# Patient Record
Sex: Female | Born: 1980 | Race: White | Hispanic: No | Marital: Married | Smoking: Never smoker
Health system: Southern US, Community
[De-identification: ages and names within clinical notes are randomized; demographics above are authoritative.]

## PROBLEM LIST (undated history)

## (undated) ENCOUNTER — Inpatient Hospital Stay (HOSPITAL_COMMUNITY): Payer: Self-pay

## (undated) DIAGNOSIS — N200 Calculus of kidney: Secondary | ICD-10-CM

## (undated) DIAGNOSIS — F419 Anxiety disorder, unspecified: Secondary | ICD-10-CM

## (undated) DIAGNOSIS — Z8619 Personal history of other infectious and parasitic diseases: Secondary | ICD-10-CM

## (undated) DIAGNOSIS — O139 Gestational [pregnancy-induced] hypertension without significant proteinuria, unspecified trimester: Secondary | ICD-10-CM

## (undated) DIAGNOSIS — D649 Anemia, unspecified: Secondary | ICD-10-CM

## (undated) DIAGNOSIS — Z87442 Personal history of urinary calculi: Secondary | ICD-10-CM

## (undated) DIAGNOSIS — F32A Depression, unspecified: Secondary | ICD-10-CM

## (undated) DIAGNOSIS — H9192 Unspecified hearing loss, left ear: Secondary | ICD-10-CM

## (undated) DIAGNOSIS — I209 Angina pectoris, unspecified: Secondary | ICD-10-CM

## (undated) DIAGNOSIS — U071 COVID-19: Secondary | ICD-10-CM

## (undated) DIAGNOSIS — O24419 Gestational diabetes mellitus in pregnancy, unspecified control: Secondary | ICD-10-CM

## (undated) HISTORY — DX: Anxiety disorder, unspecified: F41.9

## (undated) HISTORY — DX: Gestational (pregnancy-induced) hypertension without significant proteinuria, unspecified trimester: O13.9

## (undated) HISTORY — PX: WISDOM TOOTH EXTRACTION: SHX21

## (undated) HISTORY — DX: Calculus of kidney: N20.0

## (undated) HISTORY — DX: Personal history of other infectious and parasitic diseases: Z86.19

## (undated) HISTORY — DX: Gestational diabetes mellitus in pregnancy, unspecified control: O24.419

---

## 2002-09-29 ENCOUNTER — Emergency Department (HOSPITAL_COMMUNITY): Admission: EM | Admit: 2002-09-29 | Discharge: 2002-09-29 | Payer: Self-pay | Admitting: Emergency Medicine

## 2002-09-29 ENCOUNTER — Encounter: Payer: Self-pay | Admitting: Emergency Medicine

## 2002-12-27 ENCOUNTER — Emergency Department (HOSPITAL_COMMUNITY): Admission: EM | Admit: 2002-12-27 | Discharge: 2002-12-27 | Payer: Self-pay | Admitting: Emergency Medicine

## 2003-04-04 ENCOUNTER — Emergency Department (HOSPITAL_COMMUNITY): Admission: EM | Admit: 2003-04-04 | Discharge: 2003-04-04 | Payer: Self-pay | Admitting: Emergency Medicine

## 2003-04-04 ENCOUNTER — Encounter: Payer: Self-pay | Admitting: Emergency Medicine

## 2004-08-01 ENCOUNTER — Inpatient Hospital Stay (HOSPITAL_COMMUNITY): Admission: AD | Admit: 2004-08-01 | Discharge: 2004-08-03 | Payer: Self-pay | Admitting: Gynecology

## 2004-10-31 ENCOUNTER — Ambulatory Visit (HOSPITAL_COMMUNITY): Admission: RE | Admit: 2004-10-31 | Discharge: 2004-10-31 | Payer: Self-pay | Admitting: Gynecology

## 2005-01-06 ENCOUNTER — Encounter: Admission: RE | Admit: 2005-01-06 | Discharge: 2005-01-06 | Payer: Self-pay | Admitting: Gynecology

## 2005-01-29 ENCOUNTER — Observation Stay (HOSPITAL_COMMUNITY): Admission: AD | Admit: 2005-01-29 | Discharge: 2005-01-29 | Payer: Self-pay | Admitting: Gynecology

## 2005-01-29 ENCOUNTER — Ambulatory Visit (HOSPITAL_COMMUNITY): Admission: RE | Admit: 2005-01-29 | Discharge: 2005-01-29 | Payer: Self-pay | Admitting: Gynecology

## 2005-01-30 ENCOUNTER — Inpatient Hospital Stay (HOSPITAL_COMMUNITY): Admission: AD | Admit: 2005-01-30 | Discharge: 2005-02-02 | Payer: Self-pay | Admitting: Gynecology

## 2006-08-19 ENCOUNTER — Ambulatory Visit: Payer: Self-pay | Admitting: Obstetrics & Gynecology

## 2006-08-23 ENCOUNTER — Ambulatory Visit (HOSPITAL_COMMUNITY): Admission: RE | Admit: 2006-08-23 | Discharge: 2006-08-23 | Payer: Self-pay | Admitting: Gynecology

## 2006-09-06 ENCOUNTER — Ambulatory Visit: Payer: Self-pay | Admitting: Gynecology

## 2006-09-13 ENCOUNTER — Encounter (INDEPENDENT_AMBULATORY_CARE_PROVIDER_SITE_OTHER): Payer: Self-pay | Admitting: Gynecology

## 2006-09-13 ENCOUNTER — Ambulatory Visit: Payer: Self-pay | Admitting: Gynecology

## 2006-10-05 ENCOUNTER — Ambulatory Visit: Payer: Self-pay | Admitting: Family Medicine

## 2006-10-19 DIAGNOSIS — O24419 Gestational diabetes mellitus in pregnancy, unspecified control: Secondary | ICD-10-CM

## 2006-10-19 HISTORY — DX: Gestational diabetes mellitus in pregnancy, unspecified control: O24.419

## 2006-11-02 ENCOUNTER — Ambulatory Visit: Payer: Self-pay | Admitting: Family Medicine

## 2006-11-16 ENCOUNTER — Ambulatory Visit: Payer: Self-pay | Admitting: Family Medicine

## 2006-11-22 ENCOUNTER — Ambulatory Visit: Payer: Self-pay | Admitting: Obstetrics & Gynecology

## 2006-11-23 ENCOUNTER — Ambulatory Visit (HOSPITAL_COMMUNITY): Admission: RE | Admit: 2006-11-23 | Discharge: 2006-11-23 | Payer: Self-pay | Admitting: Gynecology

## 2006-12-01 ENCOUNTER — Ambulatory Visit: Payer: Self-pay | Admitting: Gynecology

## 2006-12-21 ENCOUNTER — Ambulatory Visit: Payer: Self-pay | Admitting: Family Medicine

## 2006-12-28 ENCOUNTER — Ambulatory Visit: Payer: Self-pay | Admitting: Family Medicine

## 2007-01-11 ENCOUNTER — Ambulatory Visit: Payer: Self-pay | Admitting: Family Medicine

## 2007-02-03 ENCOUNTER — Ambulatory Visit: Payer: Self-pay | Admitting: Family Medicine

## 2007-02-08 ENCOUNTER — Ambulatory Visit (HOSPITAL_COMMUNITY): Admission: RE | Admit: 2007-02-08 | Discharge: 2007-02-08 | Payer: Self-pay | Admitting: Family Medicine

## 2007-02-10 ENCOUNTER — Ambulatory Visit: Payer: Self-pay | Admitting: Obstetrics & Gynecology

## 2007-02-21 ENCOUNTER — Ambulatory Visit: Payer: Self-pay | Admitting: Gynecology

## 2007-02-21 ENCOUNTER — Ambulatory Visit: Payer: Self-pay | Admitting: Obstetrics and Gynecology

## 2007-03-01 ENCOUNTER — Ambulatory Visit: Payer: Self-pay | Admitting: Family Medicine

## 2007-03-04 ENCOUNTER — Ambulatory Visit: Payer: Self-pay | Admitting: Family Medicine

## 2007-03-08 ENCOUNTER — Ambulatory Visit: Payer: Self-pay | Admitting: Family Medicine

## 2007-03-11 ENCOUNTER — Inpatient Hospital Stay (HOSPITAL_COMMUNITY): Admission: AD | Admit: 2007-03-11 | Discharge: 2007-03-11 | Payer: Self-pay | Admitting: Gynecology

## 2007-03-11 ENCOUNTER — Ambulatory Visit: Payer: Self-pay | Admitting: Obstetrics and Gynecology

## 2007-03-15 ENCOUNTER — Ambulatory Visit: Payer: Self-pay | Admitting: Family Medicine

## 2007-03-18 ENCOUNTER — Ambulatory Visit: Payer: Self-pay | Admitting: Obstetrics and Gynecology

## 2007-03-22 ENCOUNTER — Ambulatory Visit: Payer: Self-pay | Admitting: Family Medicine

## 2007-03-25 ENCOUNTER — Ambulatory Visit: Payer: Self-pay | Admitting: Obstetrics and Gynecology

## 2007-03-29 ENCOUNTER — Ambulatory Visit: Payer: Self-pay | Admitting: Family Medicine

## 2007-03-31 ENCOUNTER — Ambulatory Visit: Payer: Self-pay | Admitting: Family Medicine

## 2007-04-01 ENCOUNTER — Ambulatory Visit (HOSPITAL_COMMUNITY): Admission: RE | Admit: 2007-04-01 | Discharge: 2007-04-01 | Payer: Self-pay | Admitting: Gynecology

## 2007-04-01 ENCOUNTER — Ambulatory Visit: Payer: Self-pay | Admitting: Obstetrics & Gynecology

## 2007-04-04 ENCOUNTER — Ambulatory Visit: Payer: Self-pay | Admitting: Gynecology

## 2007-04-08 ENCOUNTER — Ambulatory Visit (HOSPITAL_COMMUNITY): Admission: RE | Admit: 2007-04-08 | Discharge: 2007-04-08 | Payer: Self-pay | Admitting: Family Medicine

## 2007-04-12 ENCOUNTER — Ambulatory Visit: Payer: Self-pay | Admitting: Family Medicine

## 2007-04-15 ENCOUNTER — Inpatient Hospital Stay (HOSPITAL_COMMUNITY): Admission: AD | Admit: 2007-04-15 | Discharge: 2007-04-16 | Payer: Self-pay | Admitting: Family Medicine

## 2007-04-18 ENCOUNTER — Ambulatory Visit (HOSPITAL_COMMUNITY): Admission: RE | Admit: 2007-04-18 | Discharge: 2007-04-18 | Payer: Self-pay | Admitting: Family Medicine

## 2007-04-19 ENCOUNTER — Inpatient Hospital Stay (HOSPITAL_COMMUNITY): Admission: AD | Admit: 2007-04-19 | Discharge: 2007-04-22 | Payer: Self-pay | Admitting: Family Medicine

## 2007-04-19 ENCOUNTER — Ambulatory Visit: Payer: Self-pay | Admitting: Family Medicine

## 2007-04-19 ENCOUNTER — Ambulatory Visit: Payer: Self-pay | Admitting: Physician Assistant

## 2007-04-25 ENCOUNTER — Encounter: Admission: RE | Admit: 2007-04-25 | Discharge: 2007-05-25 | Payer: Self-pay | Admitting: Family Medicine

## 2007-05-26 ENCOUNTER — Encounter: Admission: RE | Admit: 2007-05-26 | Discharge: 2007-06-25 | Payer: Self-pay | Admitting: Family Medicine

## 2007-05-31 ENCOUNTER — Ambulatory Visit: Payer: Self-pay | Admitting: Family Medicine

## 2007-06-26 ENCOUNTER — Encounter: Admission: RE | Admit: 2007-06-26 | Discharge: 2007-07-08 | Payer: Self-pay | Admitting: Family Medicine

## 2007-07-28 ENCOUNTER — Emergency Department (HOSPITAL_COMMUNITY): Admission: EM | Admit: 2007-07-28 | Discharge: 2007-07-28 | Payer: Self-pay | Admitting: Emergency Medicine

## 2007-07-28 ENCOUNTER — Emergency Department (HOSPITAL_COMMUNITY): Admission: EM | Admit: 2007-07-28 | Discharge: 2007-07-29 | Payer: Self-pay | Admitting: Emergency Medicine

## 2007-07-31 ENCOUNTER — Emergency Department (HOSPITAL_COMMUNITY): Admission: EM | Admit: 2007-07-31 | Discharge: 2007-08-01 | Payer: Self-pay | Admitting: Emergency Medicine

## 2007-08-01 ENCOUNTER — Ambulatory Visit: Payer: Self-pay | Admitting: Gastroenterology

## 2007-08-02 ENCOUNTER — Ambulatory Visit: Payer: Self-pay | Admitting: Gastroenterology

## 2007-08-08 ENCOUNTER — Ambulatory Visit (HOSPITAL_COMMUNITY): Admission: RE | Admit: 2007-08-08 | Discharge: 2007-08-08 | Payer: Self-pay | Admitting: Gastroenterology

## 2007-12-23 DIAGNOSIS — K219 Gastro-esophageal reflux disease without esophagitis: Secondary | ICD-10-CM | POA: Insufficient documentation

## 2007-12-23 DIAGNOSIS — K8020 Calculus of gallbladder without cholecystitis without obstruction: Secondary | ICD-10-CM | POA: Insufficient documentation

## 2007-12-23 DIAGNOSIS — I10 Essential (primary) hypertension: Secondary | ICD-10-CM | POA: Insufficient documentation

## 2007-12-23 DIAGNOSIS — E669 Obesity, unspecified: Secondary | ICD-10-CM

## 2007-12-23 DIAGNOSIS — N200 Calculus of kidney: Secondary | ICD-10-CM | POA: Insufficient documentation

## 2007-12-23 DIAGNOSIS — N281 Cyst of kidney, acquired: Secondary | ICD-10-CM | POA: Insufficient documentation

## 2007-12-23 DIAGNOSIS — F411 Generalized anxiety disorder: Secondary | ICD-10-CM | POA: Insufficient documentation

## 2007-12-28 ENCOUNTER — Ambulatory Visit: Payer: Self-pay | Admitting: Gynecology

## 2008-01-03 ENCOUNTER — Ambulatory Visit (HOSPITAL_COMMUNITY): Admission: RE | Admit: 2008-01-03 | Discharge: 2008-01-03 | Payer: Self-pay | Admitting: Gynecology

## 2008-01-10 ENCOUNTER — Encounter: Payer: Self-pay | Admitting: Family Medicine

## 2008-01-10 ENCOUNTER — Ambulatory Visit: Payer: Self-pay | Admitting: Family Medicine

## 2008-01-12 ENCOUNTER — Ambulatory Visit: Payer: Self-pay | Admitting: Nurse Practitioner

## 2008-01-12 ENCOUNTER — Ambulatory Visit: Payer: Self-pay | Admitting: Gynecology

## 2008-01-12 ENCOUNTER — Observation Stay (HOSPITAL_COMMUNITY): Admission: AD | Admit: 2008-01-12 | Discharge: 2008-01-13 | Payer: Self-pay | Admitting: Obstetrics & Gynecology

## 2008-01-12 ENCOUNTER — Inpatient Hospital Stay (HOSPITAL_COMMUNITY): Admission: AD | Admit: 2008-01-12 | Discharge: 2008-01-12 | Payer: Self-pay | Admitting: Obstetrics and Gynecology

## 2008-01-13 ENCOUNTER — Encounter: Payer: Self-pay | Admitting: Gynecology

## 2008-01-17 ENCOUNTER — Ambulatory Visit (HOSPITAL_COMMUNITY): Admission: RE | Admit: 2008-01-17 | Discharge: 2008-01-17 | Payer: Self-pay | Admitting: Family Medicine

## 2008-01-24 ENCOUNTER — Ambulatory Visit: Payer: Self-pay | Admitting: Family Medicine

## 2008-02-02 ENCOUNTER — Ambulatory Visit (HOSPITAL_COMMUNITY): Admission: RE | Admit: 2008-02-02 | Discharge: 2008-02-02 | Payer: Self-pay | Admitting: Gynecology

## 2008-02-06 ENCOUNTER — Ambulatory Visit: Payer: Self-pay | Admitting: Gynecology

## 2008-02-27 ENCOUNTER — Ambulatory Visit: Payer: Self-pay | Admitting: Gynecology

## 2008-04-03 ENCOUNTER — Ambulatory Visit: Payer: Self-pay | Admitting: Obstetrics and Gynecology

## 2008-05-21 ENCOUNTER — Ambulatory Visit: Payer: Self-pay | Admitting: Family Medicine

## 2008-06-05 ENCOUNTER — Ambulatory Visit (HOSPITAL_COMMUNITY): Admission: RE | Admit: 2008-06-05 | Discharge: 2008-06-05 | Payer: Self-pay | Admitting: Family Medicine

## 2008-06-20 ENCOUNTER — Ambulatory Visit: Payer: Self-pay | Admitting: Obstetrics and Gynecology

## 2008-07-13 ENCOUNTER — Ambulatory Visit (HOSPITAL_COMMUNITY): Admission: RE | Admit: 2008-07-13 | Discharge: 2008-07-13 | Payer: Self-pay | Admitting: Family Medicine

## 2008-07-16 ENCOUNTER — Ambulatory Visit: Payer: Self-pay | Admitting: Family Medicine

## 2008-08-06 ENCOUNTER — Ambulatory Visit: Payer: Self-pay | Admitting: Family Medicine

## 2008-08-06 LAB — CONVERTED CEMR LAB: TSH: 0.902 microintl units/mL (ref 0.350–4.50)

## 2008-08-13 ENCOUNTER — Ambulatory Visit (HOSPITAL_COMMUNITY): Admission: RE | Admit: 2008-08-13 | Discharge: 2008-08-13 | Payer: Self-pay | Admitting: Family Medicine

## 2008-08-28 ENCOUNTER — Ambulatory Visit: Payer: Self-pay | Admitting: Family Medicine

## 2008-09-11 ENCOUNTER — Ambulatory Visit (HOSPITAL_COMMUNITY): Admission: RE | Admit: 2008-09-11 | Discharge: 2008-09-11 | Payer: Self-pay | Admitting: Obstetrics & Gynecology

## 2008-09-19 ENCOUNTER — Encounter: Payer: Self-pay | Admitting: Family Medicine

## 2008-09-19 ENCOUNTER — Ambulatory Visit: Payer: Self-pay | Admitting: Obstetrics and Gynecology

## 2008-09-19 LAB — CONVERTED CEMR LAB
HCT: 35.7 % — ABNORMAL LOW (ref 36.0–46.0)
Hemoglobin: 11.7 g/dL — ABNORMAL LOW (ref 12.0–15.0)
MCHC: 32.8 g/dL (ref 30.0–36.0)
MCV: 89.9 fL (ref 78.0–100.0)
Platelets: 201 10*3/uL (ref 150–400)
RBC: 3.97 M/uL (ref 3.87–5.11)
RDW: 13.6 % (ref 11.5–15.5)
WBC: 8.6 10*3/uL (ref 4.0–10.5)

## 2008-10-04 ENCOUNTER — Ambulatory Visit: Payer: Self-pay | Admitting: Family Medicine

## 2008-10-09 ENCOUNTER — Ambulatory Visit (HOSPITAL_COMMUNITY): Admission: RE | Admit: 2008-10-09 | Discharge: 2008-10-09 | Payer: Self-pay | Admitting: Obstetrics & Gynecology

## 2008-10-09 ENCOUNTER — Ambulatory Visit: Payer: Self-pay | Admitting: Obstetrics and Gynecology

## 2008-10-15 ENCOUNTER — Ambulatory Visit: Payer: Self-pay | Admitting: Family Medicine

## 2008-10-18 ENCOUNTER — Ambulatory Visit (HOSPITAL_COMMUNITY): Admission: RE | Admit: 2008-10-18 | Discharge: 2008-10-18 | Payer: Self-pay | Admitting: Family Medicine

## 2008-10-22 ENCOUNTER — Ambulatory Visit: Payer: Self-pay | Admitting: Obstetrics & Gynecology

## 2008-10-25 ENCOUNTER — Ambulatory Visit (HOSPITAL_COMMUNITY): Admission: RE | Admit: 2008-10-25 | Discharge: 2008-10-25 | Payer: Self-pay | Admitting: Family Medicine

## 2008-10-29 ENCOUNTER — Ambulatory Visit: Payer: Self-pay | Admitting: Obstetrics & Gynecology

## 2008-11-01 ENCOUNTER — Ambulatory Visit (HOSPITAL_COMMUNITY): Admission: RE | Admit: 2008-11-01 | Discharge: 2008-11-01 | Payer: Self-pay | Admitting: Family Medicine

## 2008-11-06 ENCOUNTER — Ambulatory Visit: Payer: Self-pay | Admitting: Obstetrics and Gynecology

## 2008-11-09 ENCOUNTER — Ambulatory Visit (HOSPITAL_COMMUNITY): Admission: RE | Admit: 2008-11-09 | Discharge: 2008-11-09 | Payer: Self-pay | Admitting: Family Medicine

## 2008-11-12 ENCOUNTER — Ambulatory Visit: Payer: Self-pay | Admitting: Obstetrics & Gynecology

## 2008-11-12 ENCOUNTER — Encounter: Payer: Self-pay | Admitting: Family Medicine

## 2008-11-12 ENCOUNTER — Inpatient Hospital Stay (HOSPITAL_COMMUNITY): Admission: AD | Admit: 2008-11-12 | Discharge: 2008-11-12 | Payer: Self-pay | Admitting: Obstetrics & Gynecology

## 2008-11-12 LAB — CONVERTED CEMR LAB
Chlamydia, DNA Probe: NEGATIVE
GC Probe Amp, Genital: NEGATIVE

## 2008-11-13 ENCOUNTER — Encounter: Payer: Self-pay | Admitting: Family Medicine

## 2008-11-15 ENCOUNTER — Ambulatory Visit (HOSPITAL_COMMUNITY): Admission: RE | Admit: 2008-11-15 | Discharge: 2008-11-15 | Payer: Self-pay | Admitting: Family Medicine

## 2008-11-16 ENCOUNTER — Ambulatory Visit: Payer: Self-pay | Admitting: Family Medicine

## 2008-11-19 ENCOUNTER — Ambulatory Visit: Payer: Self-pay | Admitting: Obstetrics and Gynecology

## 2008-11-20 ENCOUNTER — Encounter: Payer: Self-pay | Admitting: Family Medicine

## 2008-11-22 ENCOUNTER — Ambulatory Visit (HOSPITAL_COMMUNITY): Admission: RE | Admit: 2008-11-22 | Discharge: 2008-11-22 | Payer: Self-pay | Admitting: Family Medicine

## 2008-11-26 ENCOUNTER — Ambulatory Visit: Payer: Self-pay | Admitting: Family Medicine

## 2008-11-27 ENCOUNTER — Encounter: Payer: Self-pay | Admitting: Family Medicine

## 2008-11-27 LAB — CONVERTED CEMR LAB
ALT: 38 units/L — ABNORMAL HIGH (ref 0–35)
AST: 27 units/L (ref 0–37)
Albumin: 3.5 g/dL (ref 3.5–5.2)
Alkaline Phosphatase: 74 units/L (ref 39–117)
BUN: 9 mg/dL (ref 6–23)
CO2: 22 meq/L (ref 19–32)
Calcium: 9.3 mg/dL (ref 8.4–10.5)
Chloride: 108 meq/L (ref 96–112)
Creatinine, Ser: 0.6 mg/dL (ref 0.40–1.20)
Glucose, Bld: 67 mg/dL — ABNORMAL LOW (ref 70–99)
HCT: 35.2 % — ABNORMAL LOW (ref 36.0–46.0)
Hemoglobin: 11.1 g/dL — ABNORMAL LOW (ref 12.0–15.0)
MCHC: 31.5 g/dL (ref 30.0–36.0)
MCV: 88.4 fL (ref 78.0–100.0)
Platelets: 215 10*3/uL (ref 150–400)
Potassium: 4.3 meq/L (ref 3.5–5.3)
RBC: 3.98 M/uL (ref 3.87–5.11)
RDW: 13.8 % (ref 11.5–15.5)
Sodium: 140 meq/L (ref 135–145)
Total Bilirubin: 0.3 mg/dL (ref 0.3–1.2)
Total Protein: 5.9 g/dL — ABNORMAL LOW (ref 6.0–8.3)
WBC: 8.5 10*3/uL (ref 4.0–10.5)

## 2008-11-28 ENCOUNTER — Encounter: Payer: Self-pay | Admitting: Family Medicine

## 2008-11-28 LAB — CONVERTED CEMR LAB
Collection Interval-CRCL: 24 hr
Creatinine 24 HR UR: 1923 mg/24hr — ABNORMAL HIGH (ref 700–1800)
Creatinine Clearance: 223 mL/min — ABNORMAL HIGH (ref 75–115)
Creatinine, Urine: 153.8 mg/dL
Protein, Ur: 75 mg/24hr (ref 50–100)

## 2008-11-29 ENCOUNTER — Inpatient Hospital Stay (HOSPITAL_COMMUNITY): Admission: AD | Admit: 2008-11-29 | Discharge: 2008-11-29 | Payer: Self-pay | Admitting: Obstetrics & Gynecology

## 2008-11-29 ENCOUNTER — Ambulatory Visit: Payer: Self-pay | Admitting: Advanced Practice Midwife

## 2008-12-03 ENCOUNTER — Ambulatory Visit: Payer: Self-pay | Admitting: Obstetrics & Gynecology

## 2008-12-05 ENCOUNTER — Inpatient Hospital Stay (HOSPITAL_COMMUNITY): Admission: RE | Admit: 2008-12-05 | Discharge: 2008-12-06 | Payer: Self-pay | Admitting: Obstetrics & Gynecology

## 2008-12-05 ENCOUNTER — Ambulatory Visit: Payer: Self-pay | Admitting: Physician Assistant

## 2009-02-05 ENCOUNTER — Encounter: Payer: Self-pay | Admitting: Obstetrics & Gynecology

## 2009-02-05 ENCOUNTER — Ambulatory Visit: Payer: Self-pay | Admitting: Obstetrics & Gynecology

## 2009-02-18 ENCOUNTER — Ambulatory Visit: Payer: Self-pay | Admitting: Obstetrics & Gynecology

## 2009-02-18 ENCOUNTER — Encounter: Payer: Self-pay | Admitting: Family Medicine

## 2009-02-18 LAB — CONVERTED CEMR LAB
Glucose, 2 hour: 93 mg/dL (ref 70–139)
Glucose, Fasting: 90 mg/dL (ref 70–99)

## 2009-08-26 ENCOUNTER — Ambulatory Visit: Payer: Self-pay | Admitting: Family Medicine

## 2010-06-15 ENCOUNTER — Emergency Department (HOSPITAL_COMMUNITY): Admission: EM | Admit: 2010-06-15 | Discharge: 2010-06-15 | Payer: Self-pay | Admitting: Emergency Medicine

## 2010-10-28 ENCOUNTER — Emergency Department (HOSPITAL_COMMUNITY)
Admission: EM | Admit: 2010-10-28 | Discharge: 2010-10-28 | Payer: Self-pay | Source: Home / Self Care | Admitting: Emergency Medicine

## 2010-11-03 LAB — URINALYSIS, ROUTINE W REFLEX MICROSCOPIC
Bilirubin Urine: NEGATIVE
Ketones, ur: NEGATIVE mg/dL
Leukocytes, UA: NEGATIVE
Nitrite: NEGATIVE
Protein, ur: NEGATIVE mg/dL
Specific Gravity, Urine: 1.021 (ref 1.005–1.030)
Urine Glucose, Fasting: NEGATIVE mg/dL
Urobilinogen, UA: 0.2 mg/dL (ref 0.0–1.0)
pH: 6 (ref 5.0–8.0)

## 2010-11-03 LAB — URINE MICROSCOPIC-ADD ON

## 2010-11-09 ENCOUNTER — Encounter: Payer: Self-pay | Admitting: Family Medicine

## 2010-11-09 ENCOUNTER — Encounter: Payer: Self-pay | Admitting: Gynecology

## 2010-12-17 ENCOUNTER — Ambulatory Visit (INDEPENDENT_AMBULATORY_CARE_PROVIDER_SITE_OTHER): Payer: BC Managed Care – PPO | Admitting: Obstetrics & Gynecology

## 2010-12-17 ENCOUNTER — Other Ambulatory Visit (HOSPITAL_COMMUNITY)
Admission: RE | Admit: 2010-12-17 | Discharge: 2010-12-17 | Disposition: A | Discharge status: Home / Self Care | Payer: BC Managed Care – PPO | Source: Ambulatory Visit | Attending: Family Medicine | Admitting: Family Medicine

## 2010-12-17 DIAGNOSIS — Z1272 Encounter for screening for malignant neoplasm of vagina: Secondary | ICD-10-CM

## 2010-12-17 DIAGNOSIS — Z01419 Encounter for gynecological examination (general) (routine) without abnormal findings: Secondary | ICD-10-CM

## 2010-12-17 DIAGNOSIS — N912 Amenorrhea, unspecified: Secondary | ICD-10-CM

## 2011-01-02 LAB — URINALYSIS, ROUTINE W REFLEX MICROSCOPIC
Bilirubin Urine: NEGATIVE
Glucose, UA: NEGATIVE mg/dL
Ketones, ur: NEGATIVE mg/dL
Leukocytes, UA: NEGATIVE
Nitrite: NEGATIVE
Protein, ur: NEGATIVE mg/dL
Specific Gravity, Urine: 1.01 (ref 1.005–1.030)
Urobilinogen, UA: 0.2 mg/dL (ref 0.0–1.0)
pH: 6 (ref 5.0–8.0)

## 2011-01-02 LAB — URINE MICROSCOPIC-ADD ON

## 2011-01-02 LAB — POCT PREGNANCY, URINE: Preg Test, Ur: NEGATIVE

## 2011-01-09 NOTE — Assessment & Plan Note (Signed)
NAMEALEXIUS, Brown NO.:  [id number]  MEDICAL RECORD NO.:  [id number]           PATIENT TYPE:  LOCATION:  [facility] at [facility]           FACILITY:  PHYSICIAN:  Tinnie Gens, MD        DATE OF BIRTH:  Dec 09, 1980  DATE OF SERVICE:  12/17/2010                                 CLINIC NOTE  HISTORY:  [name] comes in today for yearly exam.  She complains of a superficial breast lesion that seems to have gotten bigger and then went away, and this came up again and did drain some serous drainage, this was in there for approximately 2 months.  She thinks it is really like a boil.  No other significant complaints.  She did have nephrolithiasis in the past.  In the past several months, she passed 13 kidney stones.  She changed her diet and is working on weight loss and exercise.  She reports her anxiety level is high.  She has no real sadness.  She feels like her life is a bit out of control.  She is presently homeschooling her oldest child and has 2 other young children at home as well.  She is sort of undecided about more kids at this point and has time to start think about that.  She is presently on no medications and her blood pressure is doing well.  PAST MEDICAL HISTORY:  Significant for hypertension.  PAST SURGICAL HISTORY:  She had a C-section in the past.  OBSTETRICAL HISTORY:  She is a G4, P-2-1-1-3, who had a C-section for preeclampsia with her first baby at 1 weeks and then VBAC x2 her last two pregnancies.  Pregnancies were complicated by gestational diabetes and hypertension.  GYNECOLOGICAL HISTORY:  Essentially negative.  No history of abnormal Pap.  MEDICATIONS:  None.  ALLERGIES:  PHENERGAN, IV CONTRAST.  No latex allergy.  FAMILY HISTORY:  Essentially negative.  SOCIAL HISTORY:  No tobacco, alcohol, or drug use.  She was a previous ER nurse.  REVIEW OF SYSTEMS:  The 14-point review of systems reviewed.  She denies headache, vision  changes, nausea, vomiting, diarrhea, constipation, chest pain, shortness of breath, swelling of feet or ankles.  For the rest, please see HPI.  PHYSICAL EXAMINATION:  VITAL SIGNS:  Vitals are as noted in the chart. Blood pressure is 126/91. GENERAL:  She is an obese female, in no acute distress. HEENT:  Normocephalic, atraumatic.  Sclerae anicteric. NECK:  Supple.  Normal thyroid. LUNGS:  Clear bilaterally. CV:  Regular rate and rhythm, without gallops, rubs, or murmurs. ABDOMEN:  Soft, nontender, nondistended. EXTREMITIES:  No cyanosis, clubbing, or edema.  2+ distal pulses. BREASTS:  Symmetric with everted nipples.  No masses.  No supraclavicular or axillary adenopathy.  On the right breast, there is a healing what looked to be a boil, but there is no mass underneath it any more. GU:  Normal external female genitalia.  BUS normal.  Vagina is pink and rugated.  Cervix is parous without lesion.  Uterus is small, anteverted. No adnexal mass or tenderness, although exam is limited by body habitus.  IMPRESSION:  Gynecological exam with Pap.  PLAN:  Follow up as needed or  in a year.          ______________________________ Tinnie Gens, MD    TP/MEDQ  D:  12/17/2010  T:  12/18/2010  Job:  [id number]

## 2011-02-02 LAB — URINALYSIS, ROUTINE W REFLEX MICROSCOPIC
Bilirubin Urine: NEGATIVE
Glucose, UA: NEGATIVE mg/dL
Hgb urine dipstick: NEGATIVE
Ketones, ur: NEGATIVE mg/dL
Nitrite: NEGATIVE
Protein, ur: NEGATIVE mg/dL
Specific Gravity, Urine: >1.03 — ABNORMAL HIGH (ref 1.005–1.030)
Urobilinogen, UA: 0.2 mg/dL (ref 0.0–1.0)
pH: 6 (ref 5.0–8.0)

## 2011-02-02 LAB — COMPREHENSIVE METABOLIC PANEL
ALT: 38 U/L — ABNORMAL HIGH (ref 0–35)
AST: 24 U/L (ref 0–37)
Albumin: 2.8 g/dL — ABNORMAL LOW (ref 3.5–5.2)
Alkaline Phosphatase: 64 U/L (ref 39–117)
BUN: 8 mg/dL (ref 6–23)
CO2: 24 mEq/L (ref 19–32)
Calcium: 9 mg/dL (ref 8.4–10.5)
Chloride: 107 mEq/L (ref 96–112)
Creatinine, Ser: 0.55 mg/dL (ref 0.4–1.2)
GFR calc Af Amer: >60 mL/min (ref >60)
GFR calc non Af Amer: >60 mL/min (ref >60)
Glucose, Bld: 86 mg/dL (ref 70–99)
Potassium: 3.8 mEq/L (ref 3.5–5.1)
Sodium: 138 mEq/L (ref 135–145)
Total Bilirubin: 0.4 mg/dL (ref 0.3–1.2)
Total Protein: 6.1 g/dL (ref 6.0–8.3)

## 2011-02-02 LAB — CBC
HCT: 34 % — ABNORMAL LOW (ref 36.0–46.0)
Hemoglobin: 11.3 g/dL — ABNORMAL LOW (ref 12.0–15.0)
MCHC: 33.3 g/dL (ref 30.0–36.0)
MCV: 88.8 fL (ref 78.0–100.0)
Platelets: 215 K/uL (ref 150–400)
RBC: 3.83 MIL/uL — ABNORMAL LOW (ref 3.87–5.11)
RDW: 13.1 % (ref 11.5–15.5)
WBC: 8.5 K/uL (ref 4.0–10.5)

## 2011-02-03 LAB — COMPREHENSIVE METABOLIC PANEL
ALT: 34 U/L (ref 0–35)
ALT: 37 U/L — ABNORMAL HIGH (ref 0–35)
AST: 27 U/L (ref 0–37)
AST: 29 U/L (ref 0–37)
Albumin: 2.7 g/dL — ABNORMAL LOW (ref 3.5–5.2)
Albumin: 2.7 g/dL — ABNORMAL LOW (ref 3.5–5.2)
Alkaline Phosphatase: 77 U/L (ref 39–117)
Alkaline Phosphatase: 75 U/L (ref 39–117)
BUN: 7 mg/dL (ref 6–23)
BUN: 6 mg/dL (ref 6–23)
CO2: 22 mEq/L (ref 19–32)
CO2: 24 mEq/L (ref 19–32)
Calcium: 8.9 mg/dL (ref 8.4–10.5)
Calcium: 8.6 mg/dL (ref 8.4–10.5)
Chloride: 107 mEq/L (ref 96–112)
Chloride: 107 mEq/L (ref 96–112)
Creatinine, Ser: 0.6 mg/dL (ref 0.4–1.2)
Creatinine, Ser: 0.53 mg/dL (ref 0.4–1.2)
GFR calc Af Amer: >60 mL/min (ref >60)
GFR calc Af Amer: >60 mL/min (ref >60)
GFR calc non Af Amer: >60 mL/min (ref >60)
GFR calc non Af Amer: >60 mL/min (ref >60)
Glucose, Bld: 112 mg/dL — ABNORMAL HIGH (ref 70–99)
Glucose, Bld: 63 mg/dL — ABNORMAL LOW (ref 70–99)
Potassium: 3.6 mEq/L (ref 3.5–5.1)
Potassium: 3.7 mEq/L (ref 3.5–5.1)
Sodium: 136 mEq/L (ref 135–145)
Sodium: 136 mEq/L (ref 135–145)
Total Bilirubin: 0.4 mg/dL (ref 0.3–1.2)
Total Bilirubin: 0.4 mg/dL (ref 0.3–1.2)
Total Protein: 5.3 g/dL — ABNORMAL LOW (ref 6.0–8.3)
Total Protein: 5.3 g/dL — ABNORMAL LOW (ref 6.0–8.3)

## 2011-02-03 LAB — RPR: RPR Ser Ql: NONREACTIVE

## 2011-02-03 LAB — CBC
HCT: 32 % — ABNORMAL LOW (ref 36.0–46.0)
HCT: 33.7 % — ABNORMAL LOW (ref 36.0–46.0)
HCT: 34.7 % — ABNORMAL LOW (ref 36.0–46.0)
Hemoglobin: 11 g/dL — ABNORMAL LOW (ref 12.0–15.0)
Hemoglobin: 11.2 g/dL — ABNORMAL LOW (ref 12.0–15.0)
Hemoglobin: 11.6 g/dL — ABNORMAL LOW (ref 12.0–15.0)
MCHC: 34.4 g/dL (ref 30.0–36.0)
MCHC: 33.3 g/dL (ref 30.0–36.0)
MCHC: 33.4 g/dL (ref 30.0–36.0)
MCV: 87.7 fL (ref 78.0–100.0)
MCV: 88.6 fL (ref 78.0–100.0)
MCV: 89 fL (ref 78.0–100.0)
Platelets: 190 10*3/uL (ref 150–400)
Platelets: 190 10*3/uL (ref 150–400)
Platelets: 206 10*3/uL (ref 150–400)
RBC: 3.65 MIL/uL — ABNORMAL LOW (ref 3.87–5.11)
RBC: 3.8 MIL/uL — ABNORMAL LOW (ref 3.87–5.11)
RBC: 3.9 MIL/uL (ref 3.87–5.11)
RDW: 13.6 % (ref 11.5–15.5)
RDW: 13.7 % (ref 11.5–15.5)
RDW: 13.9 % (ref 11.5–15.5)
WBC: 6.8 10*3/uL (ref 4.0–10.5)
WBC: 10.8 10*3/uL — ABNORMAL HIGH (ref 4.0–10.5)
WBC: 8.3 10*3/uL (ref 4.0–10.5)

## 2011-02-03 LAB — GLUCOSE, CAPILLARY
Glucose-Capillary: 105 mg/dL — ABNORMAL HIGH (ref 70–99)
Glucose-Capillary: 141 mg/dL — ABNORMAL HIGH (ref 70–99)
Glucose-Capillary: 75 mg/dL (ref 70–99)
Glucose-Capillary: 95 mg/dL (ref 70–99)

## 2011-02-03 LAB — URIC ACID
Uric Acid, Serum: 4 mg/dL (ref 2.4–7.0)
Uric Acid, Serum: 4.4 mg/dL (ref 2.4–7.0)

## 2011-02-03 LAB — LACTATE DEHYDROGENASE
LDH: 114 U/L (ref 94–250)
LDH: 112 U/L (ref 94–250)

## 2011-03-03 NOTE — Assessment & Plan Note (Signed)
NAMEBETZABE, BEVANS NO.:  [id number]   MEDICAL RECORD NO.:  [id number]          PATIENT TYPE:  POB   LOCATION:  [facility] at [facility]         FACILITY:  [facility]   PHYSICIAN:  Ginger Carne, MD DATE OF BIRTH:  [birth year]   DATE OF SERVICE:  02/06/2008                                  CLINIC NOTE   The patient presents today with a missed abortion on February 03, 2008.  She has an 8-week 5-day failed gestation.  She has an intrauterine  gestational sac measuring 8 weeks 0 days with no yolk sac or fetal pole  identified.  She was provided the option of either sitting tight and  having a spontaneous abortion, having a dilatation and curettage, and/or  using Cytotec.  She opted for the latter.  She is B+.  She understands  that if she has heavy bleeding that does not stop within a week or 2,  she may require a dilation and curettage.   PLAN:  Cytotec 800 mcg intravaginal, ibuprofen 800 mg up to 3 times  daily as needed, and Vicodin as needed for pain.  The patient will be  rechecked in the office in 2 weeks and p.r.n. as needed.           ______________________________  Ginger Carne, MD     SHB/MEDQ  D:  02/06/2008  T:  02/06/2008  Job:  [id number]

## 2011-03-03 NOTE — Op Note (Signed)
[name], [name]               ACCOUNT NO.:  [id number]   MEDICAL RECORD NO.:  [id number]          PATIENT TYPE:  INP   LOCATION:                                FACILITY:  [facility]   PHYSICIAN:  Ginger Carne, MD  DATE OF BIRTH:  07-10-1981   DATE OF PROCEDURE:  DATE OF DISCHARGE:                               OPERATIVE REPORT   REASON FOR HOSPITALIZATION:  1. 38-4/[redacted] weeks gestation.  2. Chronic hypertension.  3. Previous cesarean section.  4. For induction.   IN HOSPITAL PROCEDURES:  Pitocin, ripening of cervix.   FINAL DIAGNOSIS:  Uninducible cervix and same as above diagnosis.   HOSPITAL COURSE:  This is the 30 year old, gravida 2, para 0-1-0-1,  Caucasian female who presents at 38-4/[redacted] weeks gestation scheduled for  induction because of history of chronic hypertension. The patient is a  previous low transverse cesarean section.  The patient is morbidly  obese, weighing over 300 pounds.  The patient was admitted and started  on a Pitocin protocol for ripening.  Upon evaluating the patient in the  morning of March 27, 2007, it was noted that her cervix was 2-3 cm, long,  closed, thick and posterior with an unengaged presentation.  Pitocin was  continued all day and discontinued around 6:30 in the evening because of  no change in cervical status.  The patient is interested in having a  vaginal birth, but understands that due to the nature of the cervix,  this may not be possible. It was explained to the patient that in the  face of a previous cesarean section the safety of doing so and risk of  rupture would precluded further induction unless her cervix was  significantly favorable.  A biophysical profile was 8/8 however her AFI  was 7.5 which is diminished from previous AFI of about 12.  The former  AFI was performed about a week ago.  The patient will return therefore  in two days to have a follow-up biophysical profile, be checked on  Tuesday for an obstetrical antenatal  checkup.  Depending on her blood  pressure parameters and fetal status based on nonstress testing and  biophysical profile including AFI evaluation, the patient has understood  that it may not be possible to attempt a further induction.  If she is  uninducible, she will undergo a cesarean section at the latest. She will  go no further than April 26, 2007. Tentatively an abduction was scheduled  for that date, however, she will be checked the day before assuming that  all aforementioned parameters are within normal limits.  If she is  uninducible she will then undergo a cesarean section on the July 8  instead of facing an induction for an unfavorable cervix.  The patient  has verbalized understanding of the same. She will be discharged on her  home medications including labetalol 200 mg twice a day and prenatal  vitamins.      Ginger Carne, MD  Electronically Signed     SHB/MEDQ  D:  04/16/2007  T:  04/17/2007  Job:  [id number]

## 2011-03-03 NOTE — Assessment & Plan Note (Signed)
[name], [name]               ACCOUNT NO.:  [id number]   MEDICAL RECORD NO.:  [id number]          PATIENT TYPE:  POB   LOCATION:  [facility] at [facility]         FACILITY:  [facility]   PHYSICIAN:  Tinnie Gens, MD        DATE OF BIRTH:  05-03-1981   DATE OF SERVICE:  05/31/2007                                  CLINIC NOTE   CHIEF COMPLAINT:  Postpartum check.   HISTORY OF PRESENT ILLNESS:  The patient is a 30 year old gravida 2,  para 2, who is 6 weeks postpartum from a vaginal birth after cesarean  section of a term infant who was approximately 7 pounds.  The baby is  sleeping through the night except to wake up twice for eating. Is  otherwise well behaved.  The patient reports decreased mood.  She is  tearful at times, does not feel like she is acting like herself. She has  been on antidepressants before and wonders if would not help her.  Her  last Pap was 09/13/2006.  She is unsure of birth control this time.  She  is going back to work in approximately 2 weeks.  She is otherwise  without significant complaint.   PHYSICAL EXAMINATION:  Blood pressure is 131/96, pulse is 85, weight  297.  She is an obese female in no acute distress.  GU:  Normal external female genitalia.  Vagina is pink and rugated.  Cervix is parous without lesion.  Uterus is small, anteverted.  No  adnexal mass or tenderness.   IMPRESSION:  Postpartum check, sufficient involution.   PLAN:  1. Follow-up Pap in November of 2008.  2. Continue breast-feeding.  3. Start Lexapro 10 mg p.o. daily.  May increase to 20 if not better.      Let us know if this medication does not work for her.  Follow up as      needed for contraception and other issues.  Recheck blood pressure      couple more times to see if the patient requires medication outside      of pregnancy.           ______________________________  Tinnie Gens, MD     TP/MEDQ  D:  05/31/2007  T:  06/01/2007  Job:  [id number]

## 2011-03-03 NOTE — Assessment & Plan Note (Signed)
[name], [name] NO.:  [id number]   MEDICAL RECORD NO.:  [id number]          PATIENT TYPE:  POB   LOCATION:  [facility] at [facility]         FACILITY:  [facility]   PHYSICIAN:  Ginger Carne, MD DATE OF BIRTH:  October 08, 1981   DATE OF SERVICE:  02/27/2008                                  CLINIC NOTE   This patient returns today following Cytotec induction on 02/06/08  because of an 8-week missed abortion diagnosed on 02/03/08.  There was  no yolk sac or fetal pole identified.  She was prescribed Cytotec 800  mcg intravaginal with ibuprofen and Vicodin as needed.  She bled heavily  for the first 5 days immediately following placement within 2 hours and  for the next 2 weeks.  She currently has no active bleeding, no pain and  no discomfort.  The patient is Rh positive.  The patient was advised to  try and avoid pregnancy for the next 6 months.  She was offered  contraceptive management including birth control pills, Mirena  intrauterine device and/or foam and condom. The patient has opted for  the latter. She was advised to contact our office if she does not have a  menses by the first week of 03/2008.  She typically has regular menses  every 30 days. Her blood pressure is 131/96 and was advised to follow up  with this.  She was recommended to consult the [facility] in [facility] which she will do.           ______________________________  Ginger Carne, MD     SHB/MEDQ  D:  02/27/2008  T:  02/27/2008  Job:  [id number]

## 2011-03-03 NOTE — Assessment & Plan Note (Signed)
[facility]                         GASTROENTEROLOGY OFFICE NOTE   DALANEY, NEEDLE                      MRN:          [id number]  DATE:08/01/2007                            DOB:          July 24, 1981    PHYSICIAN REQUESTING CONSULTATION:  Hilario Quarry, M.D.   REASON FOR CONSULTATION:  Epigastric pain.   HISTORY OF PRESENT ILLNESS:  [name] is a 30 year old white female who  is 3 months postpartum.  She relates reflux problems during her  pregnancy and she took Prilosec about every other day with good control  of her symptoms.  Since she has delivered, she has stopped Prilosec and  she felt well until about 6 days ago, when she developed the onset of  waxing and waning epigastric pain.  When the pain becomes severe, it is  incapacitating and radiates to her back.  She has noted anorexia but no  nausea, vomiting, diarrhea, constipation or change in bowel habits.  She  was seen in the emergency room on three separate occasions between  October 9 and October 12.  Blood work was all unremarkable and her  urinalysis has shown blood on two occasions, but she is currently having  her menstrual period.  She underwent a CT scan of the abdomen and pelvis  that showed a 4.4-mm low-density area in the right lobe of the liver  that was too small to characterizea and there was a 4.9-mm stone in the  lower pole of the right kidney without hydronephrosis or  ureterolithiasis.  There were no other abnormalities noted.  Abdominal  ultrasound imaging showed several nonmobile filling defects which  appeared to be adherent to the gallbladder wall.  Bilateral renal cysts  were also noted.  She states that both her husband and her 69-year-old  son have had an intestinal virus for the past 2-3 days with nausea,  vomiting and abdominal pain.  Her symptoms have been waxing and waning  for 6-7 days and the pain has been so severe, it has not been adequately  controlled  with Percocet or Vicodin.  It tends to be mild to moderate  during most of the day and tends to increase in severity in the late  afternoon.  She denies any aspirin or NSAID usage.  She has no nausea,  vomiting, hematemesis, melena, hematochezia, or change in stool caliber.  She has noted mild constipation since starting the use of narcotics for  pain control.   PAST MEDICAL HISTORY:  1. Hypertension.  2. GERD.  3. Anxiety.  4. Status post C-section.   CURRENT MEDICATIONS:  1. Celexa 20 mg daily.  2. Dilaudid 4mg  q6h p.r.n. pain   MEDICATION ALLERGIES:  INTRAVENOUS PHENERGAN, IVP DYE, and MOTRIN  leading to dyspepsia.   SOCIAL HISTORY:  Per the handwritten form.   REVIEW OF SYSTEMS:  Per the handwritten form.   PHYSICAL EXAM:  Obese white female in no acute distress.  Height 6 feet, weight 293 pounds.  Blood pressure is 100/66, pulse 76  and regular.  HEENT:  Anicteric sclerae.  Oropharynx clear.  CHEST:  Clear to  auscultation bilaterally.  CARDIAC:  Regular rate and rhythm without murmurs.  ABDOMEN:  Soft, nontender, nondistended, normoactive bowel sounds.  No  palpable organomegaly, masses or hernias.  BACK:  Without CVA or spinal tenderness.  EXTREMITIES:  Without clubbing, cyanosis, or edema.  NEUROLOGIC:  Alert and oriented x3.  Grossly nonfocal.   ASSESSMENT AND PLAN:  Recurrent epigastric pain radiating to the back.  Cholelithiasis versus gallbladder polyps.  Hematuria. Rule out  ureterolithiasis.  Her hematuria may be secondary to her menstrual  period.  I would like her to obtain a urinalysis once her period is  over.  Begin Zegerid 40 mg p.o. daily.  Schedule upper endoscopy to rule  out ulcer disease, gastritis, duodenitis, and gastroesophageal reflux  disease.  Risks, benefits, and alternatives to upper endoscopy with  possible biopsy discussed with the patient and she consents to proceed.  This will be scheduled electively.  If the upper endoscopy is not   diagnostic, consider proceeding with a [facility] hepatobiliary scan or  surgical referral for possible symptomatic cholelithiasis. Consider  Urology referral after her repeat urinalysis.     Venita Lick. Russella Dar, MD, [facility]  Electronically Signed    MTS/MedQ  DD: 08/01/2007  DT: 08/01/2007  Job #: [id number]

## 2011-03-03 NOTE — Assessment & Plan Note (Signed)
[name], [name]               ACCOUNT NO.:  [id number]   MEDICAL RECORD NO.:  [id number]          PATIENT TYPE:  POB   LOCATION:  [facility] at [facility]         FACILITY:  [facility]   PHYSICIAN:  Johnella Moloney, MD        DATE OF BIRTH:  April 04, 1981   DATE OF SERVICE:  02/05/2009                                  CLINIC NOTE   CHIEF COMPLAINT:  Postpartum examination and annual Pap smear.   HISTORY OF PRESENT ILLNESS:  The patient is a 30 year old gravida 4,  para 2-1-1-3 status post spontaneous vaginal delivery over intact  perineum on December 05, 2008.  The patient had a female infant that  weighed 7 pounds 2 ounces.  She had an uncomplicated postpartum course.  The patient is breast and bottle feeding, and denies any signs or  symptoms of depression.  Her husband provides a lot of support and  taking care of her baby, and she does not relate any concerns.  The  patient was given a prescription for Mircette for contraception, but she  did not use this.  She has been sexually active without any problems.  The patient is interested in discussing contraceptive methods at this  visit.  She reports that her last menstrual period was in January 17, 2009,  and denies any other gynecologic concerns.   PAST MEDICAL HISTORY:  Chronic hypertension, gestational diabetes,  nephrolithiasis, and obesity.   PAST SURGICAL HISTORY:  Cesarean section x1.  Of note, the patient's  last delivery was of virginal birth after cesarean section.   PAST OBSTETRICS AND GYNECOLOGIC HISTORY:  The patient has 1 preterm  delivery and 2 term delivery.  She also had 1 spontaneous abortion.  She  has not had regular menstrual periods prior to delivery with no  intermenstrual bleeding or any other problems.  She denies any history  of sexually transmitted infections.   MEDICATIONS:  Flintstones every day.  Sudafed as needed.   ALLERGIES:  IV PHENERGAN and IVP DYE.   SOCIAL HISTORY:  The patient lives with her family.   She denies any  smoking, alcohol, or illicit drug use.   FAMILY HISTORY:  Negative for any cancers.   PHYSICAL EXAMINATION:  VITAL SIGNS:  Blood pressure 121/94, pulse 92,  weight 283 pounds, and height 6 feet.  GENERAL:  No apparent distress.  ABDOMEN:  Soft, nontender, nondistended.  BREASTS:  Symmetric in size, nontender.  No abnormal drainage or masses.  No lymphadenopathy or skin changes. EXTREMITIES:  No clubbing, cyanosis,  or edema.  PELVIC:  Normal external female genitalia.  Pink, well rugated vagina.  Multiparous cervical os noted.  No abnormal drainage noted.  No lesions  noted.  On bimanual exam, small midline uterus, mobile.  Adnexa, not  able to be palpated secondary to patient's habitus.  Pap smear was  obtained.   ASSESSMENT AND PLAN:  The patient is a 31 year old gravida 4, para 2-1-1-  3 here for her postpartum check and Pap smear.  We will follow up  results of her Pap smear.  The patient was counseled regarding different  modalities of birth control including all hormonal methods and barrier  methods.  The patient is deciding between a Mirena IUD and NuvaRing.  The risks and benefits of both were discussed with the patient.  She  does not want anything permanent as of now and wants to proceed with an  NuvaRing trial.  She will continue to see if she wants to go with the  Mirena IUD.  The patient was told to let us know if she would wanted the  Mirena IUD.  She was given a sample and prescription for NuvaRing, and  will come back in 3 months for a blood pressure check.           ______________________________  Johnella Moloney, MD     UD/MEDQ  D:  02/05/2009  T:  02/06/2009  Job:  [id number]

## 2011-03-03 NOTE — Discharge Summary (Signed)
NAMEDONNESHA, [name]               ACCOUNT NO.:  [id number]   MEDICAL RECORD NO.:  [id number]          PATIENT TYPE:  OBV   LOCATION:  9303                          FACILITY:  [facility]   PHYSICIAN:  Ginger Carne, MD  DATE OF BIRTH:  Sep 23, 1981   DATE OF ADMISSION:  01/12/2008  DATE OF DISCHARGE:  01/13/2008                               DISCHARGE SUMMARY   REASON FOR HOSPITALIZATION:  1. Hematuria in first trimester  2. Right flank pain.   IN-HOSPITAL PROCEDURES:  1. Intravenous hydration.  2. MRI of abdomen and pelvis.   FINAL DIAGNOSIS:  Partial obstruction of right kidney.   HOSPITAL COURSE:  This patient is a 30 year old Caucasian female gravida  3, para 2-0-0-2, who is approximately 5 weeks in gestation, who  presented with worsening right flank pain with radiation around her  right lower quadrant on the evening of January 12, 2008.  The patient was  treated with intravenous hydration, and then an MRI demonstrated partial  obstructive pattern with perinephric stranding and periureteral  stranding consistent with ureteral stone.  The radiologist could not  actually visualize a stone, but all the findings were consistent with  same.  Her gallbladder appeared normal, and no other findings were  noted.  The patient was afebrile during her hospitalization, and her  vital signs were stable.  Her urine dipstick in the office demonstrated  blood; however, urinalysis obtained on January 12, 2008, at 0039 hours did  not demonstrate evidence of leukocytes or blood.  Her CBC on admission  was demonstrating a white count of 10.8 and H&H of 38.0 and 13.0.  This  was essentially unchanged.   The patient's symptoms improved during the course of her hospitalization  and hydration, and the patient preferred to be discharged after a  thorough explanation with the understanding that she may in fact be  passing a kidney stone or having passed one already.  She was prescribed  Dilaudid to be used  1 every 6-8 hours as needed for severe pain, but  encouraged to use Tylenol no more than 4 g a day if needed.  In  addition, Phenergan 1/2-1 tablet 25 mg every 6-8 hours was prescribed as  needed.  The patient was asked to call on Monday, January 16, 2008, for an  update as to her symptoms.  If she has not had significant relief of  such symptomatology, she will be referred to [facility].  She  understands it would be highly unlikely that any major procedures would  be performed at this stage of pregnancy including stenting or a  nephrostomy unless evidence of a pure obstructive pattern was noted.  She understands it may take up to 1-2 weeks for symptoms to totally  resolve, and they may recur.  I also encouraged to drink plenty of  fluids and remain well hydrated.  The patient was also asked to contact  the office for temperature elevation above 100.4 degrees Fahrenheit,  gross hematuria, worsening of right flank pain, or right lower quadrant  pain.  She will be followed up with a pelvic sonogram  on January 17, 2008.  Earlier scan did not demonstrate a fetal pole, and this will be followed  up to identify a yolk sac and fetal pole.  In addition, she has a  regular OB  appointment in approximately 2 weeks.  She states that she was advised  to resume labetalol, dose unknown, but has a prescription from our  office, and she will start this when she returns home today.  The  patient verbalized understanding of said instructions.      Ginger Carne, MD  Electronically Signed     SHB/MEDQ  D:  01/13/2008  T:  01/14/2008  Job:  [id number]   cc:   [name] Office

## 2011-03-06 NOTE — Discharge Summary (Signed)
NAMEILLEANA, EDICK               ACCOUNT NO.:  [id number]   MEDICAL RECORD NO.:  [id number]          PATIENT TYPE:  OBV   LOCATION:  9170                          [facility]ILITY[facility]   PHYSICIAN:  Ginger Carne, MD  DATE OF BIRTH:  [birth year]   DATE OF ADMISSION:  01/29/2005  DATE OF DISCHARGE:  01/29/2005                                 DISCHARGE SUMMARY   REASON FOR ADMISSION:  Elevated blood pressure at [redacted] weeks gestation with +1  proteinuria and headache.   FINAL DIAGNOSIS:  Gestational hypertension.   HOSPITAL COURSE:  This patient is a 30 year old gravida 1, para 0, [redacted] weeks  gestation by two ultrasounds first and second trimester, with an EDC of Mar 18, 2005, admitted after a three day course of a sustained blood pressure  elevation of 140 to 160 over 90 to 108.  The patient demonstrated no signs  of laboratory abnormalities related to [facility] labs on January 27, 2005, and had  no evidence for proteinuria until today at which time it was +1.  It was  questionable as to how much bedrest the patient has had at home and with a  one day headache which remained at a 3 out of 10, the patient was admitted  for evaluation.  She denied epigastric pain, right upper quadrant pain,  and/or blurred vision.  The patient at rest in bed demonstrated blood  pressure between 120 to 148 systolic and 70 to 94 diastolic with no evidence  of proteinuria during this hospital course.  Fetal heart rate tracing was  normal.  Her hemoglobin was 12.3, hematocrit 35.4, platelets 225,000, uric  acid 3.7.  Liver function tests normal.  Her headache remained sustained at  about 3 out of 10 but did not show signs of development of worsening of  headache, blurred vision, epigastric or right upper quadrant pain.  The  patient was given one dose of betamethasone 12 mg in anticipation of  possibility of worsening blood pressure and/or proteinuria and the need for  possible premature delivery prior to 34 weeks due to  severe pre-eclampsia or  sustained elevated blood pressures.  The patient agreed to ambulatory  bedrest at home and with said instructions of avoiding spending too much  time outside the house, it was agreed that the patient would return tomorrow  on January 30, 2005, for a blood pressure check, protein check, and a second  dose of betamethasone  in the maternity admission unit at approximately 2-3  o'clock in the afternoon.  In addition, the patient was advised to contact  the office if her headache worsens, blurred vision, right upper quadrant or  epigastric pain develops.  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  She will be seen in the office on Monday, April 17, for a follow up  visit.      SHB/MEDQ  D:  01/29/2005  T:  01/29/2005  Job:  [id number]

## 2011-03-06 NOTE — Discharge Summary (Signed)
[name], [name]               ACCOUNT NO.:  [id number]   MEDICAL RECORD NO.:  [id number]          PATIENT TYPE:  INP   LOCATION:  9307                          [facility]  [facility]   PHYSICIAN:  Ginger Carne, MD  DATE OF BIRTH:  1981/06/04   DATE OF ADMISSION:  08/01/2004  DATE OF DISCHARGE:                                 DISCHARGE SUMMARY   FINAL DIAGNOSIS:  Musculoskeletal discomfort at 7.[redacted] weeks gestation.   HISTORY OF PRESENT ILLNESS AND HOSPITAL COURSE:  This patient is a 30-year-  old gravida 1, para 48 Caucasian female who was admitted on the evening of  August 01, 2004 complaining of worsening bilateral lower back pain for  approximately 24 hours.  Evaluation in the office indicated negative  urinalysis.  The patient was empirically placed on Augmentin; however,  during the course of the day, discomfort worsened, and she was admitted for  evaluation on the evening of the 14th.  Pelvic sonogram and kidney  ultrasound revealed no evidence of pelvic masses, ureteral dilatation, or  other abnormalities.  Urinalysis was negative.  CBC was normal.  Urine  culture pending at this time.  She has remained afebrile throughout her  hospital course.  During her stay, she has had improvement by being at  bedrest and being on a Dilaudid PCA pump.  It is uncertain as to the  etiology of her musculoskeletal injury.  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   On the day of discharge, the patient was having mild nausea related to her  pregnancy, and her discomfort is still present in her back but less so  compared to her admission.   DISCHARGE MEDICATIONS:  1.  Dilaudid 2 mg, one to two every four to six hours, #50, no refills.  2.  Zofran 8 mg one every four to six hours as needed.   DISCHARGE INSTRUCTIONS:  She was placed on light duty for one week and asked  to be out of work for four days.   FOLLOW UP:  She will return in two weeks for followup and asked to call the  office if her pain worsens in her back or any other new symptoms develop.  Urine culture will be monitored when it is available.     Stev   SHB/MEDQ  D:  08/03/2004  T:  08/04/2004  Job:  [id number]

## 2011-03-06 NOTE — H&P (Signed)
[name], [name]               ACCOUNT NO.:  [id number]   MEDICAL RECORD NO.:  [id number]          PATIENT TYPE:  INP   LOCATION:  9169                          [facility]   PHYSICIAN:  Ginger Carne, MD  DATE OF BIRTH:  1981/06/24   DATE OF ADMISSION:  01/30/2005  DATE OF DISCHARGE:                                HISTORY & PHYSICAL   CHIEF COMPLAINT:  Severe pre-eclampsia with headache, right upper quadrant  pain, and epigastric pain.   HISTORY OF PRESENT ILLNESS:  This is a 30 year old gravida 1, para 0,  Caucasian female, EDC Mar 18, 2005, 33 plus weeks gestation, admitted  because of a persistent blood pressure elevation of 150/108 and 150/110,  taken in a sitting position.  The patient has been followed during the week  with persistently elevated blood pressures in the range of 130 to 150 over  80 to 95.  At the time, she had no proteinuria and demonstrated no pre-  eclampsia symptomatology.  The patient was seen in the MAU and monitored  throughout the day on January 29, 2005.  Her blood pressure was 138/108, +1  proteinuria in the office on that day, but the blood pressure did remain in  the 130 to 140 range systolic and 90 to 100 range diastolic with negative  proteinuria noted.  She was advised to rest, had no pre-eclampsia  symptomatology at the end of the day and received one course of  betamethasone on said admission.  On today's evaluation, the patient  received a second course of betamethasone, however, her blood pressure was  150/108 to 110 and demonstrated a worsening headache and right upper  quadrant as well as epigastric pain.  The patient had a slight headache  yesterday that was possibly related to sinusitis, however, she reacted from  a 2-3 yesterday to a 5 today with no improvement from Tylenol 1000 mg four  times a day.  The patient's PIH labs reveals platelet count 251,000, no  proteinuria, uric acid 3.3 which is in the normal range, normal liver  function tests.   The patient had an ultrasound in the first trimester confirming a date of  Mar 18, 2005.  Follow up ultrasound at 20 5/7 weeks at [facility] in  [place] confirms the date.  A scan obtained on April 13 to evaluate  growth in the face of pre-eclampsia revealed an estimated fetal weight  between 2200 and 2500 grams which was in the 80th to 90th percentile and  dates were consistent with May 31.  There was adequate fluid, good fetal  movement, vertex presentation.  AFI was 15.1.  The patient's prenatal care  has been complicated by gestational diabetes which has been managed by diet  alone.   PAST MEDICAL HISTORY:  None.   CURRENT MEDICATIONS:  Prenatal vitamins.   ALLERGIES:  Phenergan and IVP dye.   SOCIAL HISTORY:  Positive for smoking less than 1/4 pack cigarettes daily  and denies illicit drug abuse or alcohol abuse.   OB/GYN HISTORY:  Additional OB/GYN history includes the patient is B  positive, Rubella  immune, hepatitis screen negative.  She is also Varicella  nonimmune.  She declined an MSAFP multiple marker screen.  The patient was  diagnosed with gestational diabetes nd managed with diet and had  satisfactory blood sugars.   PAST SURGICAL HISTORY:  Negative.   REVIEW OF SYMPTOMS:  Negative.   FAMILY HISTORY:  Negative for breast, colon, ovarian, or uterine carcinoma.   PHYSICAL EXAMINATION:  GENERAL:  Pleasant female who appears slightly distressed at 283 pounds.  VITAL SIGNS:  Blood pressure 150/108, respirations 14 and regular, height 5  feet 11 inches.  HEENT:  Grossly normal without evidence of petechiae or AV nicking in the  fundus.  BREASTS:  Without masses, discharge, thickening, or tenderness.  CHEST:  Clear to auscultation and percussion.  HEART:  No murmurs, gallops, and rubs, regular rate and rhythm.  Extremities, lymphatic, neurological, and musculoskeletal exams within  normal limits.  ABDOMEN:  33 cm size abdomen and palpates  normal.  PELVIC:  Cervix is long, closed, and thick, uninducible with a Bishop score  of 0.  External genitalia, vulva, and vagina normal.  RECTAL:  Exam deferred.   IMPRESSION:  The patient is a [redacted] week gestation primipara with evidence of  severe pre-eclampsia by virtue of a worsening and persistent global  headache, right upper quadrant epigastric pain, and blood pressure 150/108  to 110.  These have been unremitting symptoms in so far as the headache as  well as the right upper quadrant and epigastric pain.  Although her  laboratory work is within normal limits, the patient is classified as severe  pre-eclampsia.   The patient was also evaluated by Dr. Nanetta Batty in the first trimester  because of heart palpitations.  His impression at the time was chronotropic  incompetence with resting tachycardia.  A 2D echocardiogram was normal and  thyroid stimulating hormone value was also normal.  At this time, the  patient has not been symptomatic regarding palpitations and beta blockade  was withheld.   NICU consultation was obtained prior to performing the cesarean section  based on the seriousness of her condition and also her noninducible cervix.  A full explanation to the patient about the risks and benefits associated  with severe pre-eclampsia including the management of the fetus was  discussed in detail and all questions answered to the satisfaction of the  patient and family.  She is, therefore, scheduled for a primary low  transverse cesarean section.      SHB/MEDQ  D:  01/30/2005  T:  01/30/2005  Job:  [id number]

## 2011-03-06 NOTE — H&P (Signed)
NAMEJESSICAANN, [name]               ACCOUNT NO.:  [id number]   MEDICAL RECORD NO.:  [id number]          PATIENT TYPE:  OBV   LOCATION:  9170                          [facility]   PHYSICIAN:  Ginger Carne, MD  DATE OF BIRTH:  1981/03/04   DATE OF ADMISSION:  01/29/2005  DATE OF DISCHARGE:                                HISTORY & PHYSICAL   CHIEF COMPLAINT:  Mild preeclampsia with headache.   DETAILS OF PRESENT ILLNESS:  This patient is a 30 year old gravida 1, para 41  Caucasian female.  EDC is Mar 18, 2005, 33 weeks' gestation admitted because  of persistent blood pressure readings in the range of 138/108 to 150/100  since  January 27, 2005.  The patient was seen on April 11 in the office  because she was not feeling well.  At that time her blood pressure was  138/108 with a +1 proteinuria.  PIH labs revealed no evidence suggestive of  HELLP syndrome and all values were normal.  She was advised to rest and  return on April 12.  At this time her blood pressure was 140/100 and then  today was twice recorded at 150/100 with large cuff.  She was spilling +1  proteinuria.  She was complaining of a global headache today but denies  epigastric right upper quadrant pain and blurry vision.  The patient has no  known risk factors other than being a prima para for preeclampsia or  hypertension.   The patient had an ultrasound initially on her first visit due to an unknown  last menstrual period at 6 weeks which gave her a due date of Mar 18, 2005.  A follow-up ultrasound at 20-5/7 at [facility] confirms  that date within 1 week at Mar 15, 2005.  A scan obtained on January 29, 2005  to evaluate growth in the face of pre-eclampsia revealed an estimated fetal  weight of between 2200 and 2500 g which is in the 80th to 90th percentile.  There was adequate fluid.  Fetus was in the vertex presentation and all  parameters including fluid measurements were within normal limits.   AFI was  15.1 cm.  The patient prenatal care has been complicated by gestational  diabetes which has been managed by diet alone.   PAST MEDICAL HISTORY:  None.   CURRENT MEDICATIONS:  Prenatal vitamins.   ALLERGIES:  PHENERGAN AND IVP DYE.   SOCIAL HISTORY:  Positive for smoking less than 1/4 pack of cigarettes daily  but denies illicit drug abuse or alcohol abuse.   SURGICAL HISTORY:  Negative.   REVIEW OF SYSTEMS:  Negative.   FAMILY HISTORY:  Negative for breast, colon, ovarian, or uterine carcinoma.   PHYSICAL EXAMINATION:  VITAL SIGNS: The patient is a 283 pounds, blood  pressure 150/100,  respirations are 14 and regular, height is 5 feet, 11  inches.  HEENT: Grossly normal, no evidence of petechiae within the fundus or AV  narrowing.  BREAST EXAM: Without masses, discharged, thickenings, or tenderness.  CHEST: Clear to percussion and auscultation.  CARDIOVASCULAR: Without murmurs or enlargements.  Regular rate and rhythm.  EXTREMITIES, LYMPHATICS, SKIN, NEUROLOGICAL, MUSCULOSKELETAL SYSTEMS: Within  normal limits.  ABDOMEN: A pregnancy at 32 cm measurement and palpates normally.  PELVIC: The patient's cervix is long, closed, and thick without evidence of  discharge.  External genitalia, vulva, and vagina are otherwise normal.  RECTAL:  Deferred.   IMPRESSION:  A 33-week gestation prima para with evidence for elevated blood  pressure of 150/100, +1 proteinuria persistently and normal laboratory  values as of January 27, 2005.  The patient was admitted for observation.  Pregnancy induced hypertension labs were obtained including careful  monitoring of blood pressure and fetal activity as well as fetal monitoring  as well.  The patient was advised to alert the nursing staff if she develops  any worsening headache, blurry vision, right upper quadrant, or epigastric  pain as well.  It should be added that during the first trimester of her  pregnancy, she had complained of  heart palpitations for which was evaluated  by Dr. Nanetta Batty of [facility].  His  impression at the time was that of chronotropic incompetence with resting  tachycardia.  A 2-D echocardiogram was normal and a thyroid stimulating  hormone value was also normal.  At this time, the patient has not been  symptomatic throughout her course of pregnancy and beta blockade was not  necessary to start.      SHB/MEDQ  D:  01/29/2005  T:  01/29/2005  Job:  [id number]

## 2011-03-06 NOTE — Op Note (Signed)
[name], [name]               ACCOUNT NO.:  [id number]   MEDICAL RECORD NO.:  [id number]          PATIENT TYPE:  INP   LOCATION:  9169                          [facility]   PHYSICIAN:  Ginger Carne, MD  DATE OF BIRTH:  1981/03/12   DATE OF PROCEDURE:  01/30/2005  DATE OF DISCHARGE:                                 OPERATIVE REPORT   PREOPERATIVE DIAGNOSIS:  Preterm severe pre-eclampsia with noninducible  cervix.   POSTOPERATIVE DIAGNOSIS:  Preterm severe pre-eclampsia with noninducible  cervix, preterm viable delivery of a female infant.   PROCEDURE:  Primary low transverse cesarean section.   SURGEON:  Ginger Carne, MD   ASSISTANT:  None.   COMPLICATIONS:  None.   ESTIMATED BLOOD LOSS:  750 mL.   SPECIMENS:  Cord blood.   ANESTHESIA:  Epidural.   OPERATIVE FINDINGS:  Preterm female delivered in the vertex presentation, no  gross abnormalities.  The baby cried spontaneously at delivery.  Bulb  suctioned and handed to the NICU staff.  Placenta demonstrated three  vessels, complete, with central insertion of the cord.  The fluid was clear.  The uterus, tubes, and ovaries showed normal changes of pregnancy.   OPERATIVE PROCEDURE:  The patient was prepped and draped in the usual  fashion and placed in the left lateral supine position.  Betadine solution  was used for antiseptic and the patient was catheterized prior to the  procedure.  After adequate epidural analgesia, a Pfannenstiel incision was  made and the abdomen opened.  Following this, the lower uterine segment was  incised transversely after developing the bladder flap.  The placenta was  removed manually, the uterus inspected, baby delivered, cord clamped and  handed to the pediatric staff after bulb suctioning. Cord blood was sent to  pathology.  The uterus was inspected, closure of the uterine musculature in  one layer with 0 Vicryl running interlocking suture, bleeding points  hemostatically  checked and blood clots removed from the abdomen.  Closure of  the parietal peritoneum with 2-0 Vicryl running suture, 0 Vicryl running for  the fascia, 3-0  Vicryl for the subcutaneous closure, and 4-0 Prolene for a subcuticular  closure.  The instrument and sponge counts were correct.  The patient  tolerated the procedure well and returned to the post anesthesia care unit  in excellent condition.  The fundus was firm with minimal bleeding at the  end of the procedure.      SHB/MEDQ  D:  01/30/2005  T:  01/30/2005  Job:  [id number]

## 2011-03-06 NOTE — Discharge Summary (Signed)
NAMECHARMIKA, [name]               ACCOUNT NO.:  [id number]   MEDICAL RECORD NO.:  [id number]          PATIENT TYPE:  INP   LOCATION:  9106                          [facility]   PHYSICIAN:  Ginger Carne, MD  DATE OF BIRTH:  05/16/1981   DATE OF ADMISSION:  01/30/2005  DATE OF DISCHARGE:                                 DISCHARGE SUMMARY   REASON FOR HOSPITALIZATION:  Severe preeclampsia with headache and  epigastric/right upper quadrant pain [redacted] weeks gestation.   IN HOSPITAL PROCEDURES:  Primary low transverse cesarean section.   FINAL DIAGNOSIS:  Severe preeclampsia at [redacted] weeks gestation.   HOSPITAL COURSE:  This patient is a 30 year old gravida 1, para 98 Caucasian  female, [facility] Mar 18, 2005, 33 and 2/[redacted] weeks gestation admitted on the  April14,2006 because of a worsening two day global headache, right upper  quadrant pain and epigastric pain. On evaluation, the patient's blood  pressure was between 140 and 180 systolic and 90-115 diastolic with +1  proteinuria. The patient was uninducible and was scheduled for a primary low  transverse cesarean section on January 30, 2005.   A preterm female weighing 5 pounds 1 ounce with Apgar's of 7 and 9 was  delivered.  Postoperative course was uneventful. She was afebrile. Her blood  pressure returned to the normal range and her symptoms of headache,  epigastric pain, and  right upper quadrant pain abated. Her incision was dry  and no evidence for calf tenderness. Lungs were clear. Cardiac status  normal.   The patient was given routine discharge instructions including contacting  the office for temperature elevation above 100.4 degrees Fahrenheit,  difficulties with voiding, constipation, increasing vaginal bleeding,  drainage from incision site and/or increased redness. A long discussion with  the patient regarding concerns about postpartum depression was also  performed.   The patient will return in four days to have her  subcuticular suture  removed.  Prescriptions include Klonopin 1 mg up to three times a day as  needed, Lexapro 20 mg daily. Citrucel 1/2 to 1 daily as needed, Percocet  5/325 1-2 every 4-6 hours as well as Augmentin 500 mg twice a day for five  days Pyridium 200 mg up to three times a day as needed for urethritis  secondary to Foley catheter placement. The patient's postoperative  hemoglobin was 10.1, all PIH labs returned as normal postoperatively and no  evidence for HELLP syndrome noted. Baby to remain in NICU.      SHB/MEDQ  D:  02/02/2005  T:  02/02/2005  Job:  [id number]

## 2011-03-06 NOTE — H&P (Signed)
NAMEELLEANNA, [name]               ACCOUNT NO.:  [id number]   MEDICAL RECORD NO.:  [id number]          PATIENT TYPE:  INP   LOCATION:  9307                          FACILITY:  [facility]   PHYSICIAN:  Ginger Carne, MD  DATE OF BIRTH:  03/10/1981   DATE OF ADMISSION:  08/01/2004  DATE OF DISCHARGE:                                HISTORY & PHYSICAL   ADMITTING DIAGNOSIS:  Bilateral lower back pain, 7+ weeks' pregnant.   HISTORY OF PRESENT ILLNESS:  This is a 30 year old Caucasian female admitted  in the late evening of August 01, 2004 because of worsening bilateral lower  pelvic pain.  The patient was seen earlier in the office on the same day  with similar complaints.  Pelvic examination revealed minimal tenderness,  however, because of the patient's body habitus and elevated BMI, it was  difficult to appreciate all masses.  She had ++ CVA tenderness on either  side in the office without upper shoulder pain.  The urinalysis in the  office demonstrated no abnormalities, but the presumptive diagnosis of  possible pyelonephritis was entertained and she was placed on Augmentin 500  mg twice a day.   The patient called the exchange at about 10:00 in the evening, complaining  of nausea, vomiting and worsening bilateral lower back pain.  She denies  changes in bowel habits, diarrhea or loose stools.  She has no burning with  urination.  There is a question of possibility of kidney stones present in  the past, but this has never been officially diagnosed.  The patient denies  right upper quadrant pain, right shoulder pain or history of ulcers or  gastritis.   OBSTETRICAL/GYNECOLOGICAL HISTORY:  The patient's [facility] is Mar 18, 2004, she  is 7+ weeks' pregnant, gravida 1, para 0.   ALLERGIES:  PHENERGAN and IVP DYE.   CURRENT MEDICATIONS:  Prenatal vitamins.   REVIEW OF SYSTEMS:  Negative.   SOCIAL HISTORY:  Social history is negative for smoking, illicit drug abuse  or alcohol.   SURGICAL HISTORY:  Negative.   PHYSICAL EXAMINATION:  GENERAL:  Physical examination reveals a pleasant  female in no acute distress.  VITAL SIGNS:  Her admitting blood pressure was 121/68, temperature 98.7.  HEENT:  Grossly normal.  CHEST:  Clear to percussion and auscultation.  CARDIOVASCULAR:  Without murmurs or enlargements.  Regular rate and rhythm.  BREASTS:  Breasts without masses, discharge, thickenings or tenderness.  EXTREMITIES:  Negative.  LYMPHATICS:  Negative.  SKIN:  Negative.  NEUROLOGICAL:  Negative.  MUSCULOSKELETAL:  Negative.  ABDOMEN:  Abdomen is soft without gross hepatosplenomegaly or rebound.  There is ++ CVA tenderness on the right and left sides with tenderness along  the lumbosacral region.  PELVIC:  External genitalia, vulva and vagina normal.  Cervix smooth without  erosions or lesions.  Normal Pap smear in the office.  Uterus and ovaries  are not tender, but difficult to appreciate because of patient's elevated  body habitus.  RECTAL:  Hemoccult-negative without masses.   IMPRESSION:  Seven-plus-weeks' gestation with bilateral lower back pain.   PLAN:  The  patient was admitted for analgesic control (pain scale 9/10 per  patient) and for a complete workup for the etiology of said discomfort.  A  urinalysis has been ordered including a culture and sensitivity.  The  patient was placed empirically on Unasyn 3 g IV every 6 hours, although this  will be discontinued if there is no evidence for culture demonstrating any  bacteria.  The patient is scheduled in the morning for a pelvic ultrasound  and kidney scan to rule out a mass in the pelvis, possibility of kidney  stones or any retroperitoneal process.  The patient will be placed on  appropriate analgesia and CBC will also be ordered.  The patient denies  trauma or any activity that would provoke or precipitate her discomfort and  therefore at this time, there are no orthopedic or musculoskeletal  concerns  that would result in further tests to evaluate her vertebrae.     Stev   SHB/MEDQ  D:  08/02/2004  T:  08/02/2004  Job:  [id number]

## 2011-07-02 ENCOUNTER — Telehealth: Payer: Self-pay | Admitting: *Deleted

## 2011-07-02 DIAGNOSIS — B373 Candidiasis of vulva and vagina: Secondary | ICD-10-CM

## 2011-07-02 MED ORDER — FLUCONAZOLE 150 MG PO TABS: ORAL_TABLET | ORAL | Status: DC

## 2011-07-02 NOTE — Telephone Encounter (Signed)
Patient complains of yeast infection.  She has had a few over the summer probably due to increased amount of time at the pool and in wet bathing suit.  Request rx for Diflucan. Rx called into pharmacy.

## 2011-07-13 LAB — CBC
HCT: 33.3 — ABNORMAL LOW
HCT: 38
Hemoglobin: 11.7 — ABNORMAL LOW
Hemoglobin: 13
MCHC: 34.2
MCHC: 35.3
MCV: 85.3
MCV: 85.4
Platelets: 173
Platelets: 232
RBC: 3.9
RBC: 4.44
RDW: 13.5
RDW: 14.2
WBC: 10.8 — ABNORMAL HIGH
WBC: 7.3

## 2011-07-13 LAB — URINE CULTURE
Colony Count: 50000
Special Requests: NEGATIVE

## 2011-07-13 LAB — DIFFERENTIAL
Basophils Absolute: 0
Basophils Relative: 0
Eosinophils Absolute: 0
Eosinophils Relative: 0
Lymphocytes Relative: 15
Lymphs Abs: 1.6
Monocytes Absolute: 0.4
Monocytes Relative: 4
Neutro Abs: 8.7 — ABNORMAL HIGH
Neutrophils Relative %: 81 — ABNORMAL HIGH

## 2011-07-13 LAB — COMPREHENSIVE METABOLIC PANEL
ALT: 20
ALT: 25
AST: 18
AST: 20
Albumin: 2.9 — ABNORMAL LOW
Albumin: 3.5
Alkaline Phosphatase: 34 — ABNORMAL LOW
Alkaline Phosphatase: 41
BUN: 10
BUN: 8
CO2: 26
CO2: 28
Calcium: 8.2 — ABNORMAL LOW
Calcium: 9.3
Chloride: 102
Chloride: 104
Creatinine, Ser: 0.93
Creatinine, Ser: 1.19
GFR calc Af Amer: >60
GFR calc Af Amer: >60
GFR calc non Af Amer: 54 — ABNORMAL LOW
GFR calc non Af Amer: >60
Glucose, Bld: 135 — ABNORMAL HIGH
Glucose, Bld: 95
Potassium: 3.4 — ABNORMAL LOW
Potassium: 3.7
Sodium: 134 — ABNORMAL LOW
Sodium: 137
Total Bilirubin: 0.4
Total Bilirubin: 0.8
Total Protein: 5.3 — ABNORMAL LOW
Total Protein: 6.3

## 2011-07-13 LAB — POCT PREGNANCY, URINE
Operator id: 27769
Preg Test, Ur: POSITIVE

## 2011-07-13 LAB — URINALYSIS, ROUTINE W REFLEX MICROSCOPIC
Bilirubin Urine: NEGATIVE
Glucose, UA: NEGATIVE
Hgb urine dipstick: NEGATIVE
Ketones, ur: NEGATIVE
Nitrite: NEGATIVE
Protein, ur: NEGATIVE
Specific Gravity, Urine: 1.02
Urobilinogen, UA: 0.2
pH: 6.5

## 2011-07-13 LAB — HCG, QUANTITATIVE, PREGNANCY: hCG, Beta Chain, Quant, S: 13354 — ABNORMAL HIGH

## 2011-07-15 ENCOUNTER — Telehealth: Payer: Self-pay | Admitting: *Deleted

## 2011-07-15 DIAGNOSIS — B373 Candidiasis of vulva and vagina: Secondary | ICD-10-CM

## 2011-07-15 MED ORDER — FLUCONAZOLE 150 MG PO TABS: ORAL_TABLET | ORAL | Status: DC

## 2011-07-15 NOTE — Telephone Encounter (Signed)
Pharmacy does not have refill and patient would like to take at least one more tablet to be sure the infection is completely gone.  She is feeling better.

## 2011-07-30 LAB — POCT PREGNANCY, URINE
Operator id: 151321
Operator id: 272551
Preg Test, Ur: NEGATIVE
Preg Test, Ur: NEGATIVE

## 2011-07-30 LAB — URINALYSIS, ROUTINE W REFLEX MICROSCOPIC
Bilirubin Urine: NEGATIVE
Bilirubin Urine: NEGATIVE
Glucose, UA: NEGATIVE
Glucose, UA: NEGATIVE
Ketones, ur: NEGATIVE
Ketones, ur: NEGATIVE
Leukocytes, UA: NEGATIVE
Nitrite: NEGATIVE
Nitrite: NEGATIVE
Protein, ur: NEGATIVE
Protein, ur: NEGATIVE
Specific Gravity, Urine: 1.027
Specific Gravity, Urine: 1.029
Urobilinogen, UA: 0.2
Urobilinogen, UA: 1
pH: 6
pH: 6.5

## 2011-07-30 LAB — COMPREHENSIVE METABOLIC PANEL
ALT: 30
ALT: 31
ALT: 35
AST: 23
AST: 25
AST: 26
Albumin: 3.8
Albumin: 3.8
Albumin: 3.9
Alkaline Phosphatase: 61
Alkaline Phosphatase: 65
Alkaline Phosphatase: 66
BUN: 12
BUN: 6
BUN: 8
CO2: 24
CO2: 28
CO2: 31
Calcium: 9.1
Calcium: 9.2
Calcium: 9.3
Chloride: 100
Chloride: 105
Chloride: 105
Creatinine, Ser: 0.77
Creatinine, Ser: 0.82
Creatinine, Ser: 0.88
GFR calc Af Amer: >60
GFR calc Af Amer: >60
GFR calc Af Amer: >60
GFR calc non Af Amer: >60
GFR calc non Af Amer: >60
GFR calc non Af Amer: >60
Glucose, Bld: 110 — ABNORMAL HIGH
Glucose, Bld: 111 — ABNORMAL HIGH
Glucose, Bld: 153 — ABNORMAL HIGH
Potassium: 3.6
Potassium: 4
Potassium: 4
Sodium: 138
Sodium: 138
Sodium: 140
Total Bilirubin: 0.3
Total Bilirubin: 0.4
Total Bilirubin: 0.5
Total Protein: 6.8
Total Protein: 6.9
Total Protein: 7

## 2011-07-30 LAB — DIFFERENTIAL
Basophils Absolute: 0
Basophils Absolute: 0
Basophils Absolute: 0
Basophils Relative: 0
Basophils Relative: 0
Basophils Relative: 0
Eosinophils Absolute: 0
Eosinophils Absolute: 0
Eosinophils Absolute: 0
Eosinophils Relative: 0
Eosinophils Relative: 0
Eosinophils Relative: 1
Lymphocytes Relative: 17
Lymphocytes Relative: 28
Lymphocytes Relative: 32
Lymphs Abs: 1.4
Lymphs Abs: 2.3
Lymphs Abs: 2.4
Monocytes Absolute: 0.2
Monocytes Absolute: 0.4
Monocytes Absolute: 0.5
Monocytes Relative: 2 — ABNORMAL LOW
Monocytes Relative: 5
Monocytes Relative: 7
Neutro Abs: 4.5
Neutro Abs: 5.5
Neutro Abs: 6.4
Neutrophils Relative %: 61
Neutrophils Relative %: 67
Neutrophils Relative %: 81 — ABNORMAL HIGH

## 2011-07-30 LAB — URINE MICROSCOPIC-ADD ON

## 2011-07-30 LAB — CBC
HCT: 38
HCT: 38.5
HCT: 39.5
Hemoglobin: 12.7
Hemoglobin: 12.9
Hemoglobin: 13
MCHC: 32.8
MCHC: 33.4
MCHC: 33.4
MCV: 79.5
MCV: 80.4
MCV: 80.8
Platelets: 334
Platelets: 335
Platelets: 352
RBC: 4.72
RBC: 4.85
RBC: 4.88
RDW: 14.3 — ABNORMAL HIGH
RDW: 14.5 — ABNORMAL HIGH
RDW: 14.7 — ABNORMAL HIGH
WBC: 7.5
WBC: 7.9
WBC: 8.3

## 2011-07-30 LAB — I-STAT 8, (EC8 V) (CONVERTED LAB)
Acid-Base Excess: 2
BUN: 12
Bicarbonate: 29.2 — ABNORMAL HIGH
Chloride: 101
Glucose, Bld: 110 — ABNORMAL HIGH
HCT: 41
Hemoglobin: 13.9
Operator id: 151321
Potassium: 3.6
Sodium: 137
TCO2: 31
pCO2, Ven: 57.8 — ABNORMAL HIGH
pH, Ven: 7.312 — ABNORMAL HIGH

## 2011-07-30 LAB — LIPASE, BLOOD
Lipase: 24
Lipase: 33

## 2011-07-30 LAB — POCT I-STAT CREATININE
Creatinine, Ser: 1
Operator id: 151321

## 2011-08-04 LAB — CBC
HCT: 24.1 — ABNORMAL LOW
HCT: 33.5 — ABNORMAL LOW
HCT: 34.2 — ABNORMAL LOW
Hemoglobin: 11.4 — ABNORMAL LOW
Hemoglobin: 11.7 — ABNORMAL LOW
Hemoglobin: 8.3 — ABNORMAL LOW
MCHC: 34.1
MCHC: 34.1
MCHC: 34.4
MCV: 86.9
MCV: 87.2
MCV: 87.8
Platelets: 189
Platelets: 230
Platelets: 249
RBC: 2.77 — ABNORMAL LOW
RBC: 3.85 — ABNORMAL LOW
RBC: 3.9
RDW: 12.5
RDW: 12.7
RDW: 12.7
WBC: 10.9 — ABNORMAL HIGH
WBC: 13.9 — ABNORMAL HIGH
WBC: 8.8

## 2011-08-04 LAB — COMPREHENSIVE METABOLIC PANEL
ALT: 15
AST: 22
Albumin: 2.6 — ABNORMAL LOW
Alkaline Phosphatase: 101
BUN: 7
CO2: 22
Calcium: 9
Chloride: 107
Creatinine, Ser: 0.76
GFR calc Af Amer: >60
GFR calc non Af Amer: >60
Glucose, Bld: 112 — ABNORMAL HIGH
Potassium: 3.6
Sodium: 138
Total Bilirubin: 0.4
Total Protein: 5.8 — ABNORMAL LOW

## 2011-08-04 LAB — URIC ACID: Uric Acid, Serum: 5

## 2011-08-04 LAB — CULTURE, ROUTINE-ABSCESS

## 2011-08-04 LAB — LACTATE DEHYDROGENASE: LDH: 123

## 2011-08-04 LAB — RPR: RPR Ser Ql: NONREACTIVE

## 2011-08-05 LAB — RPR: RPR Ser Ql: NONREACTIVE

## 2011-08-05 LAB — CBC
HCT: 35 — ABNORMAL LOW
Hemoglobin: 11.6 — ABNORMAL LOW
MCHC: 33.2
MCV: 87.6
Platelets: 250
RBC: 3.99
RDW: 12.4
WBC: 10.8 — ABNORMAL HIGH

## 2011-08-13 ENCOUNTER — Other Ambulatory Visit: Payer: Self-pay | Admitting: *Deleted

## 2011-08-17 MED ORDER — CITALOPRAM HYDROBROMIDE 10 MG PO TABS: 10.0000 mg | ORAL_TABLET | Freq: Every day | ORAL | Status: DC

## 2011-08-17 NOTE — Telephone Encounter (Signed)
Patient feels that she is having increased anxiety and would like to go back on the medication that she has taken in the past.  Celexa worked well for her in the past, so we will call this in for her and she will call me back with updates on how it is working for her.

## 2011-10-20 DIAGNOSIS — N200 Calculus of kidney: Secondary | ICD-10-CM

## 2011-10-20 HISTORY — DX: Calculus of kidney: N20.0

## 2011-10-28 ENCOUNTER — Telehealth: Payer: Self-pay

## 2012-03-16 ENCOUNTER — Ambulatory Visit (INDEPENDENT_AMBULATORY_CARE_PROVIDER_SITE_OTHER): Payer: BC Managed Care – PPO | Admitting: Family Medicine

## 2012-03-16 ENCOUNTER — Encounter: Payer: Self-pay | Admitting: Family Medicine

## 2012-03-16 VITALS — Ht 73.0 in | Wt 288.0 lb

## 2012-03-16 DIAGNOSIS — Z01419 Encounter for gynecological examination (general) (routine) without abnormal findings: Secondary | ICD-10-CM

## 2012-03-16 DIAGNOSIS — Z309 Encounter for contraceptive management, unspecified: Secondary | ICD-10-CM

## 2012-03-16 DIAGNOSIS — IMO0001 Reserved for inherently not codable concepts without codable children: Secondary | ICD-10-CM

## 2012-03-16 DIAGNOSIS — F40243 Fear of flying: Secondary | ICD-10-CM | POA: Insufficient documentation

## 2012-03-16 DIAGNOSIS — Z1151 Encounter for screening for human papillomavirus (HPV): Secondary | ICD-10-CM

## 2012-03-16 DIAGNOSIS — F419 Anxiety disorder, unspecified: Secondary | ICD-10-CM | POA: Insufficient documentation

## 2012-03-16 DIAGNOSIS — Z124 Encounter for screening for malignant neoplasm of cervix: Secondary | ICD-10-CM

## 2012-03-16 MED ORDER — DIAPHRAGM ARC-SPRING 75 MM VA DPRH: 1.0000 | VAGINAL_INSERT | VAGINAL | Status: DC | PRN

## 2012-03-16 NOTE — Progress Notes (Signed)
  Subjective:     [name] is a 31 y.o. female and is here for a comprehensive physical exam. The patient reports problems - anxiety and panic attacks.  Seeing PCP who has recommended seeing psych.  She interested in diaphragm.Marland Kitchen  History   Social History  . Marital Status: Married    Spouse Name: N/A    Number of Children: N/A  . Years of Education: N/A   Occupational History  . Not on file.   Social History Main Topics  . Smoking status: Never Smoker   . Smokeless tobacco: Not on file  . Alcohol Use: Yes  . Drug Use: No  . Sexually Active: Yes -- Female partner(s)    Birth Control/ Protection: Other-see comments   Other Topics Concern  . Not on file   Social History Narrative  . No narrative on file   Health Maintenance  Topic Date Due  . Pap Smear  05/16/1999  . Tetanus/tdap  05/15/2000  . Influenza Vaccine  07/19/2012    The following portions of the patient's history were reviewed and updated as appropriate: allergies, current medications, past family history, past medical history, past social history, past surgical history and problem list.  Review of Systems A comprehensive review of systems was negative.   Objective:    Ht 6\' 1"  (1.854 m)  Wt 288 lb (130.636 kg)  BMI 38.00 kg/m2  LMP 03/15/2012 General appearance: alert, cooperative and appears stated age Head: Normocephalic, without obvious abnormality, atraumatic Neck: no adenopathy, supple, symmetrical, trachea midline and thyroid not enlarged, symmetric, no tenderness/mass/nodules Back: symmetric, no curvature. ROM normal. No CVA tenderness. Lungs: clear to auscultation bilaterally Breasts: normal appearance, no masses or tenderness Heart: regular rate and rhythm, S1, S2 normal, no murmur, click, rub or gallop Abdomen: soft, non-tender; bowel sounds normal; no masses,  no organomegaly Pelvic: cervix normal in appearance, external genitalia normal, no adnexal masses or tenderness, no cervical motion  tenderness, uterus normal size, shape, and consistency, vagina normal without discharge and number 75 diaphragm fit Extremities: extremities normal, atraumatic, no cyanosis or edema Pulses: 2+ and symmetric Skin: Skin color, texture, turgor normal. No rashes or lesions Lymph nodes: Cervical, supraclavicular, and axillary nodes normal. Neurologic: Grossly normal    Assessment:    Healthy female exam.      Plan:    Pap smear today Diaphragm for contraception.  Reviewed it's use. See After Visit Summary for Counseling Recommendations

## 2012-03-16 NOTE — Patient Instructions (Signed)
Anxiety and Panic Attacks Your caregiver has informed you that you are having an anxiety or panic attack. There may be many forms of this. Most of the time these attacks come suddenly and without warning. They come at any time of day, including periods of sleep, and at any time of life. They may be strong and unexplained. Although panic attacks are very scary, they are physically harmless. Sometimes the cause of your anxiety is not known. Anxiety is a protective mechanism of the body in its fight or flight mechanism. Most of these perceived danger situations are actually nonphysical situations (such as anxiety over losing a job). CAUSES  The causes of an anxiety or panic attack are many. Panic attacks may occur in otherwise healthy people given a certain set of circumstances. There may be a genetic cause for panic attacks. Some medications may also have anxiety as a side effect. SYMPTOMS  Some of the most common feelings are:  Intense terror.   Dizziness, feeling faint.   Hot and cold flashes.   Fear of going crazy.   Feelings that nothing is real.   Sweating.   Shaking.   Chest pain or a fast heartbeat (palpitations).   Smothering, choking sensations.   Feelings of impending doom and that death is near.   Tingling of extremities, this may be from over-breathing.   Altered reality (derealization).   Being detached from yourself (depersonalization).  Several symptoms can be present to make up anxiety or panic attacks. DIAGNOSIS  The evaluation by your caregiver will depend on the type of symptoms you are experiencing. The diagnosis of anxiety or panic attack is made when no physical illness can be determined to be a cause of the symptoms. TREATMENT  Treatment to prevent anxiety and panic attacks may include:  Avoidance of circumstances that cause anxiety.   Reassurance and relaxation.   Regular exercise.   Relaxation therapies, such as yoga.   Psychotherapy with a  psychiatrist or therapist.   Avoidance of caffeine, alcohol and illegal drugs.   Prescribed medication.  SEEK IMMEDIATE MEDICAL CARE IF:   You experience panic attack symptoms that are different than your usual symptoms.   You have any worsening or concerning symptoms.  Document Released: 10/05/2005 Document Revised: 09/24/2011 Document Reviewed: 02/06/2010 [facility] Patient Information 2012 [place], [place].Preventive Care for Adults, Female A healthy lifestyle and preventive care can promote health and wellness. Preventive health guidelines for women include the following key practices.  A routine yearly physical is a good way to check with your caregiver about your health and preventive screening. It is a chance to share any concerns and updates on your health, and to receive a thorough exam.   Visit your dentist for a routine exam and preventive care every 6 months. Brush your teeth twice a day and floss once a day. Good oral hygiene prevents tooth decay and gum disease.   The frequency of eye exams is based on your age, health, family medical history, use of contact lenses, and other factors. Follow your caregiver's recommendations for frequency of eye exams.   Eat a healthy diet. Foods like vegetables, fruits, whole grains, low-fat dairy products, and lean protein foods contain the nutrients you need without too many calories. Decrease your intake of foods high in solid fats, added sugars, and salt. Eat the right amount of calories for you.Get information about a proper diet from your caregiver, if necessary.   Regular physical exercise is one of the most important things you  can do for your health. Most adults should get at least 150 minutes of moderate-intensity exercise (any activity that increases your heart rate and causes you to sweat) each week. In addition, most adults need muscle-strengthening exercises on 2 or more days a week.   Maintain a healthy weight. The body mass index  (BMI) is a screening tool to identify possible weight problems. It provides an estimate of body fat based on height and weight. Your caregiver can help determine your BMI, and can help you achieve or maintain a healthy weight.For adults 20 years and older:   A BMI below 18.5 is considered underweight.   A BMI of 18.5 to 24.9 is normal.   A BMI of 25 to 29.9 is considered overweight.   A BMI of 30 and above is considered obese.   Maintain normal blood lipids and cholesterol levels by exercising and minimizing your intake of saturated fat. Eat a balanced diet with plenty of fruit and vegetables. Blood tests for lipids and cholesterol should begin at age 86 and be repeated every 5 years. If your lipid or cholesterol levels are high, you are over 50, or you are at high risk for heart disease, you may need your cholesterol levels checked more frequently.Ongoing high lipid and cholesterol levels should be treated with medicines if diet and exercise are not effective.   If you smoke, find out from your caregiver how to quit. If you do not use tobacco, do not start.   If you are pregnant, do not drink alcohol. If you are breastfeeding, be very cautious about drinking alcohol. If you are not pregnant and choose to drink alcohol, do not exceed 1 drink per day. One drink is considered to be 12 ounces (355 mL) of beer, 5 ounces (148 mL) of wine, or 1.5 ounces (44 mL) of liquor.   Avoid use of street drugs. Do not share needles with anyone. Ask for help if you need support or instructions about stopping the use of drugs.   High blood pressure causes heart disease and increases the risk of stroke. Your blood pressure should be checked at least every 1 to 2 years. Ongoing high blood pressure should be treated with medicines if weight loss and exercise are not effective.   If you are 70 to 32 years old, ask your caregiver if you should take aspirin to prevent strokes.   Diabetes screening involves taking a  blood sample to check your fasting blood sugar level. This should be done once every 3 years, after age 58, if you are within normal weight and without risk factors for diabetes. Testing should be considered at a younger age or be carried out more frequently if you are overweight and have at least 1 risk factor for diabetes.   Breast cancer screening is essential preventive care for women. You should practice "breast self-awareness." This means understanding the normal appearance and feel of your breasts and may include breast self-examination. Any changes detected, no matter how small, should be reported to a caregiver. Women in their 44s and 30s should have a clinical breast exam (CBE) by a caregiver as part of a regular health exam every 1 to 3 years. After age 34, women should have a CBE every year. Starting at age 61, women should consider having a mammography (breast X-ray test) every year. Women who have a family history of breast cancer should talk to their caregiver about genetic screening. Women at a high risk of breast cancer should  talk to their caregivers about having magnetic resonance imaging (MRI) and a mammography every year.   The Pap test is a screening test for cervical cancer. A Pap test can show cell changes on the cervix that might become cervical cancer if left untreated. A Pap test is a procedure in which cells are obtained and examined from the lower end of the uterus (cervix).   Women should have a Pap test starting at age 55.   Between ages 15 and 42, Pap tests should be repeated every 2 years.   Beginning at age 5, you should have a Pap test every 3 years as long as the past 3 Pap tests have been normal.   Some women have medical problems that increase the chance of getting cervical cancer. Talk to your caregiver about these problems. It is especially important to talk to your caregiver if a new problem develops soon after your last Pap test. In these cases, your caregiver  may recommend more frequent screening and Pap tests.   The above recommendations are the same for women who have or have not gotten the vaccine for human papillomavirus (HPV).   If you had a hysterectomy for a problem that was not cancer or a condition that could lead to cancer, then you no longer need Pap tests. Even if you no longer need a Pap test, a regular exam is a good idea to make sure no other problems are starting.   If you are between ages 47 and 51, and you have had normal Pap tests going back 10 years, you no longer need Pap tests. Even if you no longer need a Pap test, a regular exam is a good idea to make sure no other problems are starting.   If you have had past treatment for cervical cancer or a condition that could lead to cancer, you need Pap tests and screening for cancer for at least 20 years after your treatment.   If Pap tests have been discontinued, risk factors (such as a new sexual partner) need to be reassessed to determine if screening should be resumed.   The HPV test is an additional test that may be used for cervical cancer screening. The HPV test looks for the virus that can cause the cell changes on the cervix. The cells collected during the Pap test can be tested for HPV. The HPV test could be used to screen women aged 48 years and older, and should be used in women of any age who have unclear Pap test results. After the age of 22, women should have HPV testing at the same frequency as a Pap test.   Colorectal cancer can be detected and often prevented. Most routine colorectal cancer screening begins at the age of 4 and continues through age 41. However, your caregiver may recommend screening at an earlier age if you have risk factors for colon cancer. On a yearly basis, your caregiver may provide home test kits to check for hidden blood in the stool. Use of a small camera at the end of a tube, to directly examine the colon (sigmoidoscopy or colonoscopy), can detect  the earliest forms of colorectal cancer. Talk to your caregiver about this at age 55, when routine screening begins. Direct examination of the colon should be repeated every 5 to 10 years through age 63, unless early forms of pre-cancerous polyps or small growths are found.   Hepatitis C blood testing is recommended for all people born from [birth year]  through 1965 and any individual with known risks for hepatitis C.   Practice safe sex. Use condoms and avoid high-risk sexual practices to reduce the spread of sexually transmitted infections (STIs). STIs include gonorrhea, chlamydia, syphilis, trichomonas, herpes, HPV, and human immunodeficiency virus (HIV). Herpes, HIV, and HPV are viral illnesses that have no cure. They can result in disability, cancer, and death. Sexually active women aged 77 and younger should be checked for chlamydia. Older women with new or multiple partners should also be tested for chlamydia. Testing for other STIs is recommended if you are sexually active and at increased risk.   Osteoporosis is a disease in which the bones lose minerals and strength with aging. This can result in serious bone fractures. The risk of osteoporosis can be identified using a bone density scan. Women ages 69 and over and women at risk for fractures or osteoporosis should discuss screening with their caregivers. Ask your caregiver whether you should take a calcium supplement or vitamin D to reduce the rate of osteoporosis.   Menopause can be associated with physical symptoms and risks. Hormone replacement therapy is available to decrease symptoms and risks. You should talk to your caregiver about whether hormone replacement therapy is right for you.   Use sunscreen with sun protection factor (SPF) of 30 or more. Apply sunscreen liberally and repeatedly throughout the day. You should seek shade when your shadow is shorter than you. Protect yourself by wearing long sleeves, pants, a wide-brimmed hat, and  sunglasses year round, whenever you are outdoors.   Once a month, do a whole body skin exam, using a mirror to look at the skin on your back. Notify your caregiver of new moles, moles that have irregular borders, moles that are larger than a pencil eraser, or moles that have changed in shape or color.   Stay current with required immunizations.   Influenza. You need a dose every fall (or winter). The composition of the flu vaccine changes each year, so being vaccinated once is not enough.   Pneumococcal polysaccharide. You need 1 to 2 doses if you smoke cigarettes or if you have certain chronic medical conditions. You need 1 dose at age 23 (or older) if you have never been vaccinated.   Tetanus, diphtheria, pertussis (Tdap, Td). Get 1 dose of Tdap vaccine if you are younger than age 58, are over 53 and have contact with an infant, are a Research scientist (physical sciences), are pregnant, or simply want to be protected from whooping cough. After that, you need a Td booster dose every 10 years. Consult your caregiver if you have not had at least 3 tetanus and diphtheria-containing shots sometime in your life or have a deep or dirty wound.   HPV. You need this vaccine if you are a woman age 2 or younger. The vaccine is given in 3 doses over 6 months.   Measles, mumps, rubella (MMR). You need at least 1 dose of MMR if you were born in 1957 or later. You may also need a second dose.   Meningococcal. If you are age 40 to 48 and a first-year college student living in a residence hall, or have one of several medical conditions, you need to get vaccinated against meningococcal disease. You may also need additional booster doses.   Zoster (shingles). If you are age 73 or older, you should get this vaccine.   Varicella (chickenpox). If you have never had chickenpox or you were vaccinated but received only 1 dose, talk to  your caregiver to find out if you need this vaccine.   Hepatitis A. You need this vaccine if you have a  specific risk factor for hepatitis A virus infection or you simply wish to be protected from this disease. The vaccine is usually given as 2 doses, 6 to 18 months apart.   Hepatitis B. You need this vaccine if you have a specific risk factor for hepatitis B virus infection or you simply wish to be protected from this disease. The vaccine is given in 3 doses, usually over 6 months.  Preventive Services / Frequency Ages 16 to 6  Blood pressure check.** / Every 1 to 2 years.   Lipid and cholesterol check.** / Every 5 years beginning at age 28.   Clinical breast exam.** / Every 3 years for women in their 34s and 30s.   Pap test.** / Every 2 years from ages 57 through 25. Every 3 years starting at age 74 through age 35 or 3 with a history of 3 consecutive normal Pap tests.   HPV screening.** / Every 3 years from ages 34 through ages 12 to 36 with a history of 3 consecutive normal Pap tests.   Hepatitis C blood test.** / For any individual with known risks for hepatitis C.   Skin self-exam. / Monthly.   Influenza immunization.** / Every year.   Pneumococcal polysaccharide immunization.** / 1 to 2 doses if you smoke cigarettes or if you have certain chronic medical conditions.   Tetanus, diphtheria, pertussis (Tdap, Td) immunization. / A one-time dose of Tdap vaccine. After that, you need a Td booster dose every 10 years.   HPV immunization. / 3 doses over 6 months, if you are 15 and younger.   Measles, mumps, rubella (MMR) immunization. / You need at least 1 dose of MMR if you were born in 1957 or later. You may also need a second dose.   Meningococcal immunization. / 1 dose if you are age 20 to 7 and a first-year college student living in a residence hall, or have one of several medical conditions, you need to get vaccinated against meningococcal disease. You may also need additional booster doses.   Varicella immunization.** / Consult your caregiver.   Hepatitis A immunization.** /  Consult your caregiver. 2 doses, 6 to 18 months apart.   Hepatitis B immunization.** / Consult your caregiver. 3 doses usually over 6 months.  Ages 80 to 34  Blood pressure check.** / Every 1 to 2 years.   Lipid and cholesterol check.** / Every 5 years beginning at age 58.   Clinical breast exam.** / Every year after age 94.   Mammogram.** / Every year beginning at age 69 and continuing for as long as you are in good health. Consult with your caregiver.   Pap test.** / Every 3 years starting at age 65 through age 36 or 69 with a history of 3 consecutive normal Pap tests.   HPV screening.** / Every 3 years from ages 81 through ages 69 to 65 with a history of 3 consecutive normal Pap tests.   Fecal occult blood test (FOBT) of stool. / Every year beginning at age 60 and continuing until age 79. You may not need to do this test if you get a colonoscopy every 10 years.   Flexible sigmoidoscopy or colonoscopy.** / Every 5 years for a flexible sigmoidoscopy or every 10 years for a colonoscopy beginning at age 28 and continuing until age 27.   Hepatitis C  blood test.** / For all people born from [birth year] through 1966 and any individual with known risks for hepatitis C.   Skin self-exam. / Monthly.   Influenza immunization.** / Every year.   Pneumococcal polysaccharide immunization.** / 1 to 2 doses if you smoke cigarettes or if you have certain chronic medical conditions.   Tetanus, diphtheria, pertussis (Tdap, Td) immunization.** / A one-time dose of Tdap vaccine. After that, you need a Td booster dose every 10 years.   Measles, mumps, rubella (MMR) immunization. / You need at least 1 dose of MMR if you were born in 1957 or later. You may also need a second dose.   Varicella immunization.** / Consult your caregiver.   Meningococcal immunization.** / Consult your caregiver.   Hepatitis A immunization.** / Consult your caregiver. 2 doses, 6 to 18 months apart.   Hepatitis B immunization.**  / Consult your caregiver. 3 doses, usually over 6 months.  Ages 9 and over  Blood pressure check.** / Every 1 to 2 years.   Lipid and cholesterol check.** / Every 5 years beginning at age 66.   Clinical breast exam.** / Every year after age 64.   Mammogram.** / Every year beginning at age 59 and continuing for as long as you are in good health. Consult with your caregiver.   Pap test.** / Every 3 years starting at age 38 through age 45 or 36 with a 3 consecutive normal Pap tests. Testing can be stopped between 65 and 70 with 3 consecutive normal Pap tests and no abnormal Pap or HPV tests in the past 10 years.   HPV screening.** / Every 3 years from ages 43 through ages 10 or 57 with a history of 3 consecutive normal Pap tests. Testing can be stopped between 65 and 70 with 3 consecutive normal Pap tests and no abnormal Pap or HPV tests in the past 10 years.   Fecal occult blood test (FOBT) of stool. / Every year beginning at age 43 and continuing until age 60. You may not need to do this test if you get a colonoscopy every 10 years.   Flexible sigmoidoscopy or colonoscopy.** / Every 5 years for a flexible sigmoidoscopy or every 10 years for a colonoscopy beginning at age 39 and continuing until age 64.   Hepatitis C blood test.** / For all people born from [birth year] through 1966 and any individual with known risks for hepatitis C.   Osteoporosis screening.** / A one-time screening for women ages 32 and over and women at risk for fractures or osteoporosis.   Skin self-exam. / Monthly.   Influenza immunization.** / Every year.   Pneumococcal polysaccharide immunization.** / 1 dose at age 95 (or older) if you have never been vaccinated.   Tetanus, diphtheria, pertussis (Tdap, Td) immunization. / A one-time dose of Tdap vaccine if you are over 65 and have contact with an infant, are a Research scientist (physical sciences), or simply want to be protected from whooping cough. After that, you need a Td booster dose  every 10 years.   Varicella immunization.** / Consult your caregiver.   Meningococcal immunization.** / Consult your caregiver.   Hepatitis A immunization.** / Consult your caregiver. 2 doses, 6 to 18 months apart.   Hepatitis B immunization.** / Check with your caregiver. 3 doses, usually over 6 months.  ** Family history and personal history of risk and conditions may change your caregiver's recommendations. Document Released: 12/01/2001 Document Revised: 09/24/2011 Document Reviewed: 03/02/2011 [facility] Patient Information 2012 [place],  [employer]. 

## 2012-03-16 NOTE — Progress Notes (Signed)
Patient is here for routine exam.  She is interested in a diaphram.

## 2012-03-24 ENCOUNTER — Telehealth: Payer: Self-pay

## 2012-08-03 ENCOUNTER — Telehealth: Payer: Self-pay | Admitting: *Deleted

## 2012-08-03 DIAGNOSIS — B3731 Acute candidiasis of vulva and vagina: Secondary | ICD-10-CM

## 2012-08-03 DIAGNOSIS — B373 Candidiasis of vulva and vagina: Secondary | ICD-10-CM

## 2012-08-03 MED ORDER — FLUCONAZOLE 150 MG PO TABS: ORAL_TABLET | ORAL | Status: DC

## 2012-08-03 NOTE — Telephone Encounter (Signed)
Patient is suffering a yeast infection and would like a Diflucan called in.  OTC helped but did not completely take care of it.

## 2012-10-19 NOTE — L&D Delivery Note (Signed)
Delivery Note  After SROM, patient progressed well to full dilatation.  At 4:37 AM a viable female was delivered via Vaginal, Spontaneous Delivery (Presentation: LOA).  APGAR: 9, 9; weight .   Placenta status: Intact, Spontaneous.  Cord: 3 vessels with the following complications: None.  Cord pH: n/a After delivery, cx visually inspected and what appears to be a Nabothian's cyst is at 1 o'clock.  Advised pt to mention it at 6 week checkup  Anesthesia: Epidural  Episiotomy: None Lacerations: None Suture Repair: n/a Est. Blood Loss (mL): 350  Mom to postpartum.  Baby to nursery-stable.  Levert Feinstein 06/05/2013, 5:10 AM   I was present for delivery and agree with above CRESENZO-DISHMAN,Claira Jeter

## 2012-10-24 ENCOUNTER — Ambulatory Visit (INDEPENDENT_AMBULATORY_CARE_PROVIDER_SITE_OTHER): Payer: BC Managed Care – PPO | Admitting: *Deleted

## 2012-10-24 ENCOUNTER — Encounter: Payer: Self-pay | Admitting: *Deleted

## 2012-10-24 VITALS — BP 115/91

## 2012-10-24 DIAGNOSIS — Z3480 Encounter for supervision of other normal pregnancy, unspecified trimester: Secondary | ICD-10-CM

## 2012-10-24 MED ORDER — PRENATAL VITAMINS PLUS 27-1 MG PO TABS: 1.0000 | ORAL_TABLET | Freq: Every day | ORAL | Status: DC

## 2012-10-24 NOTE — Progress Notes (Signed)
New OB intake and blood work done.  Patient is returning next week to have her initial OB visit with Dr. Shawnie Pons.  She is doing well and is interested in doing first trimester screening when the time is appropriate.

## 2012-10-25 LAB — OBSTETRIC PANEL
Antibody Screen: NEGATIVE
Basophils Absolute: 0 10*3/uL (ref 0.0–0.1)
Basophils Relative: 0 % (ref 0–1)
Eosinophils Absolute: 0.1 10*3/uL (ref 0.0–0.7)
Eosinophils Relative: 1 % (ref 0–5)
HCT: 40.1 % (ref 36.0–46.0)
Hemoglobin: 13.5 g/dL (ref 12.0–15.0)
Hepatitis B Surface Ag: NEGATIVE
Lymphocytes Relative: 23 % (ref 12–46)
Lymphs Abs: 1.5 10*3/uL (ref 0.7–4.0)
MCH: 29.4 pg (ref 26.0–34.0)
MCHC: 33.7 g/dL (ref 30.0–36.0)
MCV: 87.4 fL (ref 78.0–100.0)
Monocytes Absolute: 0.2 10*3/uL (ref 0.1–1.0)
Monocytes Relative: 3 % (ref 3–12)
Neutro Abs: 4.8 10*3/uL (ref 1.7–7.7)
Neutrophils Relative %: 73 % (ref 43–77)
Platelets: 205 10*3/uL (ref 150–400)
RBC: 4.59 MIL/uL (ref 3.87–5.11)
RDW: 13.4 % (ref 11.5–15.5)
Rh Type: POSITIVE
Rubella: 1.19 Index — ABNORMAL HIGH (ref <0.90)
WBC: 6.6 10*3/uL (ref 4.0–10.5)

## 2012-10-25 LAB — HIV ANTIBODY (ROUTINE TESTING W REFLEX): HIV: NONREACTIVE

## 2012-11-08 ENCOUNTER — Inpatient Hospital Stay (HOSPITAL_COMMUNITY): Payer: BC Managed Care – PPO

## 2012-11-08 ENCOUNTER — Inpatient Hospital Stay (HOSPITAL_COMMUNITY)
Admission: EM | Admit: 2012-11-08 | Discharge: 2012-11-09 | DRG: 886 | Disposition: A | Discharge status: Home / Self Care | Payer: BC Managed Care – PPO | Attending: Family Medicine | Admitting: Family Medicine

## 2012-11-08 ENCOUNTER — Emergency Department (HOSPITAL_COMMUNITY): Payer: BC Managed Care – PPO

## 2012-11-08 ENCOUNTER — Encounter (HOSPITAL_COMMUNITY): Payer: Self-pay | Admitting: *Deleted

## 2012-11-08 ENCOUNTER — Encounter: Payer: BC Managed Care – PPO | Admitting: Family Medicine

## 2012-11-08 DIAGNOSIS — O99891 Other specified diseases and conditions complicating pregnancy: Secondary | ICD-10-CM | POA: Diagnosis present

## 2012-11-08 DIAGNOSIS — E669 Obesity, unspecified: Secondary | ICD-10-CM | POA: Diagnosis present

## 2012-11-08 DIAGNOSIS — F411 Generalized anxiety disorder: Secondary | ICD-10-CM | POA: Diagnosis present

## 2012-11-08 DIAGNOSIS — N200 Calculus of kidney: Secondary | ICD-10-CM | POA: Diagnosis present

## 2012-11-08 DIAGNOSIS — N1330 Unspecified hydronephrosis: Secondary | ICD-10-CM | POA: Diagnosis present

## 2012-11-08 DIAGNOSIS — Z3490 Encounter for supervision of normal pregnancy, unspecified, unspecified trimester: Secondary | ICD-10-CM

## 2012-11-08 DIAGNOSIS — F419 Anxiety disorder, unspecified: Secondary | ICD-10-CM

## 2012-11-08 DIAGNOSIS — Z331 Pregnant state, incidental: Secondary | ICD-10-CM

## 2012-11-08 DIAGNOSIS — O0990 Supervision of high risk pregnancy, unspecified, unspecified trimester: Secondary | ICD-10-CM

## 2012-11-08 DIAGNOSIS — R109 Unspecified abdominal pain: Secondary | ICD-10-CM | POA: Diagnosis present

## 2012-11-08 DIAGNOSIS — O99340 Other mental disorders complicating pregnancy, unspecified trimester: Principal | ICD-10-CM | POA: Diagnosis present

## 2012-11-08 DIAGNOSIS — E860 Dehydration: Secondary | ICD-10-CM | POA: Diagnosis present

## 2012-11-08 HISTORY — DX: Unspecified hydronephrosis: N13.30

## 2012-11-08 LAB — URINALYSIS, ROUTINE W REFLEX MICROSCOPIC
Bilirubin Urine: NEGATIVE
Glucose, UA: NEGATIVE mg/dL
Ketones, ur: 15 mg/dL — AB
Nitrite: NEGATIVE
Protein, ur: 30 mg/dL — AB
Specific Gravity, Urine: 1.026 (ref 1.005–1.030)
Urobilinogen, UA: 0.2 mg/dL (ref 0.0–1.0)
pH: 6.5 (ref 5.0–8.0)

## 2012-11-08 LAB — COMPREHENSIVE METABOLIC PANEL
ALT: 16 U/L (ref 0–35)
AST: 19 U/L (ref 0–37)
Albumin: 3.4 g/dL — ABNORMAL LOW (ref 3.5–5.2)
Alkaline Phosphatase: 43 U/L (ref 39–117)
BUN: 17 mg/dL (ref 6–23)
CO2: 24 mEq/L (ref 19–32)
Calcium: 9.5 mg/dL (ref 8.4–10.5)
Chloride: 99 mEq/L (ref 96–112)
Creatinine, Ser: 0.81 mg/dL (ref 0.50–1.10)
GFR calc Af Amer: >90 mL/min (ref >90)
GFR calc non Af Amer: >90 mL/min (ref >90)
Glucose, Bld: 140 mg/dL — ABNORMAL HIGH (ref 70–99)
Potassium: 3.5 mEq/L (ref 3.5–5.1)
Sodium: 136 mEq/L (ref 135–145)
Total Bilirubin: 0.2 mg/dL — ABNORMAL LOW (ref 0.3–1.2)
Total Protein: 6.8 g/dL (ref 6.0–8.3)

## 2012-11-08 LAB — CBC WITH DIFFERENTIAL/PLATELET
Basophils Absolute: 0 10*3/uL (ref 0.0–0.1)
Basophils Relative: 0 % (ref 0–1)
Eosinophils Absolute: 0 10*3/uL (ref 0.0–0.7)
Eosinophils Relative: 0 % (ref 0–5)
HCT: 38.7 % (ref 36.0–46.0)
Hemoglobin: 13.3 g/dL (ref 12.0–15.0)
Lymphocytes Relative: 19 % (ref 12–46)
Lymphs Abs: 1.7 10*3/uL (ref 0.7–4.0)
MCH: 30.2 pg (ref 26.0–34.0)
MCHC: 34.4 g/dL (ref 30.0–36.0)
MCV: 88 fL (ref 78.0–100.0)
Monocytes Absolute: 0.5 10*3/uL (ref 0.1–1.0)
Monocytes Relative: 6 % (ref 3–12)
Neutro Abs: 6.3 10*3/uL (ref 1.7–7.7)
Neutrophils Relative %: 75 % (ref 43–77)
Platelets: 219 10*3/uL (ref 150–400)
RBC: 4.4 MIL/uL (ref 3.87–5.11)
RDW: 12.5 % (ref 11.5–15.5)
WBC: 8.5 10*3/uL (ref 4.0–10.5)

## 2012-11-08 LAB — URINE MICROSCOPIC-ADD ON

## 2012-11-08 MED ORDER — DEXTROSE 5 % IV SOLN
1.0000 g | INTRAVENOUS | Status: DC
  Administered 2012-11-09: 1 g via INTRAVENOUS
  Filled 2012-11-08 (×2): qty 10

## 2012-11-08 MED ORDER — DOCUSATE SODIUM 100 MG PO CAPS
100.0000 mg | ORAL_CAPSULE | Freq: Two times a day (BID) | ORAL | Status: DC
  Administered 2012-11-08 – 2012-11-09 (×3): 100 mg via ORAL
  Filled 2012-11-08 (×3): qty 1

## 2012-11-08 MED ORDER — ONDANSETRON HCL 4 MG/2ML IJ SOLN
4.0000 mg | Freq: Once | INTRAMUSCULAR | Status: AC
  Administered 2012-11-08: 4 mg via INTRAVENOUS
  Filled 2012-11-08: qty 2

## 2012-11-08 MED ORDER — PHENAZOPYRIDINE HCL 100 MG PO TABS
200.0000 mg | ORAL_TABLET | Freq: Once | ORAL | Status: AC
  Administered 2012-11-08: 200 mg via ORAL
  Filled 2012-11-08: qty 2

## 2012-11-08 MED ORDER — ONDANSETRON HCL 4 MG PO TABS: 4.0000 mg | ORAL_TABLET | Freq: Four times a day (QID) | ORAL | Status: DC | PRN

## 2012-11-08 MED ORDER — HYDROMORPHONE HCL PF 1 MG/ML IJ SOLN
1.0000 mg | INTRAMUSCULAR | Status: AC
  Administered 2012-11-08: 1 mg via INTRAVENOUS
  Filled 2012-11-08: qty 1

## 2012-11-08 MED ORDER — ONDANSETRON HCL 4 MG/2ML IJ SOLN
4.0000 mg | Freq: Four times a day (QID) | INTRAMUSCULAR | Status: DC | PRN
  Administered 2012-11-08 – 2012-11-09 (×3): 4 mg via INTRAVENOUS
  Filled 2012-11-08 (×3): qty 2

## 2012-11-08 MED ORDER — DEXTROSE 5 % IV SOLN
1.0000 g | Freq: Once | INTRAVENOUS | Status: AC
  Administered 2012-11-08: 1 g via INTRAVENOUS
  Filled 2012-11-08: qty 10

## 2012-11-08 MED ORDER — OXYCODONE-ACETAMINOPHEN 5-325 MG PO TABS
1.0000 | ORAL_TABLET | Freq: Once | ORAL | Status: DC
  Filled 2012-11-08: qty 1

## 2012-11-08 MED ORDER — HYDROMORPHONE HCL PF 1 MG/ML IJ SOLN
0.5000 mg | INTRAMUSCULAR | Status: DC | PRN
  Administered 2012-11-08 – 2012-11-09 (×8): 1 mg via INTRAVENOUS
  Filled 2012-11-08 (×8): qty 1

## 2012-11-08 MED ORDER — SODIUM CHLORIDE 0.9 % IV BOLUS (SEPSIS)
1000.0000 mL | Freq: Once | INTRAVENOUS | Status: AC
  Administered 2012-11-08: 1000 mL via INTRAVENOUS

## 2012-11-08 MED ORDER — SODIUM CHLORIDE 0.9 % IV SOLN
INTRAVENOUS | Status: DC
  Administered 2012-11-08: 08:00:00 via INTRAVENOUS
  Administered 2012-11-08: 100 mL/h via INTRAVENOUS
  Administered 2012-11-09: 12:00:00 via INTRAVENOUS

## 2012-11-08 NOTE — Consult Note (Signed)
Urology Consult  Referring physician: Rai,Ripudeep  MD Reason for referral: Right flank pain with presumptive right ureteral calculus, mild hydronephrosis and pregnancy.  History of Present Illness: Ms. Lenis is 32 years of age. She is currently [redacted] weeks pregnant. She has a history of nephrolithiasis with multiple clinical stone event. She has never required any surgical intervention. Patient reports that she was assessed at [facility] urology several years ago by been unable to find those records currently. The patient developed some fairly classic and typical right-sided renal colic yesterday and was noted to have mild right-sided hydronephrosis. She was having fairly significant flank and abdominal discomfort but has had a substantial decrease in her pain this afternoon. An obvious stone has not yet passed. Ultrasound revealed a 7 mm nonobstructing stone in the right kidney. There was mild hydronephrosis but again a ureteral calculus cannot be appreciated. Serum creatinine was 0.8. Urinalysis was positive for significant microhematuria with very minimal pyuria.  Past Medical History  Diagnosis Date  . Anxiety   . Pregnancy induced hypertension     labetolol  . Kidney stones 2013    13 passed   Past Surgical History  Procedure Date  . Cesarean section 2006    Medications:  Scheduled:   . cefTRIAXone (ROCEPHIN)  IV  1 g Intravenous Q24H  . docusate sodium  100 mg Oral BID  . oxyCODONE-acetaminophen  1 tablet Oral Once    Allergies:  Allergies  Allergen Reactions  . Ivp Dye (Iodinated Diagnostic Agents) Anaphylaxis  . Phenergan Fortis (Promethazine) Itching    Itching/pins and needles for hours.    Family History  Problem Relation Age of Onset  . Schizophrenia Maternal Grandmother   . Atrial fibrillation Mother   . Depression Mother   . Anxiety disorder Mother     Social History:  reports that she has never smoked. She has never used smokeless tobacco. She reports that she  does not drink alcohol or use illicit drugs.  Positive for abdominal and flank discomfort along with nausea. She has also experienced urinary frequency urgency and bladder pressures/spasm. No fever or chills. Otherwise negative in major categories.  Physical Exam:  Vital signs in last 24 hours: Temp:  [98.1 F (36.7 C)-99.4 F (37.4 C)] 98.7 F (37.1 C) (01/21 1333) Pulse Rate:  [86-89] 87  (01/21 1333) Resp:  [18-22] 18  (01/21 1333) BP: (120-148)/(62-80) 120/62 mmHg (01/21 1333) SpO2:  [97 %-100 %] 97 % (01/21 1333)  Constitutional: Vital signs reviewed. WD WN in NAD Head: Normocephalic and atraumatic   Eyes: PERRL, No scleral icterus.  Neck: Supple No  Gross JVD Cardiovascular: RRR Pulmonary/Chest: Normal effort Abdominal: Soft.  Genitourinary: Not examined Extremities: No cyanosis or edema  Neurological: Grossly non-focal.  Skin: Warm,very dry and intact. No rash, cyanosis   Laboratory Data:  Results for orders placed during the hospital encounter of 11/08/12 (from the past 72 hour(s))  URINALYSIS, ROUTINE W REFLEX MICROSCOPIC     Status: Abnormal   Collection Time   11/08/12 12:17 AM      Component Value Range Comment   Color, Urine RED (*) YELLOW BIOCHEMICALS MAY BE AFFECTED BY COLOR   APPearance CLOUDY (*) CLEAR    Specific Gravity, Urine 1.026  1.005 - 1.030    pH 6.5  5.0 - 8.0    Glucose, UA NEGATIVE  NEGATIVE mg/dL    Hgb urine dipstick LARGE (*) NEGATIVE    Bilirubin Urine NEGATIVE  NEGATIVE    Ketones, ur 15 (*)  NEGATIVE mg/dL    Protein, ur 30 (*) NEGATIVE mg/dL    Urobilinogen, UA 0.2  0.0 - 1.0 mg/dL    Nitrite NEGATIVE  NEGATIVE    Leukocytes, UA SMALL (*) NEGATIVE   URINE MICROSCOPIC-ADD ON     Status: Abnormal   Collection Time   11/08/12 12:17 AM      Component Value Range Comment   Squamous Epithelial / LPF RARE  RARE    WBC, UA 3-6  <3 WBC/hpf    RBC / HPF TOO NUMEROUS TO COUNT  <3 RBC/hpf    Bacteria, UA FEW (*) RARE   CBC WITH DIFFERENTIAL      Status: Normal   Collection Time   11/08/12 12:44 AM      Component Value Range Comment   WBC 8.5  4.0 - 10.5 K/uL    RBC 4.40  3.87 - 5.11 MIL/uL    Hemoglobin 13.3  12.0 - 15.0 g/dL    HCT 56.2  13.0 - 86.5 %    MCV 88.0  78.0 - 100.0 fL    MCH 30.2  26.0 - 34.0 pg    MCHC 34.4  30.0 - 36.0 g/dL    RDW 78.4  69.6 - 29.5 %    Platelets 219  150 - 400 K/uL    Neutrophils Relative 75  43 - 77 %    Neutro Abs 6.3  1.7 - 7.7 K/uL    Lymphocytes Relative 19  12 - 46 %    Lymphs Abs 1.7  0.7 - 4.0 K/uL    Monocytes Relative 6  3 - 12 %    Monocytes Absolute 0.5  0.1 - 1.0 K/uL    Eosinophils Relative 0  0 - 5 %    Eosinophils Absolute 0.0  0.0 - 0.7 K/uL    Basophils Relative 0  0 - 1 %    Basophils Absolute 0.0  0.0 - 0.1 K/uL   COMPREHENSIVE METABOLIC PANEL     Status: Abnormal   Collection Time   11/08/12 12:44 AM      Component Value Range Comment   Sodium 136  135 - 145 mEq/L    Potassium 3.5  3.5 - 5.1 mEq/L    Chloride 99  96 - 112 mEq/L    CO2 24  19 - 32 mEq/L    Glucose, Bld 140 (*) 70 - 99 mg/dL    BUN 17  6 - 23 mg/dL    Creatinine, Ser 2.84  0.50 - 1.10 mg/dL    Calcium 9.5  8.4 - 13.2 mg/dL    Total Protein 6.8  6.0 - 8.3 g/dL    Albumin 3.4 (*) 3.5 - 5.2 g/dL    AST 19  0 - 37 U/L    ALT 16  0 - 35 U/L    Alkaline Phosphatase 43  39 - 117 U/L    Total Bilirubin 0.2 (*) 0.3 - 1.2 mg/dL    GFR calc non Af Amer >90  >90 mL/min    GFR calc Af Amer >90  >90 mL/min    No results found for this or any previous visit (from the past 240 hour(s)). Creatinine:  Basename 11/08/12 0044  CREATININE 0.81   Baseline Creatinine:   Impression/Assessment:  Right-sided flank and abdominal pain consistent with renal colic. Patient has mild hydronephrosis that would not be expected at this stage of pregnancy. Given these facts along with her history and microhematuria she was certainly has a  ureteral calculus.  Plan:  No indication for any acute intervention at this time.  Unfortunately its impossible to know the size of the stone at this time. I do suspect it is in the distal ureter given her irritative bladder symptoms. Hopefully she will indeed pass the stone spontaneously. If her pain control is reasonable in the morning and she has no other complicating factors she should be able to be discharged as an outpatient with followup. If her pain persists or worsens I would continue to observe her in her conservatively. Patient continues to have ongoing severe pain and she certainly will need potential intervention which in this case would be [place] managed with percutaneous nephrostomy tube placed by interventional radiology with ultrasound guidance to avoid/minimize fluoroscopy.  CC Dr. Thad Ranger  Barron Alvine S 11/08/2012, 4:40 PM

## 2012-11-08 NOTE — ED Notes (Addendum)
Pt states hx of kidney stones. Pt states passed 13 stones before. Pt right flank hurting starting today. Pt states SOB, Nausea. Pt denies problems with bowel movements, and no blood in urine. Pt [redacted] weeks pregnant, and denies vaginal bleeding.

## 2012-11-08 NOTE — H&P (Signed)
[facility] History and Physical  [name] FAO:[id number] DOB: 05/15/1982    PCP:   Clydell Hakim, MD   Chief Complaint: flank pain, nausea and vomiting for 2 days.  HPI: [name] is an 32 y.o. female  G5P4A1, currently [redacted] weeks pregnant with hx of prior kidney stone, anxiety, pregnancy induced HTN, presents to the ER with 2 days hx of flank pain, nausea, vomiting, but no dysuria, fever or chills.  She denied any vaginal discharge.  Evaluation in the ER showed [place] with mild hydronephrosis on the right side, and a nonobtructive kidney stone of 7mm on the ride kidney also.  She has a UA with RBCs and a few WBC, but it was cloudy.  She has a normal WBC and Hb of 13g/DL, with normal renal fx tests.  Hospitalist was asked to admit her for kidney stone.  Rewiew of Systems:  Constitutional: Negative for malaise, fever and chills. No significant weight loss or weight gain Eyes: Negative for eye pain, redness and discharge, diplopia, visual changes, or flashes of light. ENMT: Negative for ear pain, hoarseness, nasal congestion, sinus pressure and sore throat. No headaches; tinnitus, drooling, or problem swallowing. Cardiovascular: Negative for chest pain, palpitations, diaphoresis, dyspnea and peripheral edema. ; No orthopnea, PND Respiratory: Negative for cough, hemoptysis, wheezing and stridor. No pleuritic chestpain. Gastrointestinal: Negative for nausea, vomiting, diarrhea, constipation, abdominal pain, melena, blood in stool, hematemesis, jaundice and rectal bleeding.    Genitourinary: Negative for  incontinence Musculoskeletal: Negative for back pain and neck pain. Negative for swelling and trauma.;  Skin: . Negative for pruritus, rash, abrasions, bruising and skin lesion.; ulcerations Neuro: Negative for headache, lightheadedness and neck stiffness. Negative for weakness, altered level of consciousness , altered mental status, extremity weakness, burning feet, involuntary  movement, seizure and syncope.  Psych: negative for anxiety, depression, insomnia, tearfulness, panic attacks, hallucinations, paranoia, suicidal or homicidal ideation    Past Medical History  Diagnosis Date  . Anxiety   . Pregnancy induced hypertension     labetolol  . Kidney stones 2013    13 passed    Past Surgical History  Procedure Date  . Cesarean section 2006    Medications:  HOME MEDS: Prior to Admission medications   Not on File     Allergies:  Allergies  Allergen Reactions  . Ivp Dye (Iodinated Diagnostic Agents) Anaphylaxis  . Phenergan Fortis (Promethazine) Itching    Itching/pins and needles for hours.    Social History:   reports that she has never smoked. She has never used smokeless tobacco. She reports that she does not drink alcohol or use illicit drugs.  Family History: Family History  Problem Relation Age of Onset  . Schizophrenia Maternal Grandmother   . Atrial fibrillation Mother   . Depression Mother   . Anxiety disorder Mother      Physical Exam: Filed Vitals:   11/08/12 0006 11/08/12 0519  BP: 126/79 148/74  Pulse: 86 88  Temp: 99.4 F (37.4 C)   TempSrc: Oral   Resp: 18 22  SpO2: 99%    Blood pressure 148/74, pulse 88, temperature 99.4 F (37.4 C), temperature source Oral, resp. rate 22, last menstrual period 09/11/2012, SpO2 99.00%.  GEN:  Pleasant  patient lying in the stretcher in no acute distress; cooperative with exam. PSYCH:  alert and oriented x4; does not appear anxious or depressed; affect is appropriate. HEENT: Mucous membranes pink and anicteric; PERRLA; EOM intact; no cervical lymphadenopathy nor thyromegaly or carotid  bruit; no JVD; There were no stridor. Neck is very supple. Breasts:: Not examined CHEST WALL: No tenderness CHEST: Normal respiration, clear to auscultation bilaterally.  HEART: Regular rate and rhythm.  There are no murmur, rub, or gallops.   BACK: No kyphosis or scoliosis; no CVA  tenderness ABDOMEN: soft and non-tender; no masses, no organomegaly, normal abdominal bowel sounds; no pannus; no intertriginous candida. There is no rebound and no distention. Rectal Exam: Not done EXTREMITIES: No bone or joint deformity; age-appropriate arthropathy of the hands and knees; no edema; no ulcerations.  There is no calf tenderness. Genitalia: not examined PULSES: 2+ and symmetric SKIN: Normal hydration no rash or ulceration CNS: Cranial nerves 2-12 grossly intact no focal lateralizing neurologic deficit.  Speech is fluent; uvula elevated with phonation, facial symmetry and tongue midline. DTR are normal bilaterally, cerebella exam is intact, barbinski is negative and strengths are equaled bilaterally.  No sensory loss.   Labs on Admission:  Basic Metabolic Panel:  Lab 11/08/12 1914  NA 136  K 3.5  CL 99  CO2 24  GLUCOSE 140*  BUN 17  CREATININE 0.81  CALCIUM 9.5  MG --  PHOS --   Liver Function Tests:  Lab 11/08/12 0044  AST 19  ALT 16  ALKPHOS 43  BILITOT 0.2*  PROT 6.8  ALBUMIN 3.4*   No results found for this basename: LIPASE:5,AMYLASE:5 in the last 168 hours No results found for this basename: AMMONIA:5 in the last 168 hours CBC:  Lab 11/08/12 0044  WBC 8.5  NEUTROABS 6.3  HGB 13.3  HCT 38.7  MCV 88.0  PLT 219   Cardiac Enzymes: No results found for this basename: CKTOTAL:5,CKMB:5,CKMBINDEX:5,TROPONINI:5 in the last 168 hours  CBG: No results found for this basename: GLUCAP:5 in the last 168 hours   Radiological Exams on Admission: US Renal  11/08/2012  *RADIOLOGY REPORT*  Clinical Data:  Right flank pain.  RENAL/URINARY TRACT ULTRASOUND COMPLETE  Comparison:  CT of the abdomen and pelvis performed 10/28/2010  Findings:  Right Kidney:  The right kidney measures 13.3 cm in length.  The kidney demonstrates normal size, echogenicity and configuration. No significant cortical thinning is seen. There is suggestion of mild right-sided hydronephrosis.   A 0.7 cm stone is noted at the interpole region of the right kidney; previously noted additional stones are not well characterized on the current study.  No masses are seen.  Left Kidney:  The left kidney measures 12.3 cm in length.  The kidney demonstrates normal size, echogenicity and configuration. No significant cortical thinning is seen.  No hydronephrosis or calcification is identified.  No masses are seen.  Bladder:  The bladder is not visualized.  IMPRESSION:  1.  Mild right-sided hydronephrosis noted.  Given the patient's known right-sided renal stones and prior hydronephrosis, a distal obstructing stone cannot be excluded, since this is relatively early for hydronephrosis of pregnancy. 2.  0.7 cm nonobstructing renal stone at the interpole region of the right kidney; previously noted additional stones are not well characterized on the current study.   Original Report Authenticated By: Tonia Ghent, M.D.     Assessment/Plan Present on Admission:  . Obesity, unspecified . NEPHROLITHIASIS . ANXIETY First Trimester pregnancy  PLAN: She likely has an obtructive stone on the right side causing mild hydronephrosis (though not seen in the renal US).  She is too early in her pregnancy to have pregnancy induced hydronephrosis.  She could also has a concomitant UTI.  Her urine was culture and  she was started on IV Rocephin along with IVF and IV dilaudid.  She is stable, full code, and will be admitted to general medical floor under [facility] service.  Will screen her urine. Other plans as per orders.  Code Status:FULL CODE.   Houston Siren, MD. [facility] Pager [phone] 7pm to 7am.  11/08/2012, 5:38 AM

## 2012-11-08 NOTE — ED Notes (Signed)
Patient transported to Ultrasound 

## 2012-11-08 NOTE — ED Provider Notes (Signed)
History     CSN: [id number]  Arrival date & time 11/08/12  0001   First MD Initiated Contact with Patient 11/08/12 0123      Chief Complaint  Patient presents with  . Flank Pain    (Consider location/radiation/quality/duration/timing/severity/associated sxs/prior treatment) HPIJennifer L Brown is a 32 y.o. female G5 P4 A1 with a history of 13 prior kidney stones and is currently [redacted] weeks pregnant presents with a couple days history of worsening right-sided flank pain, 10 out of 10, sharp, radiates to the front and groin associated with nausea, dysuria and frequency. There is blood in her urine. She's had no vaginal bleeding and no rush of fluid. No fevers and no chills. No chest pain, she has had shortness of breath associated with severe pain. No vomiting or diarrhea.  Patient goes to the [facility], she has not seen [name] yet for this pregnancy.  Past Medical History  Diagnosis Date  . Anxiety   . Pregnancy induced hypertension     labetolol  . Kidney stones 2013    13 passed    Past Surgical History  Procedure Date  . Cesarean section 2006    Family History  Problem Relation Age of Onset  . Schizophrenia Maternal Grandmother   . Atrial fibrillation Mother   . Depression Mother   . Anxiety disorder Mother     History  Substance Use Topics  . Smoking status: Never Smoker   . Smokeless tobacco: Never Used  . Alcohol Use: No    OB History    Grav Para Term Preterm Abortions TAB SAB Ect Mult Living   4 3 2 1      3       Review of Systems At least 10pt or greater review of systems completed and are negative except where specified in the HPI.  Allergies  Ivp dye and Phenergan fortis  Home Medications  No current outpatient prescriptions on file.  BP 126/79  Pulse 86  Temp 99.4 F (37.4 C) (Oral)  Resp 18  SpO2 99%  LMP 09/11/2012  Physical Exam  Nursing notes reviewed.  Electronic medical record reviewed. VITAL SIGNS:   Filed Vitals:   11/08/12 0006  BP: 126/79  Pulse: 86  Temp: 99.4 F (37.4 C)  TempSrc: Oral  Resp: 18  SpO2: 99%   CONSTITUTIONAL: Awake, oriented, appears non-toxic HENT: Atraumatic, normocephalic, oral mucosa pink and moist, airway patent. Nares patent without drainage. External ears normal. EYES: Conjunctiva clear, EOMI, PERRLA NECK: Trachea midline, non-tender, supple CARDIOVASCULAR: Normal heart rate, Normal rhythm, No murmurs, rubs, gallops PULMONARY/CHEST: Clear to auscultation, no rhonchi, wheezes, or rales. Symmetrical breath sounds. Non-tender. ABDOMINAL: Non-distended, obese, soft, non-tender - no rebound or guarding.  BS normal. NEUROLOGIC: Non-focal, moving all four extremities, no gross sensory or motor deficits. EXTREMITIES: No clubbing, cyanosis, or edema SKIN: Warm, Dry, No erythema, No rash  ED Course  Procedures (including critical care time)  Labs Reviewed  COMPREHENSIVE METABOLIC PANEL - Abnormal; Notable for the following:    Glucose, Bld 140 (*)     Albumin 3.4 (*)     Total Bilirubin 0.2 (*)     All other components within normal limits  URINALYSIS, ROUTINE W REFLEX MICROSCOPIC - Abnormal; Notable for the following:    Color, Urine RED (*)  BIOCHEMICALS MAY BE AFFECTED BY COLOR   APPearance CLOUDY (*)     Hgb urine dipstick LARGE (*)     Ketones, ur 15 (*)  Protein, ur 30 (*)     Leukocytes, UA SMALL (*)     All other components within normal limits  URINE MICROSCOPIC-ADD ON - Abnormal; Notable for the following:    Bacteria, UA FEW (*)     All other components within normal limits  CBC WITH DIFFERENTIAL  URINE CULTURE   US Renal  11/08/2012  *RADIOLOGY REPORT*  Clinical Data:  Right flank pain.  RENAL/URINARY TRACT ULTRASOUND COMPLETE  Comparison:  CT of the abdomen and pelvis performed 10/28/2010  Findings:  Right Kidney:  The right kidney measures 13.3 cm in length.  The kidney demonstrates normal size, echogenicity and configuration. No significant cortical  thinning is seen. There is suggestion of mild right-sided hydronephrosis.  A 0.7 cm stone is noted at the interpole region of the right kidney; previously noted additional stones are not well characterized on the current study.  No masses are seen.  Left Kidney:  The left kidney measures 12.3 cm in length.  The kidney demonstrates normal size, echogenicity and configuration. No significant cortical thinning is seen.  No hydronephrosis or calcification is identified.  No masses are seen.  Bladder:  The bladder is not visualized.  IMPRESSION:  1.  Mild right-sided hydronephrosis noted.  Given the patient's known right-sided renal stones and prior hydronephrosis, a distal obstructing stone cannot be excluded, since this is relatively early for hydronephrosis of pregnancy. 2.  0.7 cm nonobstructing renal stone at the interpole region of the right kidney; previously noted additional stones are not well characterized on the current study.   Original Report Authenticated By: [name], M.D.      1. Nephrolithiasis   2. Pregnancy       MDM  [name] is a 32 y.o. female history of kidney stones presents with likely nephrolithiasis at 8 weeks pregnancy. Ultrasound obtained showing stones in the right kidney as well as some mild hydronephrosis on the right-distal ureteral obstruction cannot be ruled out. At this point, patient's kidney function is normal with a BUN of 17 and creatinine of 0.81. White count is 8.5, she is hemodynamically stable does not appear uroseptic at this point. Pain control with 2 mg of Dilaudid IV with Zofran for nausea.  Discussed patient's case with [name] from the [facility], patient can be admitted here. We'll admit patient to the hospital because she says that Percocet does not work for her pain and she cannot have NSAIDs due to her pregnancy. Also because no ureteral stone was seen, we'll need to monitor the patient's kidney function as well as her clinical  outcome, if she worsens, NE function worsens or if she looks septic or like she's getting infected it may be pertinent to get a CAT scan of her abdomen to look for the stones and possible urologic intervention. This point we'll treat her conservatively with fluids and pain medicine. We'll also give her one dose of Rocephin IV and culture the urine.  Discussed with Dr. Conley Rolls for admission      Jones Skene, MD 11/08/12 [id number]

## 2012-11-08 NOTE — Progress Notes (Addendum)
Patient ID: [name]  female  AVW:[id number]    DOB: 05/15/1982    DOA: 11/08/2012  PCP: Clydell Hakim, MD  Assessment/Plan: Principal Problem:  *NEPHROLITHIASIS with history of prior kidney stones, right hydronephrosis, flank pain: Not septic, urine culture pending - Continue IV fluids, pain control, urine straining, urology consult, discussed with Dr Isabel Caprice - She is currently on IV Rocephin (will continue due to pregnancy state and urine culture pending)   Active Problems:  Obesity, unspecified: Patient counseled on weight and diet control    Pregnancy: - Patient was to have her OB ultrasound today, confirmed with her OB/GYN, will order OB ultrasound for date and viability. Unable to do fetal weight per [facility] radiology.   Dehydration: cont IVF hydration   Abdominal pain with nausea and vomiting - Currently stable and pain is somewhat controlled  DVT Prophylaxis: SCD's  Code Status:  Disposition: FC  Subjective: Complaining of right-sided flank pain, no vomiting, no fevers or chills  Objective: Weight change:   Intake/Output Summary (Last 24 hours) at 11/08/12 1230 Last data filed at 11/08/12 0945  Gross per 24 hour  Intake    120 ml  Output      0 ml  Net    120 ml   Blood pressure 129/80, pulse 89, temperature 98.1 F (36.7 C), temperature source Oral, resp. rate 18, last menstrual period 09/11/2012, SpO2 100.00%.  Physical Exam: General: Alert and awake, oriented x3, not in any acute distress. HEENT: anicteric sclera, pupils reactive to light and accommodation, EOMI CVS: S1-S2 clear, no murmur rubs or gallops Chest: clear to auscultation bilaterally, no wheezing, rales or rhonchi Abdomen: soft nontender, nondistended, normal bowel sounds, no organomegaly Extremities: no cyanosis, clubbing or edema noted bilaterally Neuro: Cranial nerves II-XII intact, no focal neurological deficits  Lab Results: Basic Metabolic Panel:  Lab 11/08/12 8295  NA 136  K  3.5  CL 99  CO2 24  GLUCOSE 140*  BUN 17  CREATININE 0.81  CALCIUM 9.5  MG --  PHOS --   Liver Function Tests:  Lab 11/08/12 0044  AST 19  ALT 16  ALKPHOS 43  BILITOT 0.2*  PROT 6.8  ALBUMIN 3.4*   No results found for this basename: LIPASE:2,AMYLASE:2 in the last 168 hours No results found for this basename: AMMONIA:2 in the last 168 hours CBC:  Lab 11/08/12 0044  WBC 8.5  NEUTROABS 6.3  HGB 13.3  HCT 38.7  MCV 88.0  PLT 219    Studies/Results: US Renal  11/08/2012  *RADIOLOGY REPORT*  Clinical Data:  Right flank pain.  RENAL/URINARY TRACT ULTRASOUND COMPLETE  Comparison:  CT of the abdomen and pelvis performed 10/28/2010  Findings:  Right Kidney:  The right kidney measures 13.3 cm in length.  The kidney demonstrates normal size, echogenicity and configuration. No significant cortical thinning is seen. There is suggestion of mild right-sided hydronephrosis.  A 0.7 cm stone is noted at the interpole region of the right kidney; previously noted additional stones are not well characterized on the current study.  No masses are seen.  Left Kidney:  The left kidney measures 12.3 cm in length.  The kidney demonstrates normal size, echogenicity and configuration. No significant cortical thinning is seen.  No hydronephrosis or calcification is identified.  No masses are seen.  Bladder:  The bladder is not visualized.  IMPRESSION:  1.  Mild right-sided hydronephrosis noted.  Given the patient's known right-sided renal stones and prior hydronephrosis, a distal obstructing stone cannot be  excluded, since this is relatively early for hydronephrosis of pregnancy. 2.  0.7 cm nonobstructing renal stone at the interpole region of the right kidney; previously noted additional stones are not well characterized on the current study.   Original Report Authenticated By: Tonia Ghent, M.D.     Medications: Scheduled Meds:   . cefTRIAXone (ROCEPHIN)  IV  1 g Intravenous Q24H  . docusate sodium   100 mg Oral BID  . oxyCODONE-acetaminophen  1 tablet Oral Once   Time spent examining patient, coordinating care, consultation with urology, plan of care with patient and family: 40 mins   LOS: 0 days   [name] M.D. [facility] 11/08/2012, 12:30 PM Pager: [phone]  If 7PM-7AM, please contact night-coverage [email or web address] Password [email or web address]

## 2012-11-08 NOTE — ED Notes (Signed)
Pt reports pain has gotten worse again and also feels as though she is having bladder spasms. MD made aware. Orders to follow

## 2012-11-08 NOTE — ED Notes (Addendum)
Mother up to desk, not happy, explained: "waiting for rooms to be emptied and cleaned, multiple rooms ready presently, will not be a long wait for anybody at this time". Wait process and rationale explained. Pt remains alert, still, calm, sitting in chair.

## 2012-11-08 NOTE — ED Notes (Addendum)
Updated mother at nurse first, of wait, plan & process with rationale. "10 people waiting, 10 rooms getting ready to open up, just have to get the rooms clean". Mother not happy. Pt alert, still, calm, sitting upright in chair"

## 2012-11-08 NOTE — ED Notes (Addendum)
Pt refusing blood work at this time. Pt states she cannot urinate with this much pain. Pt refusing percocet, wants to know how long the wait is for IV medications. Pt states percocet will not help her pain. Pt refusing ODT zofran as well.

## 2012-11-08 NOTE — ED Notes (Signed)
Pt made aware to change into gown. Pt continues to refused pain medication because percocet never helped her in the past.

## 2012-11-08 NOTE — ED Notes (Signed)
Pt reports pain is worse. And wants IV medications. Pain has also moved down into urethra and reports it is becoming more difficult for her to pass urine. Pt given strainer for urine.

## 2012-11-09 ENCOUNTER — Telehealth: Payer: Self-pay | Admitting: *Deleted

## 2012-11-09 DIAGNOSIS — Z3480 Encounter for supervision of other normal pregnancy, unspecified trimester: Secondary | ICD-10-CM

## 2012-11-09 LAB — URINE CULTURE
Colony Count: NO GROWTH
Culture: NO GROWTH

## 2012-11-09 MED ORDER — PRENATAL VITAMINS PLUS 27-1 MG PO TABS: 1.0000 | ORAL_TABLET | Freq: Every day | ORAL | Status: DC

## 2012-11-09 MED ORDER — OXYCODONE-ACETAMINOPHEN 5-325 MG PO TABS: 1.0000 | ORAL_TABLET | ORAL | Status: DC | PRN

## 2012-11-09 NOTE — Progress Notes (Signed)
[facility] PROGRESS NOTE  [name] ZOX:[id number] DOB: 05/15/1982 DOA: 11/08/2012 PCP: Clydell Hakim, MD Obstetrician: Dr. Shawnie Pons  Assessment/Plan: 1. Renal colic/Presumed acute nephrolithiasis with history of prior kidney stones: Concern for obstructive stone on the right causing mild hydronephrosis, too early to be pregnancy-induced hydronephrosis. No intervention per urology. Symptoms much improved, pain nearly resolved. Urine culture negative. Plan discharge home. Discussed with Dr. Penne Lash, ok to use Percocet. 2. Right-sided hydronephrosis: Followup as an outpatient. 3. Possible UTI: Urine culture negative. Discontinue antibiotics. 4. Intrauterine pregnancy: Followup with OB as an outpatient.    Code Status: Full code  Family Communication: Discussed with husband at bedside  Disposition Plan: Home  Jackie Sacks, MD  [facility] Team 5 Pager [phone] If 7PM-7AM, please contact night-coverage at [email or web address], password [facility] 11/09/2012, 2:43 PM  LOS: 1 day   Brief narrative: 32 y.o. female G5P4A1, currently [redacted] weeks pregnant with hx of prior kidney stone, anxiety, pregnancy induced HTN, presents to the ER with 2 days hx of flank pain, nausea, vomiting, but no dysuria, fever or chills. She denied any vaginal discharge. Evaluation in the ER showed [place] with mild hydronephrosis on the right side, and a nonobtructive kidney stone of 7mm on the ride kidney also. She has a UA with RBCs and a few WBC, but it was cloudy. She has a normal WBC and Hb of 13g/DL, with normal renal fx tests. Hospitalist was asked to admit her for kidney stone.  Consultants:  Urology  Procedures:  None  Antibiotics:  Ceftriaxone 1/21 >> 1/22  HPI/Subjective: Much better. Some residual flank soreness no bladder spasms, stone may have passed.   Objective: Filed Vitals:   11/09/12 0004 11/09/12 0012 11/09/12 0618 11/09/12 1416  BP: 119/49 116/75 113/57 94/51  Pulse:   89 90    Temp:   98.7 F (37.1 C) 98.7 F (37.1 C)  TempSrc:    Oral  Resp:   18 18  SpO2:   98% 100%    Intake/Output Summary (Last 24 hours) at 11/09/12 1443 Last data filed at 11/09/12 1416  Gross per 24 hour  Intake   3257 ml  Output    600 ml  Net   2657 ml   There were no vitals filed for this visit.  Exam:  General:  Appears calm and comfortable. Moves in bed quite easily.  Cardiovascular: RRR, no m/r/g. No LE edema. Respiratory: CTA bilaterally, no w/r/r. Normal respiratory effort. Psychiatric: grossly normal mood and affect, speech fluent and appropriate  Data Reviewed: Basic Metabolic Panel:  Lab 11/08/12 8295  NA 136  K 3.5  CL 99  CO2 24  GLUCOSE 140*  BUN 17  CREATININE 0.81  CALCIUM 9.5  MG --  PHOS --   Liver Function Tests:  Lab 11/08/12 0044  AST 19  ALT 16  ALKPHOS 43  BILITOT 0.2*  PROT 6.8  ALBUMIN 3.4*   CBC:  Lab 11/08/12 0044  WBC 8.5  NEUTROABS 6.3  HGB 13.3  HCT 38.7  MCV 88.0  PLT 219   Recent Results (from the past 240 hour(s))  URINE CULTURE     Status: Normal   Collection Time   11/08/12 12:17 AM      Component Value Range Status Comment   Specimen Description URINE, CLEAN CATCH   Final    Special Requests CX ADDED AT 0053 ON [id number]   Final    Culture  Setup Time 11/08/2012 01:11   Final    Colony  Count NO GROWTH   Final    Culture NO GROWTH   Final    Report Status 11/09/2012 FINAL   Final      Studies: US Ob Comp Less 14 Wks  12-05-12  *RADIOLOGY REPORT*  Clinical Data: Early pregnancy.  Evaluate for viability and dating. Dating by LMP is 8 weeks 2 days.  OBSTETRIC <14 WK [place] AND TRANSVAGINAL OB US  Technique:  Both transabdominal and transvaginal ultrasound examinations were performed for complete evaluation of the gestation as well as the maternal uterus, adnexal regions, and pelvic cul-de-sac.  Transvaginal technique was performed to assess early pregnancy.  Comparison:  Renal ultrasound December 05, 2012  Intrauterine  gestational sac:  Visualized/normal in shape. Yolk sac: Visualized Embryo: Visualized Cardiac Activity: Visualized Heart Rate: 165 bpm  CRL: 17.2  mm  8 w  1 d             [place] EDC: 06/19/2013  Maternal uterus/adnexae: No subchorionic hemorrhage is identified.  The right ovary measures 1.3 x 0.9 x 1.5 cm and has a normal appearance.  The left ovary measures 3.3 x 2.9 x 2.8 cm and contains a 1.8 x 1.6 x 1.5 cm cyst, likely a corpus luteum cyst.  A trace amount of free pelvic fluid is visualized appear  IMPRESSION: Single living intrauterine pregnancy.  Estimated gestational age by ultrasound (8 weeks 1 day) correlates well with dating by LMP (8 weeks 2 days).  Ultrasound for fetal anatomic evaluation at 18-[redacted] weeks gestational age is recommended.   Original Report Authenticated By: Britta Mccreedy, M.D.    US Ob Transvaginal  December 05, 2012  *RADIOLOGY REPORT*  Clinical Data: Early pregnancy.  Evaluate for viability and dating. Dating by LMP is 8 weeks 2 days.  OBSTETRIC <14 WK [place] AND TRANSVAGINAL OB US  Technique:  Both transabdominal and transvaginal ultrasound examinations were performed for complete evaluation of the gestation as well as the maternal uterus, adnexal regions, and pelvic cul-de-sac.  Transvaginal technique was performed to assess early pregnancy.  Comparison:  Renal ultrasound 12/05/12  Intrauterine gestational sac:  Visualized/normal in shape. Yolk sac: Visualized Embryo: Visualized Cardiac Activity: Visualized Heart Rate: 165 bpm  CRL: 17.2  mm  8 w  1 d             [place] EDC: 06/19/2013  Maternal uterus/adnexae: No subchorionic hemorrhage is identified.  The right ovary measures 1.3 x 0.9 x 1.5 cm and has a normal appearance.  The left ovary measures 3.3 x 2.9 x 2.8 cm and contains a 1.8 x 1.6 x 1.5 cm cyst, likely a corpus luteum cyst.  A trace amount of free pelvic fluid is visualized appear  IMPRESSION: Single living intrauterine pregnancy.  Estimated gestational age by ultrasound (8 weeks 1 day)  correlates well with dating by LMP (8 weeks 2 days).  Ultrasound for fetal anatomic evaluation at 18-[redacted] weeks gestational age is recommended.   Original Report Authenticated By: Britta Mccreedy, M.D.    US Renal  Dec 05, 2012  *RADIOLOGY REPORT*  Clinical Data:  Right flank pain.  RENAL/URINARY TRACT ULTRASOUND COMPLETE  Comparison:  CT of the abdomen and pelvis performed 10/28/2010  Findings:  Right Kidney:  The right kidney measures 13.3 cm in length.  The kidney demonstrates normal size, echogenicity and configuration. No significant cortical thinning is seen. There is suggestion of mild right-sided hydronephrosis.  A 0.7 cm stone is noted at the interpole region of the right kidney; previously noted additional stones are not well characterized on  the current study.  No masses are seen.  Left Kidney:  The left kidney measures 12.3 cm in length.  The kidney demonstrates normal size, echogenicity and configuration. No significant cortical thinning is seen.  No hydronephrosis or calcification is identified.  No masses are seen.  Bladder:  The bladder is not visualized.  IMPRESSION:  1.  Mild right-sided hydronephrosis noted.  Given the patient's known right-sided renal stones and prior hydronephrosis, a distal obstructing stone cannot be excluded, since this is relatively early for hydronephrosis of pregnancy. 2.  0.7 cm nonobstructing renal stone at the interpole region of the right kidney; previously noted additional stones are not well characterized on the current study.   Original Report Authenticated By: Tonia Ghent, M.D.     Scheduled Meds:   . cefTRIAXone (ROCEPHIN)  IV  1 g Intravenous Q24H  . docusate sodium  100 mg Oral BID  . oxyCODONE-acetaminophen  1 tablet Oral Once   Continuous Infusions:   . sodium chloride 100 mL/hr at 11/09/12 1219    Principal Problem:  *NEPHROLITHIASIS Active Problems:  Obesity, unspecified  ANXIETY  Pregnancy  Hydronephrosis, right  Dehydration  Abdominal  pain     Jackie Sacks, MD  [facility] Team 5 Pager [phone] If 7PM-7AM, please contact night-coverage at [email or web address], password [facility] 11/09/2012, 2:43 PM  LOS: 1 day

## 2012-11-09 NOTE — Discharge Summary (Signed)
Physician Discharge Summary  [name] MVH:[id number] DOB: 05/15/1982 DOA: 11/08/2012  PCP: Clydell Hakim, MD Obstetrician: Dr. Shawnie Pons  Admit date: 11/08/2012 Discharge date: 11/09/2012  Recommendations for Outpatient Follow-up:  1. Resolution of mild right-sided hydronephrosis 2. Early intrauterine pregnancy   Follow-up Information    Follow up with Clydell Hakim, MD. (as needed)    Contact information:   [address], [address]   [place] [address] [phone]       Follow up with Reva Bores, MD. (As directed by her office)    Contact information:   [address] [place] [address] [phone]         Discharge Diagnoses:  1. Renal colic/Presumed acute nephrolithiasis with history of prior kidney stones 2. Right-sided hydronephrosis 3. Intrauterine pregnancy  Discharge Condition: Improved Disposition: Home  Diet recommendation:  regular  History of present illness:  32 y.o. female G5P4A1, currently [redacted] weeks pregnant with hx of prior kidney stone, anxiety, pregnancy induced HTN, presents to the ER with 2 days hx of flank pain, nausea, vomiting, but no dysuria, fever or chills. She denied any vaginal discharge. Evaluation in the ER showed [place] with mild hydronephrosis on the right side, and a nonobtructive kidney stone of 7mm on the ride kidney also. She has a UA with RBCs and a few WBC, but it was cloudy. She has a normal WBC and Hb of 13g/DL, with normal renal fx tests. Hospitalist was asked to admit her for kidney stone.  Hospital Course:  Ms. Lisle was admitted for further evaluation of suspected kidney stone. She was seen by urology and supportive care was recommended. Her constellation of signs and symptoms highly suggestive of renal colic and nephrolithiasis was presumed. At this point her symptoms have nearly resolved, she feels much better and is stable for discharge home. She should followup as an outpatient to ensure resolution of  hydronephrosis. She has already established outpatient OB followup.  1. Renal colic/Presumed acute nephrolithiasis with history of prior kidney stones: Concern for obstructive stone on the right causing mild hydronephrosis, too early to be pregnancy-induced hydronephrosis. No intervention per urology. Laboratory studies reassuring. Symptoms much improved, pain nearly resolved. Urine culture negative. Plan discharge home. Discussed with Dr. Penne Lash, ok to use Percocet.  2. Right-sided hydronephrosis: Followup as an outpatient.  3. Question of UTI: Urine culture negative. Discontinue antibiotics.  4. Intrauterine pregnancy: Followup with OB as an outpatient.   Consultants:  Urology  Procedures:  None  Antibiotics:  Ceftriaxone 1/21 >> 1/22  Discharge Instructions  Discharge Orders    Future Appointments: Provider: Department: Dept Phone: Center:   11/29/2012 1:15 PM Reva Bores, MD [facility] [phone] [email or web address]     Future Orders Please Complete By Expires   Diet general      Discharge instructions      Comments:   Drink plenty of water. Call physician or seek immediate medical attention for fever, increased pain, nausea, vomiting or worsening of condition.   Activity as tolerated - No restrictions          Medication List     As of 11/09/2012  3:17 PM    STOP taking these medications         buPROPion 150 MG 12 hr tablet   Commonly known as: WELLBUTRIN SR      diazepam 5 MG tablet   Commonly known as: VALIUM      sertraline 50 MG tablet  Commonly known as: ZOLOFT      TAKE these medications         oxyCODONE-acetaminophen 5-325 MG per tablet   Commonly known as: PERCOCET/ROXICET   Take 1 tablet by mouth every 4 (four) hours as needed for pain.      PRENATAL VITAMINS PLUS 27-1 MG Tabs   Take 1 tablet by mouth daily.        The results of significant diagnostics from this hospitalization (including imaging,  microbiology, ancillary and laboratory) are listed below for reference.    Significant Diagnostic Studies: US Ob Comp Less 14 Wks  11-24-2012  *RADIOLOGY REPORT*  Clinical Data: Early pregnancy.  Evaluate for viability and dating. Dating by LMP is 8 weeks 2 days.  OBSTETRIC <14 WK [place] AND TRANSVAGINAL OB US  Technique:  Both transabdominal and transvaginal ultrasound examinations were performed for complete evaluation of the gestation as well as the maternal uterus, adnexal regions, and pelvic cul-de-sac.  Transvaginal technique was performed to assess early pregnancy.  Comparison:  Renal ultrasound 24-Nov-2012  Intrauterine gestational sac:  Visualized/normal in shape. Yolk sac: Visualized Embryo: Visualized Cardiac Activity: Visualized Heart Rate: 165 bpm  CRL: 17.2  mm  8 w  1 d             [place] EDC: 06/19/2013  Maternal uterus/adnexae: No subchorionic hemorrhage is identified.  The right ovary measures 1.3 x 0.9 x 1.5 cm and has a normal appearance.  The left ovary measures 3.3 x 2.9 x 2.8 cm and contains a 1.8 x 1.6 x 1.5 cm cyst, likely a corpus luteum cyst.  A trace amount of free pelvic fluid is visualized appear  IMPRESSION: Single living intrauterine pregnancy.  Estimated gestational age by ultrasound (8 weeks 1 day) correlates well with dating by LMP (8 weeks 2 days).  Ultrasound for fetal anatomic evaluation at 18-[redacted] weeks gestational age is recommended.   Original Report Authenticated By: Britta Mccreedy, M.D.    US Renal  11-24-2012  *RADIOLOGY REPORT*  Clinical Data:  Right flank pain.  RENAL/URINARY TRACT ULTRASOUND COMPLETE  Comparison:  CT of the abdomen and pelvis performed 10/28/2010  Findings:  Right Kidney:  The right kidney measures 13.3 cm in length.  The kidney demonstrates normal size, echogenicity and configuration. No significant cortical thinning is seen. There is suggestion of mild right-sided hydronephrosis.  A 0.7 cm stone is noted at the interpole region of the right kidney;  previously noted additional stones are not well characterized on the current study.  No masses are seen.  Left Kidney:  The left kidney measures 12.3 cm in length.  The kidney demonstrates normal size, echogenicity and configuration. No significant cortical thinning is seen.  No hydronephrosis or calcification is identified.  No masses are seen.  Bladder:  The bladder is not visualized.  IMPRESSION:  1.  Mild right-sided hydronephrosis noted.  Given the patient's known right-sided renal stones and prior hydronephrosis, a distal obstructing stone cannot be excluded, since this is relatively early for hydronephrosis of pregnancy. 2.  0.7 cm nonobstructing renal stone at the interpole region of the right kidney; previously noted additional stones are not well characterized on the current study.   Original Report Authenticated By: Tonia Ghent, M.D.     Microbiology: Recent Results (from the past 240 hour(s))  URINE CULTURE     Status: Normal   Collection Time   2012/11/24 12:17 AM      Component Value Range Status Comment   Specimen Description  URINE, CLEAN CATCH   Final    Special Requests CX ADDED AT 0053 ON [id number]   Final    Culture  Setup Time 11/08/2012 01:11   Final    Colony Count NO GROWTH   Final    Culture NO GROWTH   Final    Report Status 11/09/2012 FINAL   Final      Labs: Basic Metabolic Panel:  Lab 11/08/12 0454  NA 136  K 3.5  CL 99  CO2 24  GLUCOSE 140*  BUN 17  CREATININE 0.81  CALCIUM 9.5  MG --  PHOS --   Liver Function Tests:  Lab 11/08/12 0044  AST 19  ALT 16  ALKPHOS 43  BILITOT 0.2*  PROT 6.8  ALBUMIN 3.4*   CBC:  Lab 11/08/12 0044  WBC 8.5  NEUTROABS 6.3  HGB 13.3  HCT 38.7  MCV 88.0  PLT 219    Principal Problem:  *NEPHROLITHIASIS Active Problems:  Obesity, unspecified  ANXIETY  Pregnancy  Hydronephrosis, right  Dehydration  Abdominal pain   Time coordinating discharge: 25 minutes  Signed:  Brendia Sacks, MD [facility] 11/09/2012, 3:17 PM

## 2012-11-09 NOTE — Progress Notes (Signed)
Discharge instructions gone over with patient. Home medications gone over. Prescription for pain given to patient. Follow up appointments to be made. Signs and symptoms of worsening conditions gone over with patient. Patient verbalized understanding of instructions.

## 2012-11-09 NOTE — Progress Notes (Signed)
UR completed 

## 2012-11-09 NOTE — Telephone Encounter (Signed)
Patient would like prenatal vitamins called into the pharmacy and would also like to know if we can give her something for her anxiety.  She had been taking valium like twice a week in addition to her wellbutrin and zoloft.  She has stopped all of her meds as soon as she found out she was pregnant.  The only thing she has taken in the past that she did not tolerate was Klonipin, during her first pregnancy.  She is currently still in the hospital as of today for kidney stones and has made an appointment for Feb 11 to come into her New OB appointment.

## 2012-11-10 NOTE — Telephone Encounter (Signed)
Patient was notified that she could use the valium sparingly as needed.

## 2012-11-10 NOTE — Telephone Encounter (Signed)
She can continue Valium prn.  After 1st trimester can resume other medications.

## 2012-11-18 ENCOUNTER — Encounter: Payer: BC Managed Care – PPO | Admitting: Obstetrics & Gynecology

## 2012-11-18 ENCOUNTER — Other Ambulatory Visit (INDEPENDENT_AMBULATORY_CARE_PROVIDER_SITE_OTHER): Payer: BC Managed Care – PPO | Admitting: *Deleted

## 2012-11-18 DIAGNOSIS — R319 Hematuria, unspecified: Secondary | ICD-10-CM

## 2012-11-18 DIAGNOSIS — Z87442 Personal history of urinary calculi: Secondary | ICD-10-CM

## 2012-11-18 DIAGNOSIS — R301 Vesical tenesmus: Secondary | ICD-10-CM

## 2012-11-18 DIAGNOSIS — R3989 Other symptoms and signs involving the genitourinary system: Secondary | ICD-10-CM

## 2012-11-18 LAB — POCT URINALYSIS DIPSTICK
Bilirubin, UA: NEGATIVE
Glucose, UA: NEGATIVE
Ketones, UA: NEGATIVE
Nitrite, UA: NEGATIVE
Protein, UA: NEGATIVE
Spec Grav, UA: >=1.03
Urobilinogen, UA: NEGATIVE
pH, UA: 6

## 2012-11-18 MED ORDER — PHENAZOPYRIDINE HCL 200 MG PO TABS: 200.0000 mg | ORAL_TABLET | Freq: Three times a day (TID) | ORAL | Status: DC | PRN

## 2012-11-18 NOTE — Progress Notes (Signed)
Patient is having increased pressure with urination, frequency , and urgency, she was recently released from the hospital for kidney stones but just wants to be sure she is not having an infection due to her symptoms.  Her urine is clear except for blood.  I will send it for culture and call in some pyridium to help calm her bladder down until we can get a definitive answer on her urine.  She does have an appointment next week with a urologist to follow up her kidney stones.  She has her New OB appointment here with Dr. Shawnie Pons on February 11.

## 2012-11-20 LAB — URINE CULTURE: Colony Count: 30000

## 2012-11-29 ENCOUNTER — Encounter: Payer: Self-pay | Admitting: Family Medicine

## 2012-11-29 ENCOUNTER — Encounter: Payer: BC Managed Care – PPO | Admitting: Family Medicine

## 2012-11-29 ENCOUNTER — Ambulatory Visit (INDEPENDENT_AMBULATORY_CARE_PROVIDER_SITE_OTHER): Payer: BC Managed Care – PPO | Admitting: Family Medicine

## 2012-11-29 VITALS — BP 110/81 | Wt 301.0 lb

## 2012-11-29 DIAGNOSIS — Z3480 Encounter for supervision of other normal pregnancy, unspecified trimester: Secondary | ICD-10-CM

## 2012-11-29 DIAGNOSIS — N200 Calculus of kidney: Secondary | ICD-10-CM

## 2012-11-29 DIAGNOSIS — O34219 Maternal care for unspecified type scar from previous cesarean delivery: Secondary | ICD-10-CM

## 2012-11-29 DIAGNOSIS — O09299 Supervision of pregnancy with other poor reproductive or obstetric history, unspecified trimester: Secondary | ICD-10-CM | POA: Insufficient documentation

## 2012-11-29 HISTORY — DX: Calculus of kidney: N20.0

## 2012-11-29 NOTE — Patient Instructions (Signed)
Pregnancy - First Trimester During sexual intercourse, millions of sperm go into the vagina. Only 1 sperm will penetrate and fertilize the female egg while it is in the Fallopian tube. One week later, the fertilized egg implants into the wall of the uterus. An embryo begins to develop into a baby. At 6 to 8 weeks, the eyes and face are formed and the heartbeat can be seen on ultrasound. At the end of 12 weeks (first trimester), all the baby's organs are formed. Now that you are pregnant, you will want to do everything you can to have a healthy baby. Two of the most important things are to get good prenatal care and follow your caregiver's instructions. Prenatal care is all the medical care you receive before the baby's birth. It is given to prevent, find, and treat problems during the pregnancy and childbirth. PRENATAL EXAMS  During prenatal visits, your weight, blood pressure and urine are checked. This is done to make sure you are healthy and progressing normally during the pregnancy.  A pregnant woman should gain 25 to 35 pounds during the pregnancy. However, if you are over weight or underweight, your caregiver will advise you regarding your weight.  Your caregiver will ask and answer questions for you.  Blood work, cervical cultures, other necessary tests and a Pap test are done during your prenatal exams. These tests are done to check on your health and the probable health of your baby. Tests are strongly recommended and done for HIV with your permission. This is the virus that causes AIDS. These tests are done because medications can be given to help prevent your baby from being born with this infection should you have been infected without knowing it. Blood work is also used to find out your blood type, previous infections and follow your blood levels (hemoglobin).  Low hemoglobin (anemia) is common during pregnancy. Iron and vitamins are given to help prevent this. Later in the pregnancy,  blood tests for diabetes will be done along with any other tests if any problems develop. You may need tests to make sure you and the baby are doing well.  You may need other tests to make sure you and the baby are doing well. CHANGES DURING THE FIRST TRIMESTER (THE FIRST 3 MONTHS OF PREGNANCY) Your body goes through many changes during pregnancy. They vary from person to person. Talk to your caregiver about changes you notice and are concerned about. Changes can include:  Your menstrual period stops.  The egg and sperm carry the genes that determine what you look like. Genes from you and your partner are forming a baby. The female genes determine whether the baby is a boy or a girl.  Your body increases in girth and you may feel bloated.  Feeling sick to your stomach (nauseous) and throwing up (vomiting). If the vomiting is uncontrollable, call your caregiver.  Your breasts will begin to enlarge and become tender.  Your nipples may stick out more and become darker.  The need to urinate more. Painful urination may mean you have a bladder infection.  Tiring easily.  Loss of appetite.  Cravings for certain kinds of food.  At first, you may gain or lose a couple of pounds.  You may have changes in your emotions from day to day (excited to be pregnant or concerned something may go wrong with the pregnancy and baby).  You may have more vivid and strange dreams. HOME CARE INSTRUCTIONS   It is very important  to avoid all smoking, alcohol and un-prescribed drugs during your pregnancy. These affect the formation and growth of the baby. Avoid chemicals while pregnant to ensure the delivery of a healthy infant.  Start your prenatal visits by the 12th week of pregnancy. They are usually scheduled monthly at first, then more often in the last 2 months before delivery. Keep your caregiver's appointments. Follow your caregiver's instructions regarding medication use, blood and lab tests, exercise,  and diet.  During pregnancy, you are providing food for you and your baby. Eat regular, well-balanced meals. Choose foods such as meat, fish, milk and other low fat dairy products, vegetables, fruits, and whole-grain breads and cereals. Your caregiver will tell you of the ideal weight gain.  You can help morning sickness by keeping soda crackers at the bedside. Eat a couple before arising in the morning. You may want to use the crackers without salt on them.  Eating 4 to 5 small meals rather than 3 large meals a day also may help the nausea and vomiting.  Drinking liquids between meals instead of during meals also seems to help nausea and vomiting.  A physical sexual relationship may be continued throughout pregnancy if there are no other problems. Problems may be early (premature) leaking of amniotic fluid from the membranes, vaginal bleeding, or belly (abdominal) pain.  Exercise regularly if there are no restrictions. Check with your caregiver or physical therapist if you are unsure of the safety of some of your exercises. Greater weight gain will occur in the last 2 trimesters of pregnancy. Exercising will help:  Control your weight.  Keep you in shape.  Prepare you for labor and delivery.  Help you lose your pregnancy weight after you deliver your baby.  Wear a good support or jogging bra for breast tenderness during pregnancy. This may help if worn during sleep too.  Ask when prenatal classes are available. Begin classes when they are offered.  Do not use hot tubs, steam rooms or saunas.  Wear your seat belt when driving. This protects you and your baby if you are in an accident.  Avoid raw meat, uncooked cheese, cat litter boxes and soil used by cats throughout the pregnancy. These carry germs that can cause birth defects in the baby.  The first trimester is a good time to visit your dentist for your dental health. Getting your teeth cleaned is OK. Use a softer toothbrush and  brush gently during pregnancy.  Ask for help if you have financial, counseling or nutritional needs during pregnancy. Your caregiver will be able to offer counseling for these needs as well as refer you for other special needs.  Do not take any medications or herbs unless told by your caregiver.  Inform your caregiver if there is any mental or physical domestic violence.  Make a list of emergency phone numbers of family, friends, hospital, and police and fire departments.  Write down your questions. Take them to your prenatal visit.  Do not douche.  Do not cross your legs.  If you have to stand for long periods of time, rotate you feet or take small steps in a circle.  You may have more vaginal secretions that may require a sanitary pad. Do not use tampons or scented sanitary pads. MEDICATIONS AND DRUG USE IN PREGNANCY  Take prenatal vitamins as directed. The vitamin should contain 1 milligram of folic acid. Keep all vitamins out of reach of children. Only a couple vitamins or tablets containing iron may be  fatal to a baby or young child when ingested.  Avoid use of all medications, including herbs, over-the-counter medications, not prescribed or suggested by your caregiver. Only take over-the-counter or prescription medicines for pain, discomfort, or fever as directed by your caregiver. Do not use aspirin, ibuprofen, or naproxen unless directed by your caregiver.  Let your caregiver also know about herbs you may be using.  Alcohol is related to a number of birth defects. This includes fetal alcohol syndrome. All alcohol, in any form, should be avoided completely. Smoking will cause low birth rate and premature babies.  Street or illegal drugs are very harmful to the baby. They are absolutely forbidden. A baby born to an addicted mother will be addicted at birth. The baby will go through the same withdrawal an adult does.  Let your caregiver know about any medications that you have to  take and for what reason you take them. MISCARRIAGE IS COMMON DURING PREGNANCY A miscarriage does not mean you did something wrong. It is not a reason to worry about getting pregnant again. Your caregiver will help you with questions you may have. If you have a miscarriage, you may need minor surgery. SEEK MEDICAL CARE IF:  You have any concerns or worries during your pregnancy. It is better to call with your questions if you feel they cannot wait, rather than worry about them. SEEK IMMEDIATE MEDICAL CARE IF:   An unexplained oral temperature above 102 F (38.9 C) develops, or as your caregiver suggests.  You have leaking of fluid from the vagina (birth canal). If leaking membranes are suspected, take your temperature and inform your caregiver of this when you call.  There is vaginal spotting or bleeding. Notify your caregiver of the amount and how many pads are used.  You develop a bad smelling vaginal discharge with a change in the color.  You continue to feel sick to your stomach (nauseated) and have no relief from remedies suggested. You vomit blood or coffee ground-like materials.  You lose more than 2 pounds of weight in 1 week.  You gain more than 2 pounds of weight in 1 week and you notice swelling of your face, hands, feet, or legs.  You gain 5 pounds or more in 1 week (even if you do not have swelling of your hands, face, legs, or feet).  You get exposed to [place] measles and have never had them.  You are exposed to fifth disease or chickenpox.  You develop belly (abdominal) pain. Round ligament discomfort is a common non-cancerous (benign) cause of abdominal pain in pregnancy. Your caregiver still must evaluate this.  You develop headache, fever, diarrhea, pain with urination, or shortness of breath.  You fall or are in a car accident or have any kind of trauma.  There is mental or physical violence in your home. Document Released: 09/29/2001 Document Revised: 12/28/2011  Document Reviewed: 04/02/2009 [facility] Patient Information 2013 [place], [place].  Breastfeeding Deciding to breastfeed is one of the [place] choices you can make for you and your baby. The information that follows gives a brief overview of the benefits of breastfeeding as well as common topics surrounding breastfeeding. BENEFITS OF BREASTFEEDING For the baby  The first milk (colostrum) helps the baby's digestive system function better.   There are antibodies in the mother's milk that help the baby fight off infections.   The baby has a lower incidence of asthma, allergies, and sudden infant death syndrome (SIDS).   The nutrients in breast milk  are better for the baby than infant formulas, and breast milk helps the baby's brain grow better.   Babies who breastfeed have less gas, colic, and constipation.  For the mother  Breastfeeding helps develop a very special bond between the mother and her baby.   Breastfeeding is convenient, always available at the correct temperature, and costs nothing.   Breastfeeding burns calories in the mother and helps her lose weight that was gained during pregnancy.   Breastfeeding makes the uterus contract back down to normal size faster and slows bleeding following delivery.   Breastfeeding mothers have a lower risk of developing breast cancer.  BREASTFEEDING FREQUENCY  A healthy, full-term baby may breastfeed as often as every hour or space his or her feedings to every 3 hours.   Watch your baby for signs of hunger. Nurse your baby if he or she shows signs of hunger. How often you nurse will vary from baby to baby.   Nurse as often as the baby requests, or when you feel the need to reduce the fullness of your breasts.   Awaken the baby if it has been 3 4 hours since the last feeding.   Frequent feeding will help the mother make more milk and will help prevent problems, such as sore nipples and engorgement of the breasts.  BABY'S  POSITION AT THE BREAST  Whether lying down or sitting, be sure that the baby's tummy is facing your tummy.   Support the breast with 4 fingers underneath the breast and the thumb above. Make sure your fingers are well away from the nipple and baby's mouth.   Stroke the baby's lips gently with your finger or nipple.   When the baby's mouth is open wide enough, place all of your nipple and as much of the areola as possible into your baby's mouth.   Pull the baby in close so the tip of the nose and the baby's cheeks touch the breast during the feeding.  FEEDINGS AND SUCTION  The length of each feeding varies from baby to baby and from feeding to feeding.   The baby must suck about 2 3 minutes for your milk to get to him or her. This is called a "let down." For this reason, allow the baby to feed on each breast as long as he or she wants. Your baby will end the feeding when he or she has received the right balance of nutrients.   To break the suction, put your finger into the corner of the baby's mouth and slide it between his or her gums before removing your breast from his or her mouth. This will help prevent sore nipples.  HOW TO TELL WHETHER YOUR BABY IS GETTING ENOUGH BREAST MILK. Wondering whether or not your baby is getting enough milk is a common concern among mothers. You can be assured that your baby is getting enough milk if:   Your baby is actively sucking and you hear swallowing.   Your baby seems relaxed and satisfied after a feeding.   Your baby nurses at least 8 12 times in a 24 hour time period. Nurse your baby until he or she unlatches or falls asleep at the first breast (at least 10 20 minutes), then offer the second side.   Your baby is wetting 5 6 disposable diapers (6 8 cloth diapers) in a 24 hour period by 78 92 days of age.   Your baby is having at least 3 4 stools every 24 hours  for the first 6 weeks. The stool should be soft and yellow.   Your baby  should gain 4 7 ounces per week after he or she is 43 days old.   Your breasts feel softer after nursing.  REDUCING BREAST ENGORGEMENT  In the first week after your baby is born, you may experience signs of breast engorgement. When breasts are engorged, they feel heavy, warm, full, and may be tender to the touch. You can reduce engorgement if you:   Nurse frequently, every 2 3 hours. Mothers who breastfeed early and often have fewer problems with engorgement.   Place light ice packs on your breasts for 10 20 minutes between feedings. This reduces swelling. Wrap the ice packs in a lightweight towel to protect your skin. Bags of frozen vegetables work well for this purpose.   Take a warm shower or apply warm, moist heat to your breast for 5 10 minutes just before each feeding. This increases circulation and helps the milk flow.   Gently massage your breast before and during the feeding. Using your finger tips, massage from the chest wall towards your nipple in a circular motion.   Make sure that the baby empties at least one breast at every feeding before switching sides.   Use a breast pump to empty the breasts if your baby is sleepy or not nursing well. You may also want to pump if you are returning to work oryou feel you are getting engorged.   Avoid bottle feeds, pacifiers, or supplemental feedings of water or juice in place of breastfeeding. Breast milk is all the food your baby needs. It is not necessary for your baby to have water or formula. In fact, to help your breasts make more milk, it is [place] not to give your baby supplemental feedings during the early weeks.   Be sure the baby is latched on and positioned properly while breastfeeding.   Wear a supportive bra, avoiding underwire styles.   Eat a balanced diet with enough fluids.   Rest often, relax, and take your prenatal vitamins to prevent fatigue, stress, and anemia.  If you follow these suggestions, your  engorgement should improve in 24 48 hours. If you are still experiencing difficulty, call your lactation consultant or caregiver.  CARING FOR YOURSELF Take care of your breasts  Bathe or shower daily.   Avoid using soap on your nipples.   Start feedings on your left breast at one feeding and on your right breast at the next feeding.   You will notice an increase in your milk supply 2 5 days after delivery. You may feel some discomfort from engorgement, which makes your breasts very firm and often tender. Engorgement "peaks" out within 24 48 hours. In the meantime, apply warm moist towels to your breasts for 5 10 minutes before feeding. Gentle massage and expression of some milk before feeding will soften your breasts, making it easier for your baby to latch on.   Wear a well-fitting nursing bra, and air dry your nipples for a 3 after each feeding.   Only use cotton bra pads.   Only use pure lanolin on your nipples after nursing. You do not need to wash it off before feeding the baby again. Another option is to express a few drops of breast milk and gently massage it into your nipples.  Take care of yourself  Eat well-balanced meals and nutritious snacks.   Drinking milk, fruit juice, and water to satisfy your thirst (  about 8 glasses a day).   Get plenty of rest.  Avoid foods that you notice affect the baby in a bad way.  SEEK MEDICAL CARE IF:   You have difficulty with breastfeeding and need help.   You have a hard, red, sore area on your breast that is accompanied by a fever.   Your baby is too sleepy to eat well or is having trouble sleeping.   Your baby is wetting less than 6 diapers a day, by 34 days of age.   Your baby's skin or white part of his or her eyes is more yellow than it was in the hospital.   You feel depressed.  Document Released: 10/05/2005 Document Revised: 04/05/2012 Document Reviewed: 01/03/2012 [facility] Patient Information 2013  [place], [place].

## 2012-11-29 NOTE — Progress Notes (Signed)
   Subjective:    [name] is a [id number] [redacted]w[redacted]d being seen today for her first obstetrical visit.  Her obstetrical history is significant for obesity and nephrolithiasis, requiring hospitalization early in pregnancy.. Patient does intend to breast feed. Pregnancy history fully reviewed.  Patient reports no complaints.  Filed Vitals:   11/29/12 1400  BP: 110/81  Weight: 301 lb (136.533 kg)    HISTORY: OB History   Grav Para Term Preterm Abortions TAB SAB Ect Mult Living   4 3 2 1      3      # Outc Date GA Lbr Len/2nd Wgt Sex Del Anes PTL Lv   1 PRE 2006    M LTCS Spinal  Yes   2 TRM 2008 [redacted]w[redacted]d   M VBAC EPI  Yes   3 TRM 2010    F VBAC   Yes   4 CUR              Past Medical History  Diagnosis Date  . Anxiety   . Pregnancy induced hypertension     labetolol  . Kidney stones 2013    13 passed  . Gestational diabetes 2008 and 2010   Past Surgical History  Procedure Laterality Date  . Cesarean section  2006  . Wisdom tooth extraction      x4   Family History  Problem Relation Age of Onset  . Schizophrenia Maternal Grandmother   . Atrial fibrillation Mother   . Depression Mother   . Anxiety disorder Mother      Exam    Uterus:     Pelvic Exam:    Perineum: Normal Perineum   Vulva: Bartholin's, Urethra, Skene's normal   Vagina:  normal mucosa   pH    Cervix: multiparous appearance, no cervical motion tenderness and no lesions   Adnexa: normal adnexa   Bony Pelvis: average  System: Breast:  normal appearance, no masses or tenderness   Skin: normal coloration and turgor, no rashes    Neurologic: oriented   Extremities: normal strength, tone, and muscle mass   HEENT sclera clear, anicteric   Mouth/Teeth mucous membranes moist, pharynx normal without lesions   Neck supple   Cardiovascular: regular rate and rhythm  : Respiratory:  appears well, vitals normal, no respiratory distress, acyanotic, normal RR, ear and throat exam is normal, neck free of mass  or lymphadenopathy, chest clear, no wheezing, crepitations, rhonchi, normal symmetric air entry   Abdomen: soft, non-tender; bowel sounds normal; no masses,  no organomegaly          Assessment:    Pregnancy: [id number] Patient Active Problem List  Diagnosis  . Obesity, unspecified  . GERD  . GALLSTONES  . Acquired Cyst of Kidney  . Anxiety  . Supervision of other normal pregnancy  . Hydronephrosis, right  . Dehydration  . Previous cesarean delivery, antepartum condition or complication  . Hx of preeclampsia, prior pregnancy, currently pregnant  . Nephrolithiasis        Plan:     Initial labs drawn. Prenatal vitamins. Problem list reviewed and updated. Genetic Screening discussed First Screen: ordered.  Ultrasound discussed; fetal survey: discussed.  Follow up in 4 weeks. Marland Kitchen     PRATT,TANYA S 11/29/2012

## 2012-11-30 ENCOUNTER — Encounter (HOSPITAL_COMMUNITY): Payer: Self-pay | Admitting: Family Medicine

## 2012-11-30 LAB — GC/CHLAMYDIA PROBE AMP, URINE
Chlamydia, Swab/Urine, PCR: NEGATIVE
GC Probe Amp, Urine: NEGATIVE

## 2012-12-05 ENCOUNTER — Telehealth: Payer: Self-pay | Admitting: *Deleted

## 2012-12-05 DIAGNOSIS — B379 Candidiasis, unspecified: Secondary | ICD-10-CM

## 2012-12-05 MED ORDER — FLUCONAZOLE 150 MG PO TABS: 150.0000 mg | ORAL_TABLET | Freq: Once | ORAL | Status: DC

## 2012-12-05 NOTE — Telephone Encounter (Signed)
Patient is having some increased discharge and itchy irritation.  She feels as if she has a yeast infection.  We will call in Diflucan for her and if her symptoms persist or change she will come in to see the physician.

## 2012-12-07 ENCOUNTER — Other Ambulatory Visit (HOSPITAL_COMMUNITY): Payer: BC Managed Care – PPO

## 2012-12-09 ENCOUNTER — Ambulatory Visit (HOSPITAL_COMMUNITY)
Admission: RE | Admit: 2012-12-09 | Discharge: 2012-12-09 | Disposition: A | Discharge status: Home / Self Care | Payer: BC Managed Care – PPO | Source: Ambulatory Visit | Attending: Family Medicine | Admitting: Family Medicine

## 2012-12-09 ENCOUNTER — Other Ambulatory Visit: Payer: Self-pay

## 2012-12-09 ENCOUNTER — Encounter (HOSPITAL_COMMUNITY): Payer: Self-pay

## 2012-12-09 VITALS — BP 137/78 | HR 82 | Wt 300.0 lb

## 2012-12-09 DIAGNOSIS — Z3689 Encounter for other specified antenatal screening: Secondary | ICD-10-CM | POA: Insufficient documentation

## 2012-12-09 DIAGNOSIS — O351XX0 Maternal care for (suspected) chromosomal abnormality in fetus, not applicable or unspecified: Secondary | ICD-10-CM | POA: Insufficient documentation

## 2012-12-09 DIAGNOSIS — O09299 Supervision of pregnancy with other poor reproductive or obstetric history, unspecified trimester: Secondary | ICD-10-CM

## 2012-12-09 DIAGNOSIS — O34219 Maternal care for unspecified type scar from previous cesarean delivery: Secondary | ICD-10-CM

## 2012-12-09 DIAGNOSIS — O3510X0 Maternal care for (suspected) chromosomal abnormality in fetus, unspecified, not applicable or unspecified: Secondary | ICD-10-CM | POA: Insufficient documentation

## 2012-12-09 DIAGNOSIS — Z3480 Encounter for supervision of other normal pregnancy, unspecified trimester: Secondary | ICD-10-CM

## 2012-12-09 NOTE — Progress Notes (Signed)
[facility] ultrasound  Indication: 32 yr old G62P2103 at [redacted]w[redacted]d for first trimester screen.  Findings: 1. Single intrauterine pregnancy. 2. Fetal crown rump length is consistent with dating. 3. Normal uterus. 4. Evaluation of fetal anatomy is limited by early gestational age. 5. Normal nuchal translucency measuring 1.50mm. 6. The nasal bone is visualized.  Recommendations: 1. First trimester screen drawn today; discussed limitations of screening tests in detecting fetal aneuploidy. 2. Recommend maternal serum AFP at 15-[redacted] weeks gestation. 3. Recommend fetal anatomic survey at 18-[redacted] weeks gestation.  Eulis Foster, MD

## 2012-12-10 ENCOUNTER — Encounter: Payer: Self-pay | Admitting: Family Medicine

## 2012-12-12 ENCOUNTER — Telehealth: Payer: Self-pay | Admitting: *Deleted

## 2012-12-12 DIAGNOSIS — B379 Candidiasis, unspecified: Secondary | ICD-10-CM

## 2012-12-12 MED ORDER — FLUCONAZOLE 150 MG PO TABS: 150.0000 mg | ORAL_TABLET | Freq: Once | ORAL | Status: DC

## 2012-12-12 NOTE — Telephone Encounter (Signed)
Patient feels like she needs one more diflucan to help get rid of her yeast infection.  I will call in one more rx and she will make appointment if she is not feeling better.

## 2012-12-28 ENCOUNTER — Ambulatory Visit (INDEPENDENT_AMBULATORY_CARE_PROVIDER_SITE_OTHER): Payer: BC Managed Care – PPO | Admitting: Family Medicine

## 2012-12-28 ENCOUNTER — Encounter: Payer: Self-pay | Admitting: Family Medicine

## 2012-12-28 ENCOUNTER — Encounter: Payer: BC Managed Care – PPO | Admitting: Family Medicine

## 2012-12-28 VITALS — BP 121/86 | Wt 296.0 lb

## 2012-12-28 DIAGNOSIS — Z3482 Encounter for supervision of other normal pregnancy, second trimester: Secondary | ICD-10-CM

## 2012-12-28 DIAGNOSIS — Z3480 Encounter for supervision of other normal pregnancy, unspecified trimester: Secondary | ICD-10-CM

## 2012-12-28 NOTE — Patient Instructions (Signed)
Pregnancy - Third Trimester The third trimester of pregnancy (the last 3 months) is a period of the most rapid growth for you and your baby. The baby approaches a length of 20 inches and a weight of 6 to 10 pounds. The baby is adding on fat and getting ready for life outside your body. While inside, babies have periods of sleeping and waking, suck their thumbs, and hiccups. You can often feel small contractions of the uterus. This is false labor. It is also called Braxton-Hicks contractions. This is like a practice for labor. The usual problems in this stage of pregnancy include more difficulty breathing, swelling of the hands and feet from water retention, and having to urinate more often because of the uterus and baby pressing on your bladder.  PRENATAL EXAMS  Blood work may continue to be done during prenatal exams. These tests are done to check on your health and the probable health of your baby. Blood work is used to follow your blood levels (hemoglobin). Anemia (low hemoglobin) is common during pregnancy. Iron and vitamins are given to help prevent this. You may also continue to be checked for diabetes. Some of the past blood tests may be done again.  The size of the uterus is measured during each visit. This makes sure your baby is growing properly according to your pregnancy dates.  Your blood pressure is checked every prenatal visit. This is to make sure you are not getting toxemia.  Your urine is checked every prenatal visit for infection, diabetes and protein.  Your weight is checked at each visit. This is done to make sure gains are happening at the suggested rate and that you and your baby are growing normally.  Sometimes, an ultrasound is performed to confirm the position and the proper growth and development of the baby. This is a test done that bounces harmless sound waves off the baby so your caregiver can more accurately determine due dates.  Discuss the type of pain medication  and anesthesia you will have during your labor and delivery.  Discuss the possibility and anesthesia if a Cesarean Section might be necessary.  Inform your caregiver if there is any mental or physical violence at home. Sometimes, a specialized non-stress test, contraction stress test and biophysical profile are done to make sure the baby is not having a problem. Checking the amniotic fluid surrounding the baby is called an amniocentesis. The amniotic fluid is removed by sticking a needle into the belly (abdomen). This is sometimes done near the end of pregnancy if an early delivery is required. In this case, it is done to help make sure the baby's lungs are mature enough for the baby to live outside of the womb. If the lungs are not mature and it is unsafe to deliver the baby, an injection of cortisone medication is given to the mother 1 to 2 days before the delivery. This helps the baby's lungs mature and makes it safer to deliver the baby. CHANGES OCCURING IN THE THIRD TRIMESTER OF PREGNANCY Your body goes through many changes during pregnancy. They vary from person to person. Talk to your caregiver about changes you notice and are concerned about.  During the last trimester, you have probably had an increase in your appetite. It is normal to have cravings for certain foods. This varies from person to person and pregnancy to pregnancy.  You may begin to get stretch marks on your hips, abdomen, and breasts. These are normal changes in the body   during pregnancy. There are no exercises or medications to take which prevent this change.  Constipation may be treated with a stool softener or adding bulk to your diet. Drinking lots of fluids, fiber in vegetables, fruits, and whole grains are helpful.  Exercising is also helpful. If you have been very active up until your pregnancy, most of these activities can be continued during your pregnancy. If you have been less active, it is helpful to start an  exercise program such as walking. Consult your caregiver before starting exercise programs.  Avoid all smoking, alcohol, un-prescribed drugs, herbs and "street drugs" during your pregnancy. These chemicals affect the formation and growth of the baby. Avoid chemicals throughout the pregnancy to ensure the delivery of a healthy infant.  Backache, varicose veins and hemorrhoids may develop or get worse.  You will tire more easily in the third trimester, which is normal.  The baby's movements may be stronger and more often.  You may become short of breath easily.  Your belly button may stick out.  A yellow discharge may leak from your breasts called colostrum.  You may have a bloody mucus discharge. This usually occurs a few days to a week before labor begins. HOME CARE INSTRUCTIONS   Keep your caregiver's appointments. Follow your caregiver's instructions regarding medication use, exercise, and diet.  During pregnancy, you are providing food for you and your baby. Continue to eat regular, well-balanced meals. Choose foods such as meat, fish, milk and other low fat dairy products, vegetables, fruits, and whole-grain breads and cereals. Your caregiver will tell you of the ideal weight gain.  A physical sexual relationship may be continued throughout pregnancy if there are no other problems such as early (premature) leaking of amniotic fluid from the membranes, vaginal bleeding, or belly (abdominal) pain.  Exercise regularly if there are no restrictions. Check with your caregiver if you are unsure of the safety of your exercises. Greater weight gain will occur in the last 2 trimesters of pregnancy. Exercising helps:  Control your weight.  Get you in shape for labor and delivery.  You lose weight after you deliver.  Rest a lot with legs elevated, or as needed for leg cramps or low back pain.  Wear a good support or jogging bra for breast tenderness during pregnancy. This may help if worn  during sleep. Pads or tissues may be used in the bra if you are leaking colostrum.  Do not use hot tubs, steam rooms, or saunas.  Wear your seat belt when driving. This protects you and your baby if you are in an accident.  Avoid raw meat, cat litter boxes and soil used by cats. These carry germs that can cause birth defects in the baby.  It is easier to loose urine during pregnancy. Tightening up and strengthening the pelvic muscles will help with this problem. You can practice stopping your urination while you are going to the bathroom. These are the same muscles you need to strengthen. It is also the muscles you would use if you were trying to stop from passing gas. You can practice tightening these muscles up 10 times a set and repeating this about 3 times per day. Once you know what muscles to tighten up, do not perform these exercises during urination. It is more likely to cause an infection by backing up the urine.  Ask for help if you have financial, counseling or nutritional needs during pregnancy. Your caregiver will be able to offer counseling for these   needs as well as refer you for other special needs.  Make a list of emergency phone numbers and have them available.  Plan on getting help from family or friends when you go home from the hospital.  Make a trial run to the hospital.  Take prenatal classes with the father to understand, practice and ask questions about the labor and delivery.  Prepare the baby's room/nursery.  Do not travel out of the city unless it is absolutely necessary and with the advice of your caregiver.  Wear only low or no heal shoes to have better balance and prevent falling. MEDICATIONS AND DRUG USE IN PREGNANCY  Take prenatal vitamins as directed. The vitamin should contain 1 milligram of folic acid. Keep all vitamins out of reach of children. Only a couple vitamins or tablets containing iron may be fatal to a baby or young child when  ingested.  Avoid use of all medications, including herbs, over-the-counter medications, not prescribed or suggested by your caregiver. Only take over-the-counter or prescription medicines for pain, discomfort, or fever as directed by your caregiver. Do not use aspirin, ibuprofen (Motrin, Advil, Nuprin) or naproxen (Aleve) unless OK'd by your caregiver.  Let your caregiver also know about herbs you may be using.  Alcohol is related to a number of birth defects. This includes fetal alcohol syndrome. All alcohol, in any form, should be avoided completely. Smoking will cause low birth rate and premature babies.  Street/illegal drugs are very harmful to the baby. They are absolutely forbidden. A baby born to an addicted mother will be addicted at birth. The baby will go through the same withdrawal an adult does. SEEK MEDICAL CARE IF: You have any concerns or worries during your pregnancy. It is better to call with your questions if you feel they cannot wait, rather than worry about them. DECISIONS ABOUT CIRCUMCISION You may or may not know the sex of your baby. If you know your baby is a boy, it may be time to think about circumcision. Circumcision is the removal of the foreskin of the penis. This is the skin that covers the sensitive end of the penis. There is no proven medical need for this. Often this decision is made on what is popular at the time or based upon religious beliefs and social issues. You can discuss these issues with your caregiver or pediatrician. SEEK IMMEDIATE MEDICAL CARE IF:   An unexplained oral temperature above 102 F (38.9 C) develops, or as your caregiver suggests.  You have leaking of fluid from the vagina (birth canal). If leaking membranes are suspected, take your temperature and tell your caregiver of this when you call.  There is vaginal spotting, bleeding or passing clots. Tell your caregiver of the amount and how many pads are used.  You develop a bad smelling  vaginal discharge with a change in the color from clear to white.  You develop vomiting that lasts more than 24 hours.  You develop chills or fever.  You develop shortness of breath.  You develop burning on urination.  You loose more than 2 pounds of weight or gain more than 2 pounds of weight or as suggested by your caregiver.  You notice sudden swelling of your face, hands, and feet or legs.  You develop belly (abdominal) pain. Round ligament discomfort is a common non-cancerous (benign) cause of abdominal pain in pregnancy. Your caregiver still must evaluate you.  You develop a severe headache that does not go away.  You develop visual   problems, blurred or double vision.  If you have not felt your baby move for more than 1 hour. If you think the baby is not moving as much as usual, eat something with sugar in it and lie down on your left side for an hour. The baby should move at least 4 to 5 times per hour. Call right away if your baby moves less than that.  You fall, are in a car accident or any kind of trauma.  There is mental or physical violence at home. Document Released: 09/29/2001 Document Revised: 12/28/2011 Document Reviewed: 04/03/2009 [employer] Patient Information 2013 [employer].  Breastfeeding Deciding to breastfeed is one of the Trick choices you can make for you and your baby. The information that follows gives a brief overview of the benefits of breastfeeding as well as common topics surrounding breastfeeding. BENEFITS OF BREASTFEEDING For the baby  The first milk (colostrum) helps the baby's digestive system function better.   There are antibodies in the mother's milk that help the baby fight off infections.   The baby has a lower incidence of asthma, allergies, and sudden infant death syndrome (SIDS).   The nutrients in breast milk are better for the baby than infant formulas, and breast milk helps the baby's brain grow better.   Babies who  breastfeed have less gas, colic, and constipation.  For the mother  Breastfeeding helps develop a very special bond between the mother and her baby.   Breastfeeding is convenient, always available at the correct temperature, and costs nothing.   Breastfeeding burns calories in the mother and helps her lose weight that was gained during pregnancy.   Breastfeeding makes the uterus contract back down to normal size faster and slows bleeding following delivery.   Breastfeeding mothers have a lower risk of developing breast cancer.  BREASTFEEDING FREQUENCY  A healthy, full-term baby may breastfeed as often as every hour or space his or her feedings to every 3 hours.   Watch your baby for signs of hunger. Nurse your baby if he or she shows signs of hunger. How often you nurse will vary from baby to baby.   Nurse as often as the baby requests, or when you feel the need to reduce the fullness of your breasts.   Awaken the baby if it has been 3 4 hours since the last feeding.   Frequent feeding will help the mother make more milk and will help prevent problems, such as sore nipples and engorgement of the breasts.  BABY'S POSITION AT THE BREAST  Whether lying down or sitting, be sure that the baby's tummy is facing your tummy.   Support the breast with 4 fingers underneath the breast and the thumb above. Make sure your fingers are well away from the nipple and baby's mouth.   Stroke the baby's lips gently with your finger or nipple.   When the baby's mouth is open wide enough, place all of your nipple and as much of the areola as possible into your baby's mouth.   Pull the baby in close so the tip of the nose and the baby's cheeks touch the breast during the feeding.  FEEDINGS AND SUCTION  The length of each feeding varies from baby to baby and from feeding to feeding.   The baby must suck about 2 3 minutes for your milk to get to him or her. This is called a "let down."  For this reason, allow the baby to feed on each breast as   long as he or she wants. Your baby will end the feeding when he or she has received the right balance of nutrients.   To break the suction, put your finger into the corner of the baby's mouth and slide it between his or her gums before removing your breast from his or her mouth. This will help prevent sore nipples.  HOW TO TELL WHETHER YOUR BABY IS GETTING ENOUGH BREAST MILK. Wondering whether or not your baby is getting enough milk is a common concern among mothers. You can be assured that your baby is getting enough milk if:   Your baby is actively sucking and you hear swallowing.   Your baby seems relaxed and satisfied after a feeding.   Your baby nurses at least 8 12 times in a 24 hour time period. Nurse your baby until he or she unlatches or falls asleep at the first breast (at least 10 20 minutes), then offer the second side.   Your baby is wetting 5 6 disposable diapers (6 8 cloth diapers) in a 24 hour period by 5 6 days of age.   Your baby is having at least 3 4 stools every 24 hours for the first 6 weeks. The stool should be soft and yellow.   Your baby should gain 4 7 ounces per week after he or she is 4 days old.   Your breasts feel softer after nursing.  REDUCING BREAST ENGORGEMENT  In the first week after your baby is born, you may experience signs of breast engorgement. When breasts are engorged, they feel heavy, warm, full, and may be tender to the touch. You can reduce engorgement if you:   Nurse frequently, every 2 3 hours. Mothers who breastfeed early and often have fewer problems with engorgement.   Place light ice packs on your breasts for 10 20 minutes between feedings. This reduces swelling. Wrap the ice packs in a lightweight towel to protect your skin. Bags of frozen vegetables work well for this purpose.   Take a warm shower or apply warm, moist heat to your breast for 5 10 minutes just before  each feeding. This increases circulation and helps the milk flow.   Gently massage your breast before and during the feeding. Using your finger tips, massage from the chest wall towards your nipple in a circular motion.   Make sure that the baby empties at least one breast at every feeding before switching sides.   Use a breast pump to empty the breasts if your baby is sleepy or not nursing well. You may also want to pump if you are returning to work oryou feel you are getting engorged.   Avoid bottle feeds, pacifiers, or supplemental feedings of water or juice in place of breastfeeding. Breast milk is all the food your baby needs. It is not necessary for your baby to have water or formula. In fact, to help your breasts make more milk, it is Sickles not to give your baby supplemental feedings during the early weeks.   Be sure the baby is latched on and positioned properly while breastfeeding.   Wear a supportive bra, avoiding underwire styles.   Eat a balanced diet with enough fluids.   Rest often, relax, and take your prenatal vitamins to prevent fatigue, stress, and anemia.  If you follow these suggestions, your engorgement should improve in 24 48 hours. If you are still experiencing difficulty, call your lactation consultant or caregiver.  CARING FOR YOURSELF Take care of your   breasts  Bathe or shower daily.   Avoid using soap on your nipples.   Start feedings on your left breast at one feeding and on your right breast at the next feeding.   You will notice an increase in your milk supply 2 5 days after delivery. You may feel some discomfort from engorgement, which makes your breasts very firm and often tender. Engorgement "peaks" out within 24 48 hours. In the meantime, apply warm moist towels to your breasts for 5 10 minutes before feeding. Gentle massage and expression of some milk before feeding will soften your breasts, making it easier for your baby to latch on.    Wear a well-fitting nursing bra, and air dry your nipples for a 3 4minutes after each feeding.   Only use cotton bra pads.   Only use pure lanolin on your nipples after nursing. You do not need to wash it off before feeding the baby again. Another option is to express a few drops of breast milk and gently massage it into your nipples.  Take care of yourself  Eat well-balanced meals and nutritious snacks.   Drinking milk, fruit juice, and water to satisfy your thirst (about 8 glasses a day).   Get plenty of rest.  Avoid foods that you notice affect the baby in a bad way.  SEEK MEDICAL CARE IF:   You have difficulty with breastfeeding and need help.   You have a hard, red, sore area on your breast that is accompanied by a fever.   Your baby is too sleepy to eat well or is having trouble sleeping.   Your baby is wetting less than 6 diapers a day, by 5 days of age.   Your baby's skin or white part of his or her eyes is more yellow than it was in the hospital.   You feel depressed.  Document Released: 10/05/2005 Document Revised: 04/05/2012 Document Reviewed: 01/03/2012 [employer] Patient Information 2013 [employer].  

## 2012-12-28 NOTE — Progress Notes (Signed)
[id number]=[phone] 

## 2012-12-28 NOTE — Progress Notes (Signed)
Needs anatomy u/s AFP at next visit.

## 2013-01-17 ENCOUNTER — Ambulatory Visit (HOSPITAL_COMMUNITY)
Admission: RE | Admit: 2013-01-17 | Discharge: 2013-01-17 | Disposition: A | Discharge status: Home / Self Care | Payer: BC Managed Care – PPO | Source: Ambulatory Visit | Attending: Family Medicine | Admitting: Family Medicine

## 2013-01-17 ENCOUNTER — Encounter: Payer: Self-pay | Admitting: Family Medicine

## 2013-01-17 DIAGNOSIS — O99210 Obesity complicating pregnancy, unspecified trimester: Secondary | ICD-10-CM | POA: Insufficient documentation

## 2013-01-17 DIAGNOSIS — Z3482 Encounter for supervision of other normal pregnancy, second trimester: Secondary | ICD-10-CM

## 2013-01-17 DIAGNOSIS — E669 Obesity, unspecified: Secondary | ICD-10-CM | POA: Insufficient documentation

## 2013-01-17 DIAGNOSIS — Z1389 Encounter for screening for other disorder: Secondary | ICD-10-CM | POA: Insufficient documentation

## 2013-01-17 DIAGNOSIS — O358XX0 Maternal care for other (suspected) fetal abnormality and damage, not applicable or unspecified: Secondary | ICD-10-CM | POA: Insufficient documentation

## 2013-01-17 DIAGNOSIS — Z363 Encounter for antenatal screening for malformations: Secondary | ICD-10-CM | POA: Insufficient documentation

## 2013-01-17 DIAGNOSIS — O34219 Maternal care for unspecified type scar from previous cesarean delivery: Secondary | ICD-10-CM

## 2013-01-18 ENCOUNTER — Ambulatory Visit (HOSPITAL_COMMUNITY): Payer: BC Managed Care – PPO

## 2013-01-24 ENCOUNTER — Encounter: Payer: Self-pay | Admitting: Family Medicine

## 2013-01-24 ENCOUNTER — Ambulatory Visit (INDEPENDENT_AMBULATORY_CARE_PROVIDER_SITE_OTHER): Payer: BC Managed Care – PPO | Admitting: Family Medicine

## 2013-01-24 VITALS — BP 128/98 | Wt 298.0 lb

## 2013-01-24 DIAGNOSIS — Z3482 Encounter for supervision of other normal pregnancy, second trimester: Secondary | ICD-10-CM

## 2013-01-24 DIAGNOSIS — Z3480 Encounter for supervision of other normal pregnancy, unspecified trimester: Secondary | ICD-10-CM

## 2013-01-24 NOTE — Progress Notes (Signed)
Doing ok--fatigued but working on a new house. AFP today First screen and anatomy U/s are WNL

## 2013-01-24 NOTE — Progress Notes (Signed)
Very fatigued

## 2013-01-24 NOTE — Patient Instructions (Signed)
Pregnancy - Second Trimester The second trimester of pregnancy (3 to 6 months) is a period of rapid growth for you and your baby. At the end of the sixth month, your baby is about 9 inches long and weighs 1 1/2 pounds. You will begin to feel the baby move between 18 and 20 weeks of the pregnancy. This is called quickening. Weight gain is faster. A clear fluid (colostrum) may leak out of your breasts. You may feel small contractions of the womb (uterus). This is known as false labor or Braxton-Hicks contractions. This is like a practice for labor when the baby is ready to be born. Usually, the problems with morning sickness have usually passed by the end of your first trimester. Some women develop small dark blotches (called cholasma, mask of pregnancy) on their face that usually goes away after the baby is born. Exposure to the sun makes the blotches worse. Acne may also develop in some pregnant women and pregnant women who have acne, may find that it goes away. PRENATAL EXAMS  Blood work may continue to be done during prenatal exams. These tests are done to check on your health and the probable health of your baby. Blood work is used to follow your blood levels (hemoglobin). Anemia (low hemoglobin) is common during pregnancy. Iron and vitamins are given to help prevent this. You will also be checked for diabetes between 24 and 28 weeks of the pregnancy. Some of the previous blood tests may be repeated.  The size of the uterus is measured during each visit. This is to make sure that the baby is continuing to grow properly according to the dates of the pregnancy.  Your blood pressure is checked every prenatal visit. This is to make sure you are not getting toxemia.  Your urine is checked to make sure you do not have an infection, diabetes or protein in the urine.  Your weight is checked often to make sure gains are happening at the suggested rate. This is to ensure that both you and your baby are  growing normally.  Sometimes, an ultrasound is performed to confirm the proper growth and development of the baby. This is a test which bounces harmless sound waves off the baby so your caregiver can more accurately determine due dates. Sometimes, a specialized test is done on the amniotic fluid surrounding the baby. This test is called an amniocentesis. The amniotic fluid is obtained by sticking a needle into the belly (abdomen). This is done to check the chromosomes in instances where there is a concern about possible genetic problems with the baby. It is also sometimes done near the end of pregnancy if an early delivery is required. In this case, it is done to help make sure the baby's lungs are mature enough for the baby to live outside of the womb. CHANGES OCCURING IN THE SECOND TRIMESTER OF PREGNANCY Your body goes through many changes during pregnancy. They vary from person to person. Talk to your caregiver about changes you notice that you are concerned about.  During the second trimester, you will likely have an increase in your appetite. It is normal to have cravings for certain foods. This varies from person to person and pregnancy to pregnancy.  Your lower abdomen will begin to bulge.  You may have to urinate more often because the uterus and baby are pressing on your bladder. It is also common to get more bladder infections during pregnancy (pain with urination). You can help this by   drinking lots of fluids and emptying your bladder before and after intercourse.  You may begin to get stretch marks on your hips, abdomen, and breasts. These are normal changes in the body during pregnancy. There are no exercises or medications to take that prevent this change.  You may begin to develop swollen and bulging veins (varicose veins) in your legs. Wearing support hose, elevating your feet for 15 minutes, 3 to 4 times a day and limiting salt in your diet helps lessen the problem.  Heartburn may  develop as the uterus grows and pushes up against the stomach. Antacids recommended by your caregiver helps with this problem. Also, eating smaller meals 4 to 5 times a day helps.  Constipation can be treated with a stool softener or adding bulk to your diet. Drinking lots of fluids, vegetables, fruits, and whole grains are helpful.  Exercising is also helpful. If you have been very active up until your pregnancy, most of these activities can be continued during your pregnancy. If you have been less active, it is helpful to start an exercise program such as walking.  Hemorrhoids (varicose veins in the rectum) may develop at the end of the second trimester. Warm sitz baths and hemorrhoid cream recommended by your caregiver helps hemorrhoid problems.  Backaches may develop during this time of your pregnancy. Avoid heavy lifting, wear low heal shoes and practice good posture to help with backache problems.  Some pregnant women develop tingling and numbness of their hand and fingers because of swelling and tightening of ligaments in the wrist (carpel tunnel syndrome). This goes away after the baby is born.  As your breasts enlarge, you may have to get a bigger bra. Get a comfortable, cotton, support bra. Do not get a nursing bra until the last month of the pregnancy if you will be nursing the baby.  You may get a dark line from your belly button to the pubic area called the linea nigra.  You may develop rosy cheeks because of increase blood flow to the face.  You may develop spider looking lines of the face, neck, arms and chest. These go away after the baby is born. HOME CARE INSTRUCTIONS   It is extremely important to avoid all smoking, herbs, alcohol, and unprescribed drugs during your pregnancy. These chemicals affect the formation and growth of the baby. Avoid these chemicals throughout the pregnancy to ensure the delivery of a healthy infant.  Most of your home care instructions are the same  as suggested for the first trimester of your pregnancy. Keep your caregiver's appointments. Follow your caregiver's instructions regarding medication use, exercise and diet.  During pregnancy, you are providing food for you and your baby. Continue to eat regular, well-balanced meals. Choose foods such as meat, fish, milk and other low fat dairy products, vegetables, fruits, and whole-grain breads and cereals. Your caregiver will tell you of the ideal weight gain.  A physical sexual relationship may be continued up until near the end of pregnancy if there are no other problems. Problems could include early (premature) leaking of amniotic fluid from the membranes, vaginal bleeding, abdominal pain, or other medical or pregnancy problems.  Exercise regularly if there are no restrictions. Check with your caregiver if you are unsure of the safety of some of your exercises. The greatest weight gain will occur in the last 2 trimesters of pregnancy. Exercise will help you:  Control your weight.  Get you in shape for labor and delivery.  Lose weight   after you have the baby.  Wear a good support or jogging bra for breast tenderness during pregnancy. This may help if worn during sleep. Pads or tissues may be used in the bra if you are leaking colostrum.  Do not use hot tubs, steam rooms or saunas throughout the pregnancy.  Wear your seat belt at all times when driving. This protects you and your baby if you are in an accident.  Avoid raw meat, uncooked cheese, cat litter boxes and soil used by cats. These carry germs that can cause birth defects in the baby.  The second trimester is also a good time to visit your dentist for your dental health if this has not been done yet. Getting your teeth cleaned is OK. Use a soft toothbrush. Brush gently during pregnancy.  It is easier to loose urine during pregnancy. Tightening up and strengthening the pelvic muscles will help with this problem. Practice stopping  your urination while you are going to the bathroom. These are the same muscles you need to strengthen. It is also the muscles you would use as if you were trying to stop from passing gas. You can practice tightening these muscles up 10 times a set and repeating this about 3 times per day. Once you know what muscles to tighten up, do not perform these exercises during urination. It is more likely to contribute to an infection by backing up the urine.  Ask for help if you have financial, counseling or nutritional needs during pregnancy. Your caregiver will be able to offer counseling for these needs as well as refer you for other special needs.  Your skin may become oily. If so, wash your face with mild soap, use non-greasy moisturizer and oil or cream based makeup. MEDICATIONS AND DRUG USE IN PREGNANCY  Take prenatal vitamins as directed. The vitamin should contain 1 milligram of folic acid. Keep all vitamins out of reach of children. Only a couple vitamins or tablets containing iron may be fatal to a baby or young child when ingested.  Avoid use of all medications, including herbs, over-the-counter medications, not prescribed or suggested by your caregiver. Only take over-the-counter or prescription medicines for pain, discomfort, or fever as directed by your caregiver. Do not use aspirin.  Let your caregiver also know about herbs you may be using.  Alcohol is related to a number of birth defects. This includes fetal alcohol syndrome. All alcohol, in any form, should be avoided completely. Smoking will cause low birth rate and premature babies.  Street or illegal drugs are very harmful to the baby. They are absolutely forbidden. A baby born to an addicted mother will be addicted at birth. The baby will go through the same withdrawal an adult does. SEEK MEDICAL CARE IF:  You have any concerns or worries during your pregnancy. It is better to call with your questions if you feel they cannot wait,  rather than worry about them. SEEK IMMEDIATE MEDICAL CARE IF:   An unexplained oral temperature above 102 F (38.9 C) develops, or as your caregiver suggests.  You have leaking of fluid from the vagina (birth canal). If leaking membranes are suspected, take your temperature and tell your caregiver of this when you call.  There is vaginal spotting, bleeding, or passing clots. Tell your caregiver of the amount and how many pads are used. Light spotting in pregnancy is common, especially following intercourse.  You develop a bad smelling vaginal discharge with a change in the color from clear   to white.  You continue to feel sick to your stomach (nauseated) and have no relief from remedies suggested. You vomit blood or coffee ground-like materials.  You lose more than 2 pounds of weight or gain more than 2 pounds of weight over 1 week, or as suggested by your caregiver.  You notice swelling of your face, hands, feet, or legs.  You get exposed to German measles and have never had them.  You are exposed to fifth disease or chickenpox.  You develop belly (abdominal) pain. Round ligament discomfort is a common non-cancerous (benign) cause of abdominal pain in pregnancy. Your caregiver still must evaluate you.  You develop a bad headache that does not go away.  You develop fever, diarrhea, pain with urination, or shortness of breath.  You develop visual problems, blurry, or double vision.  You fall or are in a car accident or any kind of trauma.  There is mental or physical violence at home. Document Released: 09/29/2001 Document Revised: 12/28/2011 Document Reviewed: 04/03/2009 [employer] Patient Information 2013 [employer].  Breastfeeding Deciding to breastfeed is one of the [place] choices you can make for you and your baby. The information that follows gives a brief overview of the benefits of breastfeeding as well as common topics surrounding breastfeeding. BENEFITS OF  BREASTFEEDING For the baby  The first milk (colostrum) helps the baby's digestive system function better.   There are antibodies in the mother's milk that help the baby fight off infections.   The baby has a lower incidence of asthma, allergies, and sudden infant death syndrome (SIDS).   The nutrients in breast milk are better for the baby than infant formulas, and breast milk helps the baby's brain grow better.   Babies who breastfeed have less gas, colic, and constipation.  For the mother  Breastfeeding helps develop a very special bond between the mother and her baby.   Breastfeeding is convenient, always available at the correct temperature, and costs nothing.   Breastfeeding burns calories in the mother and helps her lose weight that was gained during pregnancy.   Breastfeeding makes the uterus contract back down to normal size faster and slows bleeding following delivery.   Breastfeeding mothers have a lower risk of developing breast cancer.  BREASTFEEDING FREQUENCY  A healthy, full-term baby may breastfeed as often as every hour or space his or her feedings to every 3 hours.   Watch your baby for signs of hunger. Nurse your baby if he or she shows signs of hunger. How often you nurse will vary from baby to baby.   Nurse as often as the baby requests, or when you feel the need to reduce the fullness of your breasts.   Awaken the baby if it has been 3 4 hours since the last feeding.   Frequent feeding will help the mother make more milk and will help prevent problems, such as sore nipples and engorgement of the breasts.  BABY'S POSITION AT THE BREAST  Whether lying down or sitting, be sure that the baby's tummy is facing your tummy.   Support the breast with 4 fingers underneath the breast and the thumb above. Make sure your fingers are well away from the nipple and baby's mouth.   Stroke the baby's lips gently with your finger or nipple.   When the  baby's mouth is open wide enough, place all of your nipple and as much of the areola as possible into your baby's mouth.   Pull the baby in   close so the tip of the nose and the baby's cheeks touch the breast during the feeding.  FEEDINGS AND SUCTION  The length of each feeding varies from baby to baby and from feeding to feeding.   The baby must suck about 2 3 minutes for your milk to get to him or her. This is called a "let down." For this reason, allow the baby to feed on each breast as long as he or she wants. Your baby will end the feeding when he or she has received the right balance of nutrients.   To break the suction, put your finger into the corner of the baby's mouth and slide it between his or her gums before removing your breast from his or her mouth. This will help prevent sore nipples.  HOW TO TELL WHETHER YOUR BABY IS GETTING ENOUGH BREAST MILK. Wondering whether or not your baby is getting enough milk is a common concern among mothers. You can be assured that your baby is getting enough milk if:   Your baby is actively sucking and you hear swallowing.   Your baby seems relaxed and satisfied after a feeding.   Your baby nurses at least 8 12 times in a 24 hour time period. Nurse your baby until he or she unlatches or falls asleep at the first breast (at least 10 20 minutes), then offer the second side.   Your baby is wetting 5 6 disposable diapers (6 8 cloth diapers) in a 24 hour period by 5 6 days of age.   Your baby is having at least 3 4 stools every 24 hours for the first 6 weeks. The stool should be soft and yellow.   Your baby should gain 4 7 ounces per week after he or she is 4 days old.   Your breasts feel softer after nursing.  REDUCING BREAST ENGORGEMENT  In the first week after your baby is born, you may experience signs of breast engorgement. When breasts are engorged, they feel heavy, warm, full, and may be tender to the touch. You can reduce  engorgement if you:   Nurse frequently, every 2 3 hours. Mothers who breastfeed early and often have fewer problems with engorgement.   Place light ice packs on your breasts for 10 20 minutes between feedings. This reduces swelling. Wrap the ice packs in a lightweight towel to protect your skin. Bags of frozen vegetables work well for this purpose.   Take a warm shower or apply warm, moist heat to your breast for 5 10 minutes just before each feeding. This increases circulation and helps the milk flow.   Gently massage your breast before and during the feeding. Using your finger tips, massage from the chest wall towards your nipple in a circular motion.   Make sure that the baby empties at least one breast at every feeding before switching sides.   Use a breast pump to empty the breasts if your baby is sleepy or not nursing well. You may also want to pump if you are returning to work oryou feel you are getting engorged.   Avoid bottle feeds, pacifiers, or supplemental feedings of water or juice in place of breastfeeding. Breast milk is all the food your baby needs. It is not necessary for your baby to have water or formula. In fact, to help your breasts make more milk, it is Dority not to give your baby supplemental feedings during the early weeks.   Be sure the baby is latched   on and positioned properly while breastfeeding.   Wear a supportive bra, avoiding underwire styles.   Eat a balanced diet with enough fluids.   Rest often, relax, and take your prenatal vitamins to prevent fatigue, stress, and anemia.  If you follow these suggestions, your engorgement should improve in 24 48 hours. If you are still experiencing difficulty, call your lactation consultant or caregiver.  CARING FOR YOURSELF Take care of your breasts  Bathe or shower daily.   Avoid using soap on your nipples.   Start feedings on your left breast at one feeding and on your right breast at the next  feeding.   You will notice an increase in your milk supply 2 5 days after delivery. You may feel some discomfort from engorgement, which makes your breasts very firm and often tender. Engorgement "peaks" out within 24 48 hours. In the meantime, apply warm moist towels to your breasts for 5 10 minutes before feeding. Gentle massage and expression of some milk before feeding will soften your breasts, making it easier for your baby to latch on.   Wear a well-fitting nursing bra, and air dry your nipples for a 3 4minutes after each feeding.   Only use cotton bra pads.   Only use pure lanolin on your nipples after nursing. You do not need to wash it off before feeding the baby again. Another option is to express a few drops of breast milk and gently massage it into your nipples.  Take care of yourself  Eat well-balanced meals and nutritious snacks.   Drinking milk, fruit juice, and water to satisfy your thirst (about 8 glasses a day).   Get plenty of rest.  Avoid foods that you notice affect the baby in a bad way.  SEEK MEDICAL CARE IF:   You have difficulty with breastfeeding and need help.   You have a hard, red, sore area on your breast that is accompanied by a fever.   Your baby is too sleepy to eat well or is having trouble sleeping.   Your baby is wetting less than 6 diapers a day, by 5 days of age.   Your baby's skin or white part of his or her eyes is more yellow than it was in the hospital.   You feel depressed.  Document Released: 10/05/2005 Document Revised: 04/05/2012 Document Reviewed: 01/03/2012 [employer] Patient Information 2013 [employer].  

## 2013-01-27 LAB — ALPHA FETOPROTEIN, MATERNAL
AFP: 23.6 IU/mL
Curr Gest Age: 19.2 wks.days
MoM for AFP: 0.85
Open Spina bifida: NEGATIVE
Osb Risk: 1:22600 {titer}

## 2013-01-31 ENCOUNTER — Encounter: Payer: Self-pay | Admitting: Family Medicine

## 2013-02-06 ENCOUNTER — Telehealth: Payer: Self-pay | Admitting: *Deleted

## 2013-02-06 DIAGNOSIS — J019 Acute sinusitis, unspecified: Secondary | ICD-10-CM

## 2013-02-06 MED ORDER — TRIAMCINOLONE ACETONIDE(NASAL) 55 MCG/ACT NA INHA: 2.0000 | Freq: Every day | NASAL | Status: DC

## 2013-02-06 MED ORDER — AZITHROMYCIN 250 MG PO TABS: ORAL_TABLET | ORAL | Status: DC

## 2013-02-06 NOTE — Telephone Encounter (Signed)
Patient has had a cold for a week and a half and it has now turned into a sinus infection.  She is having low grade fever and would like to have something called in.

## 2013-02-20 ENCOUNTER — Ambulatory Visit (INDEPENDENT_AMBULATORY_CARE_PROVIDER_SITE_OTHER): Payer: BC Managed Care – PPO | Admitting: Obstetrics & Gynecology

## 2013-02-20 ENCOUNTER — Encounter: Payer: Self-pay | Admitting: Obstetrics & Gynecology

## 2013-02-20 VITALS — BP 127/83 | Wt 301.0 lb

## 2013-02-20 DIAGNOSIS — E669 Obesity, unspecified: Secondary | ICD-10-CM

## 2013-02-20 DIAGNOSIS — O34219 Maternal care for unspecified type scar from previous cesarean delivery: Secondary | ICD-10-CM

## 2013-02-20 DIAGNOSIS — Z3482 Encounter for supervision of other normal pregnancy, second trimester: Secondary | ICD-10-CM

## 2013-02-20 DIAGNOSIS — Z3480 Encounter for supervision of other normal pregnancy, unspecified trimester: Secondary | ICD-10-CM

## 2013-02-20 NOTE — Progress Notes (Signed)
Routine visit. Good FM. PTL precautions reviewed. Glucola at next visit. 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 RBS today is 95. She no longer has her glucometer at home. Glucola at next visit.

## 2013-02-20 NOTE — Progress Notes (Signed)
Feels like she is having some irregular contractions that were starting in her back and coming around front.  Lasted one day and it seemed to taper off.

## 2013-03-21 ENCOUNTER — Ambulatory Visit (INDEPENDENT_AMBULATORY_CARE_PROVIDER_SITE_OTHER): Payer: BC Managed Care – PPO | Admitting: Family Medicine

## 2013-03-21 ENCOUNTER — Encounter: Payer: Self-pay | Admitting: Family Medicine

## 2013-03-21 VITALS — BP 131/82 | Wt 307.0 lb

## 2013-03-21 DIAGNOSIS — Z23 Encounter for immunization: Secondary | ICD-10-CM

## 2013-03-21 DIAGNOSIS — O34219 Maternal care for unspecified type scar from previous cesarean delivery: Secondary | ICD-10-CM

## 2013-03-21 DIAGNOSIS — Z3480 Encounter for supervision of other normal pregnancy, unspecified trimester: Secondary | ICD-10-CM

## 2013-03-21 MED ORDER — TETANUS-DIPHTH-ACELL PERTUSSIS 5-2.5-18.5 LF-MCG/0.5 IM SUSP: 0.5000 mL | Freq: Once | INTRAMUSCULAR | Status: DC

## 2013-03-21 NOTE — Progress Notes (Signed)
1 hour, 28 wk labs and TDaP today

## 2013-03-21 NOTE — Patient Instructions (Signed)
Pregnancy - Third Trimester The third trimester of pregnancy (the last 3 months) is a period of the most rapid growth for you and your baby. The baby approaches a length of 20 inches and a weight of 6 to 10 pounds. The baby is adding on fat and getting ready for life outside your body. While inside, babies have periods of sleeping and waking, sucking thumbs, and hiccuping. You can often feel small contractions of the uterus. This is false labor. It is also called Braxton-Hicks contractions. This is like a practice for labor. The usual problems in this stage of pregnancy include more difficulty breathing, swelling of the hands and feet from water retention, and having to urinate more often because of the uterus and baby pressing on your bladder.  PRENATAL EXAMS  Blood work may continue to be done during prenatal exams. These tests are done to check on your health and the probable health of your baby. Blood work is used to follow your blood levels (hemoglobin). Anemia (low hemoglobin) is common during pregnancy. Iron and vitamins are given to help prevent this. You may also continue to be checked for diabetes. Some of the past blood tests may be done again.  The size of the uterus is measured during each visit. This makes sure your baby is growing properly according to your pregnancy dates.  Your blood pressure is checked every prenatal visit. This is to make sure you are not getting toxemia.  Your urine is checked every prenatal visit for infection, diabetes, and protein.  Your weight is checked at each visit. This is done to make sure gains are happening at the suggested rate and that you and your baby are growing normally.  Sometimes, an ultrasound is performed to confirm the position and the proper growth and development of the baby. This is a test done that bounces harmless sound waves off the baby so your caregiver can more accurately determine a due date.  Discuss the type of pain medicine and  anesthesia you will have during your labor and delivery.  Discuss the possibility and anesthesia if a cesarean section might be necessary.  Inform your caregiver if there is any mental or physical violence at home. Sometimes, a specialized non-stress test, contraction stress test, and biophysical profile are done to make sure the baby is not having a problem. Checking the amniotic fluid surrounding the baby is called an amniocentesis. The amniotic fluid is removed by sticking a needle into the belly (abdomen). This is sometimes done near the end of pregnancy if an early delivery is required. In this case, it is done to help make sure the baby's lungs are mature enough for the baby to live outside of the womb. If the lungs are not mature and it is unsafe to deliver the baby, an injection of cortisone medicine is given to the mother 1 to 2 days before the delivery. This helps the baby's lungs mature and makes it safer to deliver the baby. CHANGES OCCURING IN THE THIRD TRIMESTER OF PREGNANCY Your body goes through many changes during pregnancy. They vary from person to person. Talk to your caregiver about changes you notice and are concerned about.  During the last trimester, you have probably had an increase in your appetite. It is normal to have cravings for certain foods. This varies from person to person and pregnancy to pregnancy.  You may begin to get stretch marks on your hips, abdomen, and breasts. These are normal changes in the body   during pregnancy. There are no exercises or medicines to take which prevent this change.  Constipation may be treated with a stool softener or adding bulk to your diet. Drinking lots of fluids, fiber in vegetables, fruits, and whole grains are helpful.  Exercising is also helpful. If you have been very active up until your pregnancy, most of these activities can be continued during your pregnancy. If you have been less active, it is helpful to start an exercise  program such as walking. Consult your caregiver before starting exercise programs.  Avoid all smoking, alcohol, non-prescribed drugs, herbs and "street drugs" during your pregnancy. These chemicals affect the formation and growth of the baby. Avoid chemicals throughout the pregnancy to ensure the delivery of a healthy infant.  Backache, varicose veins, and hemorrhoids may develop or get worse.  You will tire more easily in the third trimester, which is normal.  The baby's movements may be stronger and more often.  You may become short of breath easily.  Your belly button may stick out.  A yellow discharge may leak from your breasts called colostrum.  You may have a bloody mucus discharge. This usually occurs a few days to a week before labor begins. HOME CARE INSTRUCTIONS   Keep your caregiver's appointments. Follow your caregiver's instructions regarding medicine use, exercise, and diet.  During pregnancy, you are providing food for you and your baby. Continue to eat regular, well-balanced meals. Choose foods such as meat, fish, milk and other low fat dairy products, vegetables, fruits, and whole-grain breads and cereals. Your caregiver will tell you of the ideal weight gain.  A physical sexual relationship may be continued throughout pregnancy if there are no other problems such as early (premature) leaking of amniotic fluid from the membranes, vaginal bleeding, or belly (abdominal) pain.  Exercise regularly if there are no restrictions. Check with your caregiver if you are unsure of the safety of your exercises. Greater weight gain will occur in the last 2 trimesters of pregnancy. Exercising helps:  Control your weight.  Get you in shape for labor and delivery.  You lose weight after you deliver.  Rest a lot with legs elevated, or as needed for leg cramps or low back pain.  Wear a good support or jogging bra for breast tenderness during pregnancy. This may help if worn during  sleep. Pads or tissues may be used in the bra if you are leaking colostrum.  Do not use hot tubs, steam rooms, or saunas.  Wear your seat belt when driving. This protects you and your baby if you are in an accident.  Avoid raw meat, cat litter boxes and soil used by cats. These carry germs that can cause birth defects in the baby.  It is easier to leak urine during pregnancy. Tightening up and strengthening the pelvic muscles will help with this problem. You can practice stopping your urination while you are going to the bathroom. These are the same muscles you need to strengthen. It is also the muscles you would use if you were trying to stop from passing gas. You can practice tightening these muscles up 10 times a set and repeating this about 3 times per day. Once you know what muscles to tighten up, do not perform these exercises during urination. It is more likely to cause an infection by backing up the urine.  Ask for help if you have financial, counseling, or nutritional needs during pregnancy. Your caregiver will be able to offer counseling for these   needs as well as refer you for other special needs.  Make a list of emergency phone numbers and have them available.  Plan on getting help from family or friends when you go home from the hospital.  Make a trial run to the hospital.  Take prenatal classes with the father to understand, practice, and ask questions about the labor and delivery.  Prepare the baby's room or nursery.  Do not travel out of the city unless it is absolutely necessary and with the advice of your caregiver.  Wear only low or no heal shoes to have better balance and prevent falling. MEDICINES AND DRUG USE IN PREGNANCY  Take prenatal vitamins as directed. The vitamin should contain 1 milligram of folic acid. Keep all vitamins out of reach of children. Only a couple vitamins or tablets containing iron may be fatal to a baby or young child when ingested.  Avoid use  of all medicines, including herbs, over-the-counter medicines, not prescribed or suggested by your caregiver. Only take over-the-counter or prescription medicines for pain, discomfort, or fever as directed by your caregiver. Do not use aspirin, ibuprofen or naproxen unless approved by your caregiver.  Let your caregiver also know about herbs you may be using.  Alcohol is related to a number of birth defects. This includes fetal alcohol syndrome. All alcohol, in any form, should be avoided completely. Smoking will cause low birth rate and premature babies.  Illegal drugs are very harmful to the baby. They are absolutely forbidden. A baby born to an addicted mother will be addicted at birth. The baby will go through the same withdrawal an adult does. SEEK MEDICAL CARE IF: You have any concerns or worries during your pregnancy. It is better to call with your questions if you feel they cannot wait, rather than worry about them. SEEK IMMEDIATE MEDICAL CARE IF:   An unexplained oral temperature above 102 F (38.9 C) develops, or as your caregiver suggests.  You have leaking of fluid from the vagina. If leaking membranes are suspected, take your temperature and tell your caregiver of this when you call.  There is vaginal spotting, bleeding or passing clots. Tell your caregiver of the amount and how many pads are used.  You develop a bad smelling vaginal discharge with a change in the color from clear to white.  You develop vomiting that lasts more than 24 hours.  You develop chills or fever.  You develop shortness of breath.  You develop burning on urination.  You loose more than 2 pounds of weight or gain more than 2 pounds of weight or as suggested by your caregiver.  You notice sudden swelling of your face, hands, and feet or legs.  You develop belly (abdominal) pain. Round ligament discomfort is a common non-cancerous (benign) cause of abdominal pain in pregnancy. Your caregiver still  must evaluate you.  You develop a severe headache that does not go away.  You develop visual problems, blurred or double vision.  If you have not felt your baby move for more than 1 hour. If you think the baby is not moving as much as usual, eat something with sugar in it and lie down on your left side for an hour. The baby should move at least 4 to 5 times per hour. Call right away if your baby moves less than that.  You fall, are in a car accident, or any kind of trauma.  There is mental or physical violence at home. Document Released: 09/29/2001   Document Revised: 06/29/2012 Document Reviewed: 04/03/2009 [employer] Patient Information 2014 [employer].  Breastfeeding A change in hormones during your pregnancy causes growth of your breast tissue and an increase in number and size of milk ducts. The hormone prolactin allows proteins, sugars, and fats from your blood supply to make breast milk in your milk-producing glands. The hormone progesterone prevents breast milk from being released before the birth of your baby. After the birth of your baby, your progesterone level decreases allowing breast milk to be released. Thoughts of your baby, as well as his or her sucking or crying, can stimulate the release of milk from the milk-producing glands. Deciding to breastfeed (nurse) is one of the [place] choices you can make for you and your baby. The information that follows gives a brief review of the benefits, as well as other important skills to know about breastfeeding. BENEFITS OF BREASTFEEDING For your baby  The first milk (colostrum) helps your baby's digestive system function better.   There are antibodies in your milk that help your baby fight off infections.   Your baby has a lower incidence of asthma, allergies, and sudden infant death syndrome (SIDS).   The nutrients in breast milk are better for your baby than infant formulas.  Breast milk improves your baby's brain development.    Your baby will have less gas, colic, and constipation.  Your baby is less likely to develop other conditions, such as childhood obesity, asthma, or diabetes mellitus. For you  Breastfeeding helps develop a very special bond between you and your baby.   Breastfeeding is convenient, always available at the correct temperature, and costs nothing.   Breastfeeding helps to burn calories and helps you lose the weight gained during pregnancy.   Breastfeeding makes your uterus contract back down to normal size faster and slows bleeding following delivery.   Breastfeeding mothers have a lower risk of developing osteoporosis or breast or ovarian cancer later in life.  BREASTFEEDING FREQUENCY  A healthy, full-term baby may breastfeed as often as every hour or space his or her feedings to every 3 hours. Breastfeeding frequency will vary from baby to baby.   Newborns should be fed no less than every 2 3 hours during the day and every 4 5 hours during the night. You should breastfeed a minimum of 8 feedings in a 24 hour period.  Awaken your baby to breastfeed if it has been 3 4 hours since the last feeding.  Breastfeed when you feel the need to reduce the fullness of your breasts or when your newborn shows signs of hunger. Signs that your baby may be hungry include:  Increased alertness or activity.  Stretching.  Movement of the head from side to side.  Movement of the head and opening of the mouth when the corner of the mouth or cheek is stroked (rooting).  Increased sucking sounds, smacking lips, cooing, sighing, or squeaking.  Hand-to-mouth movements.  Increased sucking of fingers or hands.  Fussing.  Intermittent crying.  Signs of extreme hunger will require calming and consoling before you try to feed your baby. Signs of extreme hunger may include:  Restlessness.  A loud, strong cry.  Screaming.  Frequent feeding will help you make more milk and will help prevent  problems, such as sore nipples and engorgement of the breasts.  BREASTFEEDING   Whether lying down or sitting, be sure that the baby's abdomen is facing your abdomen.   Support your breast with 4 fingers under your breast   and your thumb above your nipple. Make sure your fingers are well away from your nipple and your baby's mouth.   Stroke your baby's lips gently with your finger or nipple.   When your baby's mouth is open wide enough, place all of your nipple and as much of the colored area around your nipple (areola) as possible into your baby's mouth.  More areola should be visible above his or her upper lip than below his or her lower lip.  Your baby's tongue should be between his or her lower gum and your breast.  Ensure that your baby's mouth is correctly positioned around the nipple (latched). Your baby's lips should create a seal on your breast.  Signs that your baby has effectively latched onto your nipple include:  Tugging or sucking without pain.  Swallowing heard between sucks.  Absent click or smacking sound.  Muscle movement above and in front of his or her ears with sucking.  Your baby must suck about 2 3 minutes in order to get your milk. Allow your baby to feed on each breast as long as he or she wants. Nurse your baby until he or she unlatches or falls asleep at the first breast, then offer the second breast.  Signs that your baby is full and satisfied include:  A gradual decrease in the number of sucks or complete cessation of sucking.  Falling asleep.  Extension or relaxation of his or her body.  Retention of a small amount of milk in his or her mouth.  Letting go of your breast by himself or herself.  Signs of effective breastfeeding in you include:  Breasts that have increased firmness, weight, and size prior to feeding.  Breasts that are softer after nursing.  Increased milk volume, as well as a change in milk consistency and color by the 5th  day of breastfeeding.  Breast fullness relieved by breastfeeding.  Nipples are not sore, cracked, or bleeding.  If needed, break the suction by putting your finger into the corner of your baby's mouth and sliding your finger between his or her gums. Then, remove your breast from his or her mouth.  It is common for babies to spit up a small amount after a feeding.  Babies often swallow air during feeding. This can make babies fussy. Burping your baby between breasts can help with this.  Vitamin D supplements are recommended for babies who get only breast milk.  Avoid using a pacifier during your baby's first 4 6 weeks.  Avoid supplemental feedings of water, formula, or juice in place of breastfeeding. Breast milk is all the food your baby needs. It is not necessary for your baby to have water or formula. Your breasts will make more milk if supplemental feedings are avoided during the early weeks. HOW TO TELL WHETHER YOUR BABY IS GETTING ENOUGH BREAST MILK Wondering whether or not your baby is getting enough milk is a common concern among mothers. You can be assured that your baby is getting enough milk if:   Your baby is actively sucking and you hear swallowing.   Your baby seems relaxed and satisfied after a feeding.   Your baby nurses at least 8 12 times in a 24 hour time period.  During the first 3 5 days of age:  Your baby is wetting at least 3 5 diapers in a 24 hour period. The urine should be clear and pale yellow.  Your baby is having at least 3 4 stools in   a 24 hour period. The stool should be soft and yellow.  At 5 7 days of age, your baby is having at least 3 6 stools in a 24 hour period. The stool should be seedy and yellow by 5 days of age.  Your baby has a weight loss less than 7 10% during the first 3 days of age.  Your baby does not lose weight after 3 7 days of age.  Your baby gains 4 7 ounces each week after he or she is 4 days of age.  Your baby gains weight  by 5 days of age and is back to birth weight within 2 weeks. ENGORGEMENT In the first week after your baby is born, you may experience extremely full breasts (engorgement). When engorged, your breasts may feel heavy, warm, or tender to the touch. Engorgement peaks within 24 48 hours after delivery of your baby.  Engorgement may be reduced by:  Continuing to breastfeed.  Increasing the frequency of breastfeeding.  Taking warm showers or applying warm, moist heat to your breasts just before each feeding. This increases circulation and helps the milk flow.   Gently massaging your breast before and during the feedings. With your fingertips, massage from your chest wall towards your nipple in a circular motion.   Ensuring that your baby empties at least one breast at every feeding. It also helps to start the next feeding on the opposite breast.   Expressing breast milk by hand or by using a breast pump to empty the breasts if your baby is sleepy, or not nursing well. You may also want to express milk if you are returning to work oryou feel you are getting engorged.  Ensuring your baby is latched on and positioned properly while breastfeeding. If you follow these suggestions, your engorgement should improve in 24 48 hours. If you are still experiencing difficulty, call your lactation consultant or caregiver.  CARING FOR YOURSELF Take care of your breasts.  Bathe or shower daily.   Avoid using soap on your nipples.   Wear a supportive bra. Avoid wearing underwire style bras.  Air dry your nipples for a 3 4minutes after each feeding.   Use only cotton bra pads to absorb breast milk leakage. Leaking of breast milk between feedings is normal.   Use only pure lanolin on your nipples after nursing. You do not need to wash it off before feeding your baby again. Another option is to express a few drops of breast milk and gently massage that milk into your nipples.  Continue breast  self-awareness checks. Take care of yourself.  Eat healthy foods. Alternate 3 meals with 3 snacks.  Avoid foods that you notice affect your baby in a bad way.  Drink milk, fruit juice, and water to satisfy your thirst (about 8 glasses a day).   Rest often, relax, and take your prenatal vitamins to prevent fatigue, stress, and anemia.  Avoid chewing and smoking tobacco.  Avoid alcohol and drug use.  Take over-the-counter and prescribed medicine only as directed by your caregiver or pharmacist. You should always check with your caregiver or pharmacist before taking any new medicine, vitamin, or herbal supplement.  Know that pregnancy is possible while breastfeeding. If desired, talk to your caregiver about family planning and safe birth control methods that may be used while breastfeeding. SEEK MEDICAL CARE IF:   You feel like you want to stop breastfeeding or have become frustrated with breastfeeding.  You have painful breasts or nipples.    Your nipples are cracked or bleeding.  Your breasts are red, tender, or warm.  You have a swollen area on either breast.  You have a fever or chills.  You have nausea or vomiting.  You have drainage from your nipples.  Your breasts do not become full before feedings by the 5th day after delivery.  You feel sad and depressed.  Your baby is too sleepy to eat well.  Your baby is having trouble sleeping.   Your baby is wetting less than 3 diapers in a 24 hour period.  Your baby has less than 3 stools in a 24 hour period.  Your baby's skin or the white part of his or her eyes becomes more yellow.   Your baby is not gaining weight by 5 days of age. MAKE SURE YOU:   Understand these instructions.  Will watch your condition.  Will get help right away if you are not doing well or get worse. Document Released: 10/05/2005 Document Revised: 06/29/2012 Document Reviewed: 05/11/2012 [employer] Patient Information 2014 [employer].  

## 2013-03-22 LAB — CBC
HCT: 35.8 % — ABNORMAL LOW (ref 36.0–46.0)
Hemoglobin: 12 g/dL (ref 12.0–15.0)
MCH: 28.6 pg (ref 26.0–34.0)
MCHC: 33.5 g/dL (ref 30.0–36.0)
MCV: 85.2 fL (ref 78.0–100.0)
Platelets: 194 10*3/uL (ref 150–400)
RBC: 4.2 MIL/uL (ref 3.87–5.11)
RDW: 13.3 % (ref 11.5–15.5)
WBC: 8 10*3/uL (ref 4.0–10.5)

## 2013-03-22 LAB — HIV ANTIBODY (ROUTINE TESTING W REFLEX): HIV: NONREACTIVE

## 2013-03-22 LAB — RPR

## 2013-03-22 LAB — GLUCOSE TOLERANCE, 1 HOUR (50G) W/O FASTING: Glucose, 1 Hour GTT: 109 mg/dL (ref 70–140)

## 2013-03-23 ENCOUNTER — Inpatient Hospital Stay (HOSPITAL_COMMUNITY)
Admission: AD | Admit: 2013-03-23 | Discharge: 2013-03-24 | Disposition: A | Discharge status: Home / Self Care | Payer: BC Managed Care – PPO | Source: Ambulatory Visit | Attending: Obstetrics & Gynecology | Admitting: Obstetrics & Gynecology

## 2013-03-23 ENCOUNTER — Encounter (HOSPITAL_COMMUNITY): Payer: Self-pay

## 2013-03-23 DIAGNOSIS — W501XXA Accidental kick by another person, initial encounter: Secondary | ICD-10-CM

## 2013-03-23 DIAGNOSIS — O09299 Supervision of pregnancy with other poor reproductive or obstetric history, unspecified trimester: Secondary | ICD-10-CM

## 2013-03-23 DIAGNOSIS — O34219 Maternal care for unspecified type scar from previous cesarean delivery: Secondary | ICD-10-CM

## 2013-03-23 DIAGNOSIS — O4700 False labor before 37 completed weeks of gestation, unspecified trimester: Secondary | ICD-10-CM | POA: Insufficient documentation

## 2013-03-23 DIAGNOSIS — Z3480 Encounter for supervision of other normal pregnancy, unspecified trimester: Secondary | ICD-10-CM

## 2013-03-23 LAB — URINALYSIS, ROUTINE W REFLEX MICROSCOPIC
Bilirubin Urine: NEGATIVE
Glucose, UA: NEGATIVE mg/dL
Hgb urine dipstick: NEGATIVE
Ketones, ur: NEGATIVE mg/dL
Leukocytes, UA: NEGATIVE
Nitrite: NEGATIVE
Protein, ur: NEGATIVE mg/dL
Specific Gravity, Urine: <1.005 — ABNORMAL LOW (ref 1.005–1.030)
Urobilinogen, UA: 0.2 mg/dL (ref 0.0–1.0)
pH: 6 (ref 5.0–8.0)
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
## 2013-03-23 NOTE — MAU Note (Signed)
Contracting every 15 minutes since 8pm tonight. Denies leaking of fluid or vaginal bleeding. Positive fetal movement.
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
## 2013-03-24 ENCOUNTER — Inpatient Hospital Stay (HOSPITAL_COMMUNITY): Payer: BC Managed Care – PPO

## 2013-03-24 DIAGNOSIS — O4700 False labor before 37 completed weeks of gestation, unspecified trimester: Secondary | ICD-10-CM

## 2013-03-24 LAB — FETAL FIBRONECTIN: Fetal Fibronectin: NEGATIVE

## 2013-04-07 ENCOUNTER — Ambulatory Visit (INDEPENDENT_AMBULATORY_CARE_PROVIDER_SITE_OTHER): Payer: BC Managed Care – PPO | Admitting: Family Medicine

## 2013-04-07 VITALS — BP 141/86 | Wt 311.0 lb

## 2013-04-07 DIAGNOSIS — Z3480 Encounter for supervision of other normal pregnancy, unspecified trimester: Secondary | ICD-10-CM

## 2013-04-07 DIAGNOSIS — O26713 Subluxation of symphysis (pubis) in pregnancy, third trimester: Secondary | ICD-10-CM

## 2013-04-07 DIAGNOSIS — O9989 Other specified diseases and conditions complicating pregnancy, childbirth and the puerperium: Secondary | ICD-10-CM

## 2013-04-07 MED ORDER — HYDROCODONE-ACETAMINOPHEN 5-300 MG PO TABS: 1.0000 | ORAL_TABLET | Freq: Four times a day (QID) | ORAL | Status: DC | PRN

## 2013-04-07 NOTE — Progress Notes (Signed)
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  Maternity belt.  Vicoding at hs if needed.  Passed 1 hour.  BP up today after coffee.  BP is good at home. Will monitor.

## 2013-04-07 NOTE — Patient Instructions (Addendum)
Pregnancy - Third Trimester The third trimester of pregnancy (the last 3 months) is a period of the most rapid growth for you and your baby. The baby approaches a length of 20 inches and a weight of 6 to 10 pounds. The baby is adding on fat and getting ready for life outside your body. While inside, babies have periods of sleeping and waking, sucking thumbs, and hiccuping. You can often feel small contractions of the uterus. This is false labor. It is also called Braxton-Hicks contractions. This is like a practice for labor. The usual problems in this stage of pregnancy include more difficulty breathing, swelling of the hands and feet from water retention, and having to urinate more often because of the uterus and baby pressing on your bladder.  PRENATAL EXAMS  Blood work may continue to be done during prenatal exams. These tests are done to check on your health and the probable health of your baby. Blood work is used to follow your blood levels (hemoglobin). Anemia (low hemoglobin) is common during pregnancy. Iron and vitamins are given to help prevent this. You may also continue to be checked for diabetes. Some of the past blood tests may be done again.  The size of the uterus is measured during each visit. This makes sure your baby is growing properly according to your pregnancy dates.  Your blood pressure is checked every prenatal visit. This is to make sure you are not getting toxemia.  Your urine is checked every prenatal visit for infection, diabetes, and protein.  Your weight is checked at each visit. This is done to make sure gains are happening at the suggested rate and that you and your baby are growing normally.  Sometimes, an ultrasound is performed to confirm the position and the proper growth and development of the baby. This is a test done that bounces harmless sound waves off the baby so your caregiver can more accurately determine a due date.  Discuss the type of pain medicine and  anesthesia you will have during your labor and delivery.  Discuss the possibility and anesthesia if a cesarean section might be necessary.  Inform your caregiver if there is any mental or physical violence at home. Sometimes, a specialized non-stress test, contraction stress test, and biophysical profile are done to make sure the baby is not having a problem. Checking the amniotic fluid surrounding the baby is called an amniocentesis. The amniotic fluid is removed by sticking a needle into the belly (abdomen). This is sometimes done near the end of pregnancy if an early delivery is required. In this case, it is done to help make sure the baby's lungs are mature enough for the baby to live outside of the womb. If the lungs are not mature and it is unsafe to deliver the baby, an injection of cortisone medicine is given to the mother 1 to 2 days before the delivery. This helps the baby's lungs mature and makes it safer to deliver the baby. CHANGES OCCURING IN THE THIRD TRIMESTER OF PREGNANCY Your body goes through many changes during pregnancy. They vary from person to person. Talk to your caregiver about changes you notice and are concerned about.  During the last trimester, you have probably had an increase in your appetite. It is normal to have cravings for certain foods. This varies from person to person and pregnancy to pregnancy.  You may begin to get stretch marks on your hips, abdomen, and breasts. These are normal changes in the body   during pregnancy. There are no exercises or medicines to take which prevent this change.  Constipation may be treated with a stool softener or adding bulk to your diet. Drinking lots of fluids, fiber in vegetables, fruits, and whole grains are helpful.  Exercising is also helpful. If you have been very active up until your pregnancy, most of these activities can be continued during your pregnancy. If you have been less active, it is helpful to start an exercise  program such as walking. Consult your caregiver before starting exercise programs.  Avoid all smoking, alcohol, non-prescribed drugs, herbs and "street drugs" during your pregnancy. These chemicals affect the formation and growth of the baby. Avoid chemicals throughout the pregnancy to ensure the delivery of a healthy infant.  Backache, varicose veins, and hemorrhoids may develop or get worse.  You will tire more easily in the third trimester, which is normal.  The baby's movements may be stronger and more often.  You may become short of breath easily.  Your belly button may stick out.  A yellow discharge may leak from your breasts called colostrum.  You may have a bloody mucus discharge. This usually occurs a few days to a week before labor begins. HOME CARE INSTRUCTIONS   Keep your caregiver's appointments. Follow your caregiver's instructions regarding medicine use, exercise, and diet.  During pregnancy, you are providing food for you and your baby. Continue to eat regular, well-balanced meals. Choose foods such as meat, fish, milk and other low fat dairy products, vegetables, fruits, and whole-grain breads and cereals. Your caregiver will tell you of the ideal weight gain.  A physical sexual relationship may be continued throughout pregnancy if there are no other problems such as early (premature) leaking of amniotic fluid from the membranes, vaginal bleeding, or belly (abdominal) pain.  Exercise regularly if there are no restrictions. Check with your caregiver if you are unsure of the safety of your exercises. Greater weight gain will occur in the last 2 trimesters of pregnancy. Exercising helps:  Control your weight.  Get you in shape for labor and delivery.  You lose weight after you deliver.  Rest a lot with legs elevated, or as needed for leg cramps or low back pain.  Wear a good support or jogging bra for breast tenderness during pregnancy. This may help if worn during  sleep. Pads or tissues may be used in the bra if you are leaking colostrum.  Do not use hot tubs, steam rooms, or saunas.  Wear your seat belt when driving. This protects you and your baby if you are in an accident.  Avoid raw meat, cat litter boxes and soil used by cats. These carry germs that can cause birth defects in the baby.  It is easier to leak urine during pregnancy. Tightening up and strengthening the pelvic muscles will help with this problem. You can practice stopping your urination while you are going to the bathroom. These are the same muscles you need to strengthen. It is also the muscles you would use if you were trying to stop from passing gas. You can practice tightening these muscles up 10 times a set and repeating this about 3 times per day. Once you know what muscles to tighten up, do not perform these exercises during urination. It is more likely to cause an infection by backing up the urine.  Ask for help if you have financial, counseling, or nutritional needs during pregnancy. Your caregiver will be able to offer counseling for these   needs as well as refer you for other special needs.  Make a list of emergency phone numbers and have them available.  Plan on getting help from family or friends when you go home from the hospital.  Make a trial run to the hospital.  Take prenatal classes with the father to understand, practice, and ask questions about the labor and delivery.  Prepare the baby's room or nursery.  Do not travel out of the city unless it is absolutely necessary and with the advice of your caregiver.  Wear only low or no heal shoes to have better balance and prevent falling. MEDICINES AND DRUG USE IN PREGNANCY  Take prenatal vitamins as directed. The vitamin should contain 1 milligram of folic acid. Keep all vitamins out of reach of children. Only a couple vitamins or tablets containing iron may be fatal to a baby or young child when ingested.  Avoid use  of all medicines, including herbs, over-the-counter medicines, not prescribed or suggested by your caregiver. Only take over-the-counter or prescription medicines for pain, discomfort, or fever as directed by your caregiver. Do not use aspirin, ibuprofen or naproxen unless approved by your caregiver.  Let your caregiver also know about herbs you may be using.  Alcohol is related to a number of birth defects. This includes fetal alcohol syndrome. All alcohol, in any form, should be avoided completely. Smoking will cause low birth rate and premature babies.  Illegal drugs are very harmful to the baby. They are absolutely forbidden. A baby born to an addicted mother will be addicted at birth. The baby will go through the same withdrawal an adult does. SEEK MEDICAL CARE IF: You have any concerns or worries during your pregnancy. It is better to call with your questions if you feel they cannot wait, rather than worry about them. SEEK IMMEDIATE MEDICAL CARE IF:   An unexplained oral temperature above 102 F (38.9 C) develops, or as your caregiver suggests.  You have leaking of fluid from the vagina. If leaking membranes are suspected, take your temperature and tell your caregiver of this when you call.  There is vaginal spotting, bleeding or passing clots. Tell your caregiver of the amount and how many pads are used.  You develop a bad smelling vaginal discharge with a change in the color from clear to white.  You develop vomiting that lasts more than 24 hours.  You develop chills or fever.  You develop shortness of breath.  You develop burning on urination.  You loose more than 2 pounds of weight or gain more than 2 pounds of weight or as suggested by your caregiver.  You notice sudden swelling of your face, hands, and feet or legs.  You develop belly (abdominal) pain. Round ligament discomfort is a common non-cancerous (benign) cause of abdominal pain in pregnancy. Your caregiver still  must evaluate you.  You develop a severe headache that does not go away.  You develop visual problems, blurred or double vision.  If you have not felt your baby move for more than 1 hour. If you think the baby is not moving as much as usual, eat something with sugar in it and lie down on your left side for an hour. The baby should move at least 4 to 5 times per hour. Call right away if your baby moves less than that.  You fall, are in a car accident, or any kind of trauma.  There is mental or physical violence at home. Document Released: 09/29/2001   Document Revised: 06/29/2012 Document Reviewed: 04/03/2009 [employer] Patient Information 2014 [employer].  Breastfeeding A change in hormones during your pregnancy causes growth of your breast tissue and an increase in number and size of milk ducts. The hormone prolactin allows proteins, sugars, and fats from your blood supply to make breast milk in your milk-producing glands. The hormone progesterone prevents breast milk from being released before the birth of your baby. After the birth of your baby, your progesterone level decreases allowing breast milk to be released. Thoughts of your baby, as well as his or her sucking or crying, can stimulate the release of milk from the milk-producing glands. Deciding to breastfeed (nurse) is one of the [place] choices you can make for you and your baby. The information that follows gives a brief review of the benefits, as well as other important skills to know about breastfeeding. BENEFITS OF BREASTFEEDING For your baby  The first milk (colostrum) helps your baby's digestive system function better.   There are antibodies in your milk that help your baby fight off infections.   Your baby has a lower incidence of asthma, allergies, and sudden infant death syndrome (SIDS).   The nutrients in breast milk are better for your baby than infant formulas.  Breast milk improves your baby's brain development.    Your baby will have less gas, colic, and constipation.  Your baby is less likely to develop other conditions, such as childhood obesity, asthma, or diabetes mellitus. For you  Breastfeeding helps develop a very special bond between you and your baby.   Breastfeeding is convenient, always available at the correct temperature, and costs nothing.   Breastfeeding helps to burn calories and helps you lose the weight gained during pregnancy.   Breastfeeding makes your uterus contract back down to normal size faster and slows bleeding following delivery.   Breastfeeding mothers have a lower risk of developing osteoporosis or breast or ovarian cancer later in life.  BREASTFEEDING FREQUENCY  A healthy, full-term baby may breastfeed as often as every hour or space his or her feedings to every 3 hours. Breastfeeding frequency will vary from baby to baby.   Newborns should be fed no less than every 2 3 hours during the day and every 4 5 hours during the night. You should breastfeed a minimum of 8 feedings in a 24 hour period.  Awaken your baby to breastfeed if it has been 3 4 hours since the last feeding.  Breastfeed when you feel the need to reduce the fullness of your breasts or when your newborn shows signs of hunger. Signs that your baby may be hungry include:  Increased alertness or activity.  Stretching.  Movement of the head from side to side.  Movement of the head and opening of the mouth when the corner of the mouth or cheek is stroked (rooting).  Increased sucking sounds, smacking lips, cooing, sighing, or squeaking.  Hand-to-mouth movements.  Increased sucking of fingers or hands.  Fussing.  Intermittent crying.  Signs of extreme hunger will require calming and consoling before you try to feed your baby. Signs of extreme hunger may include:  Restlessness.  A loud, strong cry.  Screaming.  Frequent feeding will help you make more milk and will help prevent  problems, such as sore nipples and engorgement of the breasts.  BREASTFEEDING   Whether lying down or sitting, be sure that the baby's abdomen is facing your abdomen.   Support your breast with 4 fingers under your breast   and your thumb above your nipple. Make sure your fingers are well away from your nipple and your baby's mouth.   Stroke your baby's lips gently with your finger or nipple.   When your baby's mouth is open wide enough, place all of your nipple and as much of the colored area around your nipple (areola) as possible into your baby's mouth.  More areola should be visible above his or her upper lip than below his or her lower lip.  Your baby's tongue should be between his or her lower gum and your breast.  Ensure that your baby's mouth is correctly positioned around the nipple (latched). Your baby's lips should create a seal on your breast.  Signs that your baby has effectively latched onto your nipple include:  Tugging or sucking without pain.  Swallowing heard between sucks.  Absent click or smacking sound.  Muscle movement above and in front of his or her ears with sucking.  Your baby must suck about 2 3 minutes in order to get your milk. Allow your baby to feed on each breast as long as he or she wants. Nurse your baby until he or she unlatches or falls asleep at the first breast, then offer the second breast.  Signs that your baby is full and satisfied include:  A gradual decrease in the number of sucks or complete cessation of sucking.  Falling asleep.  Extension or relaxation of his or her body.  Retention of a small amount of milk in his or her mouth.  Letting go of your breast by himself or herself.  Signs of effective breastfeeding in you include:  Breasts that have increased firmness, weight, and size prior to feeding.  Breasts that are softer after nursing.  Increased milk volume, as well as a change in milk consistency and color by the 5th  day of breastfeeding.  Breast fullness relieved by breastfeeding.  Nipples are not sore, cracked, or bleeding.  If needed, break the suction by putting your finger into the corner of your baby's mouth and sliding your finger between his or her gums. Then, remove your breast from his or her mouth.  It is common for babies to spit up a small amount after a feeding.  Babies often swallow air during feeding. This can make babies fussy. Burping your baby between breasts can help with this.  Vitamin D supplements are recommended for babies who get only breast milk.  Avoid using a pacifier during your baby's first 4 6 weeks.  Avoid supplemental feedings of water, formula, or juice in place of breastfeeding. Breast milk is all the food your baby needs. It is not necessary for your baby to have water or formula. Your breasts will make more milk if supplemental feedings are avoided during the early weeks. HOW TO TELL WHETHER YOUR BABY IS GETTING ENOUGH BREAST MILK Wondering whether or not your baby is getting enough milk is a common concern among mothers. You can be assured that your baby is getting enough milk if:   Your baby is actively sucking and you hear swallowing.   Your baby seems relaxed and satisfied after a feeding.   Your baby nurses at least 8 12 times in a 24 hour time period.  During the first 3 5 days of age:  Your baby is wetting at least 3 5 diapers in a 24 hour period. The urine should be clear and pale yellow.  Your baby is having at least 3 4 stools in   a 24 hour period. The stool should be soft and yellow.  At 5 7 days of age, your baby is having at least 3 6 stools in a 24 hour period. The stool should be seedy and yellow by 5 days of age.  Your baby has a weight loss less than 7 10% during the first 3 days of age.  Your baby does not lose weight after 3 7 days of age.  Your baby gains 4 7 ounces each week after he or she is 4 days of age.  Your baby gains weight  by 5 days of age and is back to birth weight within 2 weeks. ENGORGEMENT In the first week after your baby is born, you may experience extremely full breasts (engorgement). When engorged, your breasts may feel heavy, warm, or tender to the touch. Engorgement peaks within 24 48 hours after delivery of your baby.  Engorgement may be reduced by:  Continuing to breastfeed.  Increasing the frequency of breastfeeding.  Taking warm showers or applying warm, moist heat to your breasts just before each feeding. This increases circulation and helps the milk flow.   Gently massaging your breast before and during the feedings. With your fingertips, massage from your chest wall towards your nipple in a circular motion.   Ensuring that your baby empties at least one breast at every feeding. It also helps to start the next feeding on the opposite breast.   Expressing breast milk by hand or by using a breast pump to empty the breasts if your baby is sleepy, or not nursing well. You may also want to express milk if you are returning to work oryou feel you are getting engorged.  Ensuring your baby is latched on and positioned properly while breastfeeding. If you follow these suggestions, your engorgement should improve in 24 48 hours. If you are still experiencing difficulty, call your lactation consultant or caregiver.  CARING FOR YOURSELF Take care of your breasts.  Bathe or shower daily.   Avoid using soap on your nipples.   Wear a supportive bra. Avoid wearing underwire style bras.  Air dry your nipples for a 3 4minutes after each feeding.   Use only cotton bra pads to absorb breast milk leakage. Leaking of breast milk between feedings is normal.   Use only pure lanolin on your nipples after nursing. You do not need to wash it off before feeding your baby again. Another option is to express a few drops of breast milk and gently massage that milk into your nipples.  Continue breast  self-awareness checks. Take care of yourself.  Eat healthy foods. Alternate 3 meals with 3 snacks.  Avoid foods that you notice affect your baby in a bad way.  Drink milk, fruit juice, and water to satisfy your thirst (about 8 glasses a day).   Rest often, relax, and take your prenatal vitamins to prevent fatigue, stress, and anemia.  Avoid chewing and smoking tobacco.  Avoid alcohol and drug use.  Take over-the-counter and prescribed medicine only as directed by your caregiver or pharmacist. You should always check with your caregiver or pharmacist before taking any new medicine, vitamin, or herbal supplement.  Know that pregnancy is possible while breastfeeding. If desired, talk to your caregiver about family planning and safe birth control methods that may be used while breastfeeding. SEEK MEDICAL CARE IF:   You feel like you want to stop breastfeeding or have become frustrated with breastfeeding.  You have painful breasts or nipples.    Your nipples are cracked or bleeding.  Your breasts are red, tender, or warm.  You have a swollen area on either breast.  You have a fever or chills.  You have nausea or vomiting.  You have drainage from your nipples.  Your breasts do not become full before feedings by the 5th day after delivery.  You feel sad and depressed.  Your baby is too sleepy to eat well.  Your baby is having trouble sleeping.   Your baby is wetting less than 3 diapers in a 24 hour period.  Your baby has less than 3 stools in a 24 hour period.  Your baby's skin or the white part of his or her eyes becomes more yellow.   Your baby is not gaining weight by 5 days of age. MAKE SURE YOU:   Understand these instructions.  Will watch your condition.  Will get help right away if you are not doing well or get worse. Document Released: 10/05/2005 Document Revised: 06/29/2012 Document Reviewed: 05/11/2012 [employer] Patient Information 2014 [employer].  

## 2013-04-07 NOTE — Progress Notes (Signed)
P-[id number].   Pt not sleeping well and having pelvic pain.

## 2013-04-24 ENCOUNTER — Ambulatory Visit (INDEPENDENT_AMBULATORY_CARE_PROVIDER_SITE_OTHER): Payer: BC Managed Care – PPO | Admitting: Family Medicine

## 2013-04-24 ENCOUNTER — Encounter: Payer: Self-pay | Admitting: Family Medicine

## 2013-04-24 VITALS — BP 136/90 | Wt 317.0 lb

## 2013-04-24 DIAGNOSIS — Z3480 Encounter for supervision of other normal pregnancy, unspecified trimester: Secondary | ICD-10-CM

## 2013-04-24 DIAGNOSIS — Z3483 Encounter for supervision of other normal pregnancy, third trimester: Secondary | ICD-10-CM

## 2013-04-24 NOTE — Progress Notes (Signed)
BP up at home now--6# weight gain. Getting dehydrated easily. Will check BP at home and here several times.  If still up, consider labs, u/s

## 2013-04-24 NOTE — Progress Notes (Signed)
P-[id number] 

## 2013-04-24 NOTE — Patient Instructions (Signed)
Preeclampsia and Eclampsia Preeclampsia is a condition of high blood pressure during pregnancy. It can happen at 20 weeks or later in pregnancy. If high blood pressure occurs in the second half of pregnancy with no other symptoms, it is called gestational hypertension and goes away after the baby is born. If any of the symptoms listed below develop with gestational hypertension, it is then called preeclampsia. Eclampsia (convulsions) may follow preeclampsia. This is one of the reasons for regular prenatal checkups. Early diagnosis and treatment are very important to prevent eclampsia. CAUSES  There is no known cause of preeclampsia/eclampsia in pregnancy. There are several known conditions that may put the pregnant woman at risk, such as:  The first pregnancy.  Having preeclampsia in a past pregnancy.  Having lasting (chronic) high blood pressure.  Having multiples (twins, triplets).  Being age 35 or older.  African American ethnic background.  Having kidney disease or diabetes.  Medical conditions such as lupus or blood diseases.  Being overweight (obese). SYMPTOMS   High blood pressure.  Headaches.  Sudden weight gain.  Swelling of hands, face, legs, and feet.  Protein in the urine.  Feeling sick to your stomach (nauseous) and throwing up (vomiting).  Vision problems (blurred or double vision).  Numbness in the face, arms, legs, and feet.  Dizziness.  Slurred speech.  Preeclampsia can cause growth retardation in the fetus.  Separation (abruption) of the placenta.  Not enough fluid in the amniotic sac (oligohydramnios).  Sensitivity to bright lights.  Belly (abdominal) pain. DIAGNOSIS  If protein is found in the urine in the second half of pregnancy, this is considered preeclampsia. Other symptoms mentioned above may also be present. TREATMENT  It is necessary to treat this.  Your caregiver may prescribe bed rest early in this condition. Plenty of rest and  salt restriction may be all that is needed.  Medicines may be necessary to lower blood pressure if the condition does not respond to more conservative measures.  In more severe cases, hospitalization may be needed:  For treatment of blood pressure.  To control fluid retention.  To monitor the baby to see if the condition is causing harm to the baby.  Hospitalization is the [place] way to treat the first sign of preeclampsia. This is so the mother and baby can be watched closely and blood tests can be done effectively and correctly.  If the condition becomes severe, it may be necessary to induce labor or to remove the infant by surgical means (cesarean section). The [place] cure for preeclampsia/eclampsia is to deliver the baby. Preeclampsia and eclampsia involve risks to mother and infant. Your caregiver will discuss these risks with you. Together, you can work out the [place] possible approach to your problems. Make sure you keep your prenatal visits as scheduled. Not keeping appointments could result in a chronic or permanent injury, pain, disability to you, and death or injury to you or your unborn baby. If there is any problem keeping the appointment, you must call to reschedule. HOME CARE INSTRUCTIONS   Keep your prenatal appointments and tests as scheduled.  Tell your caregiver if you have any of the above risk factors.  Get plenty of rest and sleep.  Eat a balanced diet that is low in salt, and do not add salt to your food.  Avoid stressful situations.  Only take over-the-counter and prescriptions medicines for pain, discomfort, or fever as directed by your caregiver. SEEK IMMEDIATE MEDICAL CARE IF:   You develop severe swelling   anywhere in the body. This usually occurs in the legs.  You gain 5 lb/2.3 kg or more in a week.  You develop a severe headache, dizziness, problems with your vision, or confusion.  You have abdominal pain, nausea, or vomiting.  You have a seizure.  You  have trouble moving any part of your body, or you develop numbness or problems speaking.  You have bruising or abnormal bleeding from anywhere in the body.  You develop a stiff neck.  You pass out. MAKE SURE YOU:   Understand these instructions.  Will watch your condition.  Will get help right away if you are not doing well or get worse. Document Released: 10/02/2000 Document Revised: 12/28/2011 Document Reviewed: 05/18/2008 [employer] Patient Information 2014 [employer].  Breastfeeding A change in hormones during your pregnancy causes growth of your breast tissue and an increase in number and size of milk ducts. The hormone prolactin allows proteins, sugars, and fats from your blood supply to make breast milk in your milk-producing glands. The hormone progesterone prevents breast milk from being released before the birth of your baby. After the birth of your baby, your progesterone level decreases allowing breast milk to be released. Thoughts of your baby, as well as his or her sucking or crying, can stimulate the release of milk from the milk-producing glands. Deciding to breastfeed (nurse) is one of the [place] choices you can make for you and your baby. The information that follows gives a brief review of the benefits, as well as other important skills to know about breastfeeding. BENEFITS OF BREASTFEEDING For your baby  The first milk (colostrum) helps your baby's digestive system function better.   There are antibodies in your milk that help your baby fight off infections.   Your baby has a lower incidence of asthma, allergies, and sudden infant death syndrome (SIDS).   The nutrients in breast milk are better for your baby than infant formulas.  Breast milk improves your baby's brain development.   Your baby will have less gas, colic, and constipation.  Your baby is less likely to develop other conditions, such as childhood obesity, asthma, or diabetes mellitus. For  you  Breastfeeding helps develop a very special bond between you and your baby.   Breastfeeding is convenient, always available at the correct temperature, and costs nothing.   Breastfeeding helps to burn calories and helps you lose the weight gained during pregnancy.   Breastfeeding makes your uterus contract back down to normal size faster and slows bleeding following delivery.   Breastfeeding mothers have a lower risk of developing osteoporosis or breast or ovarian cancer later in life.  BREASTFEEDING FREQUENCY  A healthy, full-term baby may breastfeed as often as every hour or space his or her feedings to every 3 hours. Breastfeeding frequency will vary from baby to baby.   Newborns should be fed no less than every 2 3 hours during the day and every 4 5 hours during the night. You should breastfeed a minimum of 8 feedings in a 24 hour period.  Awaken your baby to breastfeed if it has been 3 4 hours since the last feeding.  Breastfeed when you feel the need to reduce the fullness of your breasts or when your newborn shows signs of hunger. Signs that your baby may be hungry include:  Increased alertness or activity.  Stretching.  Movement of the head from side to side.  Movement of the head and opening of the mouth when the corner of   the mouth or cheek is stroked (rooting).  Increased sucking sounds, smacking lips, cooing, sighing, or squeaking.  Hand-to-mouth movements.  Increased sucking of fingers or hands.  Fussing.  Intermittent crying.  Signs of extreme hunger will require calming and consoling before you try to feed your baby. Signs of extreme hunger may include:  Restlessness.  A loud, strong cry.  Screaming.  Frequent feeding will help you make more milk and will help prevent problems, such as sore nipples and engorgement of the breasts.  BREASTFEEDING   Whether lying down or sitting, be sure that the baby's abdomen is facing your abdomen.    Support your breast with 4 fingers under your breast and your thumb above your nipple. Make sure your fingers are well away from your nipple and your baby's mouth.   Stroke your baby's lips gently with your finger or nipple.   When your baby's mouth is open wide enough, place all of your nipple and as much of the colored area around your nipple (areola) as possible into your baby's mouth.  More areola should be visible above his or her upper lip than below his or her lower lip.  Your baby's tongue should be between his or her lower gum and your breast.  Ensure that your baby's mouth is correctly positioned around the nipple (latched). Your baby's lips should create a seal on your breast.  Signs that your baby has effectively latched onto your nipple include:  Tugging or sucking without pain.  Swallowing heard between sucks.  Absent click or smacking sound.  Muscle movement above and in front of his or her ears with sucking.  Your baby must suck about 2 3 minutes in order to get your milk. Allow your baby to feed on each breast as long as he or she wants. Nurse your baby until he or she unlatches or falls asleep at the first breast, then offer the second breast.  Signs that your baby is full and satisfied include:  A gradual decrease in the number of sucks or complete cessation of sucking.  Falling asleep.  Extension or relaxation of his or her body.  Retention of a small amount of milk in his or her mouth.  Letting go of your breast by himself or herself.  Signs of effective breastfeeding in you include:  Breasts that have increased firmness, weight, and size prior to feeding.  Breasts that are softer after nursing.  Increased milk volume, as well as a change in milk consistency and color by the 5th day of breastfeeding.  Breast fullness relieved by breastfeeding.  Nipples are not sore, cracked, or bleeding.  If needed, break the suction by putting your finger  into the corner of your baby's mouth and sliding your finger between his or her gums. Then, remove your breast from his or her mouth.  It is common for babies to spit up a small amount after a feeding.  Babies often swallow air during feeding. This can make babies fussy. Burping your baby between breasts can help with this.  Vitamin D supplements are recommended for babies who get only breast milk.  Avoid using a pacifier during your baby's first 4 6 weeks.  Avoid supplemental feedings of water, formula, or juice in place of breastfeeding. Breast milk is all the food your baby needs. It is not necessary for your baby to have water or formula. Your breasts will make more milk if supplemental feedings are avoided during the early weeks. HOW TO   TELL WHETHER YOUR BABY IS GETTING ENOUGH BREAST MILK Wondering whether or not your baby is getting enough milk is a common concern among mothers. You can be assured that your baby is getting enough milk if:   Your baby is actively sucking and you hear swallowing.   Your baby seems relaxed and satisfied after a feeding.   Your baby nurses at least 8 12 times in a 24 hour time period.  During the first 3 5 days of age:  Your baby is wetting at least 3 5 diapers in a 24 hour period. The urine should be clear and pale yellow.  Your baby is having at least 3 4 stools in a 24 hour period. The stool should be soft and yellow.  At 5 7 days of age, your baby is having at least 3 6 stools in a 24 hour period. The stool should be seedy and yellow by 5 days of age.  Your baby has a weight loss less than 7 10% during the first 3 days of age.  Your baby does not lose weight after 3 7 days of age.  Your baby gains 4 7 ounces each week after he or she is 4 days of age.  Your baby gains weight by 5 days of age and is back to birth weight within 2 weeks. ENGORGEMENT In the first week after your baby is born, you may experience extremely full breasts  (engorgement). When engorged, your breasts may feel heavy, warm, or tender to the touch. Engorgement peaks within 24 48 hours after delivery of your baby.  Engorgement may be reduced by:  Continuing to breastfeed.  Increasing the frequency of breastfeeding.  Taking warm showers or applying warm, moist heat to your breasts just before each feeding. This increases circulation and helps the milk flow.   Gently massaging your breast before and during the feedings. With your fingertips, massage from your chest wall towards your nipple in a circular motion.   Ensuring that your baby empties at least one breast at every feeding. It also helps to start the next feeding on the opposite breast.   Expressing breast milk by hand or by using a breast pump to empty the breasts if your baby is sleepy, or not nursing well. You may also want to express milk if you are returning to work oryou feel you are getting engorged.  Ensuring your baby is latched on and positioned properly while breastfeeding. If you follow these suggestions, your engorgement should improve in 24 48 hours. If you are still experiencing difficulty, call your lactation consultant or caregiver.  CARING FOR YOURSELF Take care of your breasts.  Bathe or shower daily.   Avoid using soap on your nipples.   Wear a supportive bra. Avoid wearing underwire style bras.  Air dry your nipples for a 3 4minutes after each feeding.   Use only cotton bra pads to absorb breast milk leakage. Leaking of breast milk between feedings is normal.   Use only pure lanolin on your nipples after nursing. You do not need to wash it off before feeding your baby again. Another option is to express a few drops of breast milk and gently massage that milk into your nipples.  Continue breast self-awareness checks. Take care of yourself.  Eat healthy foods. Alternate 3 meals with 3 snacks.  Avoid foods that you notice affect your baby in a bad  way.  Drink milk, fruit juice, and water to satisfy your thirst (about 8   glasses a day).   Rest often, relax, and take your prenatal vitamins to prevent fatigue, stress, and anemia.  Avoid chewing and smoking tobacco.  Avoid alcohol and drug use.  Take over-the-counter and prescribed medicine only as directed by your caregiver or pharmacist. You should always check with your caregiver or pharmacist before taking any new medicine, vitamin, or herbal supplement.  Know that pregnancy is possible while breastfeeding. If desired, talk to your caregiver about family planning and safe birth control methods that may be used while breastfeeding. SEEK MEDICAL CARE IF:   You feel like you want to stop breastfeeding or have become frustrated with breastfeeding.  You have painful breasts or nipples.  Your nipples are cracked or bleeding.  Your breasts are red, tender, or warm.  You have a swollen area on either breast.  You have a fever or chills.  You have nausea or vomiting.  You have drainage from your nipples.  Your breasts do not become full before feedings by the 5th day after delivery.  You feel sad and depressed.  Your baby is too sleepy to eat well.  Your baby is having trouble sleeping.   Your baby is wetting less than 3 diapers in a 24 hour period.  Your baby has less than 3 stools in a 24 hour period.  Your baby's skin or the white part of his or her eyes becomes more yellow.   Your baby is not gaining weight by 5 days of age. MAKE SURE YOU:   Understand these instructions.  Will watch your condition.  Will get help right away if you are not doing well or get worse. Document Released: 10/05/2005 Document Revised: 06/29/2012 Document Reviewed: 05/11/2012 [employer] Patient Information 2014 [employer].  

## 2013-04-28 ENCOUNTER — Ambulatory Visit (INDEPENDENT_AMBULATORY_CARE_PROVIDER_SITE_OTHER): Payer: BC Managed Care – PPO | Admitting: Family Medicine

## 2013-04-28 VITALS — BP 134/84 | Wt 314.0 lb

## 2013-04-28 DIAGNOSIS — I10 Essential (primary) hypertension: Secondary | ICD-10-CM | POA: Insufficient documentation

## 2013-04-28 DIAGNOSIS — O10013 Pre-existing essential hypertension complicating pregnancy, third trimester: Secondary | ICD-10-CM

## 2013-04-28 DIAGNOSIS — Z3480 Encounter for supervision of other normal pregnancy, unspecified trimester: Secondary | ICD-10-CM

## 2013-04-28 DIAGNOSIS — O10019 Pre-existing essential hypertension complicating pregnancy, unspecified trimester: Secondary | ICD-10-CM | POA: Insufficient documentation

## 2013-04-28 MED ORDER — LABETALOL HCL 100 MG PO TABS: 100.0000 mg | ORAL_TABLET | Freq: Two times a day (BID) | ORAL | Status: DC

## 2013-04-28 NOTE — Patient Instructions (Signed)
Breastfeeding A change in hormones during your pregnancy causes growth of your breast tissue and an increase in number and size of milk ducts. The hormone prolactin allows proteins, sugars, and fats from your blood supply to make breast milk in your milk-producing glands. The hormone progesterone prevents breast milk from being released before the birth of your baby. After the birth of your baby, your progesterone level decreases allowing breast milk to be released. Thoughts of your baby, as well as his or her sucking or crying, can stimulate the release of milk from the milk-producing glands. Deciding to breastfeed (nurse) is one of the [place] choices you can make for you and your baby. The information that follows gives a brief review of the benefits, as well as other important skills to know about breastfeeding. BENEFITS OF BREASTFEEDING For your baby  The first milk (colostrum) helps your baby's digestive system function better.   There are antibodies in your milk that help your baby fight off infections.   Your baby has a lower incidence of asthma, allergies, and sudden infant death syndrome (SIDS).   The nutrients in breast milk are better for your baby than infant formulas.  Breast milk improves your baby's brain development.   Your baby will have less gas, colic, and constipation.  Your baby is less likely to develop other conditions, such as childhood obesity, asthma, or diabetes mellitus. For you  Breastfeeding helps develop a very special bond between you and your baby.   Breastfeeding is convenient, always available at the correct temperature, and costs nothing.   Breastfeeding helps to burn calories and helps you lose the weight gained during pregnancy.   Breastfeeding makes your uterus contract back down to normal size faster and slows bleeding following delivery.   Breastfeeding mothers have a lower risk of developing osteoporosis or breast or ovarian cancer later  in life.  BREASTFEEDING FREQUENCY  A healthy, full-term baby may breastfeed as often as every hour or space his or her feedings to every 3 hours. Breastfeeding frequency will vary from baby to baby.   Newborns should be fed no less than every 2 3 hours during the day and every 4 5 hours during the night. You should breastfeed a minimum of 8 feedings in a 24 hour period.  Awaken your baby to breastfeed if it has been 3 4 hours since the last feeding.  Breastfeed when you feel the need to reduce the fullness of your breasts or when your newborn shows signs of hunger. Signs that your baby may be hungry include:  Increased alertness or activity.  Stretching.  Movement of the head from side to side.  Movement of the head and opening of the mouth when the corner of the mouth or cheek is stroked (rooting).  Increased sucking sounds, smacking lips, cooing, sighing, or squeaking.  Hand-to-mouth movements.  Increased sucking of fingers or hands.  Fussing.  Intermittent crying.  Signs of extreme hunger will require calming and consoling before you try to feed your baby. Signs of extreme hunger may include:  Restlessness.  A loud, strong cry.  Screaming.  Frequent feeding will help you make more milk and will help prevent problems, such as sore nipples and engorgement of the breasts.  BREASTFEEDING   Whether lying down or sitting, be sure that the baby's abdomen is facing your abdomen.   Support your breast with 4 fingers under your breast and your thumb above your nipple. Make sure your fingers are well away from   your nipple and your baby's mouth.   Stroke your baby's lips gently with your finger or nipple.   When your baby's mouth is open wide enough, place all of your nipple and as much of the colored area around your nipple (areola) as possible into your baby's mouth.  More areola should be visible above his or her upper lip than below his or her lower lip.  Your  baby's tongue should be between his or her lower gum and your breast.  Ensure that your baby's mouth is correctly positioned around the nipple (latched). Your baby's lips should create a seal on your breast.  Signs that your baby has effectively latched onto your nipple include:  Tugging or sucking without pain.  Swallowing heard between sucks.  Absent click or smacking sound.  Muscle movement above and in front of his or her ears with sucking.  Your baby must suck about 2 3 minutes in order to get your milk. Allow your baby to feed on each breast as long as he or she wants. Nurse your baby until he or she unlatches or falls asleep at the first breast, then offer the second breast.  Signs that your baby is full and satisfied include:  A gradual decrease in the number of sucks or complete cessation of sucking.  Falling asleep.  Extension or relaxation of his or her body.  Retention of a small amount of milk in his or her mouth.  Letting go of your breast by himself or herself.  Signs of effective breastfeeding in you include:  Breasts that have increased firmness, weight, and size prior to feeding.  Breasts that are softer after nursing.  Increased milk volume, as well as a change in milk consistency and color by the 5th day of breastfeeding.  Breast fullness relieved by breastfeeding.  Nipples are not sore, cracked, or bleeding.  If needed, break the suction by putting your finger into the corner of your baby's mouth and sliding your finger between his or her gums. Then, remove your breast from his or her mouth.  It is common for babies to spit up a small amount after a feeding.  Babies often swallow air during feeding. This can make babies fussy. Burping your baby between breasts can help with this.  Vitamin D supplements are recommended for babies who get only breast milk.  Avoid using a pacifier during your baby's first 4 6 weeks.  Avoid supplemental feedings of  water, formula, or juice in place of breastfeeding. Breast milk is all the food your baby needs. It is not necessary for your baby to have water or formula. Your breasts will make more milk if supplemental feedings are avoided during the early weeks. HOW TO TELL WHETHER YOUR BABY IS GETTING ENOUGH BREAST MILK Wondering whether or not your baby is getting enough milk is a common concern among mothers. You can be assured that your baby is getting enough milk if:   Your baby is actively sucking and you hear swallowing.   Your baby seems relaxed and satisfied after a feeding.   Your baby nurses at least 8 12 times in a 24 hour time period.  During the first 3 5 days of age:  Your baby is wetting at least 3 5 diapers in a 24 hour period. The urine should be clear and pale yellow.  Your baby is having at least 3 4 stools in a 24 hour period. The stool should be soft and yellow.  At   5 7 days of age, your baby is having at least 3 6 stools in a 24 hour period. The stool should be seedy and yellow by 5 days of age.  Your baby has a weight loss less than 7 10% during the first 3 days of age.  Your baby does not lose weight after 3 7 days of age.  Your baby gains 4 7 ounces each week after he or she is 4 days of age.  Your baby gains weight by 5 days of age and is back to birth weight within 2 weeks. ENGORGEMENT In the first week after your baby is born, you may experience extremely full breasts (engorgement). When engorged, your breasts may feel heavy, warm, or tender to the touch. Engorgement peaks within 24 48 hours after delivery of your baby.  Engorgement may be reduced by:  Continuing to breastfeed.  Increasing the frequency of breastfeeding.  Taking warm showers or applying warm, moist heat to your breasts just before each feeding. This increases circulation and helps the milk flow.   Gently massaging your breast before and during the feedings. With your fingertips, massage from  your chest wall towards your nipple in a circular motion.   Ensuring that your baby empties at least one breast at every feeding. It also helps to start the next feeding on the opposite breast.   Expressing breast milk by hand or by using a breast pump to empty the breasts if your baby is sleepy, or not nursing well. You may also want to express milk if you are returning to work oryou feel you are getting engorged.  Ensuring your baby is latched on and positioned properly while breastfeeding. If you follow these suggestions, your engorgement should improve in 24 48 hours. If you are still experiencing difficulty, call your lactation consultant or caregiver.  CARING FOR YOURSELF Take care of your breasts.  Bathe or shower daily.   Avoid using soap on your nipples.   Wear a supportive bra. Avoid wearing underwire style bras.  Air dry your nipples for a 3 4minutes after each feeding.   Use only cotton bra pads to absorb breast milk leakage. Leaking of breast milk between feedings is normal.   Use only pure lanolin on your nipples after nursing. You do not need to wash it off before feeding your baby again. Another option is to express a few drops of breast milk and gently massage that milk into your nipples.  Continue breast self-awareness checks. Take care of yourself.  Eat healthy foods. Alternate 3 meals with 3 snacks.  Avoid foods that you notice affect your baby in a bad way.  Drink milk, fruit juice, and water to satisfy your thirst (about 8 glasses a day).   Rest often, relax, and take your prenatal vitamins to prevent fatigue, stress, and anemia.  Avoid chewing and smoking tobacco.  Avoid alcohol and drug use.  Take over-the-counter and prescribed medicine only as directed by your caregiver or pharmacist. You should always check with your caregiver or pharmacist before taking any new medicine, vitamin, or herbal supplement.  Know that pregnancy is possible while  breastfeeding. If desired, talk to your caregiver about family planning and safe birth control methods that may be used while breastfeeding. SEEK MEDICAL CARE IF:   You feel like you want to stop breastfeeding or have become frustrated with breastfeeding.  You have painful breasts or nipples.  Your nipples are cracked or bleeding.  Your breasts are red, tender,   or warm.  You have a swollen area on either breast.  You have a fever or chills.  You have nausea or vomiting.  You have drainage from your nipples.  Your breasts do not become full before feedings by the 5th day after delivery.  You feel sad and depressed.  Your baby is too sleepy to eat well.  Your baby is having trouble sleeping.   Your baby is wetting less than 3 diapers in a 24 hour period.  Your baby has less than 3 stools in a 24 hour period.  Your baby's skin or the white part of his or her eyes becomes more yellow.   Your baby is not gaining weight by 5 days of age. MAKE SURE YOU:   Understand these instructions.  Will watch your condition.  Will get help right away if you are not doing well or get worse. Document Released: 10/05/2005 Document Revised: 06/29/2012 Document Reviewed: 05/11/2012 [employer] Patient Information 2014 [employer].  

## 2013-04-28 NOTE — Progress Notes (Signed)
BP is very labile 140/88, 120/82, 162/90, 128/82, 130/82, 166/104 at home.  BP up and down here.  Will r/o Pheo with 24 hour urine.  Check labs.  U/s for growth with MFM. Begin 2x/wk testing. NST reviewed and reactive.

## 2013-04-29 LAB — COMPREHENSIVE METABOLIC PANEL
ALT: 12 U/L (ref 0–35)
AST: 14 U/L (ref 0–37)
Albumin: 3.3 g/dL — ABNORMAL LOW (ref 3.5–5.2)
Alkaline Phosphatase: 64 U/L (ref 39–117)
BUN: 7 mg/dL (ref 6–23)
CO2: 23 mEq/L (ref 19–32)
Calcium: 9.2 mg/dL (ref 8.4–10.5)
Chloride: 103 mEq/L (ref 96–112)
Creat: 0.59 mg/dL (ref 0.50–1.10)
Glucose, Bld: 101 mg/dL — ABNORMAL HIGH (ref 70–99)
Potassium: 3.7 mEq/L (ref 3.5–5.3)
Sodium: 136 mEq/L (ref 135–145)
Total Bilirubin: 0.3 mg/dL (ref 0.3–1.2)
Total Protein: 5.8 g/dL — ABNORMAL LOW (ref 6.0–8.3)

## 2013-05-01 ENCOUNTER — Ambulatory Visit (INDEPENDENT_AMBULATORY_CARE_PROVIDER_SITE_OTHER): Payer: BC Managed Care – PPO | Admitting: *Deleted

## 2013-05-01 DIAGNOSIS — O10019 Pre-existing essential hypertension complicating pregnancy, unspecified trimester: Secondary | ICD-10-CM

## 2013-05-01 DIAGNOSIS — O10013 Pre-existing essential hypertension complicating pregnancy, third trimester: Secondary | ICD-10-CM

## 2013-05-01 NOTE — Progress Notes (Signed)
Pt here for NST only due to HTN

## 2013-05-04 ENCOUNTER — Ambulatory Visit (HOSPITAL_COMMUNITY)
Admission: RE | Admit: 2013-05-04 | Discharge: 2013-05-04 | Disposition: A | Discharge status: Home / Self Care | Payer: BC Managed Care – PPO | Source: Ambulatory Visit | Attending: Family Medicine | Admitting: Family Medicine

## 2013-05-04 DIAGNOSIS — E669 Obesity, unspecified: Secondary | ICD-10-CM | POA: Insufficient documentation

## 2013-05-04 DIAGNOSIS — O34219 Maternal care for unspecified type scar from previous cesarean delivery: Secondary | ICD-10-CM | POA: Insufficient documentation

## 2013-05-04 DIAGNOSIS — O09299 Supervision of pregnancy with other poor reproductive or obstetric history, unspecified trimester: Secondary | ICD-10-CM | POA: Insufficient documentation

## 2013-05-04 DIAGNOSIS — O10019 Pre-existing essential hypertension complicating pregnancy, unspecified trimester: Secondary | ICD-10-CM | POA: Insufficient documentation

## 2013-05-06 LAB — CATECHOLAMINES, FRACTIONATED, URINE, 24 HOUR
Calculated Total (E+NE): 76 mcg/24 h (ref 26–121)
Creatinine, Urine mg/day-CATEUR: 1.63 g/(24.h) (ref 0.63–2.50)
Dopamine, 24 hr Urine: 396 mcg/24 h (ref 52–480)
Epinephrine, 24 hr Urine: 18 mcg/24 h (ref 2–24)
Norepinephrine, 24 hr Ur: 58 mcg/24 h (ref 15–100)
Total Volume - CF 24Hr U: 1100 mL

## 2013-05-08 ENCOUNTER — Other Ambulatory Visit: Payer: Self-pay | Admitting: Family Medicine

## 2013-05-08 ENCOUNTER — Ambulatory Visit (HOSPITAL_COMMUNITY): Admission: RE | Admit: 2013-05-08 | Payer: BC Managed Care – PPO | Source: Ambulatory Visit

## 2013-05-08 ENCOUNTER — Ambulatory Visit (HOSPITAL_COMMUNITY)
Admission: RE | Admit: 2013-05-08 | Discharge: 2013-05-08 | Disposition: A | Discharge status: Home / Self Care | Payer: BC Managed Care – PPO | Source: Ambulatory Visit | Attending: Family Medicine | Admitting: Family Medicine

## 2013-05-08 VITALS — BP 141/79 | HR 87 | Wt 316.5 lb

## 2013-05-08 DIAGNOSIS — I10 Essential (primary) hypertension: Secondary | ICD-10-CM

## 2013-05-08 DIAGNOSIS — O34219 Maternal care for unspecified type scar from previous cesarean delivery: Secondary | ICD-10-CM | POA: Insufficient documentation

## 2013-05-08 DIAGNOSIS — O09299 Supervision of pregnancy with other poor reproductive or obstetric history, unspecified trimester: Secondary | ICD-10-CM | POA: Insufficient documentation

## 2013-05-08 DIAGNOSIS — O99210 Obesity complicating pregnancy, unspecified trimester: Secondary | ICD-10-CM | POA: Insufficient documentation

## 2013-05-08 DIAGNOSIS — O10019 Pre-existing essential hypertension complicating pregnancy, unspecified trimester: Secondary | ICD-10-CM | POA: Insufficient documentation

## 2013-05-08 DIAGNOSIS — E669 Obesity, unspecified: Secondary | ICD-10-CM | POA: Insufficient documentation

## 2013-05-10 ENCOUNTER — Other Ambulatory Visit: Payer: BC Managed Care – PPO

## 2013-05-10 ENCOUNTER — Encounter: Payer: Self-pay | Admitting: Obstetrics and Gynecology

## 2013-05-10 ENCOUNTER — Telehealth: Payer: Self-pay | Admitting: *Deleted

## 2013-05-10 ENCOUNTER — Ambulatory Visit (INDEPENDENT_AMBULATORY_CARE_PROVIDER_SITE_OTHER): Payer: BC Managed Care – PPO | Admitting: Obstetrics and Gynecology

## 2013-05-10 VITALS — BP 134/69

## 2013-05-10 DIAGNOSIS — O09293 Supervision of pregnancy with other poor reproductive or obstetric history, third trimester: Secondary | ICD-10-CM

## 2013-05-10 DIAGNOSIS — Z3483 Encounter for supervision of other normal pregnancy, third trimester: Secondary | ICD-10-CM

## 2013-05-10 DIAGNOSIS — O26713 Subluxation of symphysis (pubis) in pregnancy, third trimester: Secondary | ICD-10-CM

## 2013-05-10 DIAGNOSIS — I10 Essential (primary) hypertension: Secondary | ICD-10-CM

## 2013-05-10 DIAGNOSIS — Z3480 Encounter for supervision of other normal pregnancy, unspecified trimester: Secondary | ICD-10-CM

## 2013-05-10 DIAGNOSIS — O10013 Pre-existing essential hypertension complicating pregnancy, third trimester: Secondary | ICD-10-CM

## 2013-05-10 DIAGNOSIS — O09299 Supervision of pregnancy with other poor reproductive or obstetric history, unspecified trimester: Secondary | ICD-10-CM

## 2013-05-10 DIAGNOSIS — O9989 Other specified diseases and conditions complicating pregnancy, childbirth and the puerperium: Secondary | ICD-10-CM

## 2013-05-10 DIAGNOSIS — O10019 Pre-existing essential hypertension complicating pregnancy, unspecified trimester: Secondary | ICD-10-CM

## 2013-05-10 DIAGNOSIS — O34219 Maternal care for unspecified type scar from previous cesarean delivery: Secondary | ICD-10-CM

## 2013-05-10 DIAGNOSIS — E669 Obesity, unspecified: Secondary | ICD-10-CM

## 2013-05-10 DIAGNOSIS — N200 Calculus of kidney: Secondary | ICD-10-CM

## 2013-05-10 NOTE — Telephone Encounter (Signed)
Message copied by Tandy Gaw C on Wed May 10, 2013 10:35 AM ------      Message from: Reva Bores      Created: Tue May 09, 2013  8:40 AM       24 hour urine--does not show evidence of pheochromocytoma--may inform pt. ------

## 2013-05-10 NOTE — Telephone Encounter (Signed)
Pt aware of results 

## 2013-05-10 NOTE — Progress Notes (Signed)
Patient doing well, presented today secondary to concerns of decreased fetal movement. NST reviewed and reactive. 7/21 EFW 2468 gm (69%tile). MFM [place] on 7/28. FM/PTL precautions reviewed. Patient not taking labetalol at this time. F/U as scheduled with Dr. Shawnie Pons

## 2013-05-11 ENCOUNTER — Other Ambulatory Visit: Payer: Self-pay | Admitting: Obstetrics & Gynecology

## 2013-05-11 ENCOUNTER — Other Ambulatory Visit: Payer: BC Managed Care – PPO

## 2013-05-11 DIAGNOSIS — O10019 Pre-existing essential hypertension complicating pregnancy, unspecified trimester: Secondary | ICD-10-CM

## 2013-05-15 ENCOUNTER — Ambulatory Visit (HOSPITAL_COMMUNITY)
Admission: RE | Admit: 2013-05-15 | Discharge: 2013-05-15 | Disposition: A | Discharge status: Home / Self Care | Payer: BC Managed Care – PPO | Source: Ambulatory Visit | Attending: Family Medicine | Admitting: Family Medicine

## 2013-05-15 ENCOUNTER — Telehealth: Payer: Self-pay | Admitting: *Deleted

## 2013-05-15 ENCOUNTER — Ambulatory Visit (HOSPITAL_COMMUNITY)
Admission: RE | Admit: 2013-05-15 | Discharge: 2013-05-15 | Disposition: A | Discharge status: Home / Self Care | Payer: BC Managed Care – PPO | Source: Ambulatory Visit | Attending: Obstetrics & Gynecology | Admitting: Obstetrics & Gynecology

## 2013-05-15 DIAGNOSIS — O10019 Pre-existing essential hypertension complicating pregnancy, unspecified trimester: Secondary | ICD-10-CM | POA: Insufficient documentation

## 2013-05-15 DIAGNOSIS — O09299 Supervision of pregnancy with other poor reproductive or obstetric history, unspecified trimester: Secondary | ICD-10-CM | POA: Insufficient documentation

## 2013-05-15 DIAGNOSIS — E669 Obesity, unspecified: Secondary | ICD-10-CM | POA: Insufficient documentation

## 2013-05-15 DIAGNOSIS — O34219 Maternal care for unspecified type scar from previous cesarean delivery: Secondary | ICD-10-CM | POA: Insufficient documentation

## 2013-05-15 NOTE — Telephone Encounter (Signed)
She can start with bid dosing and if BP's are still up, can increase dosage, without increase to tid dosing, if she is concerned with that.

## 2013-05-15 NOTE — Telephone Encounter (Signed)
Notified Patient of recommendation and she will follow and come back to office Thursday for her NST.

## 2013-05-15 NOTE — Consult Note (Signed)
MFM Staff Consult Note:  By way of consultation, I spoke to Ms. [name]  for more than 15 minutes about the risks of hypertension in pregnancy.   We talked about the medical treatment of hypertension in pregnancy.  I outlined the different classes of medications, emphasizing that angiotensin enzyme inhibitors and angoitensin receptor blockers are contraindicated, and diuretics are relatively contraindicated.  I told her that beta-blockers and calcium channel blockers are commonly used to treat hypertension in pregnancy, and that both are felt to be safe for use in pregnancy.  Finally, I outlined the usual plan of management for hypertension in pregnancy.  She should have her blood pressure carefully followed, and her medications adjusted to keep her BP in the target range of around 130-140/80-90 mm/Hg. Given that her BP's are labile, we reviewed in detail her home BP measurements.  I adjusted her labetalol to 100mg  po TID (from 100mg  po BID) and explained the importance of taking her medication every 8 hours precisely to avoid the alternating highs and normal values that she explained.    Continue twice weekly non-stress tests with weekly amniotic fluid volume measurement should be initiated.  Finally, assuming all goes well, she should be delivered at around [redacted] weeks gestation, if spontaneous labor does not occur before.   Summary of Recommendations: 1. Adjust Labetalol as clinically indicated over the course of this pregnancy; gave labetalol 100mg  po TID. 2. F/u BP at home BID.  Preeclamptic precautions given.  BP check in office with NST twice weekly at a minimum. 3. Interval growth ultrasounds monthly; 4. Twice weekly NSTs and weekly AFIs; 5. Delivery at [redacted] weeks gestation assuming HTN is medically controlled, growth is AGA, testing is reassuring, and preeclampsia does not develop in the interim.  Time Spent: I spent in excess of 20 minutes in consultation with this patient to review  records, evaluate her case, and provide her with an adequate discussion and education.  More than 50% of this time (15 minutes) was spent in direct face-to-face counseling. It was a pleasure seeing your patient in the office today.  Thank you for consultation. Please do not hesitate to contact our service for any further questions.   Thank you,  [name], Jackie Sjogren, MD, MS, FACOG Assistant Professor Section of Maternal-Fetal Medicine [facility]

## 2013-05-15 NOTE — Telephone Encounter (Signed)
Over the weekend patients blood pressure was in the 160/104 range and so she started to take her medicine.  At mfm today it was 155/98.  That Doc advised her to take it tid, every 8 hours and this would mean she would have to get up in the middle of the night to take it.  When they rechecked her BP at mfm it was 117/83 and the last one was 106/71 all of these pressures were within 20 minutes.  She would just like some guidance as she feels symptomatic with dizziness and fatigue nausea, shortness of breath when her pressures are high.  She did just start taking the medicine Saturday morning with a bp of 140/90.  Prior to this she had not been taking it because her blood pressures were all very good.

## 2013-05-17 ENCOUNTER — Encounter: Payer: BC Managed Care – PPO | Admitting: Family Medicine

## 2013-05-18 ENCOUNTER — Ambulatory Visit (INDEPENDENT_AMBULATORY_CARE_PROVIDER_SITE_OTHER): Payer: BC Managed Care – PPO | Admitting: Obstetrics and Gynecology

## 2013-05-18 ENCOUNTER — Inpatient Hospital Stay (HOSPITAL_COMMUNITY)
Admission: AD | Admit: 2013-05-18 | Discharge: 2013-05-18 | Disposition: A | Discharge status: Home / Self Care | Payer: BC Managed Care – PPO | Source: Ambulatory Visit | Attending: Obstetrics & Gynecology | Admitting: Obstetrics & Gynecology

## 2013-05-18 ENCOUNTER — Encounter (HOSPITAL_COMMUNITY): Payer: Self-pay | Admitting: *Deleted

## 2013-05-18 ENCOUNTER — Encounter: Payer: Self-pay | Admitting: Obstetrics and Gynecology

## 2013-05-18 VITALS — BP 155/97 | Wt 318.0 lb

## 2013-05-18 DIAGNOSIS — I10 Essential (primary) hypertension: Secondary | ICD-10-CM

## 2013-05-18 DIAGNOSIS — O26713 Subluxation of symphysis (pubis) in pregnancy, third trimester: Secondary | ICD-10-CM

## 2013-05-18 DIAGNOSIS — O10019 Pre-existing essential hypertension complicating pregnancy, unspecified trimester: Secondary | ICD-10-CM | POA: Insufficient documentation

## 2013-05-18 DIAGNOSIS — O10013 Pre-existing essential hypertension complicating pregnancy, third trimester: Secondary | ICD-10-CM

## 2013-05-18 DIAGNOSIS — O09299 Supervision of pregnancy with other poor reproductive or obstetric history, unspecified trimester: Secondary | ICD-10-CM

## 2013-05-18 DIAGNOSIS — Z3480 Encounter for supervision of other normal pregnancy, unspecified trimester: Secondary | ICD-10-CM

## 2013-05-18 DIAGNOSIS — Z3483 Encounter for supervision of other normal pregnancy, third trimester: Secondary | ICD-10-CM

## 2013-05-18 DIAGNOSIS — O34219 Maternal care for unspecified type scar from previous cesarean delivery: Secondary | ICD-10-CM

## 2013-05-18 DIAGNOSIS — O9989 Other specified diseases and conditions complicating pregnancy, childbirth and the puerperium: Secondary | ICD-10-CM

## 2013-05-18 DIAGNOSIS — R7309 Other abnormal glucose: Secondary | ICD-10-CM | POA: Insufficient documentation

## 2013-05-18 DIAGNOSIS — O09293 Supervision of pregnancy with other poor reproductive or obstetric history, third trimester: Secondary | ICD-10-CM

## 2013-05-18 DIAGNOSIS — E669 Obesity, unspecified: Secondary | ICD-10-CM

## 2013-05-18 LAB — CBC WITH DIFFERENTIAL/PLATELET
Basophils Absolute: 0 10*3/uL (ref 0.0–0.1)
Basophils Relative: 0 % (ref 0–1)
Eosinophils Absolute: 0 10*3/uL (ref 0.0–0.7)
Eosinophils Relative: 0 % (ref 0–5)
HCT: 34.9 % — ABNORMAL LOW (ref 36.0–46.0)
Hemoglobin: 11.8 g/dL — ABNORMAL LOW (ref 12.0–15.0)
Lymphocytes Relative: 16 % (ref 12–46)
Lymphs Abs: 1.4 10*3/uL (ref 0.7–4.0)
MCH: 29.4 pg (ref 26.0–34.0)
MCHC: 33.8 g/dL (ref 30.0–36.0)
MCV: 87 fL (ref 78.0–100.0)
Monocytes Absolute: 0.3 10*3/uL (ref 0.1–1.0)
Monocytes Relative: 4 % (ref 3–12)
Neutro Abs: 6.9 10*3/uL (ref 1.7–7.7)
Neutrophils Relative %: 80 % — ABNORMAL HIGH (ref 43–77)
Platelets: 176 10*3/uL (ref 150–400)
RBC: 4.01 MIL/uL (ref 3.87–5.11)
RDW: 13.4 % (ref 11.5–15.5)
WBC: 8.6 10*3/uL (ref 4.0–10.5)

## 2013-05-18 LAB — COMPREHENSIVE METABOLIC PANEL
ALT: 12 U/L (ref 0–35)
AST: 14 U/L (ref 0–37)
Albumin: 2.7 g/dL — ABNORMAL LOW (ref 3.5–5.2)
Alkaline Phosphatase: 81 U/L (ref 39–117)
BUN: 6 mg/dL (ref 6–23)
CO2: 24 mEq/L (ref 19–32)
Calcium: 9.6 mg/dL (ref 8.4–10.5)
Chloride: 103 mEq/L (ref 96–112)
Creatinine, Ser: 0.54 mg/dL (ref 0.50–1.10)
GFR calc Af Amer: >90 mL/min (ref >90)
GFR calc non Af Amer: >90 mL/min (ref >90)
Glucose, Bld: 151 mg/dL — ABNORMAL HIGH (ref 70–99)
Potassium: 3.5 mEq/L (ref 3.5–5.1)
Sodium: 136 mEq/L (ref 135–145)
Total Bilirubin: 0.3 mg/dL (ref 0.3–1.2)
Total Protein: 6 g/dL (ref 6.0–8.3)

## 2013-05-18 LAB — PROTEIN / CREATININE RATIO, URINE
Creatinine, Urine: 169.3 mg/dL
Protein Creatinine Ratio: 0.09 (ref 0.00–0.15)
Total Protein, Urine: 14.4 mg/dL

## 2013-05-18 MED ORDER — ACETAMINOPHEN 325 MG PO TABS
650.0000 mg | ORAL_TABLET | Freq: Once | ORAL | Status: AC
  Administered 2013-05-18: 650 mg via ORAL
  Filled 2013-05-18: qty 2

## 2013-05-18 NOTE — Progress Notes (Signed)
Patient feels like face is swollen, headache, eyes feel funny, right upper quadrant pain, and generally does not feel good... Urine is very dark in color and strong odor.

## 2013-05-18 NOTE — MAU Provider Note (Signed)
Attestation of Attending Supervision of Advanced Practitioner (PA/CNM/NP): Evaluation and management procedures were performed by the Advanced Practitioner under my supervision and collaboration.  I have reviewed the Advanced Practitioner's note and chart, and I agree with the management and plan.  Kyera Felan, MD, FACOG Attending Obstetrician & Gynecologist Faculty Practice, [facility]  

## 2013-05-18 NOTE — MAU Provider Note (Signed)
[name] 32 yo  [id number] at [redacted]w[redacted]d  Addendum to Dr. Charletta Cousin note for this visit  Reviewed EFM, serial BPs and lab results. Discussed symptoms and concerns (H/A, labile home BP readings).   Filed Vitals:   05/18/13 1345 05/18/13 1346 05/18/13 1402 05/18/13 1417  BP: 142/77 154/96 132/70 145/76  Pulse: 103 107 100 99  Temp:      TempSrc:      Resp: 16     Height:      Weight:      SpO2:       Results for orders placed during the hospital encounter of 05/18/13 (from the past 24 hour(s))  PROTEIN / CREATININE RATIO, URINE     Status: None   Collection Time    05/18/13  1:20 PM      Result Value Range   Creatinine, Urine 169.30     Total Protein, Urine 14.4     PROTEIN CREATININE RATIO 0.09  0.00 - 0.15  CBC WITH DIFFERENTIAL     Status: Abnormal   Collection Time    05/18/13  1:45 PM      Result Value Range   WBC 8.6  4.0 - 10.5 K/uL   RBC 4.01  3.87 - 5.11 MIL/uL   Hemoglobin 11.8 (*) 12.0 - 15.0 g/dL   HCT 45.4 (*) 09.8 - 11.9 %   MCV 87.0  78.0 - 100.0 fL   MCH 29.4  26.0 - 34.0 pg   MCHC 33.8  30.0 - 36.0 g/dL   RDW 14.7  82.9 - 56.2 %   Platelets 176  150 - 400 K/uL   Neutrophils Relative % 80 (*) 43 - 77 %   Neutro Abs 6.9  1.7 - 7.7 K/uL   Lymphocytes Relative 16  12 - 46 %   Lymphs Abs 1.4  0.7 - 4.0 K/uL   Monocytes Relative 4  3 - 12 %   Monocytes Absolute 0.3  0.1 - 1.0 K/uL   Eosinophils Relative 0  0 - 5 %   Eosinophils Absolute 0.0  0.0 - 0.7 K/uL   Basophils Relative 0  0 - 1 %   Basophils Absolute 0.0  0.0 - 0.1 K/uL  COMPREHENSIVE METABOLIC PANEL     Status: Abnormal   Collection Time    05/18/13  1:45 PM      Result Value Range   Sodium 136  135 - 145 mEq/L   Potassium 3.5  3.5 - 5.1 mEq/L   Chloride 103  96 - 112 mEq/L   CO2 24  19 - 32 mEq/L   Glucose, Bld 151 (*) 70 - 99 mg/dL   BUN 6  6 - 23 mg/dL   Creatinine, Ser 1.30  0.50 - 1.10 mg/dL   Calcium 9.6  8.4 - 86.5 mg/dL   Total Protein 6.0  6.0 - 8.3 g/dL   Albumin 2.7 (*) 3.5 - 5.2  g/dL   AST 14  0 - 37 U/L   ALT 12  0 - 35 U/L   Alkaline Phosphatase 81  39 - 117 U/L   Total Bilirubin 0.3  0.3 - 1.2 mg/dL   GFR calc non Af Amer >90  >90 mL/min   GFR calc Af Amer >90  >90 mL/min  ASSESSMENT: 1. Benign essential hypertension antepartum, third trimester   2. 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 Previous cesarean delivery, antepartum condition or complication   4. Supervision of other  normal pregnancy, third trimester   5. Elevated glucose     PLAN: Home with preE precautions, rest. May take up to acetaminophen 4 Gm/d for headaches.  Bring 24 hr urine to next PNV at [facility] home BP records and check some CBGs at home, bring to visit also.    Medication List         diazepam 5 MG tablet  Commonly known as:  VALIUM  Take 5 mg by mouth every 6 (six) hours as needed for anxiety.     Hydrocodone-Acetaminophen 5-300 MG Tabs  Commonly known as:  VICODIN  Take 1 tablet by mouth every 6 (six) hours as needed.     labetalol 100 MG tablet  Commonly known as:  NORMODYNE  Take 1 tablet (100 mg total) by mouth 2 (two) times daily.     omeprazole 20 MG tablet  Commonly known as:  PRILOSEC OTC  Take 20 mg by mouth daily as needed (heartburn).     PRENATAL VITAMINS PLUS 27-1 MG Tabs  Take 1 tablet by mouth daily.       Follow-up Information   Follow up with [facility] In 4 days. (Bring 24 hr urine specimen and home BP record and blood sugar record to visit on Mon.)    Contact information:   [address] [place] [address] [phone]    Danae Orleans, CNM 05/18/2013 5:37 PM

## 2013-05-18 NOTE — MAU Note (Signed)
Patient was seen at [facility] for an NST and her blood pressure was elevated. Sent to MAU for further evaluation. Is taking Labetalol. Has periods of shortness of breath and racing heart rate anytime she is moving around and her blood pressure goes up. Started having headaches yesterday. Has some mild contractions, no leaking or bleeding and reports good fetal movement.

## 2013-05-18 NOTE — Progress Notes (Signed)
Patient reports headaches, visual disturbances, RUQ discomfort since Saturday. She also feels that her face is swollen but no one around her agrees to that. Patient also describes not feeling well in general since Saturday, almost as if she is going to catch the flu. Will send to MAU to rule out preeclampsia. Case discussed with on call team at [facility] hospital. Patient to complete NST while at [facility] hospital

## 2013-05-22 ENCOUNTER — Encounter (HOSPITAL_COMMUNITY): Payer: Self-pay

## 2013-05-22 ENCOUNTER — Ambulatory Visit (HOSPITAL_COMMUNITY)
Admission: RE | Admit: 2013-05-22 | Discharge: 2013-05-22 | Disposition: A | Discharge status: Home / Self Care | Payer: BC Managed Care – PPO | Source: Ambulatory Visit | Attending: Obstetrics & Gynecology | Admitting: Obstetrics & Gynecology

## 2013-05-22 ENCOUNTER — Other Ambulatory Visit: Payer: Self-pay | Admitting: Obstetrics & Gynecology

## 2013-05-22 DIAGNOSIS — I10 Essential (primary) hypertension: Secondary | ICD-10-CM

## 2013-05-22 DIAGNOSIS — O09299 Supervision of pregnancy with other poor reproductive or obstetric history, unspecified trimester: Secondary | ICD-10-CM | POA: Insufficient documentation

## 2013-05-22 DIAGNOSIS — O34219 Maternal care for unspecified type scar from previous cesarean delivery: Secondary | ICD-10-CM | POA: Insufficient documentation

## 2013-05-22 DIAGNOSIS — O10019 Pre-existing essential hypertension complicating pregnancy, unspecified trimester: Secondary | ICD-10-CM | POA: Insufficient documentation

## 2013-05-22 DIAGNOSIS — O99210 Obesity complicating pregnancy, unspecified trimester: Secondary | ICD-10-CM | POA: Insufficient documentation

## 2013-05-22 DIAGNOSIS — E669 Obesity, unspecified: Secondary | ICD-10-CM | POA: Insufficient documentation

## 2013-05-22 NOTE — Progress Notes (Signed)
[name]  was seen today for an ultrasound appointment.  See full report in [facility].  Impression: Single IUP at 35 6/7 weeks Limited ultrasound for amniotic fluid assessmnt (AFI = 12.6 cm) Reactive NST Normal modified biophysical profile  Recommendations: Continue 2x weekly NSTs with weekly AFIs  Alpha Gula, MD

## 2013-05-23 ENCOUNTER — Ambulatory Visit (INDEPENDENT_AMBULATORY_CARE_PROVIDER_SITE_OTHER): Payer: BC Managed Care – PPO | Admitting: Family Medicine

## 2013-05-23 VITALS — BP 111/86 | Wt 320.0 lb

## 2013-05-23 DIAGNOSIS — Z3480 Encounter for supervision of other normal pregnancy, unspecified trimester: Secondary | ICD-10-CM

## 2013-05-23 DIAGNOSIS — O10013 Pre-existing essential hypertension complicating pregnancy, third trimester: Secondary | ICD-10-CM

## 2013-05-23 DIAGNOSIS — O10019 Pre-existing essential hypertension complicating pregnancy, unspecified trimester: Secondary | ICD-10-CM

## 2013-05-23 LAB — OB RESULTS CONSOLE GC/CHLAMYDIA
Chlamydia: NEGATIVE
Gonorrhea: NEGATIVE

## 2013-05-23 LAB — OB RESULTS CONSOLE GBS: GBS: NEGATIVE

## 2013-05-23 NOTE — Patient Instructions (Signed)
Preeclampsia and Eclampsia Preeclampsia is a condition of high blood pressure during pregnancy. It can happen at 20 weeks or later in pregnancy. If high blood pressure occurs in the second half of pregnancy with no other symptoms, it is called gestational hypertension and goes away after the baby is born. If any of the symptoms listed below develop with gestational hypertension, it is then called preeclampsia. Eclampsia (convulsions) may follow preeclampsia. This is one of the reasons for regular prenatal checkups. Early diagnosis and treatment are very important to prevent eclampsia. CAUSES  There is no known cause of preeclampsia/eclampsia in pregnancy. There are several known conditions that may put the pregnant woman at risk, such as:  The first pregnancy.  Having preeclampsia in a past pregnancy.  Having lasting (chronic) high blood pressure.  Having multiples (twins, triplets).  Being age 35 or older.  African American ethnic background.  Having kidney disease or diabetes.  Medical conditions such as lupus or blood diseases.  Being overweight (obese). SYMPTOMS   High blood pressure.  Headaches.  Sudden weight gain.  Swelling of hands, face, legs, and feet.  Protein in the urine.  Feeling sick to your stomach (nauseous) and throwing up (vomiting).  Vision problems (blurred or double vision).  Numbness in the face, arms, legs, and feet.  Dizziness.  Slurred speech.  Preeclampsia can cause growth retardation in the fetus.  Separation (abruption) of the placenta.  Not enough fluid in the amniotic sac (oligohydramnios).  Sensitivity to bright lights.  Belly (abdominal) pain. DIAGNOSIS  If protein is found in the urine in the second half of pregnancy, this is considered preeclampsia. Other symptoms mentioned above may also be present. TREATMENT  It is necessary to treat this.  Your caregiver may prescribe bed rest early in this condition. Plenty of rest and  salt restriction may be all that is needed.  Medicines may be necessary to lower blood pressure if the condition does not respond to more conservative measures.  In more severe cases, hospitalization may be needed:  For treatment of blood pressure.  To control fluid retention.  To monitor the baby to see if the condition is causing harm to the baby.  Hospitalization is the [place] way to treat the first sign of preeclampsia. This is so the mother and baby can be watched closely and blood tests can be done effectively and correctly.  If the condition becomes severe, it may be necessary to induce labor or to remove the infant by surgical means (cesarean section). The [place] cure for preeclampsia/eclampsia is to deliver the baby. Preeclampsia and eclampsia involve risks to mother and infant. Your caregiver will discuss these risks with you. Together, you can work out the [place] possible approach to your problems. Make sure you keep your prenatal visits as scheduled. Not keeping appointments could result in a chronic or permanent injury, pain, disability to you, and death or injury to you or your unborn baby. If there is any problem keeping the appointment, you must call to reschedule. HOME CARE INSTRUCTIONS   Keep your prenatal appointments and tests as scheduled.  Tell your caregiver if you have any of the above risk factors.  Get plenty of rest and sleep.  Eat a balanced diet that is low in salt, and do not add salt to your food.  Avoid stressful situations.  Only take over-the-counter and prescriptions medicines for pain, discomfort, or fever as directed by your caregiver. SEEK IMMEDIATE MEDICAL CARE IF:   You develop severe swelling   anywhere in the body. This usually occurs in the legs.  You gain 5 lb/2.3 kg or more in a week.  You develop a severe headache, dizziness, problems with your vision, or confusion.  You have abdominal pain, nausea, or vomiting.  You have a seizure.  You  have trouble moving any part of your body, or you develop numbness or problems speaking.  You have bruising or abnormal bleeding from anywhere in the body.  You develop a stiff neck.  You pass out. MAKE SURE YOU:   Understand these instructions.  Will watch your condition.  Will get help right away if you are not doing well or get worse. Document Released: 10/02/2000 Document Revised: 12/28/2011 Document Reviewed: 05/18/2008 [employer] Patient Information 2014 [employer].  Pregnancy - Third Trimester The third trimester of pregnancy (the last 3 months) is a period of the most rapid growth for you and your baby. The baby approaches a length of 20 inches and a weight of 6 to 10 pounds. The baby is adding on fat and getting ready for life outside your body. While inside, babies have periods of sleeping and waking, sucking thumbs, and hiccuping. You can often feel small contractions of the uterus. This is false labor. It is also called Braxton-Hicks contractions. This is like a practice for labor. The usual problems in this stage of pregnancy include more difficulty breathing, swelling of the hands and feet from water retention, and having to urinate more often because of the uterus and baby pressing on your bladder.  PRENATAL EXAMS  Blood work may continue to be done during prenatal exams. These tests are done to check on your health and the probable health of your baby. Blood work is used to follow your blood levels (hemoglobin). Anemia (low hemoglobin) is common during pregnancy. Iron and vitamins are given to help prevent this. You may also continue to be checked for diabetes. Some of the past blood tests may be done again.  The size of the uterus is measured during each visit. This makes sure your baby is growing properly according to your pregnancy dates.  Your blood pressure is checked every prenatal visit. This is to make sure you are not getting toxemia.  Your urine is checked  every prenatal visit for infection, diabetes, and protein.  Your weight is checked at each visit. This is done to make sure gains are happening at the suggested rate and that you and your baby are growing normally.  Sometimes, an ultrasound is performed to confirm the position and the proper growth and development of the baby. This is a test done that bounces harmless sound waves off the baby so your caregiver can more accurately determine a due date.  Discuss the type of pain medicine and anesthesia you will have during your labor and delivery.  Discuss the possibility and anesthesia if a cesarean section might be necessary.  Inform your caregiver if there is any mental or physical violence at home. Sometimes, a specialized non-stress test, contraction stress test, and biophysical profile are done to make sure the baby is not having a problem. Checking the amniotic fluid surrounding the baby is called an amniocentesis. The amniotic fluid is removed by sticking a needle into the belly (abdomen). This is sometimes done near the end of pregnancy if an early delivery is required. In this case, it is done to help make sure the baby's lungs are mature enough for the baby to live outside of the womb. If the   lungs are not mature and it is unsafe to deliver the baby, an injection of cortisone medicine is given to the mother 1 to 2 days before the delivery. This helps the baby's lungs mature and makes it safer to deliver the baby. CHANGES OCCURING IN THE THIRD TRIMESTER OF PREGNANCY Your body goes through many changes during pregnancy. They vary from person to person. Talk to your caregiver about changes you notice and are concerned about.  During the last trimester, you have probably had an increase in your appetite. It is normal to have cravings for certain foods. This varies from person to person and pregnancy to pregnancy.  You may begin to get stretch marks on your hips, abdomen, and breasts. These are  normal changes in the body during pregnancy. There are no exercises or medicines to take which prevent this change.  Constipation may be treated with a stool softener or adding bulk to your diet. Drinking lots of fluids, fiber in vegetables, fruits, and whole grains are helpful.  Exercising is also helpful. If you have been very active up until your pregnancy, most of these activities can be continued during your pregnancy. If you have been less active, it is helpful to start an exercise program such as walking. Consult your caregiver before starting exercise programs.  Avoid all smoking, alcohol, non-prescribed drugs, herbs and "street drugs" during your pregnancy. These chemicals affect the formation and growth of the baby. Avoid chemicals throughout the pregnancy to ensure the delivery of a healthy infant.  Backache, varicose veins, and hemorrhoids may develop or get worse.  You will tire more easily in the third trimester, which is normal.  The baby's movements may be stronger and more often.  You may become short of breath easily.  Your belly button may stick out.  A yellow discharge may leak from your breasts called colostrum.  You may have a bloody mucus discharge. This usually occurs a few days to a week before labor begins. HOME CARE INSTRUCTIONS   Keep your caregiver's appointments. Follow your caregiver's instructions regarding medicine use, exercise, and diet.  During pregnancy, you are providing food for you and your baby. Continue to eat regular, well-balanced meals. Choose foods such as meat, fish, milk and other low fat dairy products, vegetables, fruits, and whole-grain breads and cereals. Your caregiver will tell you of the ideal weight gain.  A physical sexual relationship may be continued throughout pregnancy if there are no other problems such as early (premature) leaking of amniotic fluid from the membranes, vaginal bleeding, or belly (abdominal) pain.  Exercise  regularly if there are no restrictions. Check with your caregiver if you are unsure of the safety of your exercises. Greater weight gain will occur in the last 2 trimesters of pregnancy. Exercising helps:  Control your weight.  Get you in shape for labor and delivery.  You lose weight after you deliver.  Rest a lot with legs elevated, or as needed for leg cramps or low back pain.  Wear a good support or jogging bra for breast tenderness during pregnancy. This may help if worn during sleep. Pads or tissues may be used in the bra if you are leaking colostrum.  Do not use hot tubs, steam rooms, or saunas.  Wear your seat belt when driving. This protects you and your baby if you are in an accident.  Avoid raw meat, cat litter boxes and soil used by cats. These carry germs that can cause birth defects in the baby.    It is easier to leak urine during pregnancy. Tightening up and strengthening the pelvic muscles will help with this problem. You can practice stopping your urination while you are going to the bathroom. These are the same muscles you need to strengthen. It is also the muscles you would use if you were trying to stop from passing gas. You can practice tightening these muscles up 10 times a set and repeating this about 3 times per day. Once you know what muscles to tighten up, do not perform these exercises during urination. It is more likely to cause an infection by backing up the urine.  Ask for help if you have financial, counseling, or nutritional needs during pregnancy. Your caregiver will be able to offer counseling for these needs as well as refer you for other special needs.  Make a list of emergency phone numbers and have them available.  Plan on getting help from family or friends when you go home from the hospital.  Make a trial run to the hospital.  Take prenatal classes with the father to understand, practice, and ask questions about the labor and delivery.  Prepare the  baby's room or nursery.  Do not travel out of the city unless it is absolutely necessary and with the advice of your caregiver.  Wear only low or no heal shoes to have better balance and prevent falling. MEDICINES AND DRUG USE IN PREGNANCY  Take prenatal vitamins as directed. The vitamin should contain 1 milligram of folic acid. Keep all vitamins out of reach of children. Only a couple vitamins or tablets containing iron may be fatal to a baby or young child when ingested.  Avoid use of all medicines, including herbs, over-the-counter medicines, not prescribed or suggested by your caregiver. Only take over-the-counter or prescription medicines for pain, discomfort, or fever as directed by your caregiver. Do not use aspirin, ibuprofen or naproxen unless approved by your caregiver.  Let your caregiver also know about herbs you may be using.  Alcohol is related to a number of birth defects. This includes fetal alcohol syndrome. All alcohol, in any form, should be avoided completely. Smoking will cause low birth rate and premature babies.  Illegal drugs are very harmful to the baby. They are absolutely forbidden. A baby born to an addicted mother will be addicted at birth. The baby will go through the same withdrawal an adult does. SEEK MEDICAL CARE IF: You have any concerns or worries during your pregnancy. It is better to call with your questions if you feel they cannot wait, rather than worry about them. SEEK IMMEDIATE MEDICAL CARE IF:   An unexplained oral temperature above 102 F (38.9 C) develops, or as your caregiver suggests.  You have leaking of fluid from the vagina. If leaking membranes are suspected, take your temperature and tell your caregiver of this when you call.  There is vaginal spotting, bleeding or passing clots. Tell your caregiver of the amount and how many pads are used.  You develop a bad smelling vaginal discharge with a change in the color from clear to white.  You  develop vomiting that lasts more than 24 hours.  You develop chills or fever.  You develop shortness of breath.  You develop burning on urination.  You loose more than 2 pounds of weight or gain more than 2 pounds of weight or as suggested by your caregiver.  You notice sudden swelling of your face, hands, and feet or legs.  You develop belly (abdominal)   pain. Round ligament discomfort is a common non-cancerous (benign) cause of abdominal pain in pregnancy. Your caregiver still must evaluate you.  You develop a severe headache that does not go away.  You develop visual problems, blurred or double vision.  If you have not felt your baby move for more than 1 hour. If you think the baby is not moving as much as usual, eat something with sugar in it and lie down on your left side for an hour. The baby should move at least 4 to 5 times per hour. Call right away if your baby moves less than that.  You fall, are in a car accident, or any kind of trauma.  There is mental or physical violence at home. Document Released: 09/29/2001 Document Revised: 06/29/2012 Document Reviewed: 04/03/2009 [employer] Patient Information 2014 [employer].  Breastfeeding A change in hormones during your pregnancy causes growth of your breast tissue and an increase in number and size of milk ducts. The hormone prolactin allows proteins, sugars, and fats from your blood supply to make breast milk in your milk-producing glands. The hormone progesterone prevents breast milk from being released before the birth of your baby. After the birth of your baby, your progesterone level decreases allowing breast milk to be released. Thoughts of your baby, as well as his or her sucking or crying, can stimulate the release of milk from the milk-producing glands. Deciding to breastfeed (nurse) is one of the [place] choices you can make for you and your baby. The information that follows gives a brief review of the benefits, as well as  other important skills to know about breastfeeding. BENEFITS OF BREASTFEEDING For your baby  The first milk (colostrum) helps your baby's digestive system function better.   There are antibodies in your milk that help your baby fight off infections.   Your baby has a lower incidence of asthma, allergies, and sudden infant death syndrome (SIDS).   The nutrients in breast milk are better for your baby than infant formulas.  Breast milk improves your baby's brain development.   Your baby will have less gas, colic, and constipation.  Your baby is less likely to develop other conditions, such as childhood obesity, asthma, or diabetes mellitus. For you  Breastfeeding helps develop a very special bond between you and your baby.   Breastfeeding is convenient, always available at the correct temperature, and costs nothing.   Breastfeeding helps to burn calories and helps you lose the weight gained during pregnancy.   Breastfeeding makes your uterus contract back down to normal size faster and slows bleeding following delivery.   Breastfeeding mothers have a lower risk of developing osteoporosis or breast or ovarian cancer later in life.  BREASTFEEDING FREQUENCY  A healthy, full-term baby may breastfeed as often as every hour or space his or her feedings to every 3 hours. Breastfeeding frequency will vary from baby to baby.   Newborns should be fed no less than every 2 3 hours during the day and every 4 5 hours during the night. You should breastfeed a minimum of 8 feedings in a 24 hour period.  Awaken your baby to breastfeed if it has been 3 4 hours since the last feeding.  Breastfeed when you feel the need to reduce the fullness of your breasts or when your newborn shows signs of hunger. Signs that your baby may be hungry include:  Increased alertness or activity.  Stretching.  Movement of the head from side to side.  Movement   of the head and opening of the mouth when  the corner of the mouth or cheek is stroked (rooting).  Increased sucking sounds, smacking lips, cooing, sighing, or squeaking.  Hand-to-mouth movements.  Increased sucking of fingers or hands.  Fussing.  Intermittent crying.  Signs of extreme hunger will require calming and consoling before you try to feed your baby. Signs of extreme hunger may include:  Restlessness.  A loud, strong cry.  Screaming.  Frequent feeding will help you make more milk and will help prevent problems, such as sore nipples and engorgement of the breasts.  BREASTFEEDING   Whether lying down or sitting, be sure that the baby's abdomen is facing your abdomen.   Support your breast with 4 fingers under your breast and your thumb above your nipple. Make sure your fingers are well away from your nipple and your baby's mouth.   Stroke your baby's lips gently with your finger or nipple.   When your baby's mouth is open wide enough, place all of your nipple and as much of the colored area around your nipple (areola) as possible into your baby's mouth.  More areola should be visible above his or her upper lip than below his or her lower lip.  Your baby's tongue should be between his or her lower gum and your breast.  Ensure that your baby's mouth is correctly positioned around the nipple (latched). Your baby's lips should create a seal on your breast.  Signs that your baby has effectively latched onto your nipple include:  Tugging or sucking without pain.  Swallowing heard between sucks.  Absent click or smacking sound.  Muscle movement above and in front of his or her ears with sucking.  Your baby must suck about 2 3 minutes in order to get your milk. Allow your baby to feed on each breast as long as he or she wants. Nurse your baby until he or she unlatches or falls asleep at the first breast, then offer the second breast.  Signs that your baby is full and satisfied include:  A gradual  decrease in the number of sucks or complete cessation of sucking.  Falling asleep.  Extension or relaxation of his or her body.  Retention of a small amount of milk in his or her mouth.  Letting go of your breast by himself or herself.  Signs of effective breastfeeding in you include:  Breasts that have increased firmness, weight, and size prior to feeding.  Breasts that are softer after nursing.  Increased milk volume, as well as a change in milk consistency and color by the 5th day of breastfeeding.  Breast fullness relieved by breastfeeding.  Nipples are not sore, cracked, or bleeding.  If needed, break the suction by putting your finger into the corner of your baby's mouth and sliding your finger between his or her gums. Then, remove your breast from his or her mouth.  It is common for babies to spit up a small amount after a feeding.  Babies often swallow air during feeding. This can make babies fussy. Burping your baby between breasts can help with this.  Vitamin D supplements are recommended for babies who get only breast milk.  Avoid using a pacifier during your baby's first 4 6 weeks.  Avoid supplemental feedings of water, formula, or juice in place of breastfeeding. Breast milk is all the food your baby needs. It is not necessary for your baby to have water or formula. Your breasts will make more   milk if supplemental feedings are avoided during the early weeks. HOW TO TELL WHETHER YOUR BABY IS GETTING ENOUGH BREAST MILK Wondering whether or not your baby is getting enough milk is a common concern among mothers. You can be assured that your baby is getting enough milk if:   Your baby is actively sucking and you hear swallowing.   Your baby seems relaxed and satisfied after a feeding.   Your baby nurses at least 8 12 times in a 24 hour time period.  During the first 3 5 days of age:  Your baby is wetting at least 3 5 diapers in a 24 hour period. The urine should  be clear and pale yellow.  Your baby is having at least 3 4 stools in a 24 hour period. The stool should be soft and yellow.  At 5 7 days of age, your baby is having at least 3 6 stools in a 24 hour period. The stool should be seedy and yellow by 5 days of age.  Your baby has a weight loss less than 7 10% during the first 3 days of age.  Your baby does not lose weight after 3 7 days of age.  Your baby gains 4 7 ounces each week after he or she is 4 days of age.  Your baby gains weight by 5 days of age and is back to birth weight within 2 weeks. ENGORGEMENT In the first week after your baby is born, you may experience extremely full breasts (engorgement). When engorged, your breasts may feel heavy, warm, or tender to the touch. Engorgement peaks within 24 48 hours after delivery of your baby.  Engorgement may be reduced by:  Continuing to breastfeed.  Increasing the frequency of breastfeeding.  Taking warm showers or applying warm, moist heat to your breasts just before each feeding. This increases circulation and helps the milk flow.   Gently massaging your breast before and during the feedings. With your fingertips, massage from your chest wall towards your nipple in a circular motion.   Ensuring that your baby empties at least one breast at every feeding. It also helps to start the next feeding on the opposite breast.   Expressing breast milk by hand or by using a breast pump to empty the breasts if your baby is sleepy, or not nursing well. You may also want to express milk if you are returning to work oryou feel you are getting engorged.  Ensuring your baby is latched on and positioned properly while breastfeeding. If you follow these suggestions, your engorgement should improve in 24 48 hours. If you are still experiencing difficulty, call your lactation consultant or caregiver.  CARING FOR YOURSELF Take care of your breasts.  Bathe or shower daily.   Avoid using soap on  your nipples.   Wear a supportive bra. Avoid wearing underwire style bras.  Air dry your nipples for a 3 4minutes after each feeding.   Use only cotton bra pads to absorb breast milk leakage. Leaking of breast milk between feedings is normal.   Use only pure lanolin on your nipples after nursing. You do not need to wash it off before feeding your baby again. Another option is to express a few drops of breast milk and gently massage that milk into your nipples.  Continue breast self-awareness checks. Take care of yourself.  Eat healthy foods. Alternate 3 meals with 3 snacks.  Avoid foods that you notice affect your baby in a bad way.    Drink milk, fruit juice, and water to satisfy your thirst (about 8 glasses a day).   Rest often, relax, and take your prenatal vitamins to prevent fatigue, stress, and anemia.  Avoid chewing and smoking tobacco.  Avoid alcohol and drug use.  Take over-the-counter and prescribed medicine only as directed by your caregiver or pharmacist. You should always check with your caregiver or pharmacist before taking any new medicine, vitamin, or herbal supplement.  Know that pregnancy is possible while breastfeeding. If desired, talk to your caregiver about family planning and safe birth control methods that may be used while breastfeeding. SEEK MEDICAL CARE IF:   You feel like you want to stop breastfeeding or have become frustrated with breastfeeding.  You have painful breasts or nipples.  Your nipples are cracked or bleeding.  Your breasts are red, tender, or warm.  You have a swollen area on either breast.  You have a fever or chills.  You have nausea or vomiting.  You have drainage from your nipples.  Your breasts do not become full before feedings by the 5th day after delivery.  You feel sad and depressed.  Your baby is too sleepy to eat well.  Your baby is having trouble sleeping.   Your baby is wetting less than 3 diapers in a 24  hour period.  Your baby has less than 3 stools in a 24 hour period.  Your baby's skin or the white part of his or her eyes becomes more yellow.   Your baby is not gaining weight by 5 days of age. MAKE SURE YOU:   Understand these instructions.  Will watch your condition.  Will get help right away if you are not doing well or get worse. Document Released: 10/05/2005 Document Revised: 06/29/2012 Document Reviewed: 05/11/2012 ExitCare Patient Information 2014 [employer].  

## 2013-05-23 NOTE — Progress Notes (Signed)
Sched. IOL@ 39 wks.  Continue 2x/wk testing.  Labs one week ago WNL.  BP is excellent today.

## 2013-05-23 NOTE — Progress Notes (Signed)
P = 91 

## 2013-05-24 LAB — GC/CHLAMYDIA PROBE AMP
CT Probe RNA: NEGATIVE
GC Probe RNA: NEGATIVE

## 2013-05-25 ENCOUNTER — Ambulatory Visit (INDEPENDENT_AMBULATORY_CARE_PROVIDER_SITE_OTHER): Payer: BC Managed Care – PPO | Admitting: Obstetrics and Gynecology

## 2013-05-25 ENCOUNTER — Encounter: Payer: Self-pay | Admitting: Obstetrics and Gynecology

## 2013-05-25 DIAGNOSIS — O34219 Maternal care for unspecified type scar from previous cesarean delivery: Secondary | ICD-10-CM

## 2013-05-25 DIAGNOSIS — O10013 Pre-existing essential hypertension complicating pregnancy, third trimester: Secondary | ICD-10-CM

## 2013-05-25 DIAGNOSIS — Z3480 Encounter for supervision of other normal pregnancy, unspecified trimester: Secondary | ICD-10-CM

## 2013-05-25 DIAGNOSIS — O09299 Supervision of pregnancy with other poor reproductive or obstetric history, unspecified trimester: Secondary | ICD-10-CM

## 2013-05-25 DIAGNOSIS — E669 Obesity, unspecified: Secondary | ICD-10-CM

## 2013-05-25 DIAGNOSIS — O9989 Other specified diseases and conditions complicating pregnancy, childbirth and the puerperium: Secondary | ICD-10-CM

## 2013-05-25 DIAGNOSIS — O26713 Subluxation of symphysis (pubis) in pregnancy, third trimester: Secondary | ICD-10-CM

## 2013-05-25 DIAGNOSIS — O10019 Pre-existing essential hypertension complicating pregnancy, unspecified trimester: Secondary | ICD-10-CM

## 2013-05-25 DIAGNOSIS — O09293 Supervision of pregnancy with other poor reproductive or obstetric history, third trimester: Secondary | ICD-10-CM

## 2013-05-25 DIAGNOSIS — I10 Essential (primary) hypertension: Secondary | ICD-10-CM

## 2013-05-25 DIAGNOSIS — Z3483 Encounter for supervision of other normal pregnancy, third trimester: Secondary | ICD-10-CM

## 2013-05-25 NOTE — Progress Notes (Signed)
Patient here for NST only. NST reviewed and reactive 

## 2013-05-26 ENCOUNTER — Encounter: Payer: Self-pay | Admitting: Family Medicine

## 2013-05-26 ENCOUNTER — Other Ambulatory Visit: Payer: Self-pay | Admitting: Obstetrics & Gynecology

## 2013-05-26 DIAGNOSIS — E669 Obesity, unspecified: Secondary | ICD-10-CM

## 2013-05-26 DIAGNOSIS — O99210 Obesity complicating pregnancy, unspecified trimester: Secondary | ICD-10-CM

## 2013-05-26 DIAGNOSIS — O10019 Pre-existing essential hypertension complicating pregnancy, unspecified trimester: Secondary | ICD-10-CM

## 2013-05-26 LAB — CULTURE, BETA STREP (GROUP B ONLY)

## 2013-05-29 ENCOUNTER — Ambulatory Visit (INDEPENDENT_AMBULATORY_CARE_PROVIDER_SITE_OTHER): Payer: BC Managed Care – PPO | Admitting: Family Medicine

## 2013-05-29 ENCOUNTER — Ambulatory Visit (HOSPITAL_COMMUNITY)
Admission: RE | Admit: 2013-05-29 | Discharge: 2013-05-29 | Disposition: A | Discharge status: Home / Self Care | Payer: BC Managed Care – PPO | Source: Ambulatory Visit | Attending: Obstetrics & Gynecology | Admitting: Obstetrics & Gynecology

## 2013-05-29 VITALS — BP 132/94 | Wt 320.0 lb

## 2013-05-29 DIAGNOSIS — O09299 Supervision of pregnancy with other poor reproductive or obstetric history, unspecified trimester: Secondary | ICD-10-CM | POA: Insufficient documentation

## 2013-05-29 DIAGNOSIS — O34219 Maternal care for unspecified type scar from previous cesarean delivery: Secondary | ICD-10-CM | POA: Insufficient documentation

## 2013-05-29 DIAGNOSIS — E669 Obesity, unspecified: Secondary | ICD-10-CM | POA: Insufficient documentation

## 2013-05-29 DIAGNOSIS — O10013 Pre-existing essential hypertension complicating pregnancy, third trimester: Secondary | ICD-10-CM

## 2013-05-29 DIAGNOSIS — O10019 Pre-existing essential hypertension complicating pregnancy, unspecified trimester: Secondary | ICD-10-CM | POA: Insufficient documentation

## 2013-05-29 DIAGNOSIS — O99210 Obesity complicating pregnancy, unspecified trimester: Secondary | ICD-10-CM

## 2013-05-29 DIAGNOSIS — Z3480 Encounter for supervision of other normal pregnancy, unspecified trimester: Secondary | ICD-10-CM

## 2013-05-29 LAB — CBC
HCT: 36.5 % (ref 36.0–46.0)
Hemoglobin: 12.1 g/dL (ref 12.0–15.0)
MCH: 29.3 pg (ref 26.0–34.0)
MCHC: 33.2 g/dL (ref 30.0–36.0)
MCV: 88.4 fL (ref 78.0–100.0)
Platelets: 194 10*3/uL (ref 150–400)
RBC: 4.13 MIL/uL (ref 3.87–5.11)
RDW: 13.5 % (ref 11.5–15.5)
WBC: 9.5 10*3/uL (ref 4.0–10.5)

## 2013-05-29 LAB — COMPREHENSIVE METABOLIC PANEL
ALT: 11 U/L (ref 0–35)
AST: 13 U/L (ref 0–37)
Albumin: 3.3 g/dL — ABNORMAL LOW (ref 3.5–5.2)
Alkaline Phosphatase: 86 U/L (ref 39–117)
BUN: 7 mg/dL (ref 6–23)
CO2: 22 mEq/L (ref 19–32)
Calcium: 8.9 mg/dL (ref 8.4–10.5)
Chloride: 107 mEq/L (ref 96–112)
Creat: 0.51 mg/dL (ref 0.50–1.10)
Glucose, Bld: 93 mg/dL (ref 70–99)
Potassium: 4.2 mEq/L (ref 3.5–5.3)
Sodium: 139 mEq/L (ref 135–145)
Total Bilirubin: 0.4 mg/dL (ref 0.3–1.2)
Total Protein: 6 g/dL (ref 6.0–8.3)

## 2013-05-29 NOTE — Patient Instructions (Signed)
Preeclampsia and Eclampsia Preeclampsia is a condition of high blood pressure during pregnancy. It can happen at 20 weeks or later in pregnancy. If high blood pressure occurs in the second half of pregnancy with no other symptoms, it is called gestational hypertension and goes away after the baby is born. If any of the symptoms listed below develop with gestational hypertension, it is then called preeclampsia. Eclampsia (convulsions) may follow preeclampsia. This is one of the reasons for regular prenatal checkups. Early diagnosis and treatment are very important to prevent eclampsia. CAUSES  There is no known cause of preeclampsia/eclampsia in pregnancy. There are several known conditions that may put the pregnant woman at risk, such as:  The first pregnancy.  Having preeclampsia in a past pregnancy.  Having lasting (chronic) high blood pressure.  Having multiples (twins, triplets).  Being age 35 or older.  African American ethnic background.  Having kidney disease or diabetes.  Medical conditions such as lupus or blood diseases.  Being overweight (obese). SYMPTOMS   High blood pressure.  Headaches.  Sudden weight gain.  Swelling of hands, face, legs, and feet.  Protein in the urine.  Feeling sick to your stomach (nauseous) and throwing up (vomiting).  Vision problems (blurred or double vision).  Numbness in the face, arms, legs, and feet.  Dizziness.  Slurred speech.  Preeclampsia can cause growth retardation in the fetus.  Separation (abruption) of the placenta.  Not enough fluid in the amniotic sac (oligohydramnios).  Sensitivity to bright lights.  Belly (abdominal) pain. DIAGNOSIS  If protein is found in the urine in the second half of pregnancy, this is considered preeclampsia. Other symptoms mentioned above may also be present. TREATMENT  It is necessary to treat this.  Your caregiver may prescribe bed rest early in this condition. Plenty of rest and  salt restriction may be all that is needed.  Medicines may be necessary to lower blood pressure if the condition does not respond to more conservative measures.  In more severe cases, hospitalization may be needed:  For treatment of blood pressure.  To control fluid retention.  To monitor the baby to see if the condition is causing harm to the baby.  Hospitalization is the [place] way to treat the first sign of preeclampsia. This is so the mother and baby can be watched closely and blood tests can be done effectively and correctly.  If the condition becomes severe, it may be necessary to induce labor or to remove the infant by surgical means (cesarean section). The [place] cure for preeclampsia/eclampsia is to deliver the baby. Preeclampsia and eclampsia involve risks to mother and infant. Your caregiver will discuss these risks with you. Together, you can work out the [place] possible approach to your problems. Make sure you keep your prenatal visits as scheduled. Not keeping appointments could result in a chronic or permanent injury, pain, disability to you, and death or injury to you or your unborn baby. If there is any problem keeping the appointment, you must call to reschedule. HOME CARE INSTRUCTIONS   Keep your prenatal appointments and tests as scheduled.  Tell your caregiver if you have any of the above risk factors.  Get plenty of rest and sleep.  Eat a balanced diet that is low in salt, and do not add salt to your food.  Avoid stressful situations.  Only take over-the-counter and prescriptions medicines for pain, discomfort, or fever as directed by your caregiver. SEEK IMMEDIATE MEDICAL CARE IF:   You develop severe swelling   anywhere in the body. This usually occurs in the legs.  You gain 5 lb/2.3 kg or more in a week.  You develop a severe headache, dizziness, problems with your vision, or confusion.  You have abdominal pain, nausea, or vomiting.  You have a seizure.  You  have trouble moving any part of your body, or you develop numbness or problems speaking.  You have bruising or abnormal bleeding from anywhere in the body.  You develop a stiff neck.  You pass out. MAKE SURE YOU:   Understand these instructions.  Will watch your condition.  Will get help right away if you are not doing well or get worse. Document Released: 10/02/2000 Document Revised: 12/28/2011 Document Reviewed: 05/18/2008 ExitCare Patient Information 2014 [employer].  Breastfeeding A change in hormones during your pregnancy causes growth of your breast tissue and an increase in number and size of milk ducts. The hormone prolactin allows proteins, sugars, and fats from your blood supply to make breast milk in your milk-producing glands. The hormone progesterone prevents breast milk from being released before the birth of your baby. After the birth of your baby, your progesterone level decreases allowing breast milk to be released. Thoughts of your baby, as well as his or her sucking or crying, can stimulate the release of milk from the milk-producing glands. Deciding to breastfeed (nurse) is one of the [place] choices you can make for you and your baby. The information that follows gives a brief review of the benefits, as well as other important skills to know about breastfeeding. BENEFITS OF BREASTFEEDING For your baby  The first milk (colostrum) helps your baby's digestive system function better.   There are antibodies in your milk that help your baby fight off infections.   Your baby has a lower incidence of asthma, allergies, and sudden infant death syndrome (SIDS).   The nutrients in breast milk are better for your baby than infant formulas.  Breast milk improves your baby's brain development.   Your baby will have less gas, colic, and constipation.  Your baby is less likely to develop other conditions, such as childhood obesity, asthma, or diabetes mellitus. For  you  Breastfeeding helps develop a very special bond between you and your baby.   Breastfeeding is convenient, always available at the correct temperature, and costs nothing.   Breastfeeding helps to burn calories and helps you lose the weight gained during pregnancy.   Breastfeeding makes your uterus contract back down to normal size faster and slows bleeding following delivery.   Breastfeeding mothers have a lower risk of developing osteoporosis or breast or ovarian cancer later in life.  BREASTFEEDING FREQUENCY  A healthy, full-term baby may breastfeed as often as every hour or space his or her feedings to every 3 hours. Breastfeeding frequency will vary from baby to baby.   Newborns should be fed no less than every 2 3 hours during the day and every 4 5 hours during the night. You should breastfeed a minimum of 8 feedings in a 24 hour period.  Awaken your baby to breastfeed if it has been 3 4 hours since the last feeding.  Breastfeed when you feel the need to reduce the fullness of your breasts or when your newborn shows signs of hunger. Signs that your baby may be hungry include:  Increased alertness or activity.  Stretching.  Movement of the head from side to side.  Movement of the head and opening of the mouth when the corner of   the mouth or cheek is stroked (rooting).  Increased sucking sounds, smacking lips, cooing, sighing, or squeaking.  Hand-to-mouth movements.  Increased sucking of fingers or hands.  Fussing.  Intermittent crying.  Signs of extreme hunger will require calming and consoling before you try to feed your baby. Signs of extreme hunger may include:  Restlessness.  A loud, strong cry.  Screaming.  Frequent feeding will help you make more milk and will help prevent problems, such as sore nipples and engorgement of the breasts.  BREASTFEEDING   Whether lying down or sitting, be sure that the baby's abdomen is facing your abdomen.    Support your breast with 4 fingers under your breast and your thumb above your nipple. Make sure your fingers are well away from your nipple and your baby's mouth.   Stroke your baby's lips gently with your finger or nipple.   When your baby's mouth is open wide enough, place all of your nipple and as much of the colored area around your nipple (areola) as possible into your baby's mouth.  More areola should be visible above his or her upper lip than below his or her lower lip.  Your baby's tongue should be between his or her lower gum and your breast.  Ensure that your baby's mouth is correctly positioned around the nipple (latched). Your baby's lips should create a seal on your breast.  Signs that your baby has effectively latched onto your nipple include:  Tugging or sucking without pain.  Swallowing heard between sucks.  Absent click or smacking sound.  Muscle movement above and in front of his or her ears with sucking.  Your baby must suck about 2 3 minutes in order to get your milk. Allow your baby to feed on each breast as long as he or she wants. Nurse your baby until he or she unlatches or falls asleep at the first breast, then offer the second breast.  Signs that your baby is full and satisfied include:  A gradual decrease in the number of sucks or complete cessation of sucking.  Falling asleep.  Extension or relaxation of his or her body.  Retention of a small amount of milk in his or her mouth.  Letting go of your breast by himself or herself.  Signs of effective breastfeeding in you include:  Breasts that have increased firmness, weight, and size prior to feeding.  Breasts that are softer after nursing.  Increased milk volume, as well as a change in milk consistency and color by the 5th day of breastfeeding.  Breast fullness relieved by breastfeeding.  Nipples are not sore, cracked, or bleeding.  If needed, break the suction by putting your finger  into the corner of your baby's mouth and sliding your finger between his or her gums. Then, remove your breast from his or her mouth.  It is common for babies to spit up a small amount after a feeding.  Babies often swallow air during feeding. This can make babies fussy. Burping your baby between breasts can help with this.  Vitamin D supplements are recommended for babies who get only breast milk.  Avoid using a pacifier during your baby's first 4 6 weeks.  Avoid supplemental feedings of water, formula, or juice in place of breastfeeding. Breast milk is all the food your baby needs. It is not necessary for your baby to have water or formula. Your breasts will make more milk if supplemental feedings are avoided during the early weeks. HOW TO   TELL WHETHER YOUR BABY IS GETTING ENOUGH BREAST MILK Wondering whether or not your baby is getting enough milk is a common concern among mothers. You can be assured that your baby is getting enough milk if:   Your baby is actively sucking and you hear swallowing.   Your baby seems relaxed and satisfied after a feeding.   Your baby nurses at least 8 12 times in a 24 hour time period.  During the first 3 5 days of age:  Your baby is wetting at least 3 5 diapers in a 24 hour period. The urine should be clear and pale yellow.  Your baby is having at least 3 4 stools in a 24 hour period. The stool should be soft and yellow.  At 5 7 days of age, your baby is having at least 3 6 stools in a 24 hour period. The stool should be seedy and yellow by 5 days of age.  Your baby has a weight loss less than 7 10% during the first 3 days of age.  Your baby does not lose weight after 3 7 days of age.  Your baby gains 4 7 ounces each week after he or she is 4 days of age.  Your baby gains weight by 5 days of age and is back to birth weight within 2 weeks. ENGORGEMENT In the first week after your baby is born, you may experience extremely full breasts  (engorgement). When engorged, your breasts may feel heavy, warm, or tender to the touch. Engorgement peaks within 24 48 hours after delivery of your baby.  Engorgement may be reduced by:  Continuing to breastfeed.  Increasing the frequency of breastfeeding.  Taking warm showers or applying warm, moist heat to your breasts just before each feeding. This increases circulation and helps the milk flow.   Gently massaging your breast before and during the feedings. With your fingertips, massage from your chest wall towards your nipple in a circular motion.   Ensuring that your baby empties at least one breast at every feeding. It also helps to start the next feeding on the opposite breast.   Expressing breast milk by hand or by using a breast pump to empty the breasts if your baby is sleepy, or not nursing well. You may also want to express milk if you are returning to work oryou feel you are getting engorged.  Ensuring your baby is latched on and positioned properly while breastfeeding. If you follow these suggestions, your engorgement should improve in 24 48 hours. If you are still experiencing difficulty, call your lactation consultant or caregiver.  CARING FOR YOURSELF Take care of your breasts.  Bathe or shower daily.   Avoid using soap on your nipples.   Wear a supportive bra. Avoid wearing underwire style bras.  Air dry your nipples for a 3 4minutes after each feeding.   Use only cotton bra pads to absorb breast milk leakage. Leaking of breast milk between feedings is normal.   Use only pure lanolin on your nipples after nursing. You do not need to wash it off before feeding your baby again. Another option is to express a few drops of breast milk and gently massage that milk into your nipples.  Continue breast self-awareness checks. Take care of yourself.  Eat healthy foods. Alternate 3 meals with 3 snacks.  Avoid foods that you notice affect your baby in a bad  way.  Drink milk, fruit juice, and water to satisfy your thirst (about 8   glasses a day).   Rest often, relax, and take your prenatal vitamins to prevent fatigue, stress, and anemia.  Avoid chewing and smoking tobacco.  Avoid alcohol and drug use.  Take over-the-counter and prescribed medicine only as directed by your caregiver or pharmacist. You should always check with your caregiver or pharmacist before taking any new medicine, vitamin, or herbal supplement.  Know that pregnancy is possible while breastfeeding. If desired, talk to your caregiver about family planning and safe birth control methods that may be used while breastfeeding. SEEK MEDICAL CARE IF:   You feel like you want to stop breastfeeding or have become frustrated with breastfeeding.  You have painful breasts or nipples.  Your nipples are cracked or bleeding.  Your breasts are red, tender, or warm.  You have a swollen area on either breast.  You have a fever or chills.  You have nausea or vomiting.  You have drainage from your nipples.  Your breasts do not become full before feedings by the 5th day after delivery.  You feel sad and depressed.  Your baby is too sleepy to eat well.  Your baby is having trouble sleeping.   Your baby is wetting less than 3 diapers in a 24 hour period.  Your baby has less than 3 stools in a 24 hour period.  Your baby's skin or the white part of his or her eyes becomes more yellow.   Your baby is not gaining weight by 5 days of age. MAKE SURE YOU:   Understand these instructions.  Will watch your condition.  Will get help right away if you are not doing well or get worse. Document Released: 10/05/2005 Document Revised: 06/29/2012 Document Reviewed: 05/11/2012 ExitCare Patient Information 2014 [employer].  

## 2013-05-29 NOTE — Progress Notes (Signed)
P-88  Patient is here today to discuss delivering earlier than previously planned.  She is feeling more symptomatic.  NST and BPP at [facility] today was wnl.

## 2013-05-29 NOTE — Progress Notes (Signed)
Reports facial swelling.  Since starting labetalol her BP's have been good.  Will check labs--discussed indications for early delivery. NST reviewed and reactive from earlier today at [facility].

## 2013-05-30 LAB — PROTEIN / CREATININE RATIO, URINE
Creatinine, Urine: 146 mg/dL
Protein Creatinine Ratio: 0.06 (ref <0.15)
Total Protein, Urine: 9 mg/dL

## 2013-05-31 ENCOUNTER — Telehealth (HOSPITAL_COMMUNITY): Payer: Self-pay | Admitting: *Deleted

## 2013-05-31 ENCOUNTER — Encounter (HOSPITAL_COMMUNITY): Payer: Self-pay | Admitting: *Deleted

## 2013-05-31 NOTE — Telephone Encounter (Signed)
Preadmission screen  

## 2013-06-01 ENCOUNTER — Ambulatory Visit (INDEPENDENT_AMBULATORY_CARE_PROVIDER_SITE_OTHER): Payer: BC Managed Care – PPO | Admitting: Obstetrics & Gynecology

## 2013-06-01 VITALS — BP 128/83 | Wt 321.0 lb

## 2013-06-01 DIAGNOSIS — I10 Essential (primary) hypertension: Secondary | ICD-10-CM

## 2013-06-01 DIAGNOSIS — O09292 Supervision of pregnancy with other poor reproductive or obstetric history, second trimester: Secondary | ICD-10-CM

## 2013-06-01 DIAGNOSIS — O0990 Supervision of high risk pregnancy, unspecified, unspecified trimester: Secondary | ICD-10-CM

## 2013-06-01 DIAGNOSIS — O09293 Supervision of pregnancy with other poor reproductive or obstetric history, third trimester: Secondary | ICD-10-CM

## 2013-06-01 DIAGNOSIS — O10019 Pre-existing essential hypertension complicating pregnancy, unspecified trimester: Secondary | ICD-10-CM

## 2013-06-01 DIAGNOSIS — R1011 Right upper quadrant pain: Secondary | ICD-10-CM

## 2013-06-01 DIAGNOSIS — O0993 Supervision of high risk pregnancy, unspecified, third trimester: Secondary | ICD-10-CM

## 2013-06-01 DIAGNOSIS — O10013 Pre-existing essential hypertension complicating pregnancy, third trimester: Secondary | ICD-10-CM

## 2013-06-01 DIAGNOSIS — O09299 Supervision of pregnancy with other poor reproductive or obstetric history, unspecified trimester: Secondary | ICD-10-CM

## 2013-06-01 LAB — COMPREHENSIVE METABOLIC PANEL
ALT: 11 U/L (ref 0–35)
AST: 14 U/L (ref 0–37)
Albumin: 3.3 g/dL — ABNORMAL LOW (ref 3.5–5.2)
Alkaline Phosphatase: 89 U/L (ref 39–117)
BUN: 8 mg/dL (ref 6–23)
CO2: 24 mEq/L (ref 19–32)
Calcium: 8.9 mg/dL (ref 8.4–10.5)
Chloride: 106 mEq/L (ref 96–112)
Creat: 0.59 mg/dL (ref 0.50–1.10)
Glucose, Bld: 80 mg/dL (ref 70–99)
Potassium: 4 mEq/L (ref 3.5–5.3)
Sodium: 140 mEq/L (ref 135–145)
Total Bilirubin: 0.4 mg/dL (ref 0.3–1.2)
Total Protein: 5.9 g/dL — ABNORMAL LOW (ref 6.0–8.3)

## 2013-06-01 NOTE — Progress Notes (Signed)
Reports right upper quadrant pain, headaches are better and come and go.  She has seen higher bp at home this week as compared to last week.

## 2013-06-01 NOTE — Patient Instructions (Signed)
Return to clinic for any obstetric concerns or go to MAU for evaluation  

## 2013-06-01 NOTE — Progress Notes (Signed)
+  RUQ pain on palpation, no rebound, no guarding.  Pain is colicky.  Had normal CMET on 05/29/13, will recheck today. Reports BP of 160/90 at home, she says she took it with a appropriate cuff (she is a [employer]).  No current headache or visual symptoms.  Still has gallbladder.  Will follow up CMET, add on RUQ ultrasound on Monday to evaluate gall bladder/liver.  NST performed today was reviewed and was found to be reactive.  Continue recommended antenatal testing and prenatal care. No other complaints or concerns.  Fetal movement, preeclampsia and labor precautions reviewed.

## 2013-06-02 ENCOUNTER — Other Ambulatory Visit: Payer: Self-pay | Admitting: Obstetrics & Gynecology

## 2013-06-02 DIAGNOSIS — O169 Unspecified maternal hypertension, unspecified trimester: Secondary | ICD-10-CM

## 2013-06-03 ENCOUNTER — Encounter (HOSPITAL_COMMUNITY): Payer: Self-pay | Admitting: *Deleted

## 2013-06-03 ENCOUNTER — Inpatient Hospital Stay (HOSPITAL_COMMUNITY)
Admission: AD | Admit: 2013-06-03 | Discharge: 2013-06-06 | DRG: 372 | Disposition: A | Discharge status: Home / Self Care | Payer: BC Managed Care – PPO | Source: Ambulatory Visit | Attending: Obstetrics & Gynecology | Admitting: Obstetrics & Gynecology

## 2013-06-03 DIAGNOSIS — O10013 Pre-existing essential hypertension complicating pregnancy, third trimester: Secondary | ICD-10-CM

## 2013-06-03 DIAGNOSIS — O09293 Supervision of pregnancy with other poor reproductive or obstetric history, third trimester: Secondary | ICD-10-CM

## 2013-06-03 DIAGNOSIS — O1002 Pre-existing essential hypertension complicating childbirth: Principal | ICD-10-CM | POA: Diagnosis present

## 2013-06-03 DIAGNOSIS — N750 Cyst of Bartholin's gland: Secondary | ICD-10-CM | POA: Diagnosis present

## 2013-06-03 DIAGNOSIS — O2390 Unspecified genitourinary tract infection in pregnancy, unspecified trimester: Secondary | ICD-10-CM | POA: Diagnosis present

## 2013-06-03 DIAGNOSIS — O26713 Subluxation of symphysis (pubis) in pregnancy, third trimester: Secondary | ICD-10-CM

## 2013-06-03 DIAGNOSIS — O0993 Supervision of high risk pregnancy, unspecified, third trimester: Secondary | ICD-10-CM

## 2013-06-03 DIAGNOSIS — O34219 Maternal care for unspecified type scar from previous cesarean delivery: Secondary | ICD-10-CM

## 2013-06-03 DIAGNOSIS — I10 Essential (primary) hypertension: Secondary | ICD-10-CM

## 2013-06-03 LAB — COMPREHENSIVE METABOLIC PANEL
ALT: 11 U/L (ref 0–35)
AST: 13 U/L (ref 0–37)
Albumin: 2.7 g/dL — ABNORMAL LOW (ref 3.5–5.2)
Alkaline Phosphatase: 88 U/L (ref 39–117)
BUN: 7 mg/dL (ref 6–23)
CO2: 23 mEq/L (ref 19–32)
Calcium: 9.2 mg/dL (ref 8.4–10.5)
Chloride: 101 mEq/L (ref 96–112)
Creatinine, Ser: 0.53 mg/dL (ref 0.50–1.10)
GFR calc Af Amer: >90 mL/min (ref >90)
GFR calc non Af Amer: >90 mL/min (ref >90)
Glucose, Bld: 85 mg/dL (ref 70–99)
Potassium: 3.5 mEq/L (ref 3.5–5.1)
Sodium: 136 mEq/L (ref 135–145)
Total Bilirubin: 0.2 mg/dL — ABNORMAL LOW (ref 0.3–1.2)
Total Protein: 5.7 g/dL — ABNORMAL LOW (ref 6.0–8.3)

## 2013-06-03 LAB — CBC
HCT: 33.4 % — ABNORMAL LOW (ref 36.0–46.0)
Hemoglobin: 11.4 g/dL — ABNORMAL LOW (ref 12.0–15.0)
MCH: 29.7 pg (ref 26.0–34.0)
MCHC: 34.1 g/dL (ref 30.0–36.0)
MCV: 87 fL (ref 78.0–100.0)
Platelets: 176 10*3/uL (ref 150–400)
RBC: 3.84 MIL/uL — ABNORMAL LOW (ref 3.87–5.11)
RDW: 13.4 % (ref 11.5–15.5)
WBC: 9.2 10*3/uL (ref 4.0–10.5)

## 2013-06-03 LAB — URINALYSIS, ROUTINE W REFLEX MICROSCOPIC
Bilirubin Urine: NEGATIVE
Glucose, UA: NEGATIVE mg/dL
Hgb urine dipstick: NEGATIVE
Ketones, ur: NEGATIVE mg/dL
Leukocytes, UA: NEGATIVE
Nitrite: NEGATIVE
Protein, ur: NEGATIVE mg/dL
Specific Gravity, Urine: 1.015 (ref 1.005–1.030)
Urobilinogen, UA: 0.2 mg/dL (ref 0.0–1.0)
pH: 6.5 (ref 5.0–8.0)

## 2013-06-03 LAB — PROTEIN / CREATININE RATIO, URINE
Creatinine, Urine: 53.2 mg/dL
Protein Creatinine Ratio: 0.11 (ref 0.00–0.15)
Total Protein, Urine: 5.7 mg/dL

## 2013-06-03 NOTE — MAU Note (Signed)
PT SAYS  ON Friday- HER BP'S  HAVE BEEN  HIGH   AT HOME- SHE IS TAKING THEM MANUALLY  AT HOME  -   170/100, 158/110,166,110.    GETS [facility] AT [facility].  IN OFFICE - DID LABS ON Monday AND Thursday-   RESULTS ALL NL.  SHE CALLED AT 5 PM  AND ASKED IF SHE COULD INCREASE  LABETALOL-  YES - NOT TAKEN YET AND IF MORE S/S  COME TO MAU.   SO AT 715PM- SHE   FEELS BABY MOVE LESS-  ALL NST'S GREAT.   FHR IN TRIAGE- 158.      THEN DEVELOPED H/A ,  NAUSEATED, CHILLS,  .      VE - 2 WEEKS AGO- 1 CM.     DENIES HSV AND MRSA.      SHE IS [facility] FOR U/S ON Monday FOR  GALLBLADDER.

## 2013-06-04 ENCOUNTER — Encounter (HOSPITAL_COMMUNITY): Payer: Self-pay | Admitting: *Deleted

## 2013-06-04 LAB — TYPE AND SCREEN
ABO/RH(D): B POS
Antibody Screen: NEGATIVE

## 2013-06-04 LAB — ABO/RH: ABO/RH(D): B POS

## 2013-06-04 LAB — RPR: RPR Ser Ql: NONREACTIVE

## 2013-06-04 MED ORDER — ACETAMINOPHEN 325 MG PO TABS
650.0000 mg | ORAL_TABLET | ORAL | Status: DC | PRN
  Administered 2013-06-04: 650 mg via ORAL
  Filled 2013-06-04: qty 1

## 2013-06-04 MED ORDER — OXYTOCIN 40 UNITS IN LACTATED RINGERS INFUSION - SIMPLE MED
62.5000 mL/h | INTRAVENOUS | Status: DC
  Administered 2013-06-05: 62.5 mL/h via INTRAVENOUS
  Filled 2013-06-04: qty 1000

## 2013-06-04 MED ORDER — TERBUTALINE SULFATE 1 MG/ML IJ SOLN: 0.2500 mg | Freq: Once | INTRAMUSCULAR | Status: AC | PRN

## 2013-06-04 MED ORDER — IBUPROFEN 600 MG PO TABS
600.0000 mg | ORAL_TABLET | Freq: Four times a day (QID) | ORAL | Status: DC | PRN
  Administered 2013-06-05: 600 mg via ORAL
  Filled 2013-06-04: qty 1

## 2013-06-04 MED ORDER — ONDANSETRON HCL 4 MG/2ML IJ SOLN: 4.0000 mg | Freq: Four times a day (QID) | INTRAMUSCULAR | Status: DC | PRN

## 2013-06-04 MED ORDER — CITRIC ACID-SODIUM CITRATE 334-500 MG/5ML PO SOLN
30.0000 mL | ORAL | Status: DC | PRN
  Administered 2013-06-04: 30 mL via ORAL
  Filled 2013-06-04: qty 15

## 2013-06-04 MED ORDER — LACTATED RINGERS IV SOLN: 500.0000 mL | INTRAVENOUS | Status: DC | PRN

## 2013-06-04 MED ORDER — FENTANYL CITRATE 0.05 MG/ML IJ SOLN
100.0000 ug | INTRAMUSCULAR | Status: DC | PRN
  Filled 2013-06-04: qty 2

## 2013-06-04 MED ORDER — LIDOCAINE HCL (PF) 1 % IJ SOLN
30.0000 mL | INTRAMUSCULAR | Status: DC | PRN
  Filled 2013-06-04 (×2): qty 30

## 2013-06-04 MED ORDER — OXYCODONE-ACETAMINOPHEN 5-325 MG PO TABS: 1.0000 | ORAL_TABLET | ORAL | Status: DC | PRN

## 2013-06-04 MED ORDER — OXYTOCIN 40 UNITS IN LACTATED RINGERS INFUSION - SIMPLE MED
1.0000 m[IU]/min | INTRAVENOUS | Status: DC
  Administered 2013-06-04: 4 m[IU]/min via INTRAVENOUS
  Administered 2013-06-04: 2 m[IU]/min via INTRAVENOUS

## 2013-06-04 MED ORDER — LACTATED RINGERS IV SOLN
INTRAVENOUS | Status: DC
  Administered 2013-06-04 – 2013-06-05 (×4): via INTRAVENOUS

## 2013-06-04 MED ORDER — OXYTOCIN BOLUS FROM INFUSION: 500.0000 mL | INTRAVENOUS | Status: DC

## 2013-06-04 MED ORDER — ACETAMINOPHEN 325 MG PO TABS: 650.0000 mg | ORAL_TABLET | Freq: Four times a day (QID) | ORAL | Status: DC | PRN

## 2013-06-04 NOTE — H&P (Signed)
[name] is a 32 y.o. female presenting for worsening HTN with blood pressure 170/100 at home. No headache, scotomata but doesn't feel well, thinks she should be induced. Good fetal movement, rare UC, no ROM Maternal Medical History:  Reason for admission: Elevated blood pressure  Fetal activity: Perceived fetal activity is normal.   Last perceived fetal movement was within the past hour.    Prenatal complications: PIH.   Prenatal Complications - Diabetes: none.    OB History   Grav Para Term Preterm Abortions TAB SAB Ect Mult Living   5 3 2 1 1  1   3      Past Medical History  Diagnosis Date  . Anxiety   . Pregnancy induced hypertension     labetolol  . Kidney stones 2013    13 passed  . Gestational diabetes 2008 and 2010  . Preterm labor     delivery   Past Surgical History  Procedure Laterality Date  . Cesarean section  2006  . Wisdom tooth extraction      x4   Family History: family history includes Anxiety disorder in her mother; Atrial fibrillation in her mother; Depression in her mother; Schizophrenia in her maternal grandmother. Social History:  reports that she has never smoked. She has never used smokeless tobacco. She reports that she does not drink alcohol or use illicit drugs.   Prenatal Transfer Tool  Maternal Diabetes: No Genetic Screening: Normal Maternal Ultrasounds/Referrals: Normal Fetal Ultrasounds or other Referrals:  Referred to Materal Fetal Medicine  Maternal Substance Abuse:  No Significant Maternal Medications:  Meds include: Other: labetalol Significant Maternal Lab Results:  None Other Comments:  obesity, chronic HTN  Review of Systems  Constitutional: Negative for fever.  Respiratory: Negative for shortness of breath.   Cardiovascular: Negative for chest pain.  Gastrointestinal: Negative for vomiting.      Blood pressure 149/82, pulse 85, temperature 98.2 F (36.8 C), temperature source Oral, resp. rate 18, height 6\' 1"   (1.854 m), weight 325 lb 6 oz (147.589 kg), last menstrual period 09/11/2012, unknown if currently breastfeeding. Maternal Exam:  Abdomen: Surgical scars: low transverse.   Fundal height is 38.   Estimated fetal weight is 7 lb.    Introitus: not evaluated.   Cervix: not evaluated.   Physical Exam  Constitutional: She is oriented to person, place, and time. She appears well-developed. No distress.  Morbid obesity  HENT:  Head: Normocephalic.  Neck: Normal range of motion. No thyromegaly present.  Cardiovascular: Normal rate and regular rhythm.   Respiratory: Effort normal and breath sounds normal.  GI: Soft. There is no tenderness.  Musculoskeletal: She exhibits edema (1+ edema below knees).  Neurological: She is alert and oriented to person, place, and time.  Skin: Skin is warm and dry.  Psychiatric: She has a normal mood and affect. Her behavior is normal.    Prenatal labs: ABO, Rh: B/POS/-- (01/06 1209) Antibody: NEG (01/06 1209) Rubella: 1.19 (01/06 1209) RPR: NON REAC (06/03 1402)  HBsAg: NEGATIVE (01/06 1209)  HIV: NON REACTIVE (06/03 1402)  GBS: Negative (08/05 0000)   Assessment/Plan: Worsening Chronic hypertension with severe range pressures at home and in MAU at [redacted] week gestation. Previous C/S with 2 VBAC, requests TOLAC. Offered IOL, will admit.   Correne Lalani 06/04/2013, 12:10 AM

## 2013-06-04 NOTE — Progress Notes (Signed)
[name] is a 32 y.o. [phone] at [redacted]w[redacted]d admitted for induction of labor due to worsening CHTN.  Subjective: Feeling some intermittent cramping. Foley bulb out.   Objective: BP 140/79  Pulse 91  Temp(Src) 98.6 F (37 C) (Oral)  Resp 18  Ht 6\' 1"  (1.854 m)  Wt 147.419 kg (325 lb)  BMI 42.89 kg/m2  LMP 09/11/2012      FHT:  FHR: 135 bpm, variability: moderate,  accelerations:  Present,  decelerations:  Absent UC:   Occasional, mild Foley bulb out  SVE:   5.5/50/-3, vtx, too high for AROM, sm-mod amount bright red blood on inner thighs. No active bleeding. No abdominal pain or tenderness, other than intermittent cramping  Labs: Lab Results  Component Value Date   WBC 9.2 06/03/2013   HGB 11.4* 06/03/2013   HCT 33.4* 06/03/2013   MCV 87.0 06/03/2013   PLT 176 06/03/2013    Assessment / Plan: IOL d/t worsening CHTN, cervical foley bulb out, vtx too high to arom, will begin pitocin per protocol, not to exceed 90mu/min  Labor: n/a Preeclampsia:  n/a Fetal Wellbeing:  Category I Pain Control:  Labor support without medications I/D:  n/a Anticipated MOD:  TOLAC Continue to observe bleeding  Marge Duncans 06/04/2013, 5:28 AM

## 2013-06-04 NOTE — Progress Notes (Signed)
[name] is a 32 y.o. [phone] at [redacted]w[redacted]d admitted for IOL for worsening CHTN   Subjective: Comfortable, beginning to feel uc's somewhat  Objective: BP 128/56  Pulse 92  Temp(Src) 98.7 F (37.1 C) (Oral)  Resp 20  Ht 6\' 1"  (1.854 m)  Wt 147.419 kg (325 lb)  BMI 42.89 kg/m2  LMP 09/11/2012      FHT:  FHR: 145 bpm, variability: moderate,  accelerations:  Present,  decelerations:  Absent UC:   Irregular, mild SVE:   Dilation: 5.5 Effacement (%): 50 Station: -3 Exam by:: Majd [employer] Labs: Lab Results  Component Value Date   WBC 9.2 06/03/2013   HGB 11.4* 06/03/2013   HCT 33.4* 06/03/2013   MCV 87.0 06/03/2013   PLT 176 06/03/2013    Assessment / Plan: IOL d/t worsening CHTN at term, BPs doing well now, Pitocin at 34mu/min- to continue increasing per protocol to achieve adequate labor  Labor: n/a Preeclampsia:  n/a Fetal Wellbeing:  Category I Pain Control:  n/a I/D:  n/a Anticipated MOD:  VBAC  Devontay Celaya L 06/04/2013, 10:59 AM

## 2013-06-04 NOTE — Progress Notes (Signed)
[facility] applied

## 2013-06-04 NOTE — Progress Notes (Signed)
   ADALAY AZUCENA is a 32 y.o. [phone] at [redacted]w[redacted]d  admitted for induction of labor due to Hypertension.  Subjective:  Not feeling contractions Objective: BP 123/73  Pulse 112  Temp(Src) 98.8 F (37.1 C) (Oral)  Resp 20  Ht 6\' 1"  (1.854 m)  Wt 147.419 kg (325 lb)  BMI 42.89 kg/m2  LMP 09/11/2012    FHT:  FHR: 140 bpm, variability: moderate,  accelerations:  Present,  decelerations:  Absent UC:   rare SVE:   Dilation: 5.5 Effacement (%): 50 Station: Ballotable Exam by:: Drenda Freeze CNM Pitocin @ 10 mu/min  Labs: Lab Results  Component Value Date   WBC 9.2 06/03/2013   HGB 11.4* 06/03/2013   HCT 33.4* 06/03/2013   MCV 87.0 06/03/2013   PLT 176 06/03/2013    Assessment / Plan: IOL for worsening CHTN. Ripening phase complete  Labor: poor contractions pattern.  Will continue to increase Pit.  Consider AROM with fundal pressure/FSE if still unresponsive to pit Fetal Wellbeing:  Category I Pain Control:  Labor support without medications Anticipated MOD:  NSVD  CRESENZO-DISHMAN,Fernanda Twaddell 06/04/2013, 9:14 PM

## 2013-06-04 NOTE — Progress Notes (Signed)
[name] is a 32 y.o. [phone] at [redacted]w[redacted]d admitted for worsening CHTN  Subjective: Comfortable, beginning to feel uc's somewhat  Objective: BP 113/66  Pulse 102  Temp(Src) 98.2 F (36.8 C) (Oral)  Resp 20  Ht 6\' 1"  (1.854 m)  Wt 147.419 kg (325 lb)  BMI 42.89 kg/m2  LMP 09/11/2012      FHT:  FHR: 145 bpm, variability: moderate,  accelerations:  Present,  decelerations:  Absent UC:   Irregular, mild SVE:   Dilation: 5.5 Effacement (%): 50 Station: -3 Exam by:: Genella Rife, CNM earlier when FB fell out, SVE deferred at present No bleeding noted at this time  Labs: Lab Results  Component Value Date   WBC 9.2 06/03/2013   HGB 11.4* 06/03/2013   HCT 33.4* 06/03/2013   MCV 87.0 06/03/2013   PLT 176 06/03/2013    Assessment / Plan: IOL d/t worsening CHTN at term, BPs doing well now, Pitocin at 72mu/min- to continue increasing per protocol to achieve adequate labor  Labor: n/a Preeclampsia:  n/a Fetal Wellbeing:  Category I Pain Control:  n/a I/D:  n/a Anticipated MOD:  VBAC  Marge Duncans 06/04/2013, 8:44 AM

## 2013-06-04 NOTE — Progress Notes (Signed)
Vbac consent signed and placed on chart, foley bulb placed by K. Booker,CNM.  Pt tolerates well

## 2013-06-04 NOTE — Progress Notes (Signed)
[name] is a 32 y.o. [id number] at [redacted]w[redacted]d admitted for IOL for cHTN   Subjective:  Not feeling contractions much.  4/10 on a scale. +FM  Objective: BP 133/63  Pulse 93  Temp(Src) 98.9 F (37.2 C) (Oral)  Resp 20  Ht 6\' 1"  (1.854 m)  Wt 147.419 kg (325 lb)  BMI 42.89 kg/m2  LMP 09/11/2012      FHT:  FHR: 150 bpm, variability: moderate,  accelerations:  Present,  decelerations:  Absent UC:   Difficult to trace on the beacon. approx every 3-6 min. Irregular.  SVE:   Dilation: 5.5 Effacement (%): 50 Station: Ballotable Exam by:: [employer]: Lab Results  Component Value Date   WBC 9.2 06/03/2013   HGB 11.4* 06/03/2013   HCT 33.4* 06/03/2013   MCV 87.0 06/03/2013   PLT 176 06/03/2013    Assessment / Plan: IOL for cHTN. In latent labor  Labor: poor contraction pattern. will let rest, shower and eat. d/c pit. will restart in 2 hours CHTN: no severe range pressures Fetal Wellbeing:  Category I Pain Control:  Labor support without medications I/D:  n/a Anticipated MOD:  NSVD  [name] L 06/04/2013, 3:15 PM

## 2013-06-04 NOTE — Progress Notes (Signed)
[name] is a 32 y.o. [id number] at [redacted]w[redacted]d by admitted for induction of labor due to [facility] w/ worsening bp's.  Subjective: Denies ha, scotomata, ruq/epigastric pain, vomiting at this time.  Does have some nausea.     Objective: BP 129/63  Pulse 77  Temp(Src) 98.6 F (37 C) (Oral)  Resp 18  Ht 6\' 1"  (1.854 m)  Wt 147.419 kg (325 lb)  BMI 42.89 kg/m2  LMP 09/11/2012      FHT:  FHR: 145 bpm, variability: moderate,  accelerations:  Present,  decelerations:  Absent UC:   Mild, occasional SVE:   Dilation: 1 Effacement (%): 50 Station: -3 Exam by:: K. Rhesa Forsberg,CNM Cervical foley bulb inserted and inflated w/ 60ml LR w/o difficulty   Labs: Lab Results  Component Value Date   WBC 9.2 06/03/2013   HGB 11.4* 06/03/2013   HCT 33.4* 06/03/2013   MCV 87.0 06/03/2013   PLT 176 06/03/2013    Assessment / Plan: IOL d/t CHTN w/ worsening BPs, normal pre-e labs. Cervical foley bulb in place, will begin pitocin per protocol when it falls out. VBAC consent signed.   Labor: cervical ripening phase Preeclampsia:  n/a Fetal Wellbeing:  Category I Pain Control:  n/a I/D:  n/a Anticipated MOD:  VBAC  Marge Duncans 06/04/2013, 1:48 AM

## 2013-06-05 ENCOUNTER — Ambulatory Visit (HOSPITAL_COMMUNITY): Admission: RE | Admit: 2013-06-05 | Payer: BC Managed Care – PPO | Source: Ambulatory Visit

## 2013-06-05 ENCOUNTER — Ambulatory Visit (HOSPITAL_COMMUNITY): Payer: BC Managed Care – PPO

## 2013-06-05 ENCOUNTER — Encounter (HOSPITAL_COMMUNITY): Payer: Self-pay | Admitting: *Deleted

## 2013-06-05 ENCOUNTER — Encounter (HOSPITAL_COMMUNITY): Payer: Self-pay | Admitting: Anesthesiology

## 2013-06-05 ENCOUNTER — Inpatient Hospital Stay (HOSPITAL_COMMUNITY): Payer: BC Managed Care – PPO | Admitting: Anesthesiology

## 2013-06-05 DIAGNOSIS — O34219 Maternal care for unspecified type scar from previous cesarean delivery: Secondary | ICD-10-CM

## 2013-06-05 DIAGNOSIS — N750 Cyst of Bartholin's gland: Secondary | ICD-10-CM

## 2013-06-05 DIAGNOSIS — O2390 Unspecified genitourinary tract infection in pregnancy, unspecified trimester: Secondary | ICD-10-CM

## 2013-06-05 DIAGNOSIS — O1002 Pre-existing essential hypertension complicating childbirth: Secondary | ICD-10-CM

## 2013-06-05 MED ORDER — OXYCODONE-ACETAMINOPHEN 5-325 MG PO TABS
1.0000 | ORAL_TABLET | ORAL | Status: DC | PRN
  Administered 2013-06-05: 1 via ORAL
  Administered 2013-06-05: 2 via ORAL
  Filled 2013-06-05: qty 2
  Filled 2013-06-05: qty 1

## 2013-06-05 MED ORDER — ONDANSETRON HCL 4 MG PO TABS: 4.0000 mg | ORAL_TABLET | ORAL | Status: DC | PRN

## 2013-06-05 MED ORDER — ZOLPIDEM TARTRATE 5 MG PO TABS: 5.0000 mg | ORAL_TABLET | Freq: Every evening | ORAL | Status: DC | PRN

## 2013-06-05 MED ORDER — FENTANYL 2.5 MCG/ML BUPIVACAINE 1/10 % EPIDURAL INFUSION (WH - ANES)
14.0000 mL/h | INTRAMUSCULAR | Status: DC | PRN
  Administered 2013-06-05: 14 mL/h via EPIDURAL
  Filled 2013-06-05: qty 125

## 2013-06-05 MED ORDER — PRENATAL MULTIVITAMIN CH
1.0000 | ORAL_TABLET | Freq: Every day | ORAL | Status: DC
  Administered 2013-06-05 – 2013-06-06 (×2): 1 via ORAL
  Filled 2013-06-05 (×2): qty 1

## 2013-06-05 MED ORDER — PHENYLEPHRINE 40 MCG/ML (10ML) SYRINGE FOR IV PUSH (FOR BLOOD PRESSURE SUPPORT)
80.0000 ug | PREFILLED_SYRINGE | INTRAVENOUS | Status: DC | PRN
  Filled 2013-06-05: qty 2

## 2013-06-05 MED ORDER — TETANUS-DIPHTH-ACELL PERTUSSIS 5-2.5-18.5 LF-MCG/0.5 IM SUSP: 0.5000 mL | Freq: Once | INTRAMUSCULAR | Status: DC

## 2013-06-05 MED ORDER — FENTANYL 2.5 MCG/ML BUPIVACAINE 1/10 % EPIDURAL INFUSION (WH - ANES)
INTRAMUSCULAR | Status: DC | PRN
  Administered 2013-06-05: 14 mL/h via EPIDURAL

## 2013-06-05 MED ORDER — ONDANSETRON HCL 4 MG/2ML IJ SOLN: 4.0000 mg | INTRAMUSCULAR | Status: DC | PRN

## 2013-06-05 MED ORDER — DIPHENHYDRAMINE HCL 50 MG/ML IJ SOLN: 12.5000 mg | INTRAMUSCULAR | Status: DC | PRN

## 2013-06-05 MED ORDER — FLEET ENEMA 7-19 GM/118ML RE ENEM: 1.0000 | ENEMA | Freq: Every day | RECTAL | Status: DC | PRN

## 2013-06-05 MED ORDER — SENNOSIDES-DOCUSATE SODIUM 8.6-50 MG PO TABS
2.0000 | ORAL_TABLET | Freq: Every day | ORAL | Status: DC
  Administered 2013-06-05: 2 via ORAL

## 2013-06-05 MED ORDER — WITCH HAZEL-GLYCERIN EX PADS: 1.0000 "application " | MEDICATED_PAD | CUTANEOUS | Status: DC | PRN

## 2013-06-05 MED ORDER — DIBUCAINE 1 % RE OINT: 1.0000 "application " | TOPICAL_OINTMENT | RECTAL | Status: DC | PRN

## 2013-06-05 MED ORDER — LIDOCAINE HCL (PF) 1 % IJ SOLN
INTRAMUSCULAR | Status: DC | PRN
  Administered 2013-06-05: 5 mL
  Administered 2013-06-05: 3 mL

## 2013-06-05 MED ORDER — LANOLIN HYDROUS EX OINT: TOPICAL_OINTMENT | CUTANEOUS | Status: DC | PRN

## 2013-06-05 MED ORDER — METHYLERGONOVINE MALEATE 0.2 MG/ML IJ SOLN: 0.2000 mg | INTRAMUSCULAR | Status: DC | PRN

## 2013-06-05 MED ORDER — LACTATED RINGERS IV SOLN
500.0000 mL | Freq: Once | INTRAVENOUS | Status: AC
  Administered 2013-06-05: 500 mL via INTRAVENOUS

## 2013-06-05 MED ORDER — IBUPROFEN 600 MG PO TABS
600.0000 mg | ORAL_TABLET | Freq: Four times a day (QID) | ORAL | Status: DC
  Administered 2013-06-05 – 2013-06-06 (×5): 600 mg via ORAL
  Filled 2013-06-05 (×6): qty 1

## 2013-06-05 MED ORDER — FERROUS SULFATE 325 (65 FE) MG PO TABS
325.0000 mg | ORAL_TABLET | Freq: Two times a day (BID) | ORAL | Status: DC
  Administered 2013-06-06: 325 mg via ORAL
  Filled 2013-06-05: qty 1

## 2013-06-05 MED ORDER — IBUPROFEN 600 MG PO TABS: 600.0000 mg | ORAL_TABLET | Freq: Four times a day (QID) | ORAL | Status: DC

## 2013-06-05 MED ORDER — OXYTOCIN 40 UNITS IN LACTATED RINGERS INFUSION - SIMPLE MED: 62.5000 mL/h | INTRAVENOUS | Status: DC | PRN

## 2013-06-05 MED ORDER — EPHEDRINE 5 MG/ML INJ
10.0000 mg | INTRAVENOUS | Status: DC | PRN
  Filled 2013-06-05: qty 2

## 2013-06-05 MED ORDER — BENZOCAINE-MENTHOL 20-0.5 % EX AERO: 1.0000 "application " | INHALATION_SPRAY | CUTANEOUS | Status: DC | PRN

## 2013-06-05 MED ORDER — METHYLERGONOVINE MALEATE 0.2 MG PO TABS: 0.2000 mg | ORAL_TABLET | ORAL | Status: DC | PRN

## 2013-06-05 MED ORDER — MEASLES, MUMPS & RUBELLA VAC ~~LOC~~ INJ: 0.5000 mL | INJECTION | Freq: Once | SUBCUTANEOUS | Status: DC

## 2013-06-05 MED ORDER — SIMETHICONE 80 MG PO CHEW: 80.0000 mg | CHEWABLE_TABLET | ORAL | Status: DC | PRN

## 2013-06-05 MED ORDER — DIPHENHYDRAMINE HCL 25 MG PO CAPS: 25.0000 mg | ORAL_CAPSULE | Freq: Four times a day (QID) | ORAL | Status: DC | PRN

## 2013-06-05 MED ORDER — BISACODYL 10 MG RE SUPP: 10.0000 mg | Freq: Every day | RECTAL | Status: DC | PRN

## 2013-06-05 MED ORDER — PHENYLEPHRINE 40 MCG/ML (10ML) SYRINGE FOR IV PUSH (FOR BLOOD PRESSURE SUPPORT)
80.0000 ug | PREFILLED_SYRINGE | INTRAVENOUS | Status: DC | PRN
  Filled 2013-06-05: qty 2
  Filled 2013-06-05: qty 5

## 2013-06-05 MED ORDER — EPHEDRINE 5 MG/ML INJ
10.0000 mg | INTRAVENOUS | Status: DC | PRN
  Filled 2013-06-05: qty 2
  Filled 2013-06-05: qty 4

## 2013-06-05 NOTE — Progress Notes (Signed)
   [name] is a 32 y.o. [phone] at [redacted]w[redacted]d  admitted for induction of labor due to Hypertension.  Subjective: Contractions not painful  Objective: BP 132/79  Pulse 85  Temp(Src) 98.7 F (37.1 C) (Oral)  Resp 18  Ht 6\' 1"  (1.854 m)  Wt 147.419 kg (325 lb)  BMI 42.89 kg/m2  LMP 09/11/2012    FHT:  FHR: 150 bpm, variability: moderate,  accelerations:  Present,  decelerations:  Absent UC:   irregular, every 3-5 minutes SVE:   Dilation: 5.5 Effacement (%): 50 Station: Ballotable Exam by:: Drenda Freeze CNM Pitocin @ 20 mu/min AROM using a FSE with fundal pressure.  Large amount clear fluid.  FSE didn't stay attatched.  IUPC placed.  Vtx now applied to cx.  (? Polyp on outer cx os palpated ~ 1/2 com long) Labs: Lab Results  Component Value Date   WBC 9.2 06/03/2013   HGB 11.4* 06/03/2013   HCT 33.4* 06/03/2013   MCV 87.0 06/03/2013   PLT 176 06/03/2013    Assessment / Plan: IOL for worsening CHTN, inadequate response to pitocin Increase pitocin up to 40 mu/min prn Labor: latent Fetal Wellbeing:  Category I Pain Control:  Labor support without medications Anticipated MOD:  NSVD  CRESENZO-DISHMAN,Shonna Deiter 06/05/2013, 12:22 AM

## 2013-06-05 NOTE — Anesthesia Procedure Notes (Signed)
Epidural Patient location during procedure: OB Start time: 06/05/2013 1:30 AM End time: 06/05/2013 1:45 AM  Staffing Anesthesiologist: Lewie Loron R Performed by: anesthesiologist   Preanesthetic Checklist Completed: patient identified, pre-op evaluation, timeout performed, IV checked, risks and benefits discussed and monitors and equipment checked  Epidural Patient position: sitting Prep: site prepped and draped and DuraPrep Patient monitoring: heart rate and continuous pulse ox Approach: midline Injection technique: LOR saline and LOR air  Needle:  Needle type: Tuohy  Needle gauge: 17 G Needle length: 9 cm Needle insertion depth: 8 cm Catheter type: closed end flexible Catheter size: 19 Gauge Catheter at skin depth: 14 cm Test dose: negative  Assessment Sensory level: T8 Events: blood not aspirated, injection not painful, no injection resistance, negative IV test and no paresthesia  Additional Notes Reason for block:procedure for pain

## 2013-06-05 NOTE — Anesthesia Preprocedure Evaluation (Addendum)
Anesthesia Evaluation  Patient identified by MRN, date of birth, ID band Patient awake    Reviewed: Allergy & Precautions, H&P , NPO status , Patient's Chart, lab work & pertinent test results  Airway Mallampati: II TM Distance: >3 FB Neck ROM: Full    Dental   Pulmonary neg pulmonary ROS,          Cardiovascular hypertension, Pt. on medications Rhythm:Regular Rate:Normal     Neuro/Psych PSYCHIATRIC DISORDERS Anxiety negative neurological ROS     GI/Hepatic negative GI ROS, Neg liver ROS, GERD-  ,  Endo/Other  negative endocrine ROSdiabetes, GestationalMorbid obesity  Renal/GU negative Renal ROS     Musculoskeletal negative musculoskeletal ROS (+)   Abdominal (+) + obese,   Peds negative pediatric ROS (+)  Hematology negative hematology ROS (+)   Anesthesia Other Findings   Reproductive/Obstetrics negative OB ROS                          Anesthesia Physical Anesthesia Plan  ASA: III  Anesthesia Plan: Epidural   Post-op Pain Management:    Induction:   Airway Management Planned:   Additional Equipment:   Intra-op Plan:   Post-operative Plan:   Informed Consent: I have reviewed the patients History and Physical, chart, labs and discussed the procedure including the risks, benefits and alternatives for the proposed anesthesia with the patient or authorized representative who has indicated his/her understanding and acceptance.     Plan Discussed with:   Anesthesia Plan Comments:         Anesthesia Quick Evaluation

## 2013-06-05 NOTE — Anesthesia Postprocedure Evaluation (Signed)
  Anesthesia Post-op Note  Patient: [name]  Procedure(s) Performed: * No procedures listed *  Patient Location: Mother/Baby  Anesthesia Type:Epidural  Level of Consciousness: awake  Airway and Oxygen Therapy: Patient Spontanous Breathing  Post-op Pain: none  Post-op Assessment: Patient's Cardiovascular Status Stable, Respiratory Function Stable, Patent Airway, No signs of Nausea or vomiting, Adequate PO intake and Pain level controlled  Post-op Vital Signs: Reviewed and stable  Complications: No apparent anesthesia complications

## 2013-06-05 NOTE — Progress Notes (Signed)
   ALAINNA STAWICKI is a 32 y.o. [phone] at [redacted]w[redacted]d  admitted for induction of labor due to Hypertension.  Subjective: Comfortable with epidural  Objective: BP 133/75  Pulse 79  Temp(Src) 98.7 F (37.1 C) (Oral)  Resp 20  Ht 6\' 1"  (1.854 m)  Wt 147.419 kg (325 lb)  BMI 42.89 kg/m2  SpO2 100%  LMP 09/11/2012    FHT:  FHR: 150 bpm, variability: moderate,  accelerations:  Present,  decelerations:  Absent UC:   MVU's ~ 150-180 SVE:   Dilation: 5.5 Effacement (%): 50 Station: Ballotable Exam by:: Drenda Freeze CNM Pitocin @ 20 mu/min  Labs: Lab Results  Component Value Date   WBC 9.2 06/03/2013   HGB 11.4* 06/03/2013   HCT 33.4* 06/03/2013   MCV 87.0 06/03/2013   PLT 176 06/03/2013    Assessment / Plan: IOL for worsening CHTN, beginning labor Labor: latent Fetal Wellbeing:  Category I Pain Control:  epidural Anticipated MOD:  NSVD  CRESENZO-DISHMAN,Ricki Vanhandel 06/05/2013, 1:57 AM

## 2013-06-06 MED ORDER — IBUPROFEN 200 MG PO TABS: 400.0000 mg | ORAL_TABLET | Freq: Four times a day (QID) | ORAL | Status: DC | PRN

## 2013-06-06 MED ORDER — LABETALOL HCL 100 MG PO TABS: 100.0000 mg | ORAL_TABLET | Freq: Two times a day (BID) | ORAL | Status: DC

## 2013-06-06 NOTE — Discharge Summary (Signed)
Obstetric Discharge Summary Reason for Admission: induction of labor Prenatal Procedures: NST Intrapartum Procedures: spontaneous vaginal delivery Postpartum Procedures: none Complications-Operative and Postpartum: none Hemoglobin  Date Value Range Status  06/03/2013 11.4* 12.0 - 15.0 g/dL Final     HCT  Date Value Range Status  06/03/2013 33.4* 36.0 - 46.0 % Final    Physical Exam:  General: alert, cooperative and appears stated age Lochia: appropriate Uterine Fundus: firm DVT Evaluation: No evidence of DVT seen on physical exam. Negative Homan's sign. No cords or calf tenderness.  Discharge Diagnoses: Term Pregnancy-delivered  Discharge Information: Date: 06/06/2013 Activity: pelvic rest Diet: routine Medications: Ibuprofen Condition: stable Instructions: refer to practice specific booklet Discharge to: home   Newborn Data: Live born female  Birth Weight: 7 lb 8 oz (3402 g) APGAR: 9, 9  Home with mother.  CLARK, MICHAEL L 06/06/2013, 9:21 AM  I have seen and examined this patient and agree with above documentation in the PA student's note. Pt presented as an IOL for cHTN on labetalol.  Pt successfully delivered a liveborn term female with no complications. Her post partum care was uncomplicated. Blood pressures were at goal but she was discharged on her labetalol.   She plans for IUD for contraception and is breast feeding.   Rulon Abide, M.D. [facility] Fellow 06/06/2013 9:36 AM

## 2013-06-07 NOTE — Discharge Summary (Signed)
Attestation of Attending Supervision of Advanced Practitioner (CNM/NP): Evaluation and management procedures were performed by the Advanced Practitioner under my supervision and collaboration.  I have reviewed the Advanced Practitioner's note and chart, and I agree with the management and plan.  Daylyn Azbill 06/07/2013 9:19 AM

## 2013-06-08 ENCOUNTER — Encounter: Payer: BC Managed Care – PPO | Admitting: Obstetrics & Gynecology

## 2013-06-14 ENCOUNTER — Inpatient Hospital Stay (HOSPITAL_COMMUNITY): Admission: RE | Admit: 2013-06-14 | Payer: BC Managed Care – PPO | Source: Ambulatory Visit

## 2013-07-12 ENCOUNTER — Other Ambulatory Visit: Payer: Self-pay | Admitting: Family Medicine

## 2013-07-24 IMAGING — US US OB FOLLOW-UP
1 series · 12 of 28 positions shown · non-contrast
Comparison: none

[Series 1: us ob follow-up · 0.28mm/px · 12 of 42 slices shown]
[im 2/42]
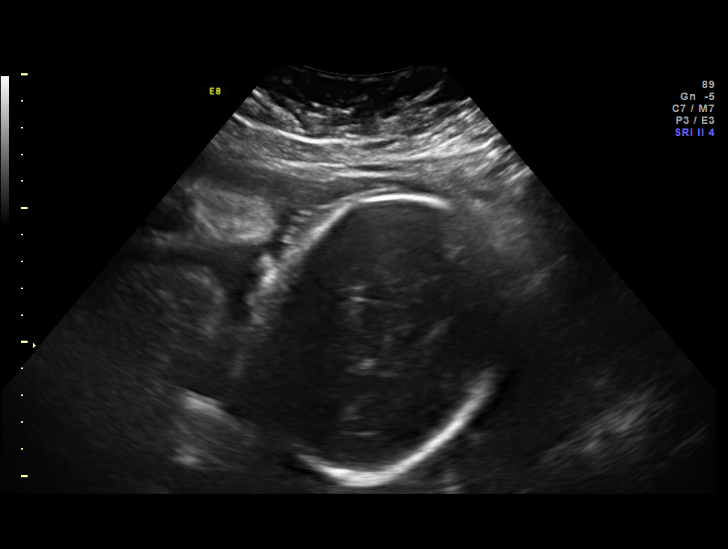
[im 5/42]
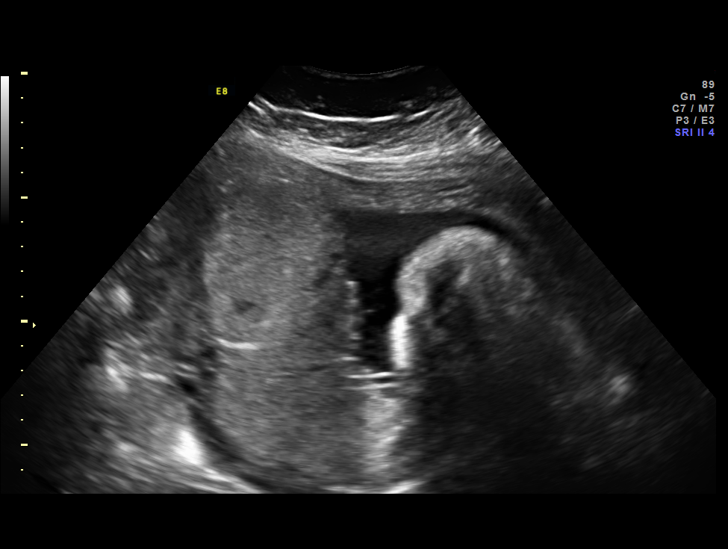
[im 8/42]
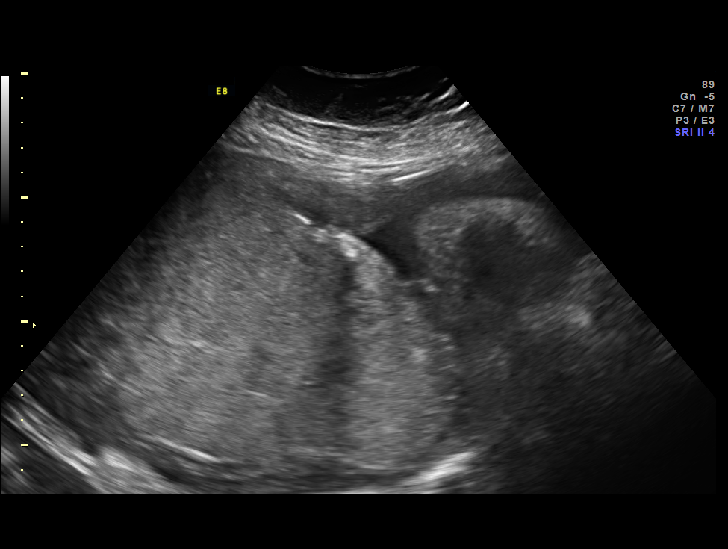
[im 13/42]
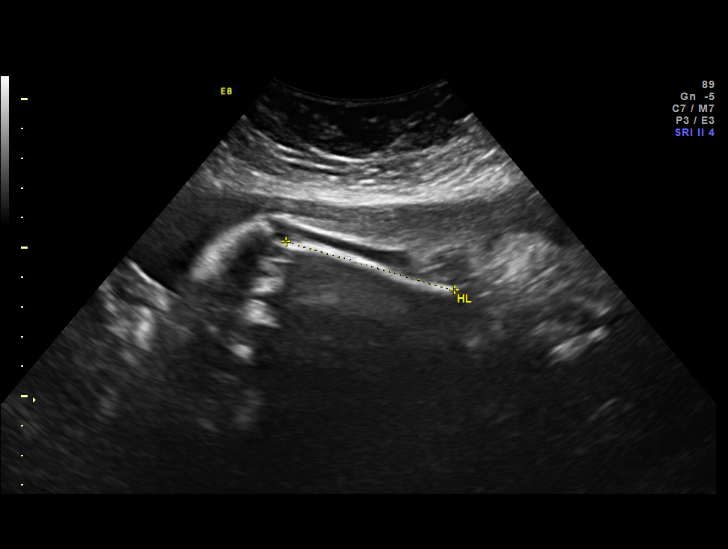
[im 16/42]
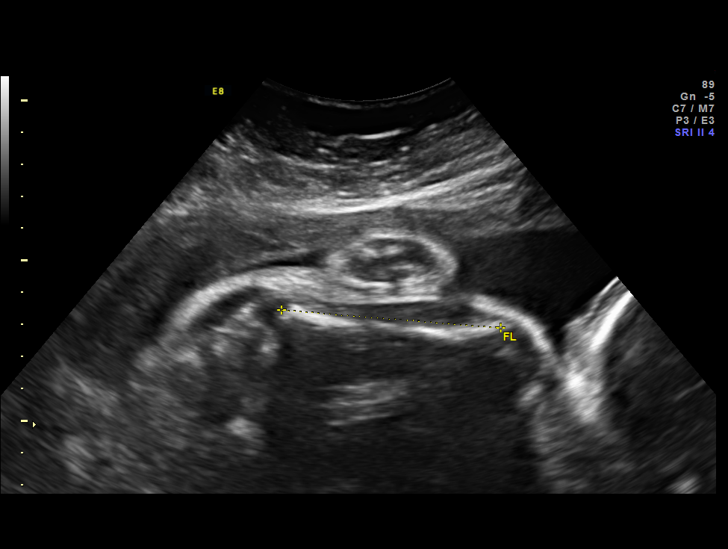
[im 19/42]
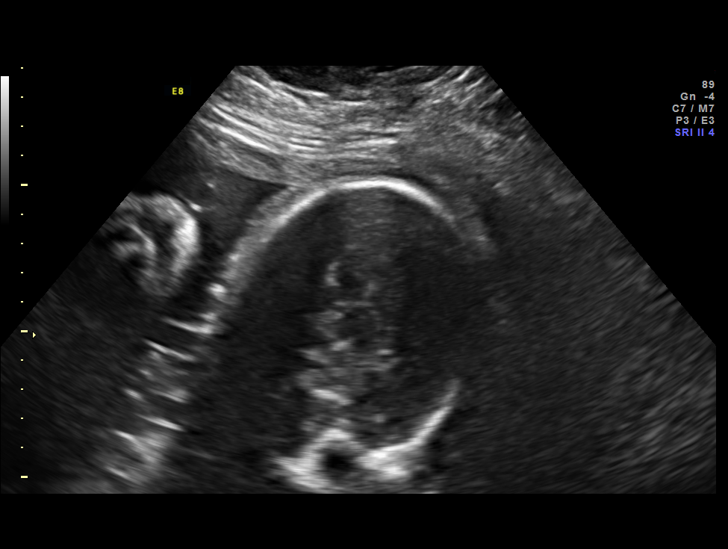
[im 23/42]
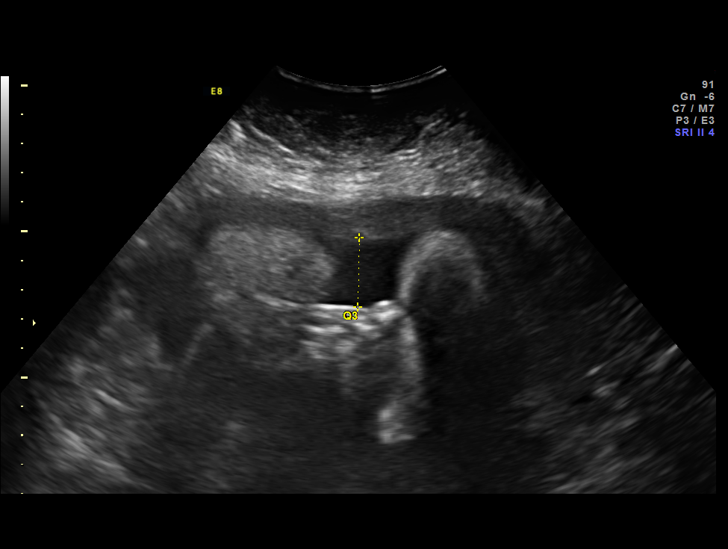
[im 26/42]
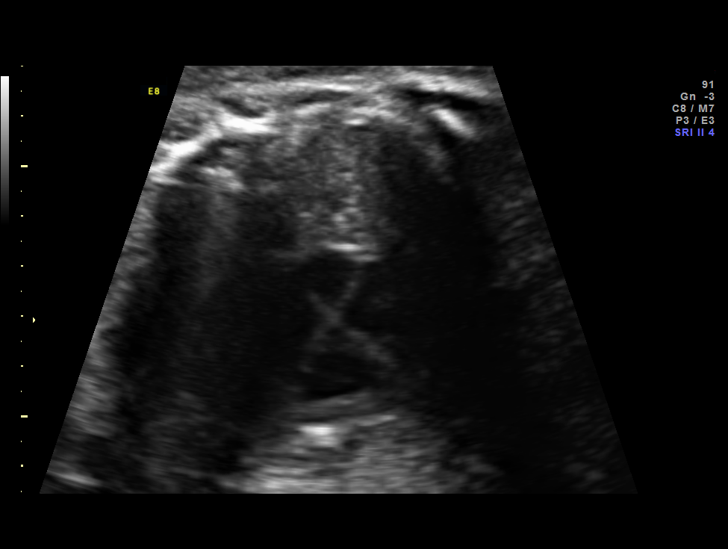
[im 29/42]
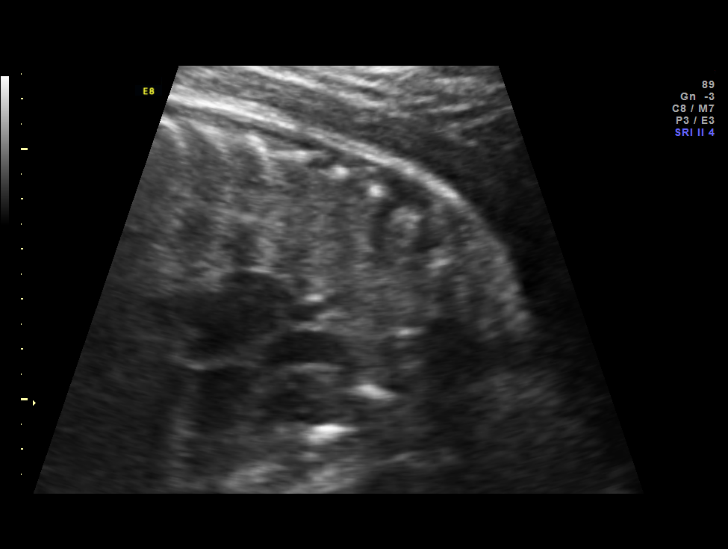
[im 34/42]
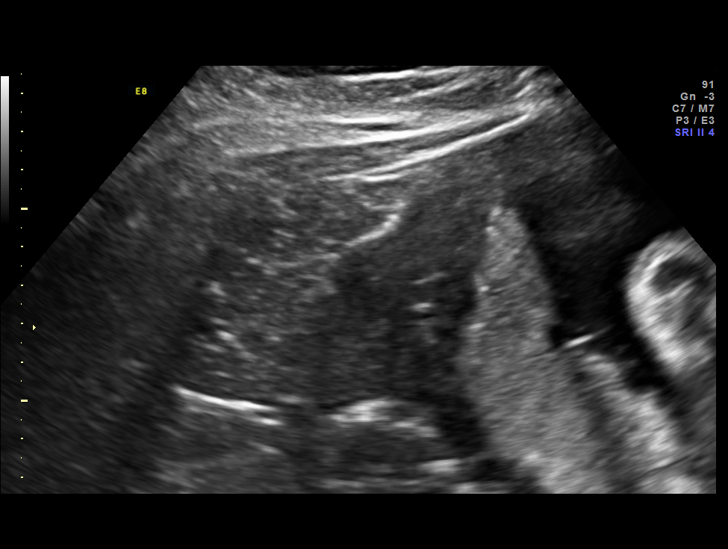
[im 37/42]
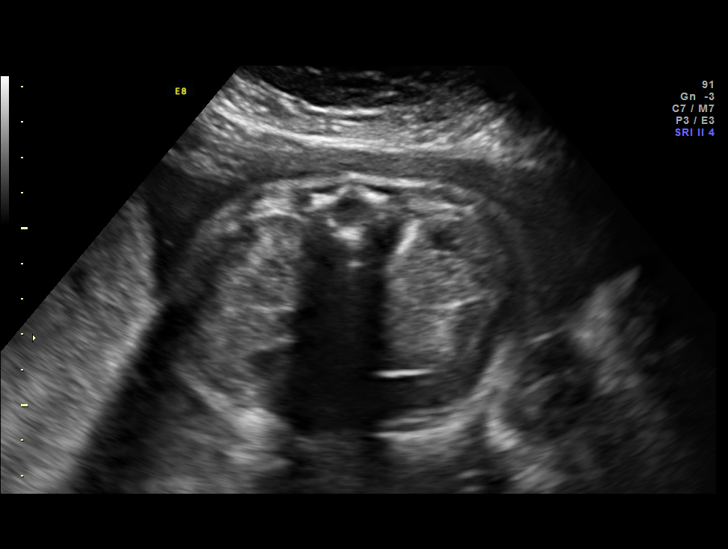
[im 40/42]
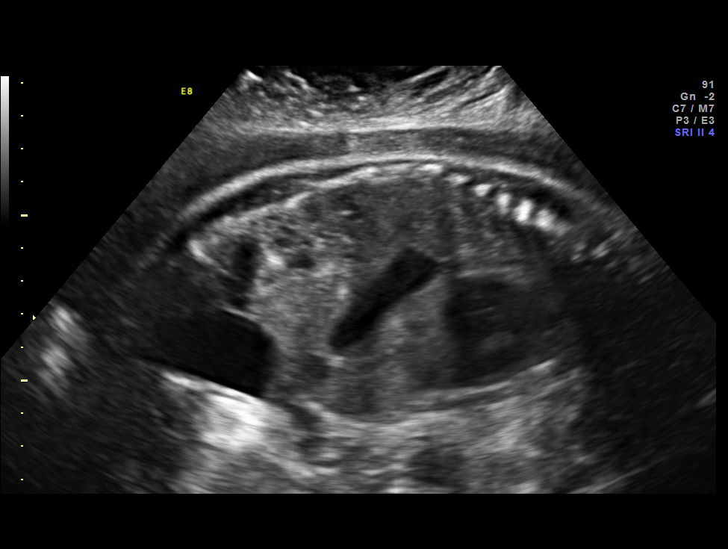

[12 of 28 positions shown; findings below may reference images not displayed]

OBSTETRICS REPORT
                      (Signed Final 05/08/2013 [DATE])

Service(s) Provided

 US OB FOLLOW UP                                       76816.1
Indications

 Previous cesarean section
 Obesity complicating pregnancy                        649.13,
 Poor obstetric history: Previous gestational
 diabetes
 Hypertension - Chronic/Pre-existing
Fetal Evaluation

 Num Of Fetuses:    1
 Fetal Heart Rate:  153                          bpm
 Cardiac Activity:  Observed
 Presentation:      Cephalic
 Placenta:          Posterior, above cervical
                    os

 Amniotic Fluid
 AFI FV:      Subjectively within normal limits
 AFI Sum:     12.97   cm       41  %Tile     Larg Pckt:    4.42  cm
 RUQ:   3.6     cm   RLQ:    4.42   cm    LUQ:   2.58    cm   LLQ:    2.37   cm
Biophysical Evaluation

 Amniotic F.V:   Within normal limits       F. Tone:        Observed
 F. Movement:    Observed                   Score:          [DATE]
 F. Breathing:   Observed
Biometry

 BPD:     84.3  mm     G. Age:  33w 6d                CI:         75.0   70 - 86
 OFD:    112.4  mm                                    FL/HC:      21.2   19.4 -

 HC:     316.3  mm     G. Age:  35w 4d       50  %    HC/AC:      1.03   0.96 -

 AC:     306.5  mm     G. Age:  34w 5d       67  %    FL/BPD:     79.5   71 - 87
 FL:        67  mm     G. Age:  34w 4d       49  %    FL/AC:      21.9   20 - 24
 HUM:     58.8  mm     G. Age:  34w 0d       57  %

 Est. FW:    5832  gm      5 lb 7 oz     69  %
Gestational Age

 LMP:           34w 1d        Date:  09/11/12                 EDD:   06/18/13
 U/S Today:     34w 5d                                        EDD:   06/14/13
 Best:          34w 1d     Det. By:  LMP  (09/11/12)          EDD:   06/18/13
Anatomy

 Cranium:          Appears normal         Aortic Arch:      Previously seen
 Fetal Cavum:      Previously seen        Ductal Arch:      Previously seen
 Ventricles:       Appears normal         Diaphragm:        Appears normal
 Choroid Plexus:   Previously seen        Stomach:          Appears normal, left
                                                            sided
 Cerebellum:       Appears normal         Abdomen:          Appears normal
 Posterior Fossa:  Appears normal         Abdominal Wall:   Previously seen
 Nuchal Fold:      Previously seen        Cord Vessels:     Previously seen
 Face:             Orbits and profile     Kidneys:          Appear normal
                   previously seen
 Lips:             Previously seen        Bladder:          Appears normal
 Heart:            Appears normal         Spine:            Previously seen
                   (4CH, axis, and
                   situs)
 RVOT:             Not well visualized    Lower             Previously seen
                                          Extremities:
 LVOT:             Appears normal         Upper             Previously seen
                                          Extremities:

 Other:  Female gender. Heels visualized. Technically difficult due to maternal
         habitus and fetal position.
Cervix Uterus Adnexa

 Left Ovary:    Not visualized. `
 Right Ovary:   Within normal limits.
Impression

 IUP at 34+1 weeks
 Normal interval anatomy; anatomic survey complete
 Normal amniotic fluid volume
 Appropriate interval growth with EFW at the 69th %tile
 BPP [DATE]

 BP 141/79; patient not taking labetalol
Recommendations

 Continue twice weekly NSTs with weekly AFIs

 questions or concerns.

## 2013-07-26 ENCOUNTER — Ambulatory Visit: Payer: BC Managed Care – PPO | Admitting: Family Medicine

## 2013-08-04 ENCOUNTER — Ambulatory Visit: Payer: BC Managed Care – PPO | Admitting: Family Medicine

## 2013-08-15 ENCOUNTER — Ambulatory Visit (INDEPENDENT_AMBULATORY_CARE_PROVIDER_SITE_OTHER): Payer: BC Managed Care – PPO | Admitting: Family Medicine

## 2013-08-15 ENCOUNTER — Encounter: Payer: Self-pay | Admitting: Family Medicine

## 2013-08-15 MED ORDER — DIAPHRAGM ARC-SPRING 75 MM VA DPRH: 1.0000 [IU] | VAGINAL_INSERT | VAGINAL | Status: DC | PRN

## 2013-08-15 NOTE — Progress Notes (Signed)
  Subjective:     [name] is a 32 y.o. female who presents for a postpartum visit. She is 8 weeks postpartum following a spontaneous vaginal delivery. I have fully reviewed the prenatal and intrapartum course. The delivery was at 38 gestational weeks. Outcome: vaginal birth after cesarean (VBAC). Anesthesia: epidural. Postpartum course has been notable for anxiety/tension. Baby's course has been unremarkable. Baby is feeding by bottle - similac. Bleeding no bleeding. Bowel function is normal. Bladder function is normal. Patient is sexually active. Contraception method is none. Postpartum depression screening: positive.  The following portions of the patient's history were reviewed and updated as appropriate: allergies, current medications, past family history, past medical history, past social history, past surgical history and problem list.  Review of Systems A comprehensive review of systems was negative.   Objective:    BP 133/98  Pulse 73  Ht 6\' 1"  (1.854 m)  Wt 295 lb (133.811 kg)  BMI 38.93 kg/m2  LMP 08/07/2013  Breastfeeding? No  General:  alert, cooperative and appears stated age   Breasts:  inspection negative, no nipple discharge or bleeding, no masses or nodularity palpable  Lungs: clear to auscultation bilaterally  Heart:  regular rate and rhythm, S1, S2 normal, no murmur, click, rub or gallop  Abdomen: soft, non-tender; bowel sounds normal; no masses,  no organomegaly   Vulva:  normal  Vagina: normal vagina, no discharge, exudate, lesion, or erythema  Cervix:  no cervical motion tenderness  Corpus: normal size, contour, position, consistency, mobility, non-tender  Adnexa:  normal adnexa        Assessment:     Nml postpartum exam. Pap smear not done at today's visit.   Plan:    1. Contraception: diaphragm 2. Referral to psych 3. Follow up in: several months or as needed.

## 2013-08-15 NOTE — Patient Instructions (Addendum)
Anxiety and Panic Attacks Your caregiver has informed you that you are having an anxiety or panic attack. There may be many forms of this. Most of the time these attacks come suddenly and without warning. They come at any time of day, including periods of sleep, and at any time of life. They may be strong and unexplained. Although panic attacks are very scary, they are physically harmless. Sometimes the cause of your anxiety is not known. Anxiety is a protective mechanism of the body in its fight or flight mechanism. Most of these perceived danger situations are actually nonphysical situations (such as anxiety over losing a job). CAUSES  The causes of an anxiety or panic attack are many. Panic attacks may occur in otherwise healthy people given a certain set of circumstances. There may be a genetic cause for panic attacks. Some medications may also have anxiety as a side effect. SYMPTOMS  Some of the most common feelings are:  Intense terror.  Dizziness, feeling faint.  Hot and cold flashes.  Fear of going crazy.  Feelings that nothing is real.  Sweating.  Shaking.  Chest pain or a fast heartbeat (palpitations).  Smothering, choking sensations.  Feelings of impending doom and that death is near.  Tingling of extremities, this may be from over-breathing.  Altered reality (derealization).  Being detached from yourself (depersonalization). Several symptoms can be present to make up anxiety or panic attacks. DIAGNOSIS  The evaluation by your caregiver will depend on the type of symptoms you are experiencing. The diagnosis of anxiety or panic attack is made when no physical illness can be determined to be a cause of the symptoms. TREATMENT  Treatment to prevent anxiety and panic attacks may include:  Avoidance of circumstances that cause anxiety.  Reassurance and relaxation.  Regular exercise.  Relaxation therapies, such as yoga.  Psychotherapy with a psychiatrist or  therapist.  Avoidance of caffeine, alcohol and illegal drugs.  Prescribed medication. SEEK IMMEDIATE MEDICAL CARE IF:   You experience panic attack symptoms that are different than your usual symptoms.  You have any worsening or concerning symptoms. Document Released: 10/05/2005 Document Revised: 12/28/2011 Document Reviewed: 02/06/2010 [facility] Patient Information 2014 [place], [place]. Diaphragm A diaphragmis a soft, latex, dome-shaped barrier that is placed in the vagina with spermicidal jelly before sexual intercourse. It covers the cervix, kills sperm, and blocks the passage of sperm into the cervix. This method does not protect against sexually transmitted diseases (STDs). A diaphragm must be fitted by a caregiver during a pelvic exam. A caregiver will measure your vagina prior to prescribing a diaphragm. You will also learn about use, care, and problems of a diaphragm during the exam. If you have significant weight changes or become pregnant, it is important to have the diaphragm rechecked and possibly refitted. The diaphragm should be replaced every 2 years, or sooner if damaged. ADVANTAGES  You can use it while breastfeeding.  It is not felt by your sex partner.  It does not interfere with your female hormones.  It works immediately and is not permanent. DISADVANTAGES  It is sometimes difficult to insert.  It may shift out of place during sexual intercourse.  You must have it fitted and refitted by your caregiver. HOW TO INSERT A DIAPHRAGM 1. Check the diaphragm for holes by holding it up to the light, stretching the latex, or by filling it with water. 2. Place the spermicide cream or jelly inside the dome and around the rim of the diaphragm. 3. Squeeze the  rim of the diaphragm and insert the diaphragm into the vagina. 4. The opening of the dome should face the cervix while inserting the diaphragm. 5. The front part (or top) of the rim should be behind the pubic bone and  pushed over the top of the cervix. 6. Be sure the cervix is completely covered by reaching into your vagina and feeling the cervix behind the latex dome of the diaphragm. If you are uncomfortable, it is not inserted properly. Try inserting it again. 7. Leave the diaphragm in for 6 to 8 hours after intercourse. If intercourse occurs again within these 6 hours, another application of spermicide is needed. 8. The diaphragm should not be left in place for longer than 24 hours. HOME CARE INSTRUCTIONS   Wash the diaphragm with mild soap and warm water. Rinse thoroughly and dry completely after every use.  Only use water-based lubricants with the diaphragm. Oil-based lubricants can damage the diaphragm.  Do not use talc on the diaphragm.  Do not use the diaphragm if:  You had a baby in the last 2 months.  You have a vaginal infection.  You are having a menstrual period.  You had recent surgery on your cervix or vagina.  You have vaginal bleeding of unknown cause.  Your sex partner is allergic to latex or spermicides. SEEK MEDICAL CARE IF:   You have pain during sexual intercourse when using the diaphragm.  The diaphragm slips out of place during sexual intercourse.  You have a fever.  You have blood in your urine.  You have burning or pain when you urinate.  You find a hole in the diaphragm.  You develop abnormal vaginal discharge.  You have vaginal itching or irritation.  You cannot remove the diaphragm.  You think you may be pregnant.  You need to be refitted for a diaphragm. Document Released: 12/26/2002 Document Revised: 12/28/2011 Document Reviewed: 02/18/2011 [facility] Patient Information 2014 [place], [place].

## 2013-11-03 ENCOUNTER — Telehealth: Payer: Self-pay | Admitting: *Deleted

## 2013-11-03 NOTE — Telephone Encounter (Signed)
Pt called because she had a positive UPT at home at the end of December 2014.  She started bleeding 4 days ago, 1-2 days heavy with clots and now bleeding like a light period. Pt also complains of not feeling well.  I told pt she needed to go to [facility] to be evaluated for possible miscarriage.  Pt said she is sure that is what is happening but she didn't want to go to [facility].  She said her son had an important doctors appointment today that she was not going to miss.  She said if she felt bad enough like she needed to go to [facility] she would.  For now she said she was going to "suck it up".  She said if she didn't go to [facility] she would let it would its course and come in for an appointment next week.  Again I did tell patient she needed to go to [facility] to be evaluated today but patient is refusing at this time.

## 2013-11-08 ENCOUNTER — Ambulatory Visit (INDEPENDENT_AMBULATORY_CARE_PROVIDER_SITE_OTHER): Payer: BC Managed Care – PPO | Admitting: Family Medicine

## 2013-11-08 ENCOUNTER — Encounter: Payer: Self-pay | Admitting: Family Medicine

## 2013-11-08 ENCOUNTER — Encounter: Payer: BC Managed Care – PPO | Admitting: Family Medicine

## 2013-11-08 VITALS — BP 128/98 | HR 91 | Ht 73.0 in | Wt 300.0 lb

## 2013-11-08 DIAGNOSIS — O039 Complete or unspecified spontaneous abortion without complication: Secondary | ICD-10-CM | POA: Insufficient documentation

## 2013-11-08 DIAGNOSIS — N912 Amenorrhea, unspecified: Secondary | ICD-10-CM

## 2013-11-08 LAB — POCT URINE PREGNANCY: Preg Test, Ur: NEGATIVE

## 2013-11-08 NOTE — Assessment & Plan Note (Signed)
Probable and early--will check BHCG--if neg. No further f/u needed.  If positive, repeat in 2 days, may require some imaging.

## 2013-11-08 NOTE — Patient Instructions (Signed)
Miscarriage A miscarriage is the sudden loss of an unborn baby (fetus) before the 20th week of pregnancy. Most miscarriages happen in the first 3 months of pregnancy. Sometimes, it happens before a woman even knows she is pregnant. A miscarriage is also called a "spontaneous miscarriage" or "early pregnancy loss." Having a miscarriage can be an emotional experience. Talk with your caregiver about any questions you may have about miscarrying, the grieving process, and your future pregnancy plans. CAUSES   Problems with the fetal chromosomes that make it impossible for the baby to develop normally. Problems with the baby's genes or chromosomes are most often the result of errors that occur, by chance, as the embryo divides and grows. The problems are not inherited from the parents.  Infection of the cervix or uterus.   Hormone problems.   Problems with the cervix, such as having an incompetent cervix. This is when the tissue in the cervix is not strong enough to hold the pregnancy.   Problems with the uterus, such as an abnormally shaped uterus, uterine fibroids, or congenital abnormalities.   Certain medical conditions.   Smoking, drinking alcohol, or taking illegal drugs.   Trauma.  Often, the cause of a miscarriage is unknown.  SYMPTOMS   Vaginal bleeding or spotting, with or without cramps or pain.  Pain or cramping in the abdomen or lower back.  Passing fluid, tissue, or blood clots from the vagina. DIAGNOSIS  Your caregiver will perform a physical exam. You may also have an ultrasound to confirm the miscarriage. Blood or urine tests may also be ordered. TREATMENT   Sometimes, treatment is not necessary if you naturally pass all the fetal tissue that was in the uterus. If some of the fetus or placenta remains in the body (incomplete miscarriage), tissue left behind may become infected and must be removed. Usually, a dilation and curettage (D and C) procedure is performed.  During a D and C procedure, the cervix is widened (dilated) and any remaining fetal or placental tissue is gently removed from the uterus.  Antibiotic medicines are prescribed if there is an infection. Other medicines may be given to reduce the size of the uterus (contract) if there is a lot of bleeding.  If you have Rh negative blood and your baby was Rh positive, you will need a Rh immunoglobulin shot. This shot will protect any future baby from having Rh blood problems in future pregnancies. HOME CARE INSTRUCTIONS   Your caregiver may order bed rest or may allow you to continue light activity. Resume activity as directed by your caregiver.  Have someone help with home and family responsibilities during this time.   Keep track of the number of sanitary pads you use each day and how soaked (saturated) they are. Write down this information.   Do not use tampons. Do not douche or have sexual intercourse until approved by your caregiver.   Only take over-the-counter or prescription medicines for pain or discomfort as directed by your caregiver.   Do not take aspirin. Aspirin can cause bleeding.   Keep all follow-up appointments with your caregiver.   If you or your partner have problems with grieving, talk to your caregiver or seek counseling to help cope with the pregnancy loss. Allow enough time to grieve before trying to get pregnant again.  SEEK IMMEDIATE MEDICAL CARE IF:   You have severe cramps or pain in your back or abdomen.  You have a fever.  You pass large blood clots (walnut-sized   or larger) ortissue from your vagina. Save any tissue for your caregiver to inspect.   Your bleeding increases.   You have a thick, bad-smelling vaginal discharge.  You become lightheaded, weak, or you faint.   You have chills.  MAKE SURE YOU:  Understand these instructions.  Will watch your condition.  Will get help right away if you are not doing well or get  worse. Document Released: 03/31/2001 Document Revised: 01/30/2013 Document Reviewed: 11/24/2011 [employer] Patient Information 2014 [employer].  

## 2013-11-08 NOTE — Progress Notes (Signed)
   Subjective:    Patient ID: [name], female    DOB: [birth year], 33 y.o.   MRN: [id number]  HPI LMP 08/26/13.  Negative UPT 12/24.  Post christmas, was positive. Scheduled for New[facility] today, however, began bleeding about a week ago with some passing clots, cramping and then spotting.  No further bleeding.  Neg UPT here today.   Review of Systems  Constitutional: Negative for fever and chills.  Gastrointestinal: Negative for nausea and vomiting.  Genitourinary: Negative for dysuria.  Musculoskeletal: Negative for back pain.  Skin: Negative for rash.       Objective:   Physical Exam  Vitals reviewed. Constitutional: She appears well-developed and well-nourished. No distress.  HENT:  Head: Normocephalic and atraumatic.  Neck: Neck supple.  Cardiovascular: Normal rate.   Pulmonary/Chest: Effort normal.  Abdominal: Soft. There is no tenderness.  Skin: Skin is warm and dry.  Psychiatric: She has a normal mood and affect.          Assessment & Plan:

## 2013-11-09 LAB — HCG, QUANTITATIVE, PREGNANCY: hCG, Beta Chain, Quant, S: 7.5 m[IU]/mL

## 2013-11-09 NOTE — Progress Notes (Signed)
Notified patient of results, she will come in tomorrow to have blood drawn.

## 2013-11-10 ENCOUNTER — Other Ambulatory Visit: Payer: BC Managed Care – PPO

## 2013-11-28 ENCOUNTER — Ambulatory Visit: Payer: BC Managed Care – PPO | Admitting: Family Medicine

## 2014-01-11 NOTE — Telephone Encounter (Signed)
Error

## 2014-03-07 NOTE — Telephone Encounter (Signed)
ERROR NOTE

## 2014-05-02 ENCOUNTER — Ambulatory Visit (INDEPENDENT_AMBULATORY_CARE_PROVIDER_SITE_OTHER): Payer: BC Managed Care – PPO | Admitting: Family Medicine

## 2014-05-02 ENCOUNTER — Encounter: Payer: Self-pay | Admitting: Family Medicine

## 2014-05-02 VITALS — BP 157/91 | HR 86 | Ht 73.0 in | Wt 310.2 lb

## 2014-05-02 DIAGNOSIS — N920 Excessive and frequent menstruation with regular cycle: Secondary | ICD-10-CM | POA: Insufficient documentation

## 2014-05-02 LAB — CBC
HCT: 39.3 % (ref 36.0–46.0)
Hemoglobin: 13.5 g/dL (ref 12.0–15.0)
MCH: 29 pg (ref 26.0–34.0)
MCHC: 34.4 g/dL (ref 30.0–36.0)
MCV: 84.5 fL (ref 78.0–100.0)
Platelets: 248 10*3/uL (ref 150–400)
RBC: 4.65 MIL/uL (ref 3.87–5.11)
RDW: 13.5 % (ref 11.5–15.5)
WBC: 7.3 10*3/uL (ref 4.0–10.5)

## 2014-05-02 LAB — POCT URINE PREGNANCY: Preg Test, Ur: NEGATIVE

## 2014-05-02 NOTE — Progress Notes (Signed)
Patients period has been irregular since she had her miscarriage in January.  Her periods are lasting longer and they are a lot heavier.  The cramping is not worse but her cycles usually last 3 days and then they are over.

## 2014-05-02 NOTE — Progress Notes (Signed)
    Subjective:    Patient ID: Octavia BrucknerJennifer L Tyson is a 33 y.o. female presenting with irregular cycles  on 05/02/2014  HPI: Previously normal cycles, monthly, with 1 heavy day. Lasted 3 days.  Since miscarriage, cycles remain monthly, but lasting 7 days, and bleeding is much heavier with clots.3-4 heavy days. Had lost 15 pounds but has gained it back. Feels tired and fatigued.  Review of Systems  Constitutional: Positive for fatigue. Negative for fever.  Respiratory: Negative for shortness of breath.   Cardiovascular: Negative for chest pain.  Gastrointestinal: Negative for abdominal pain.  Genitourinary: Negative for dysuria.      Objective:    BP 157/91  Pulse 86  Ht 6\' 1"  (1.854 m)  Wt 310 lb 3.2 oz (140.706 kg)  BMI 40.93 kg/m2 Physical Exam  Constitutional: She appears well-developed and well-nourished. No distress.  Cardiovascular: Normal rate.   Pulmonary/Chest: Effort normal.  Abdominal: Soft. There is no tenderness.    UPT negative    Assessment & Plan:   Problem List Items Addressed This Visit     Unprioritized   Heavy menstrual bleeding - Primary     Unclear etiology--will check labs and pelvic sono.    Relevant Orders      CBC      TSH      US Pelvis Complete      US Transvaginal Non-OB      hCG, quantitative, pregnancy      POCT urine pregnancy (Completed)       Return in about 4 weeks (around 05/30/2014).

## 2014-05-02 NOTE — Patient Instructions (Signed)

## 2014-05-02 NOTE — Assessment & Plan Note (Signed)
Unclear etiology--will check labs and pelvic sono.

## 2014-05-03 LAB — TSH: TSH: 0.883 u[IU]/mL (ref 0.350–4.500)

## 2014-05-03 LAB — HCG, QUANTITATIVE, PREGNANCY: hCG, Beta Chain, Quant, S: <2 m[IU]/mL

## 2014-05-18 ENCOUNTER — Telehealth: Payer: Self-pay | Admitting: *Deleted

## 2014-05-18 NOTE — Telephone Encounter (Signed)
Message copied by Grayland OrmondHINTON, Blase Beckner C on Fri May 18, 2014 11:19 AM ------      Message from: Reva BoresPRATT, TANYA S      Created: Thu May 03, 2014 11:58 AM       normal labs please inform pt. ------

## 2014-05-18 NOTE — Telephone Encounter (Signed)
Pt aware all normal .  

## 2014-08-01 LAB — HEPATIC FUNCTION PANEL
ALT: 20 U/L (ref 7–35)
AST: 16 U/L (ref 13–35)

## 2014-08-01 LAB — BASIC METABOLIC PANEL
BUN: 11 mg/dL (ref 4–21)
Creatinine: 0.8 mg/dL (ref 0.5–1.1)
Glucose: 101 mg/dL
Potassium: 4.3 mmol/L (ref 3.4–5.3)
Sodium: 138 mmol/L (ref 137–147)

## 2014-08-01 LAB — CBC AND DIFFERENTIAL
HCT: 40 % (ref 36–46)
Hemoglobin: 13.7 g/dL (ref 12.0–16.0)
Platelets: 258 10*3/uL (ref 150–399)
WBC: 7.2 10^3/mL

## 2014-08-01 LAB — TSH: TSH: 0.79 u[IU]/mL (ref 0.41–5.90)

## 2014-08-20 ENCOUNTER — Encounter: Payer: Self-pay | Admitting: Family Medicine

## 2014-10-19 NOTE — L&D Delivery Note (Cosign Needed)
Delivery Note  After a 15 minute 2nd stage, At 5:06 AM a viable female was delivered via Vaginal, Spontaneous Delivery (Presentation: Right Occiput Anterior).  APGAR: , 8/9; weight  pending. After 3 m inutes, the cord was clampled and cut 40 units of pitocin diluted in 1000cc LR was infused rapidly IV.  The placenta separated spontaneously and delivered via CCT and maternal pushing effort.  It was inspected and appears to be intact with a 3 VC.   Anesthesia: Epidural  Episiotomy: None Lacerations: None Suture Repair:  Est. Blood Loss (mL):  200  Mom to postpartum.  Baby to Couplet care / Skin to Skin.  Brown,Jackie Dier 06/14/2015, 5:25 AM

## 2014-11-27 ENCOUNTER — Ambulatory Visit (INDEPENDENT_AMBULATORY_CARE_PROVIDER_SITE_OTHER): Payer: Self-pay | Admitting: Family Medicine

## 2014-11-27 ENCOUNTER — Encounter: Payer: Self-pay | Admitting: Family Medicine

## 2014-11-27 VITALS — BP 128/91 | HR 105 | Wt 316.4 lb

## 2014-11-27 DIAGNOSIS — O34219 Maternal care for unspecified type scar from previous cesarean delivery: Secondary | ICD-10-CM

## 2014-11-27 DIAGNOSIS — O3421 Maternal care for scar from previous cesarean delivery: Secondary | ICD-10-CM

## 2014-11-27 DIAGNOSIS — Z98891 History of uterine scar from previous surgery: Secondary | ICD-10-CM | POA: Insufficient documentation

## 2014-11-27 DIAGNOSIS — Z3490 Encounter for supervision of normal pregnancy, unspecified, unspecified trimester: Secondary | ICD-10-CM | POA: Insufficient documentation

## 2014-11-27 DIAGNOSIS — I10 Essential (primary) hypertension: Secondary | ICD-10-CM

## 2014-11-27 DIAGNOSIS — R5382 Chronic fatigue, unspecified: Secondary | ICD-10-CM

## 2014-11-27 DIAGNOSIS — O09521 Supervision of elderly multigravida, first trimester: Secondary | ICD-10-CM

## 2014-11-27 DIAGNOSIS — Z3491 Encounter for supervision of normal pregnancy, unspecified, first trimester: Secondary | ICD-10-CM

## 2014-11-27 DIAGNOSIS — O09529 Supervision of elderly multigravida, unspecified trimester: Secondary | ICD-10-CM | POA: Insufficient documentation

## 2014-11-27 DIAGNOSIS — Z3493 Encounter for supervision of normal pregnancy, unspecified, third trimester: Secondary | ICD-10-CM

## 2014-11-27 NOTE — Progress Notes (Signed)
Over the past 8-12 months patient is having significant increase in fatigue, she has had blood work done here and at her primary care doctor and everything seems to check out.  He did start her on Lexapro for anxiety to see if that would help, the medication did help and the "choking" feeling is not coming back.  Bedside ultrasound shows positive fetal heart rate and CRL measuring 10 weeks at bedside ultrasound.

## 2014-11-27 NOTE — Progress Notes (Signed)
Subjective:    [name] is a [id number] [redacted]w[redacted]d being seen today for her first obstetrical visit.  Her obstetrical history is significant for advanced maternal age, obesity and previous C-section x 1 with subsequent VBAC x 3, paroxysymal HTN. Patient does intend to breast feed. Pregnancy history fully reviewed.  Patient reports fatigue.  Filed Vitals:   11/27/14 1339  BP: 128/91  Pulse: 105  Weight: 316 lb 6.4 oz (143.518 kg)    HISTORY: OB History  Gravida Para Term Preterm AB SAB TAB Ectopic Multiple Living  # Outcome Date GA Lbr Len/2nd Weight Sex Delivery Anes PTL Lv  7 Current           6 SAB 2015          5 Term 06/05/13 [redacted]w[redacted]d 03:16 / 00:21 7 lb 8 oz (3.402 kg) F VBAC EPI  Y  4 Term 2010 103w0d   F VBAC   Y     Comments: GHTN  3 SAB 2010          2 Term 2008 [redacted]w[redacted]d   M VBAC EPI  Y     Comments: GHTN  1 Preterm 2006 [redacted]w[redacted]d   M CS-LTranv Spinal  Y     Comments: pre eclampsia     Past Medical History  Diagnosis Date  . Anxiety   . Pregnancy induced hypertension     labetolol  . Kidney stones 2013    13 passed  . Gestational diabetes 2008 and 2010  . Preterm labor     delivery   Past Surgical History  Procedure Laterality Date  . Cesarean section  2006  . Wisdom tooth extraction      x4   Family History  Problem Relation Age of Onset  . Schizophrenia Maternal Grandmother   . Atrial fibrillation Mother   . Depression Mother   . Anxiety disorder Mother      Exam     Skin: normal coloration and turgor, no rashes    Neurologic: oriented   Extremities: normal strength, tone, and muscle mass   HEENT sclera clear, anicteric   Mouth/Teeth mucous membranes moist, pharynx normal without lesions   Neck supple   Cardiovascular: regular rate and rhythm, no murmurs or gallops   Respiratory:  appears well, vitals normal, no respiratory distress, acyanotic, normal RR, ear and throat exam is normal, neck free of mass or lymphadenopathy,  chest clear, no wheezing, crepitations, rhonchi, normal symmetric air entry   Abdomen: soft, non-tender; bowel sounds normal; no masses,  no organomegaly      Assessment:    Pregnancy: [id number] Patient Active Problem List   Diagnosis Date Noted  . Supervision of normal pregnancy 11/27/2014    Priority: High  . AMA (advanced maternal age) multigravida 35+ 11/27/2014    Priority: Medium  . Paroxysmal hypertension 04/28/2013    Priority: Medium  . Chronic fatigue 11/27/2014  . Nephrolithiasis 11/29/2012  . Hydronephrosis, right 11/08/2012  . Anxiety   . Obesity, unspecified 12/23/2007  . GERD 12/23/2007  . GALLSTONES 12/23/2007  . Acquired Cyst of Kidney 12/23/2007        Plan:     Initial labs drawn. Prenatal vitamins. Problem list reviewed and updated. Genetic Screening discussed First Screen: declined. Ultrasound discussed; fetal survey: discussed. Problem List Items Addressed This Visit      High   Supervision of normal pregnancy   Relevant Orders  Culture, OB Urine   Prenatal Profile   Comprehensive metabolic panel     Medium   Paroxysmal hypertension   Relevant Orders   Comprehensive metabolic panel   AMA (advanced maternal age) multigravida 35+   Previous cesarean delivery affecting pregnancy, antepartum     Unprioritized   RESOLVED: Supervision of normal pregnancy - Primary   Relevant Orders   Culture, OB Urine   Prenatal Profile   Comprehensive metabolic panel   Chronic fatigue   Relevant Orders   Vitamin D (25 hydroxy)   Comprehensive metabolic panel      Follow up in 4 weeks.   Zalia Hautala S 11/27/2014

## 2014-11-27 NOTE — Patient Instructions (Addendum)
First Trimester of Pregnancy The first trimester of pregnancy is from week 1 until the end of week 12 (months 1 through 3). A week after a sperm fertilizes an egg, the egg will implant on the wall of the uterus. This embryo will begin to develop into a baby. Genes from you and your partner are forming the baby. The female genes determine whether the baby is a boy or a girl. At 6-8 weeks, the eyes and face are formed, and the heartbeat can be seen on ultrasound. At the end of 12 weeks, all the baby's organs are formed.  Now that you are pregnant, you will want to do everything you can to have a healthy baby. Two of the most important things are to get good prenatal care and to follow your health care provider's instructions. Prenatal care is all the medical care you receive before the baby's birth. This care will help prevent, find, and treat any problems during the pregnancy and childbirth. BODY CHANGES Your body goes through many changes during pregnancy. The changes vary from woman to woman.   You may gain or lose a couple of pounds at first.  You may feel sick to your stomach (nauseous) and throw up (vomit). If the vomiting is uncontrollable, call your health care provider.  You may tire easily.  You may develop headaches that can be relieved by medicines approved by your health care provider.  You may urinate more often. Painful urination may mean you have a bladder infection.  You may develop heartburn as a result of your pregnancy.  You may develop constipation because certain hormones are causing the muscles that push waste through your intestines to slow down.  You may develop hemorrhoids or swollen, bulging veins (varicose veins).  Your breasts may begin to grow larger and become tender. Your nipples may stick out more, and the tissue that surrounds them (areola) may become darker.  Your gums may bleed and may be sensitive to brushing and flossing.  Dark spots or blotches  (chloasma, mask of pregnancy) may develop on your face. This will likely fade after the baby is born.  Your menstrual periods will stop.  You may have a loss of appetite.  You may develop cravings for certain kinds of food.  You may have changes in your emotions from day to day, such as being excited to be pregnant or being concerned that something may go wrong with the pregnancy and baby.  You may have more vivid and strange dreams.  You may have changes in your hair. These can include thickening of your hair, rapid growth, and changes in texture. Some women also have hair loss during or after pregnancy, or hair that feels dry or thin. Your hair will most likely return to normal after your baby is born. WHAT TO EXPECT AT YOUR PRENATAL VISITS During a routine prenatal visit:  You will be weighed to make sure you and the baby are growing normally.  Your blood pressure will be taken.  Your abdomen will be measured to track your baby's growth.  The fetal heartbeat will be listened to starting around week 10 or 12 of your pregnancy.  Test results from any previous visits will be discussed. Your health care provider may ask you:  How you are feeling.  If you are feeling the baby move.  If you have had any abnormal symptoms, such as leaking fluid, bleeding, severe headaches, or abdominal cramping.  If you have any questions. Other tests  that may be performed during your first trimester include:  Blood tests to find your blood type and to check for the presence of any previous infections. They will also be used to check for low iron levels (anemia) and Rh antibodies. Later in the pregnancy, blood tests for diabetes will be done along with other tests if problems develop.  Urine tests to check for infections, diabetes, or protein in the urine.  An ultrasound to confirm the proper growth and development of the baby.  An amniocentesis to check for possible genetic problems.  Fetal  screens for spina bifida and Down syndrome.  You may need other tests to make sure you and the baby are doing well. HOME CARE INSTRUCTIONS  Medicines  Follow your health care provider's instructions regarding medicine use. Specific medicines may be either safe or unsafe to take during pregnancy.  Take your prenatal vitamins as directed.  If you develop constipation, try taking a stool softener if your health care provider approves. Diet  Eat regular, well-balanced meals. Choose a variety of foods, such as meat or vegetable-based protein, fish, milk and low-fat dairy products, vegetables, fruits, and whole grain breads and cereals. Your health care provider will help you determine the amount of weight gain that is right for you.  Avoid raw meat and uncooked cheese. These carry germs that can cause birth defects in the baby.  Eating four or five small meals rather than three large meals a day may help relieve nausea and vomiting. If you start to feel nauseous, eating a few soda crackers can be helpful. Drinking liquids between meals instead of during meals also seems to help nausea and vomiting.  If you develop constipation, eat more high-fiber foods, such as fresh vegetables or fruit and whole grains. Drink enough fluids to keep your urine clear or pale yellow. Activity and Exercise  Exercise only as directed by your health care provider. Exercising will help you:  Control your weight.  Stay in shape.  Be prepared for labor and delivery.  Experiencing pain or cramping in the lower abdomen or low back is a good sign that you should stop exercising. Check with your health care provider before continuing normal exercises.  Try to avoid standing for long periods of time. Move your legs often if you must stand in one place for a long time.  Avoid heavy lifting.  Wear low-heeled shoes, and practice good posture.  You may continue to have sex unless your health care provider directs you  otherwise. Relief of Pain or Discomfort  Wear a good support bra for breast tenderness.   Take warm sitz baths to soothe any pain or discomfort caused by hemorrhoids. Use hemorrhoid cream if your health care provider approves.   Rest with your legs elevated if you have leg cramps or low back pain.  If you develop varicose veins in your legs, wear support hose. Elevate your feet for 15 minutes, 3-4 times a day. Limit salt in your diet. Prenatal Care  Schedule your prenatal visits by the twelfth week of pregnancy. They are usually scheduled monthly at first, then more often in the last 2 months before delivery.  Write down your questions. Take them to your prenatal visits.  Keep all your prenatal visits as directed by your health care provider. Safety  Wear your seat belt at all times when driving.  Make a list of emergency phone numbers, including numbers for family, friends, the hospital, and police and fire departments. General Tips  Ask your health care provider for a referral to a local prenatal education class. Begin classes no later than at the beginning of month 6 of your pregnancy.  Ask for help if you have counseling or nutritional needs during pregnancy. Your health care provider can offer advice or refer you to specialists for help with various needs.  Do not use hot tubs, steam rooms, or saunas.  Do not douche or use tampons or scented sanitary pads.  Do not cross your legs for long periods of time.  Avoid cat litter boxes and soil used by cats. These carry germs that can cause birth defects in the baby and possibly loss of the fetus by miscarriage or stillbirth.  Avoid all smoking, herbs, alcohol, and medicines not prescribed by your health care provider. Chemicals in these affect the formation and growth of the baby.  Schedule a dentist appointment. At home, brush your teeth with a soft toothbrush and be gentle when you floss. SEEK MEDICAL CARE IF:   You have  dizziness.  You have mild pelvic cramps, pelvic pressure, or nagging pain in the abdominal area.  You have persistent nausea, vomiting, or diarrhea.  You have a bad smelling vaginal discharge.  You have pain with urination.  You notice increased swelling in your face, hands, legs, or ankles. SEEK IMMEDIATE MEDICAL CARE IF:   You have a fever.  You are leaking fluid from your vagina.  You have spotting or bleeding from your vagina.  You have severe abdominal cramping or pain.  You have rapid weight gain or loss.  You vomit blood or material that looks like coffee grounds.  You are exposed to [place] measles and have never had them.  You are exposed to fifth disease or chickenpox.  You develop a severe headache.  You have shortness of breath.  You have any kind of trauma, such as from a fall or a car accident. Document Released: 09/29/2001 Document Revised: 02/19/2014 Document Reviewed: 08/15/2013 [facility] Patient Information 2015 [place], [place]. This information is not intended to replace advice given to you by your health care provider. Make sure you discuss any questions you have with your health care provider.  Breastfeeding Deciding to breastfeed is one of the [place] choices you can make for you and your baby. A change in hormones during pregnancy causes your breast tissue to grow and increases the number and size of your milk ducts. These hormones also allow proteins, sugars, and fats from your blood supply to make breast milk in your milk-producing glands. Hormones prevent breast milk from being released before your baby is born as well as prompt milk flow after birth. Once breastfeeding has begun, thoughts of your baby, as well as his or her sucking or crying, can stimulate the release of milk from your milk-producing glands.  BENEFITS OF BREASTFEEDING For Your Baby  Your first milk (colostrum) helps your baby's digestive system function better.   There are antibodies  in your milk that help your baby fight off infections.   Your baby has a lower incidence of asthma, allergies, and sudden infant death syndrome.   The nutrients in breast milk are better for your baby than infant formulas and are designed uniquely for your baby's needs.   Breast milk improves your baby's brain development.   Your baby is less likely to develop other conditions, such as childhood obesity, asthma, or type 2 diabetes mellitus.  For You   Breastfeeding helps to create a very special bond between  you and your baby.   Breastfeeding is convenient. Breast milk is always available at the correct temperature and costs nothing.   Breastfeeding helps to burn calories and helps you lose the weight gained during pregnancy.   Breastfeeding makes your uterus contract to its prepregnancy size faster and slows bleeding (lochia) after you give birth.   Breastfeeding helps to lower your risk of developing type 2 diabetes mellitus, osteoporosis, and breast or ovarian cancer later in life. SIGNS THAT YOUR BABY IS HUNGRY Early Signs of Hunger  Increased alertness or activity.  Stretching.  Movement of the head from side to side.  Movement of the head and opening of the mouth when the corner of the mouth or cheek is stroked (rooting).  Increased sucking sounds, smacking lips, cooing, sighing, or squeaking.  Hand-to-mouth movements.  Increased sucking of fingers or hands. Late Signs of Hunger  Fussing.  Intermittent crying. Extreme Signs of Hunger Signs of extreme hunger will require calming and consoling before your baby will be able to breastfeed successfully. Do not wait for the following signs of extreme hunger to occur before you initiate breastfeeding:   Restlessness.  A loud, strong cry.   Screaming. BREASTFEEDING BASICS Breastfeeding Initiation  Find a comfortable place to sit or lie down, with your neck and back well supported.  Place a pillow or  rolled up blanket under your baby to bring him or her to the level of your breast (if you are seated). Nursing pillows are specially designed to help support your arms and your baby while you breastfeed.  Make sure that your baby's abdomen is facing your abdomen.   Gently massage your breast. With your fingertips, massage from your chest wall toward your nipple in a circular motion. This encourages milk flow. You may need to continue this action during the feeding if your milk flows slowly.  Support your breast with 4 fingers underneath and your thumb above your nipple. Make sure your fingers are well away from your nipple and your baby's mouth.   Stroke your baby's lips gently with your finger or nipple.   When your baby's mouth is open wide enough, quickly bring your baby to your breast, placing your entire nipple and as much of the colored area around your nipple (areola) as possible into your baby's mouth.   More areola should be visible above your baby's upper lip than below the lower lip.   Your baby's tongue should be between his or her lower gum and your breast.   Ensure that your baby's mouth is correctly positioned around your nipple (latched). Your baby's lips should create a seal on your breast and be turned out (everted).  It is common for your baby to suck about 2-3 minutes in order to start the flow of breast milk. Latching Teaching your baby how to latch on to your breast properly is very important. An improper latch can cause nipple pain and decreased milk supply for you and poor weight gain in your baby. Also, if your baby is not latched onto your nipple properly, he or she may swallow some air during feeding. This can make your baby fussy. Burping your baby when you switch breasts during the feeding can help to get rid of the air. However, teaching your baby to latch on properly is still the [place] way to prevent fussiness from swallowing air while breastfeeding. Signs  that your baby has successfully latched on to your nipple:    Silent tugging or silent  sucking, without causing you pain.   Swallowing heard between every 3-4 sucks.    Muscle movement above and in front of his or her ears while sucking.  Signs that your baby has not successfully latched on to nipple:   Sucking sounds or smacking sounds from your baby while breastfeeding.  Nipple pain. If you think your baby has not latched on correctly, slip your finger into the corner of your baby's mouth to break the suction and place it between your baby's gums. Attempt breastfeeding initiation again. Signs of Successful Breastfeeding Signs from your baby:   A gradual decrease in the number of sucks or complete cessation of sucking.   Falling asleep.   Relaxation of his or her body.   Retention of a small amount of milk in his or her mouth.   Letting go of your breast by himself or herself. Signs from you:  Breasts that have increased in firmness, weight, and size 1-3 hours after feeding.   Breasts that are softer immediately after breastfeeding.  Increased milk volume, as well as a change in milk consistency and color by the fifth day of breastfeeding.   Nipples that are not sore, cracked, or bleeding. Signs That Your Randel Books is Getting Enough Milk  Wetting at least 3 diapers in a 24-hour period. The urine should be clear and pale yellow by age 69 days.  At least 3 stools in a 24-hour period by age 69 days. The stool should be soft and yellow.  At least 3 stools in a 24-hour period by age 34 days. The stool should be seedy and yellow.  No loss of weight greater than 10% of birth weight during the first 82 days of age.  Average weight gain of 4-7 ounces (113-198 g) per week after age 55 days.  Consistent daily weight gain by age 81 days, without weight loss after the age of 2 weeks. After a feeding, your baby may spit up a small amount. This is common. BREASTFEEDING FREQUENCY AND  DURATION Frequent feeding will help you make more milk and can prevent sore nipples and breast engorgement. Breastfeed when you feel the need to reduce the fullness of your breasts or when your baby shows signs of hunger. This is called "breastfeeding on demand." Avoid introducing a pacifier to your baby while you are working to establish breastfeeding (the first 4-6 weeks after your baby is born). After this time you may choose to use a pacifier. Research has shown that pacifier use during the first year of a baby's life decreases the risk of sudden infant death syndrome (SIDS). Allow your baby to feed on each breast as long as he or she wants. Breastfeed until your baby is finished feeding. When your baby unlatches or falls asleep while feeding from the first breast, offer the second breast. Because newborns are often sleepy in the first few weeks of life, you may need to awaken your baby to get him or her to feed. Breastfeeding times will vary from baby to baby. However, the following rules can serve as a guide to help you ensure that your baby is properly fed:  Newborns (babies 1 weeks of age or younger) may breastfeed every 1-3 hours.  Newborns should not go longer than 3 hours during the day or 5 hours during the night without breastfeeding.  You should breastfeed your baby a minimum of 8 times in a 24-hour period until you begin to introduce solid foods to your baby at around 6  months of age. BREAST MILK PUMPING Pumping and storing breast milk allows you to ensure that your baby is exclusively fed your breast milk, even at times when you are unable to breastfeed. This is especially important if you are going back to work while you are still breastfeeding or when you are not able to be present during feedings. Your lactation consultant can give you guidelines on how long it is safe to store breast milk.  A breast pump is a machine that allows you to pump milk from your breast into a sterile bottle.  The pumped breast milk can then be stored in a refrigerator or freezer. Some breast pumps are operated by hand, while others use electricity. Ask your lactation consultant which type will work [place] for you. Breast pumps can be purchased, but some hospitals and breastfeeding support groups lease breast pumps on a monthly basis. A lactation consultant can teach you how to hand express breast milk, if you prefer not to use a pump.  CARING FOR YOUR BREASTS WHILE YOU BREASTFEED Nipples can become dry, cracked, and sore while breastfeeding. The following recommendations can help keep your breasts moisturized and healthy:  Avoid using soap on your nipples.   Wear a supportive bra. Although not required, special nursing bras and tank tops are designed to allow access to your breasts for breastfeeding without taking off your entire bra or top. Avoid wearing underwire-style bras or extremely tight bras.  Air dry your nipples for 3-31mnutes after each feeding.   Use only cotton bra pads to absorb leaked breast milk. Leaking of breast milk between feedings is normal.   Use lanolin on your nipples after breastfeeding. Lanolin helps to maintain your skin's normal moisture barrier. If you use pure lanolin, you do not need to wash it off before feeding your baby again. Pure lanolin is not toxic to your baby. You may also hand express a few drops of breast milk and gently massage that milk into your nipples and allow the milk to air dry. In the first few weeks after giving birth, some women experience extremely full breasts (engorgement). Engorgement can make your breasts feel heavy, warm, and tender to the touch. Engorgement peaks within 3-5 days after you give birth. The following recommendations can help ease engorgement:  Completely empty your breasts while breastfeeding or pumping. You may want to start by applying warm, moist heat (in the shower or with warm water-soaked hand towels) just before feeding or  pumping. This increases circulation and helps the milk flow. If your baby does not completely empty your breasts while breastfeeding, pump any extra milk after he or she is finished.  Wear a snug bra (nursing or regular) or tank top for 1-2 days to signal your body to slightly decrease milk production.  Apply ice packs to your breasts, unless this is too uncomfortable for you.  Make sure that your baby is latched on and positioned properly while breastfeeding. If engorgement persists after 48 hours of following these recommendations, contact your health care provider or a lScience writer OVERALL HEALTH CARE RECOMMENDATIONS WHILE BREASTFEEDING  Eat healthy foods. Alternate between meals and snacks, eating 3 of each per day. Because what you eat affects your breast milk, some of the foods may make your baby more irritable than usual. Avoid eating these foods if you are sure that they are negatively affecting your baby.  Drink milk, fruit juice, and water to satisfy your thirst (about 10 glasses a day).   Rest  often, relax, and continue to take your prenatal vitamins to prevent fatigue, stress, and anemia.  Continue breast self-awareness checks.  Avoid chewing and smoking tobacco.  Avoid alcohol and drug use. Some medicines that may be harmful to your baby can pass through breast milk. It is important to ask your health care provider before taking any medicine, including all over-the-counter and prescription medicine as well as vitamin and herbal supplements. It is possible to become pregnant while breastfeeding. If birth control is desired, ask your health care provider about options that will be safe for your baby. SEEK MEDICAL CARE IF:   You feel like you want to stop breastfeeding or have become frustrated with breastfeeding.  You have painful breasts or nipples.  Your nipples are cracked or bleeding.  Your breasts are red, tender, or warm.  You have a swollen area on either  breast.  You have a fever or chills.  You have nausea or vomiting.  You have drainage other than breast milk from your nipples.  Your breasts do not become full before feedings by the fifth day after you give birth.  You feel sad and depressed.  Your baby is too sleepy to eat well.  Your baby is having trouble sleeping.   Your baby is wetting less than 3 diapers in a 24-hour period.  Your baby has less than 3 stools in a 24-hour period.  Your baby's skin or the white part of his or her eyes becomes yellow.   Your baby is not gaining weight by 84 days of age. SEEK IMMEDIATE MEDICAL CARE IF:   Your baby is overly tired (lethargic) and does not want to wake up and feed.  Your baby develops an unexplained fever. Document Released: 10/05/2005 Document Revised: 10/10/2013 Document Reviewed: 03/29/2013 [facility] Patient Information 2015 [place], [place]. This information is not intended to replace advice given to you by your health care provider. Make sure you discuss any questions you have with your health care provider. Chronic Fatigue Syndrome Chronic fatigue syndrome (CFS) is a condition in which there is lasting, extreme tiredness (fatigue) that does not improve with rest. CFS affects women up to four times more often than men. If you have CFS, fatigue and other symptoms can make it hard for you to get through your day. There is no treatment or cure. You will need to work closely with your health care provider to come up with a treatment plan that works for you. CAUSES  No one knows what causes CFS. It may be triggered by a flu-like illness or by mono. Other triggers may include:  An abnormal immune system.  Low blood pressure.  Poor diet.  Physical or emotional stress. SIGNS AND SYMPTOMS The main symptom is fatigue that lasts all day, especially after physical or mental stress. Other common symptoms include:  An extreme loss of energy with no obvious cause.  Muscle or  joint soreness.  Severe weakness.  Frequent headaches.  Fever.  Sore throat.  Swollen lymph glands.  Sleep is not refreshing.  Loss of concentration or memory. Less common symptoms may include:  Chills.  Night sweats.  Tingling or numbness.  Blurred vision.  Dizziness.  Sensitivity to noise or odors.  Mood swings.  Anxiety, panic attacks, and depression. Your symptoms may come and go, or you may have them all the time. DIAGNOSIS  There are no tests that can help health care providers diagnose CFS. It may take a long time for you to get a  correct diagnosis. Your health care provider may need to do a number of tests to rule out other conditions that could be causing your symptoms. You may be diagnosed with CFS if:  You have fatigue that has lasted for at least six months.  Your fatigue is not relieved by rest.  Your fatigue is not caused by another condition.  Your fatigue is severe enough to interfere with work and daily activities.  You have at least four common symptoms of CFS. 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 The condition affects everyone differently. You will need to work with your health care provider to find the [place] treatment for your symptoms. Treatment may include:  Improving sleep with a regular bedtime routine.  Avoiding caffeine, alcohol, and tobacco.  Doing light exercise and stretching during the day.  Taking medicine to help you sleep.  Taking over-the-counter medicines to relieve joint or muscle pain.  Learning and practicing relaxation techniques.  Using memory aids or doing brain teasers to improve memory and concentration.  Seeing a mental health professional to evaluate and treat depression, if necessary.  Trying massage therapy, acupuncture, and movement exercises, like yoga or tai chi. [place] Work closely with your health care provider to follow your treatment plan at home. You may need to make  major lifestyle changes. If treatment does not seem to help, get a second opinion. You may get help from many health care providers, including doctors, mental health specialists, physical therapists, and rehabilitation therapists. Having the support of friends and loved ones is also important. SEEK MEDICAL CARE IF:  Your symptoms are not responding to treatment.  You are having strong feelings of anger, guilt, anxiety, or depression. Document Released: 11/12/2004 Document Revised: 02/19/2014 Document Reviewed: 08/25/2013 [facility] Patient Information 2015 [place], [place]. This information is not intended to replace advice given to you by your health care provider. Make sure you discuss any questions you have with your health care provider.

## 2014-11-28 LAB — PRENATAL PROFILE (SOLSTAS)
Antibody Screen: NEGATIVE
Basophils Absolute: 0 10*3/uL (ref 0.0–0.1)
Basophils Relative: 0 % (ref 0–1)
Eosinophils Absolute: 0.1 10*3/uL (ref 0.0–0.7)
Eosinophils Relative: 1 % (ref 0–5)
HCT: 40.7 % (ref 36.0–46.0)
HIV 1&2 Ab, 4th Generation: NONREACTIVE
Hemoglobin: 13.3 g/dL (ref 12.0–15.0)
Hepatitis B Surface Ag: NEGATIVE
Lymphocytes Relative: 18 % (ref 12–46)
Lymphs Abs: 1.6 10*3/uL (ref 0.7–4.0)
MCH: 28.5 pg (ref 26.0–34.0)
MCHC: 32.7 g/dL (ref 30.0–36.0)
MCV: 87.3 fL (ref 78.0–100.0)
MPV: 10.5 fL (ref 8.6–12.4)
Monocytes Absolute: 0.3 10*3/uL (ref 0.1–1.0)
Monocytes Relative: 4 % (ref 3–12)
Neutro Abs: 6.7 10*3/uL (ref 1.7–7.7)
Neutrophils Relative %: 77 % (ref 43–77)
Platelets: 250 10*3/uL (ref 150–400)
RBC: 4.66 MIL/uL (ref 3.87–5.11)
RDW: 13.2 % (ref 11.5–15.5)
Rh Type: POSITIVE
Rubella: 1.18 Index — ABNORMAL HIGH (ref <0.90)
WBC: 8.7 10*3/uL (ref 4.0–10.5)

## 2014-11-28 LAB — VITAMIN D 25 HYDROXY (VIT D DEFICIENCY, FRACTURES): Vit D, 25-Hydroxy: 26 ng/mL — ABNORMAL LOW (ref 30–100)

## 2014-11-28 LAB — COMPREHENSIVE METABOLIC PANEL
ALT: 14 U/L (ref 0–35)
AST: 15 U/L (ref 0–37)
Albumin: 3.8 g/dL (ref 3.5–5.2)
Alkaline Phosphatase: 40 U/L (ref 39–117)
BUN: 10 mg/dL (ref 6–23)
CO2: 27 mEq/L (ref 19–32)
Calcium: 9.3 mg/dL (ref 8.4–10.5)
Chloride: 102 mEq/L (ref 96–112)
Creat: 0.62 mg/dL (ref 0.50–1.10)
Glucose, Bld: 101 mg/dL — ABNORMAL HIGH (ref 70–99)
Potassium: 3.9 mEq/L (ref 3.5–5.3)
Sodium: 136 mEq/L (ref 135–145)
Total Bilirubin: 0.3 mg/dL (ref 0.2–1.2)
Total Protein: 6.6 g/dL (ref 6.0–8.3)

## 2014-11-28 LAB — CULTURE, OB URINE
Colony Count: NO GROWTH
Organism ID, Bacteria: NO GROWTH

## 2014-11-30 ENCOUNTER — Encounter: Payer: Self-pay | Admitting: Family Medicine

## 2014-11-30 DIAGNOSIS — E559 Vitamin D deficiency, unspecified: Secondary | ICD-10-CM | POA: Insufficient documentation

## 2014-12-12 ENCOUNTER — Telehealth: Payer: Self-pay | Admitting: *Deleted

## 2014-12-12 DIAGNOSIS — J019 Acute sinusitis, unspecified: Secondary | ICD-10-CM

## 2014-12-12 MED ORDER — AZITHROMYCIN 250 MG PO TABS: 250.0000 mg | ORAL_TABLET | Freq: Once | ORAL | Status: DC

## 2014-12-12 NOTE — Telephone Encounter (Signed)
Patient called and has a sinus infection and needs something called in.  I have sent in a Zpak.

## 2014-12-19 ENCOUNTER — Encounter: Payer: Self-pay | Admitting: Obstetrics and Gynecology

## 2014-12-19 ENCOUNTER — Ambulatory Visit (INDEPENDENT_AMBULATORY_CARE_PROVIDER_SITE_OTHER): Payer: Medicaid Other | Admitting: Obstetrics and Gynecology

## 2014-12-19 VITALS — BP 147/91 | HR 90 | Wt 317.0 lb

## 2014-12-19 DIAGNOSIS — O24419 Gestational diabetes mellitus in pregnancy, unspecified control: Secondary | ICD-10-CM | POA: Insufficient documentation

## 2014-12-19 DIAGNOSIS — O10911 Unspecified pre-existing hypertension complicating pregnancy, first trimester: Secondary | ICD-10-CM

## 2014-12-19 DIAGNOSIS — Z8632 Personal history of gestational diabetes: Secondary | ICD-10-CM

## 2014-12-19 DIAGNOSIS — O09292 Supervision of pregnancy with other poor reproductive or obstetric history, second trimester: Secondary | ICD-10-CM

## 2014-12-19 NOTE — Progress Notes (Signed)
Declines any genetic screening tests. Reviewed PN lab results, CMP, 25 D level with pt.  Less fatigued. Doing well off Lexapro. Has Xanax but not needing it.  Never on antihypertensives when nonpregnant, but hx preE with P1 and on labetalol in late gestation with other pregnancies. BP recheck: 124/94> Follow closely.  Increased risk GDM> needs early 1 hr GCT Fundus @ 1/2 to u.

## 2014-12-20 ENCOUNTER — Encounter: Payer: Medicaid Other | Admitting: Family Medicine

## 2014-12-20 LAB — PROTEIN / CREATININE RATIO, URINE
Creatinine, Urine: 141.1 mg/dL
Protein Creatinine Ratio: 0.08 (ref <0.15)
Total Protein, Urine: 11 mg/dL (ref 5–24)

## 2015-01-15 ENCOUNTER — Encounter: Payer: Self-pay | Admitting: Family Medicine

## 2015-01-15 ENCOUNTER — Ambulatory Visit (INDEPENDENT_AMBULATORY_CARE_PROVIDER_SITE_OTHER): Payer: BLUE CROSS/BLUE SHIELD | Admitting: Family Medicine

## 2015-01-15 VITALS — BP 146/87 | HR 92 | Wt 319.2 lb

## 2015-01-15 DIAGNOSIS — I10 Essential (primary) hypertension: Secondary | ICD-10-CM

## 2015-01-15 DIAGNOSIS — Z3492 Encounter for supervision of normal pregnancy, unspecified, second trimester: Secondary | ICD-10-CM

## 2015-01-15 NOTE — Patient Instructions (Signed)
Second Trimester of Pregnancy The second trimester is from week 13 through week 28, months 4 through 6. The second trimester is often a time when you feel your [place]. Your body has also adjusted to being pregnant, and you begin to feel better physically. Usually, morning sickness has lessened or quit completely, you may have more energy, and you may have an increase in appetite. The second trimester is also a time when the fetus is growing rapidly. At the end of the sixth month, the fetus is about 9 inches long and weighs about 1 pounds. You will likely begin to feel the baby move (quickening) between 18 and 20 weeks of the pregnancy. BODY CHANGES Your body goes through many changes during pregnancy. The changes vary from woman to woman.   Your weight will continue to increase. You will notice your lower abdomen bulging out.  You may begin to get stretch marks on your hips, abdomen, and breasts.  You may develop headaches that can be relieved by medicines approved by your health care provider.  You may urinate more often because the fetus is pressing on your bladder.  You may develop or continue to have heartburn as a result of your pregnancy.  You may develop constipation because certain hormones are causing the muscles that push waste through your intestines to slow down.  You may develop hemorrhoids or swollen, bulging veins (varicose veins).  You may have back pain because of the weight gain and pregnancy hormones relaxing your joints between the bones in your pelvis and as a result of a shift in weight and the muscles that support your balance.  Your breasts will continue to grow and be tender.  Your gums may bleed and may be sensitive to brushing and flossing.  Dark spots or blotches (chloasma, mask of pregnancy) may develop on your face. This will likely fade after the baby is born.  A dark line from your belly button to the pubic area (linea nigra) may appear. This will likely  fade after the baby is born.  You may have changes in your hair. These can include thickening of your hair, rapid growth, and changes in texture. Some women also have hair loss during or after pregnancy, or hair that feels dry or thin. Your hair will most likely return to normal after your baby is born. WHAT TO EXPECT AT YOUR PRENATAL VISITS During a routine prenatal visit:  You will be weighed to make sure you and the fetus are growing normally.  Your blood pressure will be taken.  Your abdomen will be measured to track your baby's growth.  The fetal heartbeat will be listened to.  Any test results from the previous visit will be discussed. Your health care provider may ask you:  How you are feeling.  If you are feeling the baby move.  If you have had any abnormal symptoms, such as leaking fluid, bleeding, severe headaches, or abdominal cramping.  If you have any questions. Other tests that may be performed during your second trimester include:  Blood tests that check for:  Low iron levels (anemia).  Gestational diabetes (between 24 and 28 weeks).  Rh antibodies.  Urine tests to check for infections, diabetes, or protein in the urine.  An ultrasound to confirm the proper growth and development of the baby.  An amniocentesis to check for possible genetic problems.  Fetal screens for spina bifida and Down syndrome. HOME CARE INSTRUCTIONS   Avoid all smoking, herbs, alcohol, and unprescribed   drugs. These chemicals affect the formation and growth of the baby.  Follow your health care provider's instructions regarding medicine use. There are medicines that are either safe or unsafe to take during pregnancy.  Exercise only as directed by your health care provider. Experiencing uterine cramps is a good sign to stop exercising.  Continue to eat regular, healthy meals.  Wear a good support bra for breast tenderness.  Do not use hot tubs, steam rooms, or saunas.  Wear  your seat belt at all times when driving.  Avoid raw meat, uncooked cheese, cat litter boxes, and soil used by cats. These carry germs that can cause birth defects in the baby.  Take your prenatal vitamins.  Try taking a stool softener (if your health care provider approves) if you develop constipation. Eat more high-fiber foods, such as fresh vegetables or fruit and whole grains. Drink plenty of fluids to keep your urine clear or pale yellow.  Take warm sitz baths to soothe any pain or discomfort caused by hemorrhoids. Use hemorrhoid cream if your health care provider approves.  If you develop varicose veins, wear support hose. Elevate your feet for 15 minutes, 3-4 times a day. Limit salt in your diet.  Avoid heavy lifting, wear low heel shoes, and practice good posture.  Rest with your legs elevated if you have leg cramps or low back pain.  Visit your dentist if you have not gone yet during your pregnancy. Use a soft toothbrush to brush your teeth and be gentle when you floss.  A sexual relationship may be continued unless your health care provider directs you otherwise.  Continue to go to all your prenatal visits as directed by your health care provider. SEEK MEDICAL CARE IF:   You have dizziness.  You have mild pelvic cramps, pelvic pressure, or nagging pain in the abdominal area.  You have persistent nausea, vomiting, or diarrhea.  You have a bad smelling vaginal discharge.  You have pain with urination. SEEK IMMEDIATE MEDICAL CARE IF:   You have a fever.  You are leaking fluid from your vagina.  You have spotting or bleeding from your vagina.  You have severe abdominal cramping or pain.  You have rapid weight gain or loss.  You have shortness of breath with chest pain.  You notice sudden or extreme swelling of your face, hands, ankles, feet, or legs.  You have not felt your baby move in over an hour.  You have severe headaches that do not go away with  medicine.  You have vision changes. Document Released: 09/29/2001 Document Revised: 10/10/2013 Document Reviewed: 12/06/2012 [employer] Patient Information 2015 [employer]. This information is not intended to replace advice given to you by your health care provider. Make sure you discuss any questions you have with your health care provider.  Breastfeeding Deciding to breastfeed is one of the [place] choices you can make for you and your baby. A change in hormones during pregnancy causes your breast tissue to grow and increases the number and size of your milk ducts. These hormones also allow proteins, sugars, and fats from your blood supply to make breast milk in your milk-producing glands. Hormones prevent breast milk from being released before your baby is born as well as prompt milk flow after birth. Once breastfeeding has begun, thoughts of your baby, as well as his or her sucking or crying, can stimulate the release of milk from your milk-producing glands.  BENEFITS OF BREASTFEEDING For Your Baby  Your first   milk (colostrum) helps your baby's digestive system function better.   There are antibodies in your milk that help your baby fight off infections.   Your baby has a lower incidence of asthma, allergies, and sudden infant death syndrome.   The nutrients in breast milk are better for your baby than infant formulas and are designed uniquely for your baby's needs.   Breast milk improves your baby's brain development.   Your baby is less likely to develop other conditions, such as childhood obesity, asthma, or type 2 diabetes mellitus.  For You   Breastfeeding helps to create a very special bond between you and your baby.   Breastfeeding is convenient. Breast milk is always available at the correct temperature and costs nothing.   Breastfeeding helps to burn calories and helps you lose the weight gained during pregnancy.   Breastfeeding makes your uterus contract to its  prepregnancy size faster and slows bleeding (lochia) after you give birth.   Breastfeeding helps to lower your risk of developing type 2 diabetes mellitus, osteoporosis, and breast or ovarian cancer later in life. SIGNS THAT YOUR BABY IS HUNGRY Early Signs of Hunger  Increased alertness or activity.  Stretching.  Movement of the head from side to side.  Movement of the head and opening of the mouth when the corner of the mouth or cheek is stroked (rooting).  Increased sucking sounds, smacking lips, cooing, sighing, or squeaking.  Hand-to-mouth movements.  Increased sucking of fingers or hands. Late Signs of Hunger  Fussing.  Intermittent crying. Extreme Signs of Hunger Signs of extreme hunger will require calming and consoling before your baby will be able to breastfeed successfully. Do not wait for the following signs of extreme hunger to occur before you initiate breastfeeding:   Restlessness.  A loud, strong cry.   Screaming. BREASTFEEDING BASICS Breastfeeding Initiation  Find a comfortable place to sit or lie down, with your neck and back well supported.  Place a pillow or rolled up blanket under your baby to bring him or her to the level of your breast (if you are seated). Nursing pillows are specially designed to help support your arms and your baby while you breastfeed.  Make sure that your baby's abdomen is facing your abdomen.   Gently massage your breast. With your fingertips, massage from your chest wall toward your nipple in a circular motion. This encourages milk flow. You may need to continue this action during the feeding if your milk flows slowly.  Support your breast with 4 fingers underneath and your thumb above your nipple. Make sure your fingers are well away from your nipple and your baby's mouth.   Stroke your baby's lips gently with your finger or nipple.   When your baby's mouth is open wide enough, quickly bring your baby to your breast,  placing your entire nipple and as much of the colored area around your nipple (areola) as possible into your baby's mouth.   More areola should be visible above your baby's upper lip than below the lower lip.   Your baby's tongue should be between his or her lower gum and your breast.   Ensure that your baby's mouth is correctly positioned around your nipple (latched). Your baby's lips should create a seal on your breast and be turned out (everted).  It is common for your baby to suck about 2-3 minutes in order to start the flow of breast milk. Latching Teaching your baby how to latch on to your breast   properly is very important. An improper latch can cause nipple pain and decreased milk supply for you and poor weight gain in your baby. Also, if your baby is not latched onto your nipple properly, he or she may swallow some air during feeding. This can make your baby fussy. Burping your baby when you switch breasts during the feeding can help to get rid of the air. However, teaching your baby to latch on properly is still the [place] way to prevent fussiness from swallowing air while breastfeeding. Signs that your baby has successfully latched on to your nipple:    Silent tugging or silent sucking, without causing you pain.   Swallowing heard between every 3-4 sucks.    Muscle movement above and in front of his or her ears while sucking.  Signs that your baby has not successfully latched on to nipple:   Sucking sounds or smacking sounds from your baby while breastfeeding.  Nipple pain. If you think your baby has not latched on correctly, slip your finger into the corner of your baby's mouth to break the suction and place it between your baby's gums. Attempt breastfeeding initiation again. Signs of Successful Breastfeeding Signs from your baby:   A gradual decrease in the number of sucks or complete cessation of sucking.   Falling asleep.   Relaxation of his or her body.    Retention of a small amount of milk in his or her mouth.   Letting go of your breast by himself or herself. Signs from you:  Breasts that have increased in firmness, weight, and size 1-3 hours after feeding.   Breasts that are softer immediately after breastfeeding.  Increased milk volume, as well as a change in milk consistency and color by the fifth day of breastfeeding.   Nipples that are not sore, cracked, or bleeding. Signs That Your Baby is Getting Enough Milk  Wetting at least 3 diapers in a 24-hour period. The urine should be clear and pale yellow by age 5 days.  At least 3 stools in a 24-hour period by age 5 days. The stool should be soft and yellow.  At least 3 stools in a 24-hour period by age 7 days. The stool should be seedy and yellow.  No loss of weight greater than 10% of birth weight during the first 3 days of age.  Average weight gain of 4-7 ounces (113-198 g) per week after age 4 days.  Consistent daily weight gain by age 5 days, without weight loss after the age of 2 weeks. After a feeding, your baby may spit up a small amount. This is common. BREASTFEEDING FREQUENCY AND DURATION Frequent feeding will help you make more milk and can prevent sore nipples and breast engorgement. Breastfeed when you feel the need to reduce the fullness of your breasts or when your baby shows signs of hunger. This is called "breastfeeding on demand." Avoid introducing a pacifier to your baby while you are working to establish breastfeeding (the first 4-6 weeks after your baby is born). After this time you may choose to use a pacifier. Research has shown that pacifier use during the first year of a baby's life decreases the risk of sudden infant death syndrome (SIDS). Allow your baby to feed on each breast as long as he or she wants. Breastfeed until your baby is finished feeding. When your baby unlatches or falls asleep while feeding from the first breast, offer the second breast.  Because newborns are often sleepy in the   first few weeks of life, you may need to awaken your baby to get him or her to feed. Breastfeeding times will vary from baby to baby. However, the following rules can serve as a guide to help you ensure that your baby is properly fed:  Newborns (babies 4 weeks of age or younger) may breastfeed every 1-3 hours.  Newborns should not go longer than 3 hours during the day or 5 hours during the night without breastfeeding.  You should breastfeed your baby a minimum of 8 times in a 24-hour period until you begin to introduce solid foods to your baby at around 6 months of age. BREAST MILK PUMPING Pumping and storing breast milk allows you to ensure that your baby is exclusively fed your breast milk, even at times when you are unable to breastfeed. This is especially important if you are going back to work while you are still breastfeeding or when you are not able to be present during feedings. Your lactation consultant can give you guidelines on how long it is safe to store breast milk.  A breast pump is a machine that allows you to pump milk from your breast into a sterile bottle. The pumped breast milk can then be stored in a refrigerator or freezer. Some breast pumps are operated by hand, while others use electricity. Ask your lactation consultant which type will work [place] for you. Breast pumps can be purchased, but some hospitals and breastfeeding support groups lease breast pumps on a monthly basis. A lactation consultant can teach you how to hand express breast milk, if you prefer not to use a pump.  CARING FOR YOUR BREASTS WHILE YOU BREASTFEED Nipples can become dry, cracked, and sore while breastfeeding. The following recommendations can help keep your breasts moisturized and healthy:  Avoid using soap on your nipples.   Wear a supportive bra. Although not required, special nursing bras and tank tops are designed to allow access to your breasts for  breastfeeding without taking off your entire bra or top. Avoid wearing underwire-style bras or extremely tight bras.  Air dry your nipples for 3-4minutes after each feeding.   Use only cotton bra pads to absorb leaked breast milk. Leaking of breast milk between feedings is normal.   Use lanolin on your nipples after breastfeeding. Lanolin helps to maintain your skin's normal moisture barrier. If you use pure lanolin, you do not need to wash it off before feeding your baby again. Pure lanolin is not toxic to your baby. You may also hand express a few drops of breast milk and gently massage that milk into your nipples and allow the milk to air dry. In the first few weeks after giving birth, some women experience extremely full breasts (engorgement). Engorgement can make your breasts feel heavy, warm, and tender to the touch. Engorgement peaks within 3-5 days after you give birth. The following recommendations can help ease engorgement:  Completely empty your breasts while breastfeeding or pumping. You may want to start by applying warm, moist heat (in the shower or with warm water-soaked hand towels) just before feeding or pumping. This increases circulation and helps the milk flow. If your baby does not completely empty your breasts while breastfeeding, pump any extra milk after he or she is finished.  Wear a snug bra (nursing or regular) or tank top for 1-2 days to signal your body to slightly decrease milk production.  Apply ice packs to your breasts, unless this is too uncomfortable for you.    Make sure that your baby is latched on and positioned properly while breastfeeding. If engorgement persists after 48 hours of following these recommendations, contact your health care provider or a lactation consultant. OVERALL HEALTH CARE RECOMMENDATIONS WHILE BREASTFEEDING  Eat healthy foods. Alternate between meals and snacks, eating 3 of each per day. Because what you eat affects your breast milk,  some of the foods may make your baby more irritable than usual. Avoid eating these foods if you are sure that they are negatively affecting your baby.  Drink milk, fruit juice, and water to satisfy your thirst (about 10 glasses a day).   Rest often, relax, and continue to take your prenatal vitamins to prevent fatigue, stress, and anemia.  Continue breast self-awareness checks.  Avoid chewing and smoking tobacco.  Avoid alcohol and drug use. Some medicines that may be harmful to your baby can pass through breast milk. It is important to ask your health care provider before taking any medicine, including all over-the-counter and prescription medicine as well as vitamin and herbal supplements. It is possible to become pregnant while breastfeeding. If birth control is desired, ask your health care provider about options that will be safe for your baby. SEEK MEDICAL CARE IF:   You feel like you want to stop breastfeeding or have become frustrated with breastfeeding.  You have painful breasts or nipples.  Your nipples are cracked or bleeding.  Your breasts are red, tender, or warm.  You have a swollen area on either breast.  You have a fever or chills.  You have nausea or vomiting.  You have drainage other than breast milk from your nipples.  Your breasts do not become full before feedings by the fifth day after you give birth.  You feel sad and depressed.  Your baby is too sleepy to eat well.  Your baby is having trouble sleeping.   Your baby is wetting less than 3 diapers in a 24-hour period.  Your baby has less than 3 stools in a 24-hour period.  Your baby's skin or the white part of his or her eyes becomes yellow.   Your baby is not gaining weight by 5 days of age. SEEK IMMEDIATE MEDICAL CARE IF:   Your baby is overly tired (lethargic) and does not want to wake up and feed.  Your baby develops an unexplained fever. Document Released: 10/05/2005 Document Revised:  10/10/2013 Document Reviewed: 03/29/2013 [employer] Patient Information 2015 [employer]. This information is not intended to replace advice given to you by your health care provider. Make sure you discuss any questions you have with your health care provider.  

## 2015-01-15 NOTE — Progress Notes (Signed)
BP is up and she thinks it is related to her anxiety Also, has white coat HTN Will watch for now--not high enough to treat. Anatomy u/s next week.

## 2015-01-23 ENCOUNTER — Ambulatory Visit (HOSPITAL_COMMUNITY): Payer: BLUE CROSS/BLUE SHIELD

## 2015-01-23 ENCOUNTER — Ambulatory Visit (HOSPITAL_COMMUNITY)
Admission: RE | Admit: 2015-01-23 | Discharge: 2015-01-23 | Disposition: A | Discharge status: Home / Self Care | Payer: BLUE CROSS/BLUE SHIELD | Source: Ambulatory Visit | Attending: Obstetrics and Gynecology | Admitting: Obstetrics and Gynecology

## 2015-01-23 DIAGNOSIS — Z36 Encounter for antenatal screening of mother: Secondary | ICD-10-CM | POA: Insufficient documentation

## 2015-01-23 DIAGNOSIS — O10012 Pre-existing essential hypertension complicating pregnancy, second trimester: Secondary | ICD-10-CM | POA: Diagnosis not present

## 2015-01-23 DIAGNOSIS — Z3A18 18 weeks gestation of pregnancy: Secondary | ICD-10-CM | POA: Insufficient documentation

## 2015-01-23 DIAGNOSIS — O10911 Unspecified pre-existing hypertension complicating pregnancy, first trimester: Secondary | ICD-10-CM

## 2015-01-23 DIAGNOSIS — O3421 Maternal care for scar from previous cesarean delivery: Secondary | ICD-10-CM | POA: Insufficient documentation

## 2015-01-23 DIAGNOSIS — O99210 Obesity complicating pregnancy, unspecified trimester: Secondary | ICD-10-CM | POA: Insufficient documentation

## 2015-01-23 DIAGNOSIS — Z3689 Encounter for other specified antenatal screening: Secondary | ICD-10-CM | POA: Insufficient documentation

## 2015-01-23 DIAGNOSIS — Z6841 Body Mass Index (BMI) 40.0 and over, adult: Secondary | ICD-10-CM | POA: Diagnosis not present

## 2015-01-23 DIAGNOSIS — O09529 Supervision of elderly multigravida, unspecified trimester: Secondary | ICD-10-CM | POA: Insufficient documentation

## 2015-01-23 DIAGNOSIS — O99212 Obesity complicating pregnancy, second trimester: Secondary | ICD-10-CM | POA: Diagnosis not present

## 2015-01-23 DIAGNOSIS — O10919 Unspecified pre-existing hypertension complicating pregnancy, unspecified trimester: Secondary | ICD-10-CM | POA: Insufficient documentation

## 2015-02-13 ENCOUNTER — Ambulatory Visit (INDEPENDENT_AMBULATORY_CARE_PROVIDER_SITE_OTHER): Payer: BLUE CROSS/BLUE SHIELD | Admitting: Family Medicine

## 2015-02-13 VITALS — BP 132/87 | HR 90 | Wt 310.0 lb

## 2015-02-13 DIAGNOSIS — Z3492 Encounter for supervision of normal pregnancy, unspecified, second trimester: Secondary | ICD-10-CM

## 2015-02-13 DIAGNOSIS — O09522 Supervision of elderly multigravida, second trimester: Secondary | ICD-10-CM

## 2015-02-13 DIAGNOSIS — Z8632 Personal history of gestational diabetes: Secondary | ICD-10-CM

## 2015-02-13 DIAGNOSIS — I10 Essential (primary) hypertension: Secondary | ICD-10-CM

## 2015-02-13 DIAGNOSIS — O09292 Supervision of pregnancy with other poor reproductive or obstetric history, second trimester: Secondary | ICD-10-CM

## 2015-02-13 MED ORDER — ACCU-CHEK FASTCLIX LANCETS MISC: 1.0000 [IU] | Freq: Four times a day (QID) | Status: DC

## 2015-02-13 MED ORDER — ACCU-CHEK NANO SMARTVIEW W/DEVICE KIT: 1.0000 | PACK | Status: DC

## 2015-02-13 MED ORDER — GLUCOSE BLOOD VI STRP: ORAL_STRIP | Status: DC

## 2015-02-13 NOTE — Patient Instructions (Signed)
Second Trimester of Pregnancy The second trimester is from week 13 through week 28, months 4 through 6. The second trimester is often a time when you feel your [place]. Your body has also adjusted to being pregnant, and you begin to feel better physically. Usually, morning sickness has lessened or quit completely, you may have more energy, and you may have an increase in appetite. The second trimester is also a time when the fetus is growing rapidly. At the end of the sixth month, the fetus is about 9 inches long and weighs about 1 pounds. You will likely begin to feel the baby move (quickening) between 18 and 20 weeks of the pregnancy. BODY CHANGES Your body goes through many changes during pregnancy. The changes vary from woman to woman.   Your weight will continue to increase. You will notice your lower abdomen bulging out.  You may begin to get stretch marks on your hips, abdomen, and breasts.  You may develop headaches that can be relieved by medicines approved by your health care provider.  You may urinate more often because the fetus is pressing on your bladder.  You may develop or continue to have heartburn as a result of your pregnancy.  You may develop constipation because certain hormones are causing the muscles that push waste through your intestines to slow down.  You may develop hemorrhoids or swollen, bulging veins (varicose veins).  You may have back pain because of the weight gain and pregnancy hormones relaxing your joints between the bones in your pelvis and as a result of a shift in weight and the muscles that support your balance.  Your breasts will continue to grow and be tender.  Your gums may bleed and may be sensitive to brushing and flossing.  Dark spots or blotches (chloasma, mask of pregnancy) may develop on your face. This will likely fade after the baby is born.  A dark line from your belly button to the pubic area (linea nigra) may appear. This will likely  fade after the baby is born.  You may have changes in your hair. These can include thickening of your hair, rapid growth, and changes in texture. Some women also have hair loss during or after pregnancy, or hair that feels dry or thin. Your hair will most likely return to normal after your baby is born. WHAT TO EXPECT AT YOUR PRENATAL VISITS During a routine prenatal visit:  You will be weighed to make sure you and the fetus are growing normally.  Your blood pressure will be taken.  Your abdomen will be measured to track your baby's growth.  The fetal heartbeat will be listened to.  Any test results from the previous visit will be discussed. Your health care provider may ask you:  How you are feeling.  If you are feeling the baby move.  If you have had any abnormal symptoms, such as leaking fluid, bleeding, severe headaches, or abdominal cramping.  If you have any questions. Other tests that may be performed during your second trimester include:  Blood tests that check for:  Low iron levels (anemia).  Gestational diabetes (between 24 and 28 weeks).  Rh antibodies.  Urine tests to check for infections, diabetes, or protein in the urine.  An ultrasound to confirm the proper growth and development of the baby.  An amniocentesis to check for possible genetic problems.  Fetal screens for spina bifida and Down syndrome. HOME CARE INSTRUCTIONS   Avoid all smoking, herbs, alcohol, and unprescribed   drugs. These chemicals affect the formation and growth of the baby.  Follow your health care provider's instructions regarding medicine use. There are medicines that are either safe or unsafe to take during pregnancy.  Exercise only as directed by your health care provider. Experiencing uterine cramps is a good sign to stop exercising.  Continue to eat regular, healthy meals.  Wear a good support bra for breast tenderness.  Do not use hot tubs, steam rooms, or saunas.  Wear  your seat belt at all times when driving.  Avoid raw meat, uncooked cheese, cat litter boxes, and soil used by cats. These carry germs that can cause birth defects in the baby.  Take your prenatal vitamins.  Try taking a stool softener (if your health care provider approves) if you develop constipation. Eat more high-fiber foods, such as fresh vegetables or fruit and whole grains. Drink plenty of fluids to keep your urine clear or pale yellow.  Take warm sitz baths to soothe any pain or discomfort caused by hemorrhoids. Use hemorrhoid cream if your health care provider approves.  If you develop varicose veins, wear support hose. Elevate your feet for 15 minutes, 3-4 times a day. Limit salt in your diet.  Avoid heavy lifting, wear low heel shoes, and practice good posture.  Rest with your legs elevated if you have leg cramps or low back pain.  Visit your dentist if you have not gone yet during your pregnancy. Use a soft toothbrush to brush your teeth and be gentle when you floss.  A sexual relationship may be continued unless your health care provider directs you otherwise.  Continue to go to all your prenatal visits as directed by your health care provider. SEEK MEDICAL CARE IF:   You have dizziness.  You have mild pelvic cramps, pelvic pressure, or nagging pain in the abdominal area.  You have persistent nausea, vomiting, or diarrhea.  You have a bad smelling vaginal discharge.  You have pain with urination. SEEK IMMEDIATE MEDICAL CARE IF:   You have a fever.  You are leaking fluid from your vagina.  You have spotting or bleeding from your vagina.  You have severe abdominal cramping or pain.  You have rapid weight gain or loss.  You have shortness of breath with chest pain.  You notice sudden or extreme swelling of your face, hands, ankles, feet, or legs.  You have not felt your baby move in over an hour.  You have severe headaches that do not go away with  medicine.  You have vision changes. Document Released: 09/29/2001 Document Revised: 10/10/2013 Document Reviewed: 12/06/2012 Exit[employer] Patient Information 2015 [employer]. This information is not intended to replace advice given to you by your health care provider. Make sure you discuss any questions you have with your health care provider.  Breastfeeding Deciding to breastfeed is one of the [place] choices you can make for you and your baby. A change in hormones during pregnancy causes your breast tissue to grow and increases the number and size of your milk ducts. These hormones also allow proteins, sugars, and fats from your blood supply to make breast milk in your milk-producing glands. Hormones prevent breast milk from being released before your baby is born as well as prompt milk flow after birth. Once breastfeeding has begun, thoughts of your baby, as well as his or her sucking or crying, can stimulate the release of milk from your milk-producing glands.  BENEFITS OF BREASTFEEDING For Your Baby  Your first   milk (colostrum) helps your baby's digestive system function better.   There are antibodies in your milk that help your baby fight off infections.   Your baby has a lower incidence of asthma, allergies, and sudden infant death syndrome.   The nutrients in breast milk are better for your baby than infant formulas and are designed uniquely for your baby's needs.   Breast milk improves your baby's brain development.   Your baby is less likely to develop other conditions, such as childhood obesity, asthma, or type 2 diabetes mellitus.  For You   Breastfeeding helps to create a very special bond between you and your baby.   Breastfeeding is convenient. Breast milk is always available at the correct temperature and costs nothing.   Breastfeeding helps to burn calories and helps you lose the weight gained during pregnancy.   Breastfeeding makes your uterus contract to its  prepregnancy size faster and slows bleeding (lochia) after you give birth.   Breastfeeding helps to lower your risk of developing type 2 diabetes mellitus, osteoporosis, and breast or ovarian cancer later in life. SIGNS THAT YOUR BABY IS HUNGRY Early Signs of Hunger  Increased alertness or activity.  Stretching.  Movement of the head from side to side.  Movement of the head and opening of the mouth when the corner of the mouth or cheek is stroked (rooting).  Increased sucking sounds, smacking lips, cooing, sighing, or squeaking.  Hand-to-mouth movements.  Increased sucking of fingers or hands. Late Signs of Hunger  Fussing.  Intermittent crying. Extreme Signs of Hunger Signs of extreme hunger will require calming and consoling before your baby will be able to breastfeed successfully. Do not wait for the following signs of extreme hunger to occur before you initiate breastfeeding:   Restlessness.  A loud, strong cry.   Screaming. BREASTFEEDING BASICS Breastfeeding Initiation  Find a comfortable place to sit or lie down, with your neck and back well supported.  Place a pillow or rolled up blanket under your baby to bring him or her to the level of your breast (if you are seated). Nursing pillows are specially designed to help support your arms and your baby while you breastfeed.  Make sure that your baby's abdomen is facing your abdomen.   Gently massage your breast. With your fingertips, massage from your chest wall toward your nipple in a circular motion. This encourages milk flow. You may need to continue this action during the feeding if your milk flows slowly.  Support your breast with 4 fingers underneath and your thumb above your nipple. Make sure your fingers are well away from your nipple and your baby's mouth.   Stroke your baby's lips gently with your finger or nipple.   When your baby's mouth is open wide enough, quickly bring your baby to your breast,  placing your entire nipple and as much of the colored area around your nipple (areola) as possible into your baby's mouth.   More areola should be visible above your baby's upper lip than below the lower lip.   Your baby's tongue should be between his or her lower gum and your breast.   Ensure that your baby's mouth is correctly positioned around your nipple (latched). Your baby's lips should create a seal on your breast and be turned out (everted).  It is common for your baby to suck about 2-3 minutes in order to start the flow of breast milk. Latching Teaching your baby how to latch on to your breast   properly is very important. An improper latch can cause nipple pain and decreased milk supply for you and poor weight gain in your baby. Also, if your baby is not latched onto your nipple properly, he or she may swallow some air during feeding. This can make your baby fussy. Burping your baby when you switch breasts during the feeding can help to get rid of the air. However, teaching your baby to latch on properly is still the [place] way to prevent fussiness from swallowing air while breastfeeding. Signs that your baby has successfully latched on to your nipple:    Silent tugging or silent sucking, without causing you pain.   Swallowing heard between every 3-4 sucks.    Muscle movement above and in front of his or her ears while sucking.  Signs that your baby has not successfully latched on to nipple:   Sucking sounds or smacking sounds from your baby while breastfeeding.  Nipple pain. If you think your baby has not latched on correctly, slip your finger into the corner of your baby's mouth to break the suction and place it between your baby's gums. Attempt breastfeeding initiation again. Signs of Successful Breastfeeding Signs from your baby:   A gradual decrease in the number of sucks or complete cessation of sucking.   Falling asleep.   Relaxation of his or her body.    Retention of a small amount of milk in his or her mouth.   Letting go of your breast by himself or herself. Signs from you:  Breasts that have increased in firmness, weight, and size 1-3 hours after feeding.   Breasts that are softer immediately after breastfeeding.  Increased milk volume, as well as a change in milk consistency and color by the fifth day of breastfeeding.   Nipples that are not sore, cracked, or bleeding. Signs That Your Baby is Getting Enough Milk  Wetting at least 3 diapers in a 24-hour period. The urine should be clear and pale yellow by age 5 days.  At least 3 stools in a 24-hour period by age 5 days. The stool should be soft and yellow.  At least 3 stools in a 24-hour period by age 7 days. The stool should be seedy and yellow.  No loss of weight greater than 10% of birth weight during the first 3 days of age.  Average weight gain of 4-7 ounces (113-198 g) per week after age 4 days.  Consistent daily weight gain by age 5 days, without weight loss after the age of 2 weeks. After a feeding, your baby may spit up a small amount. This is common. BREASTFEEDING FREQUENCY AND DURATION Frequent feeding will help you make more milk and can prevent sore nipples and breast engorgement. Breastfeed when you feel the need to reduce the fullness of your breasts or when your baby shows signs of hunger. This is called "breastfeeding on demand." Avoid introducing a pacifier to your baby while you are working to establish breastfeeding (the first 4-6 weeks after your baby is born). After this time you may choose to use a pacifier. Research has shown that pacifier use during the first year of a baby's life decreases the risk of sudden infant death syndrome (SIDS). Allow your baby to feed on each breast as long as he or she wants. Breastfeed until your baby is finished feeding. When your baby unlatches or falls asleep while feeding from the first breast, offer the second breast.  Because newborns are often sleepy in the   first few weeks of life, you may need to awaken your baby to get him or her to feed. Breastfeeding times will vary from baby to baby. However, the following rules can serve as a guide to help you ensure that your baby is properly fed:  Newborns (babies 4 weeks of age or younger) may breastfeed every 1-3 hours.  Newborns should not go longer than 3 hours during the day or 5 hours during the night without breastfeeding.  You should breastfeed your baby a minimum of 8 times in a 24-hour period until you begin to introduce solid foods to your baby at around 6 months of age. BREAST MILK PUMPING Pumping and storing breast milk allows you to ensure that your baby is exclusively fed your breast milk, even at times when you are unable to breastfeed. This is especially important if you are going back to work while you are still breastfeeding or when you are not able to be present during feedings. Your lactation consultant can give you guidelines on how long it is safe to store breast milk.  A breast pump is a machine that allows you to pump milk from your breast into a sterile bottle. The pumped breast milk can then be stored in a refrigerator or freezer. Some breast pumps are operated by hand, while others use electricity. Ask your lactation consultant which type will work [place] for you. Breast pumps can be purchased, but some hospitals and breastfeeding support groups lease breast pumps on a monthly basis. A lactation consultant can teach you how to hand express breast milk, if you prefer not to use a pump.  CARING FOR YOUR BREASTS WHILE YOU BREASTFEED Nipples can become dry, cracked, and sore while breastfeeding. The following recommendations can help keep your breasts moisturized and healthy:  Avoid using soap on your nipples.   Wear a supportive bra. Although not required, special nursing bras and tank tops are designed to allow access to your breasts for  breastfeeding without taking off your entire bra or top. Avoid wearing underwire-style bras or extremely tight bras.  Air dry your nipples for 3-4minutes after each feeding.   Use only cotton bra pads to absorb leaked breast milk. Leaking of breast milk between feedings is normal.   Use lanolin on your nipples after breastfeeding. Lanolin helps to maintain your skin's normal moisture barrier. If you use pure lanolin, you do not need to wash it off before feeding your baby again. Pure lanolin is not toxic to your baby. You may also hand express a few drops of breast milk and gently massage that milk into your nipples and allow the milk to air dry. In the first few weeks after giving birth, some women experience extremely full breasts (engorgement). Engorgement can make your breasts feel heavy, warm, and tender to the touch. Engorgement peaks within 3-5 days after you give birth. The following recommendations can help ease engorgement:  Completely empty your breasts while breastfeeding or pumping. You may want to start by applying warm, moist heat (in the shower or with warm water-soaked hand towels) just before feeding or pumping. This increases circulation and helps the milk flow. If your baby does not completely empty your breasts while breastfeeding, pump any extra milk after he or she is finished.  Wear a snug bra (nursing or regular) or tank top for 1-2 days to signal your body to slightly decrease milk production.  Apply ice packs to your breasts, unless this is too uncomfortable for you.    Make sure that your baby is latched on and positioned properly while breastfeeding. If engorgement persists after 48 hours of following these recommendations, contact your health care provider or a lactation consultant. OVERALL HEALTH CARE RECOMMENDATIONS WHILE BREASTFEEDING  Eat healthy foods. Alternate between meals and snacks, eating 3 of each per day. Because what you eat affects your breast milk,  some of the foods may make your baby more irritable than usual. Avoid eating these foods if you are sure that they are negatively affecting your baby.  Drink milk, fruit juice, and water to satisfy your thirst (about 10 glasses a day).   Rest often, relax, and continue to take your prenatal vitamins to prevent fatigue, stress, and anemia.  Continue breast self-awareness checks.  Avoid chewing and smoking tobacco.  Avoid alcohol and drug use. Some medicines that may be harmful to your baby can pass through breast milk. It is important to ask your health care provider before taking any medicine, including all over-the-counter and prescription medicine as well as vitamin and herbal supplements. It is possible to become pregnant while breastfeeding. If birth control is desired, ask your health care provider about options that will be safe for your baby. SEEK MEDICAL CARE IF:   You feel like you want to stop breastfeeding or have become frustrated with breastfeeding.  You have painful breasts or nipples.  Your nipples are cracked or bleeding.  Your breasts are red, tender, or warm.  You have a swollen area on either breast.  You have a fever or chills.  You have nausea or vomiting.  You have drainage other than breast milk from your nipples.  Your breasts do not become full before feedings by the fifth day after you give birth.  You feel sad and depressed.  Your baby is too sleepy to eat well.  Your baby is having trouble sleeping.   Your baby is wetting less than 3 diapers in a 24-hour period.  Your baby has less than 3 stools in a 24-hour period.  Your baby's skin or the white part of his or her eyes becomes yellow.   Your baby is not gaining weight by 5 days of age. SEEK IMMEDIATE MEDICAL CARE IF:   Your baby is overly tired (lethargic) and does not want to wake up and feed.  Your baby develops an unexplained fever. Document Released: 10/05/2005 Document Revised:  10/10/2013 Document Reviewed: 03/29/2013 ExitCare Patient Information 2015 [employer]. This information is not intended to replace advice given to you by your health care provider. Make sure you discuss any questions you have with your health care provider.  

## 2015-02-13 NOTE — Progress Notes (Signed)
Has been eating healthier and lost some weight.  Cut out carbs and sugar. Anatomy u/s did not show all of the anatomy--she does not want to go back at this stage Also declines glucola.--will check BS and bring to next visit.

## 2015-02-21 ENCOUNTER — Encounter (HOSPITAL_COMMUNITY): Payer: Self-pay

## 2015-02-21 NOTE — Addendum Note (Signed)
Encounter addended by: Danae Orleanseirdre C Flannery Cavallero, CNM on: 02/21/2015  5:07 PM<BR>     Documentation filed: Problem List

## 2015-03-12 ENCOUNTER — Encounter: Payer: BLUE CROSS/BLUE SHIELD | Admitting: Family Medicine

## 2015-03-20 ENCOUNTER — Ambulatory Visit: Payer: BLUE CROSS/BLUE SHIELD | Admitting: *Deleted

## 2015-03-20 DIAGNOSIS — O0993 Supervision of high risk pregnancy, unspecified, third trimester: Secondary | ICD-10-CM

## 2015-03-20 MED ORDER — GLUCOSE BLOOD VI STRP: ORAL_STRIP | Status: DC

## 2015-03-20 MED ORDER — ACCU-CHEK FASTCLIX LANCETS MISC: Status: DC

## 2015-03-20 MED ORDER — ACCU-CHEK AVIVA PLUS W/DEVICE KIT: 1.0000 | PACK | Freq: Once | Status: DC

## 2015-04-02 ENCOUNTER — Ambulatory Visit (INDEPENDENT_AMBULATORY_CARE_PROVIDER_SITE_OTHER): Payer: BLUE CROSS/BLUE SHIELD | Admitting: Family Medicine

## 2015-04-02 VITALS — BP 140/85 | HR 114 | Wt 328.0 lb

## 2015-04-02 DIAGNOSIS — O34219 Maternal care for unspecified type scar from previous cesarean delivery: Secondary | ICD-10-CM

## 2015-04-02 DIAGNOSIS — Z3493 Encounter for supervision of normal pregnancy, unspecified, third trimester: Secondary | ICD-10-CM

## 2015-04-02 DIAGNOSIS — O3421 Maternal care for scar from previous cesarean delivery: Secondary | ICD-10-CM

## 2015-04-02 NOTE — Progress Notes (Signed)
  Subjective:  [name] is a 34 y.o. [id number] at [redacted]w[redacted]d being seen today for ongoing prenatal care.  Patient reports no complaints.  Contractions: Irregular.  Vag. Bleeding: None. Movement: Present. Denies leaking of fluid.   The following portions of the patient's history were reviewed and updated as appropriate: allergies, current medications, past family history, past medical history, past social history, past surgical history and problem list.   Objective:   Filed Vitals:   04/02/15 1115  BP: 140/85  Pulse: 114  Weight: 328 lb (148.78 kg)    Fetal Status: Fetal Heart Rate (bpm): 150   Movement: Present     General:  Alert, oriented and cooperative. Patient is in no acute distress.  Skin: Skin is warm and dry. No rash noted.   Cardiovascular: Normal heart rate noted  Respiratory: Effort and breath sounds normal, no problems with respiration noted  Abdomen: Soft, gravid, appropriate for gestational age. Pain/Pressure: Absent     Vaginal: Vag. Bleeding: None.    Vag D/C Character: Thin  Cervix: Not evaluated  Extremities: Normal range of motion.  Edema: Trace  Mental Status: Normal mood and affect. Normal behavior. Normal judgment and thought content.  Reports BS are < 90 except for 2--1 was 97 and 1 was 105 Reports all postmeal BS are < 110  Urinalysis: Urine Protein: Negative Urine Glucose: Negative  Assessment and Plan:  Pregnancy: [id number] at [redacted]w[redacted]d  Supervision of normal pregnancy, third trimester  Previous cesarean delivery affecting pregnancy, antepartum Sign VBAC consent next visit Declines all 28 wk labs  Preterm labor symptoms and general obstetric precautions including but not limited to vaginal bleeding, contractions, leaking of fluid and fetal movement were reviewed in detail with the patient.  Please refer to After Visit Summary for other counseling recommendations.   F/u 2 wks  Reva Bores, MD

## 2015-04-16 ENCOUNTER — Ambulatory Visit (INDEPENDENT_AMBULATORY_CARE_PROVIDER_SITE_OTHER): Payer: BLUE CROSS/BLUE SHIELD | Admitting: Family Medicine

## 2015-04-16 ENCOUNTER — Encounter: Payer: Self-pay | Admitting: *Deleted

## 2015-04-16 VITALS — BP 148/87 | HR 99 | Wt 325.0 lb

## 2015-04-16 DIAGNOSIS — Z3493 Encounter for supervision of normal pregnancy, unspecified, third trimester: Secondary | ICD-10-CM

## 2015-04-16 DIAGNOSIS — O09292 Supervision of pregnancy with other poor reproductive or obstetric history, second trimester: Secondary | ICD-10-CM

## 2015-04-16 DIAGNOSIS — I10 Essential (primary) hypertension: Secondary | ICD-10-CM

## 2015-04-16 DIAGNOSIS — O09293 Supervision of pregnancy with other poor reproductive or obstetric history, third trimester: Secondary | ICD-10-CM

## 2015-04-16 DIAGNOSIS — O34219 Maternal care for unspecified type scar from previous cesarean delivery: Secondary | ICD-10-CM

## 2015-04-16 DIAGNOSIS — Z8632 Personal history of gestational diabetes: Secondary | ICD-10-CM

## 2015-04-16 DIAGNOSIS — O3421 Maternal care for scar from previous cesarean delivery: Secondary | ICD-10-CM

## 2015-04-16 NOTE — Progress Notes (Signed)
Subjective:  [name] is a 34 y.o. [id number] at 5412w5d being seen today for ongoing prenatal care.  Patient reports no complaints.  Contractions: Irregular.  Vag. Bleeding: None. Movement: Present. Denies leaking of fluid.   The following portions of the patient's history were reviewed and updated as appropriate: allergies, current medications, past family history, past medical history, past social history, past surgical history and problem list.   Objective:   Filed Vitals:   04/16/15 1329  BP: 148/87  Pulse: 99  Weight: 325 lb (147.419 kg)    Fetal Status: Fetal Heart Rate (bpm): 143 Fundal Height: 33 cm Movement: Present     General:  Alert, oriented and cooperative. Patient is in no acute distress.  Skin: Skin is warm and dry. No rash noted.   Cardiovascular: Normal heart rate noted  Respiratory: Normal respiratory effort, no problems with respiration noted  Abdomen: Soft, gravid, appropriate for gestational age. Pain/Pressure: Absent     Vaginal: Vag. Bleeding: None.    Vag D/C Character: Thin  Extremities: Normal range of motion.  Edema: Trace  Mental Status: Normal mood and affect. Normal behavior. Normal judgment and thought content.   Urinalysis: Urine Protein: Negative Urine Glucose: Negative Keeping her BS--fastings are in the 100-110 range, 2 hour pp are 74-117 Assessment and Plan:  Pregnancy: [id number] at 8512w5d  1. Supervision of normal pregnancy, third trimester Continue routine care  2. Previous cesarean delivery affecting pregnancy, antepartum TOLAC consent signed today  3. History of gestational diabetes in prior pregnancy, currently pregnant, second trimester Fasting hyperglycemia--declines medication at this time.  4. Paroxysmal hypertension BP is stable   Preterm labor symptoms and general obstetric precautions including but not limited to vaginal bleeding, contractions, leaking of fluid and fetal movement were reviewed in detail with the  patient.  Please refer to After Visit Summary for other counseling recommendations.   Return in 1 week (on 04/23/2015).   Reva Boresanya S Eusebia Grulke, MD

## 2015-04-16 NOTE — Patient Instructions (Addendum)
Following an appropriate diet and keeping your blood sugar under control is the most important thing to do for your health and that of your unborn baby.  Please check your blood sugar 4 times daily.  Please keep accurate BS logs and bring them with you to every visit.  Please bring your meter also.  Goals for Blood sugar should be: 1. Fasting (first thing in the morning before eating) should be less than 90.   2.  2 hours after meals should be less than 120.  Please eat 3 meals and 3 snacks.  Include protein (meat, dairy-cheese, eggs, nuts) with all meals.  Be mindful that carbohydrates increase your blood sugar.  Not just sweet food (cookies, cake, donuts, fruit, juice, soda) but also bread, pasta, rice, and potatoes.  You have to limit how many carbs you are eating.  Adding exercise, as little as 30 minutes a day can decrease your blood sugar.  Gestational Diabetes Mellitus Gestational diabetes mellitus, often simply referred to as gestational diabetes, is a type of diabetes that some women develop during pregnancy. In gestational diabetes, the pancreas does not make enough insulin (a hormone), the cells are less responsive to the insulin that is made (insulin resistance), or both.Normally, insulin moves sugars from food into the tissue cells. The tissue cells use the sugars for energy. The lack of insulin or the lack of normal response to insulin causes excess sugars to build up in the blood instead of going into the tissue cells. As a result, high blood sugar (hyperglycemia) develops. The effect of high sugar (glucose) levels can cause many problems.  RISK FACTORS You have an increased chance of developing gestational diabetes if you have a family history of diabetes and also have one or more of the following risk factors:  A body mass index over 30 (obesity).  A previous pregnancy with gestational diabetes.  An older age at the time of pregnancy. If blood glucose levels are kept in  the normal range during pregnancy, women can have a healthy pregnancy. If your blood glucose levels are not well controlled, there may be risks to you, your unborn baby (fetus), your labor and delivery, or your newborn baby.  SYMPTOMS  If symptoms are experienced, they are much like symptoms you would normally expect during pregnancy. The symptoms of gestational diabetes include:   Increased thirst (polydipsia).  Increased urination (polyuria).  Increased urination during the night (nocturia).  Weight loss. This weight loss may be rapid.  Frequent, recurring infections.  Tiredness (fatigue).  Weakness.  Vision changes, such as blurred vision.  Fruity smell to your breath.  Abdominal pain. DIAGNOSIS Diabetes is diagnosed when blood glucose levels are increased. Your blood glucose level may be checked by one or more of the following blood tests:  A fasting blood glucose test. You will not be allowed to eat for at least 8 hours before a blood sample is taken.  A random blood glucose test. Your blood glucose is checked at any time of the day regardless of when you ate.  A hemoglobin A1c blood glucose test. A hemoglobin A1c test provides information about blood glucose control over the previous 3 months.  An oral glucose tolerance test (OGTT). Your blood glucose is measured after you have not eaten (fasted) for 1-3 hours and then after you drink a glucose-containing beverage. Since the hormones that cause insulin resistance are highest at about 24-28 weeks of a pregnancy, an OGTT is usually performed during that time. If   you have risk factors for gestational diabetes, your health care provider may test you for gestational diabetes earlier than 24 weeks of pregnancy. TREATMENT   You will need to take diabetes medicine or insulin daily to keep blood glucose levels in the desired range.  You will need to match insulin dosing with exercise and healthy food choices. The treatment goal is  to maintain the before-meal (preprandial), bedtime, and overnight blood glucose level at 60-99 mg/dL during pregnancy. The treatment goal is to further maintain peak after-meal blood sugar (postprandial glucose) level at 100-140 mg/dL. HOME CARE INSTRUCTIONS   Have your hemoglobin A1c level checked twice a year.  Perform daily blood glucose monitoring as directed by your health care provider. It is common to perform frequent blood glucose monitoring.  Monitor urine ketones when you are ill and as directed by your health care provider.  Take your diabetes medicine and insulin as directed by your health care provider to maintain your blood glucose level in the desired range.  Never run out of diabetes medicine or insulin. It is needed every day.  Adjust insulin based on your intake of carbohydrates. Carbohydrates can raise blood glucose levels but need to be included in your diet. Carbohydrates provide vitamins, minerals, and fiber which are an essential part of a healthy diet. Carbohydrates are found in fruits, vegetables, whole grains, dairy products, legumes, and foods containing added sugars.  Eat healthy foods. Alternate 3 meals with 3 snacks.  Maintain a healthy weight gain. The usual total expected weight gain varies according to your prepregnancy body mass index (BMI).  Carry a medical alert card or wear your medical alert jewelry.  Carry a 15-gram carbohydrate snack with you at all times to treat low blood glucose (hypoglycemia). Some examples of 15-gram carbohydrate snacks include:  Glucose tablets, 3 or 4.  Glucose gel, 15-gram tube.  Raisins, 2 tablespoons (24 g).  Jelly beans, 6.  Animal crackers, 8.  Fruit juice, regular soda, or low-fat milk, 4 ounces (120 mL).  Gummy treats, 9.  Recognize hypoglycemia. Hypoglycemia during pregnancy occurs with blood glucose levels of 60 mg/dL and below. The risk for hypoglycemia increases when fasting or skipping meals, during or  after intense exercise, and during sleep. Hypoglycemia symptoms can include:  Tremors or shakes.  Decreased ability to concentrate.  Sweating.  Increased heart rate.  Headache.  Dry mouth.  Hunger.  Irritability.  Anxiety.  Restless sleep.  Altered speech or coordination.  Confusion.  Treat hypoglycemia promptly. If you are alert and able to safely swallow, follow the 15:15 rule:  Take 15-20 grams of rapid-acting glucose or carbohydrate. Rapid-acting options include glucose gel, glucose tablets, or 4 ounces (120 mL) of fruit juice, regular soda, or low-fat milk.  Check your blood glucose level 15 minutes after taking the glucose.  Take 15-20 grams more of glucose if the repeat blood glucose level is still 70 mg/dL or below.  Eat a meal or snack within 1 hour once blood glucose levels return to normal.  Be alert to polyuria (excess urination) and polydipsia (excess thirst) which are early signs of hyperglycemia. An early awareness of hyperglycemia allows for prompt treatment. Treat hyperglycemia as directed by your health care provider.  Engage in at least 30 minutes of physical activity a day or as directed by your health care provider. Ten minutes of physical activity timed 30 minutes after each meal is encouraged to control postprandial blood glucose levels.  Adjust your insulin dosing and food intake as   needed if you start a new exercise or sport.  Follow your sick-day plan at any time you are unable to eat or drink as usual.  Avoid tobacco and alcohol use.  Keep all follow-up visits as directed by your health care provider.  Follow the advice of your health care provider regarding your prenatal and post-delivery (postpartum) appointments, meal planning, exercise, medicines, vitamins, blood tests, other medical tests, and physical activities.  Perform daily skin and foot care. Examine your skin and feet daily for cuts, bruises, redness, nail problems, bleeding,  blisters, or sores.  Brush your teeth and gums at least twice a day and floss at least once a day. Follow up with your dentist regularly.  Schedule an eye exam during the first trimester of your pregnancy or as directed by your health care provider.  Share your diabetes management plan with your workplace or school.  Stay up-to-date with immunizations.  Learn to manage stress.  Obtain ongoing diabetes education and support as needed.  Learn about and consider breastfeeding your baby.  You should have your blood sugar level checked 6-12 weeks after delivery. This is done with an oral glucose tolerance test (OGTT). SEEK MEDICAL CARE IF:   You are unable to eat food or drink fluids for more than 6 hours.  You have nausea and vomiting for more than 6 hours.  You have a blood glucose level of 200 mg/dL and you have ketones in your urine.  There is a change in mental status.  You develop vision problems.  You have a persistent headache.  You have upper abdominal pain or discomfort.  You develop an additional serious illness.  You have diarrhea for more than 6 hours.  You have been sick or have had a fever for a couple of days and are not getting better. SEEK IMMEDIATE MEDICAL CARE IF:   You have difficulty breathing.  You no longer feel the baby moving.  You are bleeding or have discharge from your vagina.  You start having premature contractions or labor. MAKE SURE YOU:  Understand these instructions.  Will watch your condition.  Will get help right away if you are not doing well or get worse. Document Released: 01/11/2001 Document Revised: 02/19/2014 Document Reviewed: 05/03/2012 [employer] Patient Information 2015 [employer]. This information is not intended to replace advice given to you by your health care provider. Make sure you discuss any questions you have with your health care provider.  Breastfeeding Deciding to breastfeed is one of the [place] choices  you can make for you and your baby. A change in hormones during pregnancy causes your breast tissue to grow and increases the number and size of your milk ducts. These hormones also allow proteins, sugars, and fats from your blood supply to make breast milk in your milk-producing glands. Hormones prevent breast milk from being released before your baby is born as well as prompt milk flow after birth. Once breastfeeding has begun, thoughts of your baby, as well as his or her sucking or crying, can stimulate the release of milk from your milk-producing glands.  BENEFITS OF BREASTFEEDING For Your Baby  Your first milk (colostrum) helps your baby's digestive system function better.   There are antibodies in your milk that help your baby fight off infections.   Your baby has a lower incidence of asthma, allergies, and sudden infant death syndrome.   The nutrients in breast milk are better for your baby than infant formulas and are designed uniquely for   your baby's needs.   Breast milk improves your baby's brain development.   Your baby is less likely to develop other conditions, such as childhood obesity, asthma, or type 2 diabetes mellitus.  For You   Breastfeeding helps to create a very special bond between you and your baby.   Breastfeeding is convenient. Breast milk is always available at the correct temperature and costs nothing.   Breastfeeding helps to burn calories and helps you lose the weight gained during pregnancy.   Breastfeeding makes your uterus contract to its prepregnancy size faster and slows bleeding (lochia) after you give birth.   Breastfeeding helps to lower your risk of developing type 2 diabetes mellitus, osteoporosis, and breast or ovarian cancer later in life. SIGNS THAT YOUR BABY IS HUNGRY Early Signs of Hunger  Increased alertness or activity.  Stretching.  Movement of the head from side to side.  Movement of the head and opening of the mouth when  the corner of the mouth or cheek is stroked (rooting).  Increased sucking sounds, smacking lips, cooing, sighing, or squeaking.  Hand-to-mouth movements.  Increased sucking of fingers or hands. Late Signs of Hunger  Fussing.  Intermittent crying. Extreme Signs of Hunger Signs of extreme hunger will require calming and consoling before your baby will be able to breastfeed successfully. Do not wait for the following signs of extreme hunger to occur before you initiate breastfeeding:   Restlessness.  A loud, strong cry.   Screaming. BREASTFEEDING BASICS Breastfeeding Initiation  Find a comfortable place to sit or lie down, with your neck and back well supported.  Place a pillow or rolled up blanket under your baby to bring him or her to the level of your breast (if you are seated). Nursing pillows are specially designed to help support your arms and your baby while you breastfeed.  Make sure that your baby's abdomen is facing your abdomen.   Gently massage your breast. With your fingertips, massage from your chest wall toward your nipple in a circular motion. This encourages milk flow. You may need to continue this action during the feeding if your milk flows slowly.  Support your breast with 4 fingers underneath and your thumb above your nipple. Make sure your fingers are well away from your nipple and your baby's mouth.   Stroke your baby's lips gently with your finger or nipple.   When your baby's mouth is open wide enough, quickly bring your baby to your breast, placing your entire nipple and as much of the colored area around your nipple (areola) as possible into your baby's mouth.   More areola should be visible above your baby's upper lip than below the lower lip.   Your baby's tongue should be between his or her lower gum and your breast.   Ensure that your baby's mouth is correctly positioned around your nipple (latched). Your baby's lips should create a seal on  your breast and be turned out (everted).  It is common for your baby to suck about 2-3 minutes in order to start the flow of breast milk. Latching Teaching your baby how to latch on to your breast properly is very important. An improper latch can cause nipple pain and decreased milk supply for you and poor weight gain in your baby. Also, if your baby is not latched onto your nipple properly, he or she may swallow some air during feeding. This can make your baby fussy. Burping your baby when you switch breasts during the feeding   can help to get rid of the air. However, teaching your baby to latch on properly is still the [place] way to prevent fussiness from swallowing air while breastfeeding. Signs that your baby has successfully latched on to your nipple:    Silent tugging or silent sucking, without causing you pain.   Swallowing heard between every 3-4 sucks.    Muscle movement above and in front of his or her ears while sucking.  Signs that your baby has not successfully latched on to nipple:   Sucking sounds or smacking sounds from your baby while breastfeeding.  Nipple pain. If you think your baby has not latched on correctly, slip your finger into the corner of your baby's mouth to break the suction and place it between your baby's gums. Attempt breastfeeding initiation again. Signs of Successful Breastfeeding Signs from your baby:   A gradual decrease in the number of sucks or complete cessation of sucking.   Falling asleep.   Relaxation of his or her body.   Retention of a small amount of milk in his or her mouth.   Letting go of your breast by himself or herself. Signs from you:  Breasts that have increased in firmness, weight, and size 1-3 hours after feeding.   Breasts that are softer immediately after breastfeeding.  Increased milk volume, as well as a change in milk consistency and color by the fifth day of breastfeeding.   Nipples that are not sore,  cracked, or bleeding. Signs That Your Baby is Getting Enough Milk  Wetting at least 3 diapers in a 24-hour period. The urine should be clear and pale yellow by age 5 days.  At least 3 stools in a 24-hour period by age 5 days. The stool should be soft and yellow.  At least 3 stools in a 24-hour period by age 7 days. The stool should be seedy and yellow.  No loss of weight greater than 10% of birth weight during the first 3 days of age.  Average weight gain of 4-7 ounces (113-198 g) per week after age 4 days.  Consistent daily weight gain by age 5 days, without weight loss after the age of 2 weeks. After a feeding, your baby may spit up a small amount. This is common. BREASTFEEDING FREQUENCY AND DURATION Frequent feeding will help you make more milk and can prevent sore nipples and breast engorgement. Breastfeed when you feel the need to reduce the fullness of your breasts or when your baby shows signs of hunger. This is called "breastfeeding on demand." Avoid introducing a pacifier to your baby while you are working to establish breastfeeding (the first 4-6 weeks after your baby is born). After this time you may choose to use a pacifier. Research has shown that pacifier use during the first year of a baby's life decreases the risk of sudden infant death syndrome (SIDS). Allow your baby to feed on each breast as long as he or she wants. Breastfeed until your baby is finished feeding. When your baby unlatches or falls asleep while feeding from the first breast, offer the second breast. Because newborns are often sleepy in the first few weeks of life, you may need to awaken your baby to get him or her to feed. Breastfeeding times will vary from baby to baby. However, the following rules can serve as a guide to help you ensure that your baby is properly fed:  Newborns (babies 4 weeks of age or younger) may breastfeed every 1-3 hours.    Newborns should not go longer than 3 hours during the day or 5  hours during the night without breastfeeding.  You should breastfeed your baby a minimum of 8 times in a 24-hour period until you begin to introduce solid foods to your baby at around 6 months of age. BREAST MILK PUMPING Pumping and storing breast milk allows you to ensure that your baby is exclusively fed your breast milk, even at times when you are unable to breastfeed. This is especially important if you are going back to work while you are still breastfeeding or when you are not able to be present during feedings. Your lactation consultant can give you guidelines on how long it is safe to store breast milk.  A breast pump is a machine that allows you to pump milk from your breast into a sterile bottle. The pumped breast milk can then be stored in a refrigerator or freezer. Some breast pumps are operated by hand, while others use electricity. Ask your lactation consultant which type will work [place] for you. Breast pumps can be purchased, but some hospitals and breastfeeding support groups lease breast pumps on a monthly basis. A lactation consultant can teach you how to hand express breast milk, if you prefer not to use a pump.  CARING FOR YOUR BREASTS WHILE YOU BREASTFEED Nipples can become dry, cracked, and sore while breastfeeding. The following recommendations can help keep your breasts moisturized and healthy:  Avoid using soap on your nipples.   Wear a supportive bra. Although not required, special nursing bras and tank tops are designed to allow access to your breasts for breastfeeding without taking off your entire bra or top. Avoid wearing underwire-style bras or extremely tight bras.  Air dry your nipples for 3-4minutes after each feeding.   Use only cotton bra pads to absorb leaked breast milk. Leaking of breast milk between feedings is normal.   Use lanolin on your nipples after breastfeeding. Lanolin helps to maintain your skin's normal moisture barrier. If you use pure lanolin,  you do not need to wash it off before feeding your baby again. Pure lanolin is not toxic to your baby. You may also hand express a few drops of breast milk and gently massage that milk into your nipples and allow the milk to air dry. In the first few weeks after giving birth, some women experience extremely full breasts (engorgement). Engorgement can make your breasts feel heavy, warm, and tender to the touch. Engorgement peaks within 3-5 days after you give birth. The following recommendations can help ease engorgement:  Completely empty your breasts while breastfeeding or pumping. You may want to start by applying warm, moist heat (in the shower or with warm water-soaked hand towels) just before feeding or pumping. This increases circulation and helps the milk flow. If your baby does not completely empty your breasts while breastfeeding, pump any extra milk after he or she is finished.  Wear a snug bra (nursing or regular) or tank top for 1-2 days to signal your body to slightly decrease milk production.  Apply ice packs to your breasts, unless this is too uncomfortable for you.  Make sure that your baby is latched on and positioned properly while breastfeeding. If engorgement persists after 48 hours of following these recommendations, contact your health care provider or a lactation consultant. OVERALL HEALTH CARE RECOMMENDATIONS WHILE BREASTFEEDING  Eat healthy foods. Alternate between meals and snacks, eating 3 of each per day. Because what you eat affects your breast   milk, some of the foods may make your baby more irritable than usual. Avoid eating these foods if you are sure that they are negatively affecting your baby.  Drink milk, fruit juice, and water to satisfy your thirst (about 10 glasses a day).   Rest often, relax, and continue to take your prenatal vitamins to prevent fatigue, stress, and anemia.  Continue breast self-awareness checks.  Avoid chewing and smoking  tobacco.  Avoid alcohol and drug use. Some medicines that may be harmful to your baby can pass through breast milk. It is important to ask your health care provider before taking any medicine, including all over-the-counter and prescription medicine as well as vitamin and herbal supplements. It is possible to become pregnant while breastfeeding. If birth control is desired, ask your health care provider about options that will be safe for your baby. SEEK MEDICAL CARE IF:   You feel like you want to stop breastfeeding or have become frustrated with breastfeeding.  You have painful breasts or nipples.  Your nipples are cracked or bleeding.  Your breasts are red, tender, or warm.  You have a swollen area on either breast.  You have a fever or chills.  You have nausea or vomiting.  You have drainage other than breast milk from your nipples.  Your breasts do not become full before feedings by the fifth day after you give birth.  You feel sad and depressed.  Your baby is too sleepy to eat well.  Your baby is having trouble sleeping.   Your baby is wetting less than 3 diapers in a 24-hour period.  Your baby has less than 3 stools in a 24-hour period.  Your baby's skin or the white part of his or her eyes becomes yellow.   Your baby is not gaining weight by 5 days of age. SEEK IMMEDIATE MEDICAL CARE IF:   Your baby is overly tired (lethargic) and does not want to wake up and feed.  Your baby develops an unexplained fever. Document Released: 10/05/2005 Document Revised: 10/10/2013 Document Reviewed: 03/29/2013 Exit[employer] Patient Information 2015 [employer]. This information is not intended to replace advice given to you by your health care provider. Make sure you discuss any questions you have with your health care provider.  

## 2015-04-30 ENCOUNTER — Ambulatory Visit (INDEPENDENT_AMBULATORY_CARE_PROVIDER_SITE_OTHER): Payer: BLUE CROSS/BLUE SHIELD | Admitting: Family Medicine

## 2015-04-30 VITALS — BP 137/87 | HR 97 | Wt 326.0 lb

## 2015-04-30 DIAGNOSIS — O34219 Maternal care for unspecified type scar from previous cesarean delivery: Secondary | ICD-10-CM

## 2015-04-30 DIAGNOSIS — Z3483 Encounter for supervision of other normal pregnancy, third trimester: Secondary | ICD-10-CM

## 2015-04-30 DIAGNOSIS — O09293 Supervision of pregnancy with other poor reproductive or obstetric history, third trimester: Secondary | ICD-10-CM

## 2015-04-30 DIAGNOSIS — Z8632 Personal history of gestational diabetes: Secondary | ICD-10-CM

## 2015-04-30 DIAGNOSIS — Z3493 Encounter for supervision of normal pregnancy, unspecified, third trimester: Secondary | ICD-10-CM

## 2015-04-30 DIAGNOSIS — O3421 Maternal care for scar from previous cesarean delivery: Secondary | ICD-10-CM

## 2015-04-30 NOTE — Patient Instructions (Signed)
Breastfeeding Deciding to breastfeed is one of the [place] choices you can make for you and your baby. A change in hormones during pregnancy causes your breast tissue to grow and increases the number and size of your milk ducts. These hormones also allow proteins, sugars, and fats from your blood supply to make breast milk in your milk-producing glands. Hormones prevent breast milk from being released before your baby is born as well as prompt milk flow after birth. Once breastfeeding has begun, thoughts of your baby, as well as his or her sucking or crying, can stimulate the release of milk from your milk-producing glands.  BENEFITS OF BREASTFEEDING For Your Baby  Your first milk (colostrum) helps your baby's digestive system function better.   There are antibodies in your milk that help your baby fight off infections.   Your baby has a lower incidence of asthma, allergies, and sudden infant death syndrome.   The nutrients in breast milk are better for your baby than infant formulas and are designed uniquely for your baby's needs.   Breast milk improves your baby's brain development.   Your baby is less likely to develop other conditions, such as childhood obesity, asthma, or type 2 diabetes mellitus.  For You   Breastfeeding helps to create a very special bond between you and your baby.   Breastfeeding is convenient. Breast milk is always available at the correct temperature and costs nothing.   Breastfeeding helps to burn calories and helps you lose the weight gained during pregnancy.   Breastfeeding makes your uterus contract to its prepregnancy size faster and slows bleeding (lochia) after you give birth.   Breastfeeding helps to lower your risk of developing type 2 diabetes mellitus, osteoporosis, and breast or ovarian cancer later in life. SIGNS THAT YOUR BABY IS HUNGRY Early Signs of Hunger  Increased alertness or activity.  Stretching.  Movement of the head from  side to side.  Movement of the head and opening of the mouth when the corner of the mouth or cheek is stroked (rooting).  Increased sucking sounds, smacking lips, cooing, sighing, or squeaking.  Hand-to-mouth movements.  Increased sucking of fingers or hands. Late Signs of Hunger  Fussing.  Intermittent crying. Extreme Signs of Hunger Signs of extreme hunger will require calming and consoling before your baby will be able to breastfeed successfully. Do not wait for the following signs of extreme hunger to occur before you initiate breastfeeding:   Restlessness.  A loud, strong cry.   Screaming. BREASTFEEDING BASICS Breastfeeding Initiation  Find a comfortable place to sit or lie down, with your neck and back well supported.  Place a pillow or rolled up blanket under your baby to bring him or her to the level of your breast (if you are seated). Nursing pillows are specially designed to help support your arms and your baby while you breastfeed.  Make sure that your baby's abdomen is facing your abdomen.   Gently massage your breast. With your fingertips, massage from your chest wall toward your nipple in a circular motion. This encourages milk flow. You may need to continue this action during the feeding if your milk flows slowly.  Support your breast with 4 fingers underneath and your thumb above your nipple. Make sure your fingers are well away from your nipple and your baby's mouth.   Stroke your baby's lips gently with your finger or nipple.   When your baby's mouth is open wide enough, quickly bring your baby to your   breast, placing your entire nipple and as much of the colored area around your nipple (areola) as possible into your baby's mouth.   More areola should be visible above your baby's upper lip than below the lower lip.   Your baby's tongue should be between his or her lower gum and your breast.   Ensure that your baby's mouth is correctly positioned  around your nipple (latched). Your baby's lips should create a seal on your breast and be turned out (everted).  It is common for your baby to suck about 2-3 minutes in order to start the flow of breast milk. Latching Teaching your baby how to latch on to your breast properly is very important. An improper latch can cause nipple pain and decreased milk supply for you and poor weight gain in your baby. Also, if your baby is not latched onto your nipple properly, he or she may swallow some air during feeding. This can make your baby fussy. Burping your baby when you switch breasts during the feeding can help to get rid of the air. However, teaching your baby to latch on properly is still the [place] way to prevent fussiness from swallowing air while breastfeeding. Signs that your baby has successfully latched on to your nipple:    Silent tugging or silent sucking, without causing you pain.   Swallowing heard between every 3-4 sucks.    Muscle movement above and in front of his or her ears while sucking.  Signs that your baby has not successfully latched on to nipple:   Sucking sounds or smacking sounds from your baby while breastfeeding.  Nipple pain. If you think your baby has not latched on correctly, slip your finger into the corner of your baby's mouth to break the suction and place it between your baby's gums. Attempt breastfeeding initiation again. Signs of Successful Breastfeeding Signs from your baby:   A gradual decrease in the number of sucks or complete cessation of sucking.   Falling asleep.   Relaxation of his or her body.   Retention of a small amount of milk in his or her mouth.   Letting go of your breast by himself or herself. Signs from you:  Breasts that have increased in firmness, weight, and size 1-3 hours after feeding.   Breasts that are softer immediately after breastfeeding.  Increased milk volume, as well as a change in milk consistency and color by  the fifth day of breastfeeding.   Nipples that are not sore, cracked, or bleeding. Signs That Your Baby is Getting Enough Milk  Wetting at least 3 diapers in a 24-hour period. The urine should be clear and pale yellow by age 5 days.  At least 3 stools in a 24-hour period by age 5 days. The stool should be soft and yellow.  At least 3 stools in a 24-hour period by age 7 days. The stool should be seedy and yellow.  No loss of weight greater than 10% of birth weight during the first 3 days of age.  Average weight gain of 4-7 ounces (113-198 g) per week after age 4 days.  Consistent daily weight gain by age 5 days, without weight loss after the age of 2 weeks. After a feeding, your baby may spit up a small amount. This is common. BREASTFEEDING FREQUENCY AND DURATION Frequent feeding will help you make more milk and can prevent sore nipples and breast engorgement. Breastfeed when you feel the need to reduce the fullness of your breasts   or when your baby shows signs of hunger. This is called "breastfeeding on demand." Avoid introducing a pacifier to your baby while you are working to establish breastfeeding (the first 4-6 weeks after your baby is born). After this time you may choose to use a pacifier. Research has shown that pacifier use during the first year of a baby's life decreases the risk of sudden infant death syndrome (SIDS). Allow your baby to feed on each breast as long as he or she wants. Breastfeed until your baby is finished feeding. When your baby unlatches or falls asleep while feeding from the first breast, offer the second breast. Because newborns are often sleepy in the first few weeks of life, you may need to awaken your baby to get him or her to feed. Breastfeeding times will vary from baby to baby. However, the following rules can serve as a guide to help you ensure that your baby is properly fed:  Newborns (babies 4 weeks of age or younger) may breastfeed every 1-3  hours.  Newborns should not go longer than 3 hours during the day or 5 hours during the night without breastfeeding.  You should breastfeed your baby a minimum of 8 times in a 24-hour period until you begin to introduce solid foods to your baby at around 6 months of age. BREAST MILK PUMPING Pumping and storing breast milk allows you to ensure that your baby is exclusively fed your breast milk, even at times when you are unable to breastfeed. This is especially important if you are going back to work while you are still breastfeeding or when you are not able to be present during feedings. Your lactation consultant can give you guidelines on how long it is safe to store breast milk.  A breast pump is a machine that allows you to pump milk from your breast into a sterile bottle. The pumped breast milk can then be stored in a refrigerator or freezer. Some breast pumps are operated by hand, while others use electricity. Ask your lactation consultant which type will work [place] for you. Breast pumps can be purchased, but some hospitals and breastfeeding support groups lease breast pumps on a monthly basis. A lactation consultant can teach you how to hand express breast milk, if you prefer not to use a pump.  CARING FOR YOUR BREASTS WHILE YOU BREASTFEED Nipples can become dry, cracked, and sore while breastfeeding. The following recommendations can help keep your breasts moisturized and healthy:  Avoid using soap on your nipples.   Wear a supportive bra. Although not required, special nursing bras and tank tops are designed to allow access to your breasts for breastfeeding without taking off your entire bra or top. Avoid wearing underwire-style bras or extremely tight bras.  Air dry your nipples for 3-4minutes after each feeding.   Use only cotton bra pads to absorb leaked breast milk. Leaking of breast milk between feedings is normal.   Use lanolin on your nipples after breastfeeding. Lanolin helps to  maintain your skin's normal moisture barrier. If you use pure lanolin, you do not need to wash it off before feeding your baby again. Pure lanolin is not toxic to your baby. You may also hand express a few drops of breast milk and gently massage that milk into your nipples and allow the milk to air dry. In the first few weeks after giving birth, some women experience extremely full breasts (engorgement). Engorgement can make your breasts feel heavy, warm, and tender to the   touch. Engorgement peaks within 3-5 days after you give birth. The following recommendations can help ease engorgement:  Completely empty your breasts while breastfeeding or pumping. You may want to start by applying warm, moist heat (in the shower or with warm water-soaked hand towels) just before feeding or pumping. This increases circulation and helps the milk flow. If your baby does not completely empty your breasts while breastfeeding, pump any extra milk after he or she is finished.  Wear a snug bra (nursing or regular) or tank top for 1-2 days to signal your body to slightly decrease milk production.  Apply ice packs to your breasts, unless this is too uncomfortable for you.  Make sure that your baby is latched on and positioned properly while breastfeeding. If engorgement persists after 48 hours of following these recommendations, contact your health care provider or a lactation consultant. OVERALL HEALTH CARE RECOMMENDATIONS WHILE BREASTFEEDING  Eat healthy foods. Alternate between meals and snacks, eating 3 of each per day. Because what you eat affects your breast milk, some of the foods may make your baby more irritable than usual. Avoid eating these foods if you are sure that they are negatively affecting your baby.  Drink milk, fruit juice, and water to satisfy your thirst (about 10 glasses a day).   Rest often, relax, and continue to take your prenatal vitamins to prevent fatigue, stress, and anemia.  Continue  breast self-awareness checks.  Avoid chewing and smoking tobacco.  Avoid alcohol and drug use. Some medicines that may be harmful to your baby can pass through breast milk. It is important to ask your health care provider before taking any medicine, including all over-the-counter and prescription medicine as well as vitamin and herbal supplements. It is possible to become pregnant while breastfeeding. If birth control is desired, ask your health care provider about options that will be safe for your baby. SEEK MEDICAL CARE IF:   You feel like you want to stop breastfeeding or have become frustrated with breastfeeding.  You have painful breasts or nipples.  Your nipples are cracked or bleeding.  Your breasts are red, tender, or warm.  You have a swollen area on either breast.  You have a fever or chills.  You have nausea or vomiting.  You have drainage other than breast milk from your nipples.  Your breasts do not become full before feedings by the fifth day after you give birth.  You feel sad and depressed.  Your baby is too sleepy to eat well.  Your baby is having trouble sleeping.   Your baby is wetting less than 3 diapers in a 24-hour period.  Your baby has less than 3 stools in a 24-hour period.  Your baby's skin or the white part of his or her eyes becomes yellow.   Your baby is not gaining weight by 5 days of age. SEEK IMMEDIATE MEDICAL CARE IF:   Your baby is overly tired (lethargic) and does not want to wake up and feed.  Your baby develops an unexplained fever. Document Released: 10/05/2005 Document Revised: 10/10/2013 Document Reviewed: 03/29/2013 Exit[employer] Patient Information 2015 [employer]. This information is not intended to replace advice given to you by your health care provider. Make sure you discuss any questions you have with your health care provider.  

## 2015-04-30 NOTE — Progress Notes (Signed)
Subjective:  [name] is a 34 y.o. [id number] at 2638w5d being seen today for ongoing prenatal care.  Patient reports no complaints.  Contractions: Irregular.  Vag. Bleeding: None. Movement: Present. Denies leaking of fluid.   The following portions of the patient's history were reviewed and updated as appropriate: allergies, current medications, past family history, past medical history, past social history, past surgical history and problem list.   Objective:   Filed Vitals:   04/30/15 1334  BP: 137/87  Pulse: 97  Weight: 326 lb (147.873 kg)    Fetal Status: Fetal Heart Rate (bpm): 154 Fundal Height: 35 cm Movement: Present     General:  Alert, oriented and cooperative. Patient is in no acute distress.  Skin: Skin is warm and dry. No rash noted.   Cardiovascular: Normal heart rate noted  Respiratory: Normal respiratory effort, no problems with respiration noted  Abdomen: Soft, gravid, appropriate for gestational age. Pain/Pressure: Absent     Vaginal: Vag. Bleeding: None.    Vag D/C Character: Thin  Extremities: Normal range of motion.  Edema: Trace  Mental Status: Normal mood and affect. Normal behavior. Normal judgment and thought content.   Urinalysis: Urine Protein: Negative Urine Glucose: Negative FBS 108-116 2 hour pp < 120 all Assessment and Plan:  Pregnancy: [id number] at 4638w5d  1. Supervision of normal pregnancy, third trimester Continue routine prenatal care.  2. Previous cesarean delivery affecting pregnancy, antepartum For TOLAC  3. History of gestational diabetes in prior pregnancy, currently pregnant, third trimester Fasting hyperglycemia, she is only interested in diet, exercise and essential oils at this stage.   Preterm labor symptoms and general obstetric precautions including but not limited to vaginal bleeding, contractions, leaking of fluid and fetal movement were reviewed in detail with the patient.  Please refer to After Visit Summary for other  counseling recommendations.   Return in 2 weeks (on 05/14/2015).   Jackie Boresanya S Dasie Chancellor, MD

## 2015-04-30 NOTE — Progress Notes (Signed)
FBS remain elevated 2 hour pp are all normal

## 2015-05-06 ENCOUNTER — Telehealth: Payer: Self-pay | Admitting: *Deleted

## 2015-05-06 DIAGNOSIS — O24419 Gestational diabetes mellitus in pregnancy, unspecified control: Secondary | ICD-10-CM

## 2015-05-06 MED ORDER — GLYBURIDE 1.25 MG PO TABS: 1.2500 mg | ORAL_TABLET | Freq: Every day | ORAL | Status: DC

## 2015-05-06 NOTE — Telephone Encounter (Signed)
I have sent in prescription for patient to pharmacy,  I have made patient aware of NST twice weekly.  I have scheduled her first couple appointments.

## 2015-05-06 NOTE — Telephone Encounter (Signed)
-----   Message from Reva Boresanya S Pratt, MD sent at 05/03/2015  3:35 PM EDT ----- Regarding: RE: blood sugar I would start Glyburide 1.25 mg q hs--puts her in 2x/wkk testing--I am not there next week. ----- Message -----    From: Grayland Ormondhrissie C Lexxie Winberg, CMA    Sent: 05/03/2015   9:39 AM      To: Reva Boresanya S Pratt, MD Subject: blood sugar                                    Patient called and said her fasting blood sugars have been running around 120 in the morning x 4 days.  Patient wanted to know if you wanted to change anything or start her on Metformin?  Thanks  [phone]

## 2015-05-07 ENCOUNTER — Other Ambulatory Visit: Payer: BLUE CROSS/BLUE SHIELD

## 2015-05-09 ENCOUNTER — Telehealth: Payer: Self-pay | Admitting: *Deleted

## 2015-05-09 DIAGNOSIS — B379 Candidiasis, unspecified: Secondary | ICD-10-CM

## 2015-05-09 MED ORDER — FLUCONAZOLE 150 MG PO TABS: 150.0000 mg | ORAL_TABLET | Freq: Once | ORAL | Status: DC

## 2015-05-09 NOTE — Telephone Encounter (Signed)
Pt has yeast infection - sent Diflucan to pharmacy per pt req

## 2015-05-10 ENCOUNTER — Ambulatory Visit: Payer: BLUE CROSS/BLUE SHIELD | Admitting: *Deleted

## 2015-05-10 DIAGNOSIS — O24419 Gestational diabetes mellitus in pregnancy, unspecified control: Secondary | ICD-10-CM

## 2015-05-15 ENCOUNTER — Encounter: Payer: BLUE CROSS/BLUE SHIELD | Admitting: Family Medicine

## 2015-05-15 ENCOUNTER — Ambulatory Visit (INDEPENDENT_AMBULATORY_CARE_PROVIDER_SITE_OTHER): Payer: BLUE CROSS/BLUE SHIELD | Admitting: Certified Nurse Midwife

## 2015-05-15 VITALS — BP 142/89 | HR 98 | Wt 328.0 lb

## 2015-05-15 DIAGNOSIS — Z3483 Encounter for supervision of other normal pregnancy, third trimester: Secondary | ICD-10-CM

## 2015-05-15 DIAGNOSIS — O133 Gestational [pregnancy-induced] hypertension without significant proteinuria, third trimester: Secondary | ICD-10-CM

## 2015-05-15 NOTE — Patient Instructions (Signed)

## 2015-05-15 NOTE — Progress Notes (Signed)
Guidelines for Antenatal Testing and Sonography   INDICATION U/S NST/AFI DELIVERY  Diabetes   A1 - good control - 648.83    A2 - good control    Poor control or poor compliance    (Macrosomia or polyhydramnios)    B-C and A2/B - 648.03    D-R-F-T or poor control B-C  20-38  20-38  20-24-28-32-36   20-24-28-32-35-38//fetal echo  20-24-27-30-33-36-38//fetal echo  40  32//2 x wk  32//2 x wk   32//2 x wk   28//BPP wkly then 32//2 x wk  40  39  PRN   39   PRN  CHTN - 642.03   Group I   BP < 140/90, no preeclampsia, AGA,  nml AFV, +/- meds     Group II   BP > 140/90, on meds, no preeclampsia, AGA, nml AFV  20-28-34-38   20-24-28-32-35-38  32//2 x wk   28//BPP wkly then 32//2 x wk  40 no meds; 39 meds   PRN or 37  Pre-eclampsia  Mild  - 642.43/GHTN 642.33   Severe - 642.53  Q  3-4wks  Q 2 wks  28//BPP wkly then 32//2 x wk  Inpatient  37  PRN or 34  IUGR- 656.53   EFW < 10% w/ AEDF & low AFV or EFW < 3%     EFW < 10%, Nml Dopplers & AFV, AC<3%, no other comorbidities  PRN  20-24-28-30-32-34-36-38  Inpatient  24//BPP+dopplers wkly, then  32//2 x wk  PRN  PRN or 39  Graves Disease - 648.13 20-28-32-36 32//2 x wk 39  Multiple Gestation - 651.03       MC/DA    Concordant (< 20%) nml AFV, AGA       Discordant (>20%)               DC/DA  Concordant (<20%), nml AFV, AGA, no other comorbidities   Discordant (> 20%)     Q 2 wks 16-32, q 3wks 32-delivery Q 1 wk, Fluid alternating w/ growth  20-24-28-32-36  Q 2-3 wks   32//2 x wk  24//BPP wkly then 32//2 x wk   35//2 x wk  28//BPP wkly then 32//2 x wk   37-38  PRN   38  PRN or 37   Advanced maternal Age > 43 y.o. - 659.63 20-24-28-32-36 36//2 x wk 40  Previous Stillbirth (> 28 wks) - V23.5 20-24-28-32-36 28//BPP wkly then 32//2 x wk 39  Oligohydramnios - 658.03    AFV < 5, AGA, nml anatomy, no other comorbidities  Q 2 wks  28//BPP wkly then 32//2 x wk  PRN  or 37  Cholestasis - 646.73 At dx, then q 3 wks 28//BPP wkly then 32//2 x wk PRN or 37  Polyhydramnios - 657.03   Nml anatomy 20-24-28-32-36 28//BPP wkly then 32//2 x wk 39  Renal Disease - 646.23   Cr > 1.2, proteinuria 20-24-28-32-36 28//BPP wkly then 32//2 x wk 39  SLE (lupus) - 648.93 20-24-28-32-36 32//2 x wk 39  Sickle Cell Disease - 648.23 20-24-28-32-36 32//2 x wk 39  HIV - 042, 647.63 20-28-36 PRN 39  Decreased Fetal Movement- 655.73  2 x wk PRN; BPP wkly if < 32 wks   **2 x week testing = NST 2 x week + AFI weekly**   **If NST is nonreactive, will then need formal BPP** **PRN could be MD/MFM decision, or based on FLM or other criteria Subjective:  [name] is a 34 y.o. [id number] at  [redacted]w[redacted]d being seen today for ongoing prenatal care.  Patient reports no complaints.  Contractions: Not present.  Vag. Bleeding: None. Movement: Present. Denies leaking of fluid.  Discussed gestational hypertension and need for biweekly testing. Pt is not agreeable to that plan. She has agreed to come back on Friday for another NST. She reports that her Fasting BG in the past week have been 80-87. She randomy does AC Bg's but states they are in normal range. She is not taking her glyburide and is taking a vegetable based supplement " Ultimate Blood sugar support"  The following portions of the patient's history were reviewed and updated as appropriate: allergies, current medications, past family history, past medical history, past social history, past surgical history and problem list.   Objective:   Filed Vitals:   05/15/15 1342  BP: 142/89  Pulse: 98  Weight: 328 lb (148.78 kg)    Fetal Status:     Movement: Present     General:  Alert, oriented and cooperative. Patient is in no acute distress.  Skin: Skin is warm and dry. No rash noted.   Cardiovascular: Normal heart rate noted  Respiratory: Normal respiratory effort, no problems with respiration noted  Abdomen: Soft, gravid, appropriate for  gestational age. Pain/Pressure: Absent     Vaginal: Vag. Bleeding: None.    Vag D/C Character: Thin  Cervix: Not evaluated        Extremities: Normal range of motion.  Edema: Trace  Mental Status: Normal mood and affect. Normal behavior. Normal judgment and thought content.   Urinalysis: Urine Protein: Negative Urine Glucose: Negative  Assessment and Plan:  Pregnancy: [id number] at [redacted]w[redacted]d  1. Gestational hypertension w/o significant proteinuria in 3rd trimester   Preterm labor symptoms and general obstetric precautions including but not limited to vaginal bleeding, contractions, leaking of fluid and fetal movement were reviewed in detail with the patient. Please refer to After Visit Summary for other counseling recommendations.  Return in about 2 weeks (around 05/29/2015), or nst on Friday.   [name], CNM

## 2015-05-16 ENCOUNTER — Ambulatory Visit (INDEPENDENT_AMBULATORY_CARE_PROVIDER_SITE_OTHER): Payer: BLUE CROSS/BLUE SHIELD | Admitting: *Deleted

## 2015-05-16 VITALS — BP 134/88 | HR 94

## 2015-05-16 DIAGNOSIS — O09513 Supervision of elderly primigravida, third trimester: Secondary | ICD-10-CM

## 2015-05-16 DIAGNOSIS — O0993 Supervision of high risk pregnancy, unspecified, third trimester: Secondary | ICD-10-CM

## 2015-05-17 ENCOUNTER — Other Ambulatory Visit: Payer: BLUE CROSS/BLUE SHIELD

## 2015-05-17 ENCOUNTER — Encounter: Payer: BLUE CROSS/BLUE SHIELD | Admitting: Obstetrics & Gynecology

## 2015-05-17 ENCOUNTER — Telehealth: Payer: Self-pay | Admitting: *Deleted

## 2015-05-17 DIAGNOSIS — B379 Candidiasis, unspecified: Secondary | ICD-10-CM

## 2015-05-17 MED ORDER — NYSTATIN 100000 UNIT/GM EX CREA
1.0000 | TOPICAL_CREAM | Freq: Two times a day (BID) | CUTANEOUS | Status: DC
Start: 2015-05-17 — End: 2015-05-18

## 2015-05-17 NOTE — Telephone Encounter (Signed)
-----   Message from Pennie Banter sent at 05/17/2015 10:16 AM EDT ----- Regarding: rx Patient states she was supposed to have an Rx called in for her for Nystatin cream.  She states this was discussed at her last appointment, but it was not sent to her pharmacy.

## 2015-05-17 NOTE — Telephone Encounter (Signed)
I have sent in a prescription for Nystatin Cream to patients pharmacy.

## 2015-05-18 ENCOUNTER — Telehealth: Payer: Self-pay | Admitting: Certified Nurse Midwife

## 2015-05-18 DIAGNOSIS — B379 Candidiasis, unspecified: Secondary | ICD-10-CM

## 2015-05-18 MED ORDER — NYSTATIN 100000 UNIT/GM EX CREA: 1.0000 "application " | TOPICAL_CREAM | Freq: Two times a day (BID) | CUTANEOUS | Status: DC

## 2015-05-18 MED ORDER — NYSTATIN 100000 UNIT/GM EX POWD: CUTANEOUS | Status: DC

## 2015-05-23 ENCOUNTER — Ambulatory Visit (INDEPENDENT_AMBULATORY_CARE_PROVIDER_SITE_OTHER): Payer: BLUE CROSS/BLUE SHIELD | Admitting: Obstetrics & Gynecology

## 2015-05-23 ENCOUNTER — Ambulatory Visit (HOSPITAL_COMMUNITY)
Admission: RE | Admit: 2015-05-23 | Discharge: 2015-05-23 | Disposition: A | Discharge status: Home / Self Care | Payer: BLUE CROSS/BLUE SHIELD | Source: Ambulatory Visit | Attending: Obstetrics & Gynecology | Admitting: Obstetrics & Gynecology

## 2015-05-23 ENCOUNTER — Encounter (HOSPITAL_COMMUNITY): Payer: Self-pay

## 2015-05-23 VITALS — BP 139/91 | HR 101 | Wt 330.0 lb

## 2015-05-23 DIAGNOSIS — Z3493 Encounter for supervision of normal pregnancy, unspecified, third trimester: Secondary | ICD-10-CM

## 2015-05-23 DIAGNOSIS — E119 Type 2 diabetes mellitus without complications: Secondary | ICD-10-CM | POA: Insufficient documentation

## 2015-05-23 DIAGNOSIS — O34219 Maternal care for unspecified type scar from previous cesarean delivery: Secondary | ICD-10-CM

## 2015-05-23 DIAGNOSIS — O24419 Gestational diabetes mellitus in pregnancy, unspecified control: Secondary | ICD-10-CM | POA: Diagnosis not present

## 2015-05-23 DIAGNOSIS — O24313 Unspecified pre-existing diabetes mellitus in pregnancy, third trimester: Secondary | ICD-10-CM | POA: Insufficient documentation

## 2015-05-23 DIAGNOSIS — O403XX0 Polyhydramnios, third trimester, not applicable or unspecified: Secondary | ICD-10-CM | POA: Insufficient documentation

## 2015-05-23 DIAGNOSIS — O0993 Supervision of high risk pregnancy, unspecified, third trimester: Secondary | ICD-10-CM | POA: Diagnosis not present

## 2015-05-23 DIAGNOSIS — Z3A36 36 weeks gestation of pregnancy: Secondary | ICD-10-CM | POA: Diagnosis not present

## 2015-05-23 DIAGNOSIS — O3421 Maternal care for scar from previous cesarean delivery: Secondary | ICD-10-CM

## 2015-05-23 NOTE — Progress Notes (Signed)
Subjective:  [name] is a 34 y.o. [id number] at [redacted]w[redacted]d being seen today for ongoing prenatal care.  Patient reports no complaints. She reports FBS all less than 90 and 2 hours all less than 120.  Contractions: Not present.  Vag. Bleeding: None. Movement: Present. Denies leaking of fluid.   The following portions of the patient's history were reviewed and updated as appropriate: allergies, current medications, past family history, past medical history, past social history, past surgical history and problem list.   Objective:   Filed Vitals:   05/23/15 1129  BP: 139/91  Pulse: 101  Weight: 330 lb (149.687 kg)    Fetal Status: Fetal Heart Rate (bpm): NSTR   Movement: Present     General:  Alert, oriented and cooperative. Patient is in no acute distress.  Skin: Skin is warm and dry. No rash noted.   Cardiovascular: Normal heart rate noted  Respiratory: Normal respiratory effort, no problems with respiration noted  Abdomen: Soft, gravid, appropriate for gestational age. Pain/Pressure: Absent     Vaginal: Vag. Bleeding: None.    Vag D/C Character: Thin  Cervix: Not evaluated        Extremities: Normal range of motion.  Edema: Trace  Mental Status: Normal mood and affect. Normal behavior. Normal judgment and thought content.   Urinalysis: Urine Protein: Trace Urine Glucose: Negative  Assessment and Plan:  Pregnancy: [id number] at [redacted]w[redacted]d  1. Previous cesarean delivery affecting pregnancy, antepartum   2. Supervision of normal pregnancy, third trimester She wants to wait until next week for her cervical cultures.  Term labor symptoms and general obstetric precautions including but not limited to vaginal bleeding, contractions, leaking of fluid and fetal movement were reviewed in detail with the patient. Please refer to After Visit Summary for other counseling recommendations.  Return in about 1 week (around 05/30/2015) for cervical cultures.   Allie Bossier, MD

## 2015-05-23 NOTE — Progress Notes (Signed)
Pt requested to do GBS/CT cultures at next visit.

## 2015-05-27 NOTE — Telephone Encounter (Signed)
Med called in

## 2015-05-28 ENCOUNTER — Ambulatory Visit (INDEPENDENT_AMBULATORY_CARE_PROVIDER_SITE_OTHER): Payer: BLUE CROSS/BLUE SHIELD | Admitting: Family Medicine

## 2015-05-28 VITALS — BP 136/85 | HR 102 | Wt 329.8 lb

## 2015-05-28 DIAGNOSIS — O321XX1 Maternal care for breech presentation, fetus 1: Secondary | ICD-10-CM

## 2015-05-28 DIAGNOSIS — Z36 Encounter for antenatal screening of mother: Secondary | ICD-10-CM

## 2015-05-28 DIAGNOSIS — O3421 Maternal care for scar from previous cesarean delivery: Secondary | ICD-10-CM

## 2015-05-28 DIAGNOSIS — O24414 Gestational diabetes mellitus in pregnancy, insulin controlled: Secondary | ICD-10-CM

## 2015-05-28 DIAGNOSIS — Z3483 Encounter for supervision of other normal pregnancy, third trimester: Secondary | ICD-10-CM

## 2015-05-28 DIAGNOSIS — Z3493 Encounter for supervision of normal pregnancy, unspecified, third trimester: Secondary | ICD-10-CM

## 2015-05-28 DIAGNOSIS — O34219 Maternal care for unspecified type scar from previous cesarean delivery: Secondary | ICD-10-CM

## 2015-05-28 DIAGNOSIS — O09293 Supervision of pregnancy with other poor reproductive or obstetric history, third trimester: Secondary | ICD-10-CM

## 2015-05-28 DIAGNOSIS — Z8632 Personal history of gestational diabetes: Secondary | ICD-10-CM

## 2015-05-28 DIAGNOSIS — O321XX0 Maternal care for breech presentation, not applicable or unspecified: Secondary | ICD-10-CM | POA: Insufficient documentation

## 2015-05-28 DIAGNOSIS — O24419 Gestational diabetes mellitus in pregnancy, unspecified control: Secondary | ICD-10-CM

## 2015-05-28 LAB — OB RESULTS CONSOLE GBS: GBS: POSITIVE

## 2015-05-28 LAB — OB RESULTS CONSOLE GC/CHLAMYDIA
Chlamydia: NEGATIVE
Gonorrhea: NEGATIVE

## 2015-05-28 MED ORDER — GLYBURIDE 2.5 MG PO TABS: 2.5000 mg | ORAL_TABLET | Freq: Every day | ORAL | Status: DC

## 2015-05-28 NOTE — Patient Instructions (Addendum)
Breastfeeding Deciding to breastfeed is one of the [place] choices you can make for you and your baby. A change in hormones during pregnancy causes your breast tissue to grow and increases the number and size of your milk ducts. These hormones also allow proteins, sugars, and fats from your blood supply to make breast milk in your milk-producing glands. Hormones prevent breast milk from being released before your baby is born as well as prompt milk flow after birth. Once breastfeeding has begun, thoughts of your baby, as well as his or her sucking or crying, can stimulate the release of milk from your milk-producing glands.  BENEFITS OF BREASTFEEDING For Your Baby  Your first milk (colostrum) helps your baby's digestive system function better.   There are antibodies in your milk that help your baby fight off infections.   Your baby has a lower incidence of asthma, allergies, and sudden infant death syndrome.   The nutrients in breast milk are better for your baby than infant formulas and are designed uniquely for your baby's needs.   Breast milk improves your baby's brain development.   Your baby is less likely to develop other conditions, such as childhood obesity, asthma, or type 2 diabetes mellitus.  For You   Breastfeeding helps to create a very special bond between you and your baby.   Breastfeeding is convenient. Breast milk is always available at the correct temperature and costs nothing.   Breastfeeding helps to burn calories and helps you lose the weight gained during pregnancy.   Breastfeeding makes your uterus contract to its prepregnancy size faster and slows bleeding (lochia) after you give birth.   Breastfeeding helps to lower your risk of developing type 2 diabetes mellitus, osteoporosis, and breast or ovarian cancer later in life. SIGNS THAT YOUR BABY IS HUNGRY Early Signs of Hunger  Increased alertness or activity.  Stretching.  Movement of the head from  side to side.  Movement of the head and opening of the mouth when the corner of the mouth or cheek is stroked (rooting).  Increased sucking sounds, smacking lips, cooing, sighing, or squeaking.  Hand-to-mouth movements.  Increased sucking of fingers or hands. Late Signs of Hunger  Fussing.  Intermittent crying. Extreme Signs of Hunger Signs of extreme hunger will require calming and consoling before your baby will be able to breastfeed successfully. Do not wait for the following signs of extreme hunger to occur before you initiate breastfeeding:   Restlessness.  A loud, strong cry.   Screaming. BREASTFEEDING BASICS Breastfeeding Initiation  Find a comfortable place to sit or lie down, with your neck and back well supported.  Place a pillow or rolled up blanket under your baby to bring him or her to the level of your breast (if you are seated). Nursing pillows are specially designed to help support your arms and your baby while you breastfeed.  Make sure that your baby's abdomen is facing your abdomen.   Gently massage your breast. With your fingertips, massage from your chest wall toward your nipple in a circular motion. This encourages milk flow. You may need to continue this action during the feeding if your milk flows slowly.  Support your breast with 4 fingers underneath and your thumb above your nipple. Make sure your fingers are well away from your nipple and your baby's mouth.   Stroke your baby's lips gently with your finger or nipple.   When your baby's mouth is open wide enough, quickly bring your baby to your   breast, placing your entire nipple and as much of the colored area around your nipple (areola) as possible into your baby's mouth.   More areola should be visible above your baby's upper lip than below the lower lip.   Your baby's tongue should be between his or her lower gum and your breast.   Ensure that your baby's mouth is correctly positioned  around your nipple (latched). Your baby's lips should create a seal on your breast and be turned out (everted).  It is common for your baby to suck about 2-3 minutes in order to start the flow of breast milk. Latching Teaching your baby how to latch on to your breast properly is very important. An improper latch can cause nipple pain and decreased milk supply for you and poor weight gain in your baby. Also, if your baby is not latched onto your nipple properly, he or she may swallow some air during feeding. This can make your baby fussy. Burping your baby when you switch breasts during the feeding can help to get rid of the air. However, teaching your baby to latch on properly is still the [place] way to prevent fussiness from swallowing air while breastfeeding. Signs that your baby has successfully latched on to your nipple:    Silent tugging or silent sucking, without causing you pain.   Swallowing heard between every 3-4 sucks.    Muscle movement above and in front of his or her ears while sucking.  Signs that your baby has not successfully latched on to nipple:   Sucking sounds or smacking sounds from your baby while breastfeeding.  Nipple pain. If you think your baby has not latched on correctly, slip your finger into the corner of your baby's mouth to break the suction and place it between your baby's gums. Attempt breastfeeding initiation again. Signs of Successful Breastfeeding Signs from your baby:   A gradual decrease in the number of sucks or complete cessation of sucking.   Falling asleep.   Relaxation of his or her body.   Retention of a small amount of milk in his or her mouth.   Letting go of your breast by himself or herself. Signs from you:  Breasts that have increased in firmness, weight, and size 1-3 hours after feeding.   Breasts that are softer immediately after breastfeeding.  Increased milk volume, as well as a change in milk consistency and color by  the fifth day of breastfeeding.   Nipples that are not sore, cracked, or bleeding. Signs That Your Baby is Getting Enough Milk  Wetting at least 3 diapers in a 24-hour period. The urine should be clear and pale yellow by age 5 days.  At least 3 stools in a 24-hour period by age 5 days. The stool should be soft and yellow.  At least 3 stools in a 24-hour period by age 7 days. The stool should be seedy and yellow.  No loss of weight greater than 10% of birth weight during the first 3 days of age.  Average weight gain of 4-7 ounces (113-198 g) per week after age 4 days.  Consistent daily weight gain by age 5 days, without weight loss after the age of 2 weeks. After a feeding, your baby may spit up a small amount. This is common. BREASTFEEDING FREQUENCY AND DURATION Frequent feeding will help you make more milk and can prevent sore nipples and breast engorgement. Breastfeed when you feel the need to reduce the fullness of your breasts   or when your baby shows signs of hunger. This is called "breastfeeding on demand." Avoid introducing a pacifier to your baby while you are working to establish breastfeeding (the first 4-6 weeks after your baby is born). After this time you may choose to use a pacifier. Research has shown that pacifier use during the first year of a baby's life decreases the risk of sudden infant death syndrome (SIDS). Allow your baby to feed on each breast as long as he or she wants. Breastfeed until your baby is finished feeding. When your baby unlatches or falls asleep while feeding from the first breast, offer the second breast. Because newborns are often sleepy in the first few weeks of life, you may need to awaken your baby to get him or her to feed. Breastfeeding times will vary from baby to baby. However, the following rules can serve as a guide to help you ensure that your baby is properly fed:  Newborns (babies 4 weeks of age or younger) may breastfeed every 1-3  hours.  Newborns should not go longer than 3 hours during the day or 5 hours during the night without breastfeeding.  You should breastfeed your baby a minimum of 8 times in a 24-hour period until you begin to introduce solid foods to your baby at around 6 months of age. BREAST MILK PUMPING Pumping and storing breast milk allows you to ensure that your baby is exclusively fed your breast milk, even at times when you are unable to breastfeed. This is especially important if you are going back to work while you are still breastfeeding or when you are not able to be present during feedings. Your lactation consultant can give you guidelines on how long it is safe to store breast milk.  A breast pump is a machine that allows you to pump milk from your breast into a sterile bottle. The pumped breast milk can then be stored in a refrigerator or freezer. Some breast pumps are operated by hand, while others use electricity. Ask your lactation consultant which type will work [place] for you. Breast pumps can be purchased, but some hospitals and breastfeeding support groups lease breast pumps on a monthly basis. A lactation consultant can teach you how to hand express breast milk, if you prefer not to use a pump.  CARING FOR YOUR BREASTS WHILE YOU BREASTFEED Nipples can become dry, cracked, and sore while breastfeeding. The following recommendations can help keep your breasts moisturized and healthy:  Avoid using soap on your nipples.   Wear a supportive bra. Although not required, special nursing bras and tank tops are designed to allow access to your breasts for breastfeeding without taking off your entire bra or top. Avoid wearing underwire-style bras or extremely tight bras.  Air dry your nipples for 3-4minutes after each feeding.   Use only cotton bra pads to absorb leaked breast milk. Leaking of breast milk between feedings is normal.   Use lanolin on your nipples after breastfeeding. Lanolin helps to  maintain your skin's normal moisture barrier. If you use pure lanolin, you do not need to wash it off before feeding your baby again. Pure lanolin is not toxic to your baby. You may also hand express a few drops of breast milk and gently massage that milk into your nipples and allow the milk to air dry. In the first few weeks after giving birth, some women experience extremely full breasts (engorgement). Engorgement can make your breasts feel heavy, warm, and tender to the   touch. Engorgement peaks within 3-5 days after you give birth. The following recommendations can help ease engorgement:  Completely empty your breasts while breastfeeding or pumping. You may want to start by applying warm, moist heat (in the shower or with warm water-soaked hand towels) just before feeding or pumping. This increases circulation and helps the milk flow. If your baby does not completely empty your breasts while breastfeeding, pump any extra milk after he or she is finished.  Wear a snug bra (nursing or regular) or tank top for 1-2 days to signal your body to slightly decrease milk production.  Apply ice packs to your breasts, unless this is too uncomfortable for you.  Make sure that your baby is latched on and positioned properly while breastfeeding. If engorgement persists after 48 hours of following these recommendations, contact your health care provider or a Science writer. OVERALL HEALTH CARE RECOMMENDATIONS WHILE BREASTFEEDING  Eat healthy foods. Alternate between meals and snacks, eating 3 of each per day. Because what you eat affects your breast milk, some of the foods may make your baby more irritable than usual. Avoid eating these foods if you are sure that they are negatively affecting your baby.  Drink milk, fruit juice, and water to satisfy your thirst (about 10 glasses a day).   Rest often, relax, and continue to take your prenatal vitamins to prevent fatigue, stress, and anemia.  Continue  breast self-awareness checks.  Avoid chewing and smoking tobacco.  Avoid alcohol and drug use. Some medicines that may be harmful to your baby can pass through breast milk. It is important to ask your health care provider before taking any medicine, including all over-the-counter and prescription medicine as well as vitamin and herbal supplements. It is possible to become pregnant while breastfeeding. If birth control is desired, ask your health care provider about options that will be safe for your baby. SEEK MEDICAL CARE IF:   You feel like you want to stop breastfeeding or have become frustrated with breastfeeding.  You have painful breasts or nipples.  Your nipples are cracked or bleeding.  Your breasts are red, tender, or warm.  You have a swollen area on either breast.  You have a fever or chills.  You have nausea or vomiting.  You have drainage other than breast milk from your nipples.  Your breasts do not become full before feedings by the fifth day after you give birth.  You feel sad and depressed.  Your baby is too sleepy to eat well.  Your baby is having trouble sleeping.   Your baby is wetting less than 3 diapers in a 24-hour period.  Your baby has less than 3 stools in a 24-hour period.  Your baby's skin or the white part of his or her eyes becomes yellow.   Your baby is not gaining weight by 55 days of age. SEEK IMMEDIATE MEDICAL CARE IF:   Your baby is overly tired (lethargic) and does not want to wake up and feed.  Your baby develops an unexplained fever. Document Released: 10/05/2005 Document Revised: 10/10/2013 Document Reviewed: 03/29/2013 [facility] Patient Information 2015 [place], [place]. This information is not intended to replace advice given to you by your health care provider. Make sure you discuss any questions you have with your health care provider.  Vaginal Birth After Cesarean Delivery Vaginal birth after cesarean delivery (VBAC) is  giving birth vaginally after previously delivering a baby by a cesarean. In the past, if a woman had a cesarean delivery,  all births afterward would be done by cesarean delivery. This is no longer true. It can be safe for the mother to try a vaginal delivery after having a cesarean delivery.  It is important to discuss VBAC with your health care provider early in the pregnancy so you can understand the risks, benefits, and options. It will give you time to decide what is [place] in your particular case. The final decision about whether to have a VBAC or repeat cesarean delivery should be between you and your health care provider. Any changes in your health or your baby's health during your pregnancy may make it necessary to change your initial decision about VBAC.  WOMEN WHO PLAN TO HAVE A VBAC SHOULD CHECK WITH THEIR HEALTH CARE PROVIDER TO BE SURE THAT:  The previous cesarean delivery was done with a low transverse uterine cut (incision) (not a vertical classical incision).   The birth canal is big enough for the baby.   There were no other operations on the uterus.   An electronic fetal monitor (EFM) will be on at all times during labor.   An operating room will be available and ready in case an emergency cesarean delivery is needed.   A health care provider and surgical nursing staff will be available at all times during labor to be ready to do an emergency delivery cesarean if necessary.   An anesthesiologist will be present in case an emergency cesarean delivery is needed.   The nursery is prepared and has adequate personnel and necessary equipment available to care for the baby in case of an emergency cesarean delivery. BENEFITS OF VBAC  Shorter stay in the hospital.   Avoidance of risks associated with cesarean delivery, such as:  Surgical complications, such as opening of the incision or hernia in the incision.  Injury to other organs.  Fever. This can occur if an infection  develops after surgery. It can also occur as a reaction to the medicine given to make you numb during the surgery.  Less blood loss and need for blood transfusions.  Lower risk of blood clots and infection.  Shorter recovery.   Decreased risk for having to remove the uterus (hysterectomy).   Decreased risk for the placenta to completely or partially cover the opening of the uterus (placenta previa) with a future pregnancy.   Decrease risk in future labor and delivery. RISKS OF A VBAC  Tearing (rupture) of the uterus. This is occurs in less than 1% of VBACs. The risk of this happening is higher if:  Steps are taken to begin the labor process (induce labor) or stimulate or strengthen contractions (augment labor).   Medicine is used to soften (ripen) the cervix.  Having to remove the uterus (hysterectomy) if it ruptures. VBAC SHOULD NOT BE DONE IF:  The previous cesarean delivery was done with a vertical (classical) or T-shaped incision or you do not know what kind of incision was made.   You had a ruptured uterus.   You have had certain types of surgery on your uterus, such as removal of uterine fibroids. Ask your health care provider about other types of surgeries that prevent you from having a VBAC.  You have certain medical or childbirth (obstetrical) problems.   There are problems with the baby.   You have had two previous cesarean deliveries and no vaginal deliveries. OTHER FACTS TO KNOW ABOUT VBAC:  It is safe to have an epidural anesthetic with VBAC.   It is safe to   turn the baby from a breech position (attempt an external cephalic version).   It is safe to try a VBAC with twins.   VBAC may not be successful if your baby weights 8.8 lb (4 kg) or more. However, weight predictions are not always accurate and should not be used alone to decide if VBAC is right for you.  There is an increased failure rate if the time between the cesarean delivery and VBAC is  less than 19 months.   Your health care provider may advise against a VBAC if you have preeclampsia (high blood pressure, protein in the urine, and swelling of face and extremities).   VBAC is often successful if you previously gave birth vaginally.   VBAC is often successful when the labor starts spontaneously before the due date.   Delivering a baby through a VBAC is similar to having a normal spontaneous vaginal delivery. Document Released: 03/28/2007 Document Revised: 02/19/2014 Document Reviewed: 05/04/2013 [facility] Patient Information 2015 [place], [place]. This information is not intended to replace advice given to you by your health care provider. Make sure you discuss any questions you have with your health care provider. External Cephalic Version External cephalic version is turning a baby that is presenting his or her buttocks first (breech) or is lying sideways in the uterus (transverse) to a head-first position. This makes the labor and delivery faster, safer for the mother and baby, and lessens the chance for a cesarean section. It should not be tried until the pregnancy is [redacted] weeks along or longer. BEFORE THE PROCEDURE   Do not take aspirin.  Do not eat for 4 hours before the procedure.  Tell your caregiver if you have a cold, fever, or an infection.  Tell your caregiver if you are having contractions.  Tell your caregiver if you are leaking or had a gush of fluid from your vagina.  Tell your caregiver if you have any vaginal bleeding or abnormal discharge.  If you are being admitted the same day, arrive at the hospital at least one hour before the procedure to sign any necessary documents and to get prepared for the procedure.  Tell your caregiver if you had any problems with anesthetics in the past.  Tell your caregiver if you are taking any medications that your caregiver does not know about. This includes over-the-counter and prescription drugs, herbs, eye drops  and creams. PROCEDURE  First, an ultrasound is done to make sure the baby is breech or transverse.  A non-stress test or biophysical profile is done on the baby before the ECV. This is done to make sure it is safe for the baby to have the ECV. It may also be done after the procedure to make sure the baby is okay.  ECV is done in the delivery/surgical room with an anesthesiologist present. There should be a setup for an emergency cesarean section with a full nursing and nursery staff available and ready.  The patient may be given a medication to relax the uterine muscles. An epidural may be given for any discomfort. It is helpful for the success of the ECV.  An electronic fetal monitor is placed on the uterus during the procedure to make sure the baby is okay.  If the mother is Rh-negative, Rho (D) immune globulin will be given to her to prevent Rh problems for future pregnancies.  The mother is followed closely for 2 to 3 hours after the procedure to make sure no problems develop. BENEFITS OF ECV  Easier and safer labor and delivery for the mother and baby.  Lower incidence of cesarean section.  Lower costs with a vaginal delivery. RISKS OF ECV  The placenta pulls away from the wall of the uterus before delivery (abruption of the placenta).  Rupture of the uterus, especially in patients with a previous cesarean section.  Fetal distress.  Early (premature) labor.  Premature rupture of the membranes.  The baby will return to the breech or transverse lie position.  Death of the fetus can happen but is very rare. ECV SHOULD BE STOPPED IF:  The fetal heart tones drop.  The mother is having a lot of pain.  You cannot turn the baby after several attempts. ECV SHOULD NOT BE DONE IF:  The non-stress test or biophysical profile is abnormal.  There is vaginal bleeding.  An abnormal shaped uterus is present.  There is heart disease or uncontrolled high blood pressure in the  mother.  There are twins or more.  The placenta covers the opening of the cervix (placenta previa).  You had a previous cesarean section with a classical incision or major surgery of the uterus.  There is not enough amniotic fluid in the sac (oligohydramnios).  The baby is too small for the pregnancy or has not developed normally (anomaly).  Your membranes have ruptured. HOME CARE INSTRUCTIONS   Have someone take you home after the procedure.  Rest at home for several hours.  Have someone stay with you for a few hours after you get home.  After ECV, continue with your prenatal visits as directed.  Continue your regular diet, rest and activities.  Do not do any strenuous activities for a couple of days. SEEK IMMEDIATE MEDICAL CARE IF:   You develop vaginal bleeding.  You have fluid coming out of your vagina (bag of water may have broken).  You develop uterine contractions.  You do not feel the baby move or there is less movement of the baby.  You develop abdominal pain.  You develop an oral temperature of 102 F (38.9 C) or higher. Document Released: 03/30/2007 Document Revised: 02/19/2014 Document Reviewed: 01/23/2009 [facility] Patient Information 2015 [place], [place]. This information is not intended to replace advice given to you by your health care provider. Make sure you discuss any questions you have with your health care provider.

## 2015-05-28 NOTE — Progress Notes (Signed)
Subjective:  [name] is a 34 y.o. [id number] at [redacted]w[redacted]d being seen today for ongoing prenatal care.  Patient reports no complaints.  Contractions: Irregular.  Vag. Bleeding: None. Movement: (!) Decreased. Denies leaking of fluid.   The following portions of the patient's history were reviewed and updated as appropriate: allergies, current medications, past family history, past medical history, past social history, past surgical history and problem list.   Objective:   Filed Vitals:   05/28/15 1457  BP: 136/85  Pulse: 102  Weight: 329 lb 12.8 oz (149.596 kg)    Fetal Status:   Fundal Height: 43 cm Movement: (!) Decreased  Presentation: Complete Breech  General:  Alert, oriented and cooperative. Patient is in no acute distress.  Skin: Skin is warm and dry. No rash noted.   Cardiovascular: Normal heart rate noted  Respiratory: Normal respiratory effort, no problems with respiration noted  Abdomen: Soft, gravid, appropriate for gestational age. Pain/Pressure: Absent     Pelvic: Vag. Bleeding: None Vag D/C Character: Thin   Cervical exam performed Dilation: 1 Effacement (%): 50 Station: -2  Extremities: Normal range of motion.  Edema: Mild pitting, slight indentation  Mental Status: Normal mood and affect. Normal behavior. Normal judgment and thought content.   Urinalysis: Urine Protein: Negative Urine Glucose: Negative NST reviewed and reactive. Has started Glyburide 1.25 mg but FBS remain in the 100 range. Assessment and Plan:  Pregnancy: [id number] at [redacted]w[redacted]d  1. Supervision of normal pregnancy, third trimester Continue routine prenatal care.  - GC/Chlamydia Probe Amp - Culture, beta strep (group b only)  2. Previous cesarean delivery affecting pregnancy, antepartum  For TOLAC  3. History of gestational diabetes in prior pregnancy, currently pregnant, third trimester Increase Glyburide to 2.5 mg q hs Delivery by 39 wks 2x/wk testing  4. Breech presentation, antepartum,  fetus 1 For ECV on Friday 8/12  5. Gestational diabetes mellitus in third trimester, unspecified diabetic control  - glyBURIDE (DIABETA) 2.5 MG tablet; Take 1 tablet (2.5 mg total) by mouth at bedtime.  Dispense: 30 tablet; Refill: 3  Term labor symptoms and general obstetric precautions including but not limited to vaginal bleeding, contractions, leaking of fluid and fetal movement were reviewed in detail with the patient. Please refer to After Visit Summary for other counseling recommendations.  Return in 1 week (on 06/04/2015) for OB visit and NST.   Reva Bores, MD

## 2015-05-29 LAB — GC/CHLAMYDIA PROBE AMP
CT Probe RNA: NEGATIVE
GC Probe RNA: NEGATIVE

## 2015-05-29 LAB — CULTURE, BETA STREP (GROUP B ONLY)

## 2015-05-30 ENCOUNTER — Encounter: Payer: Self-pay | Admitting: Family Medicine

## 2015-05-30 DIAGNOSIS — O99820 Streptococcus B carrier state complicating pregnancy: Secondary | ICD-10-CM | POA: Insufficient documentation

## 2015-05-31 ENCOUNTER — Inpatient Hospital Stay (HOSPITAL_COMMUNITY)
Admission: AD | Admit: 2015-05-31 | Discharge: 2015-05-31 | Disposition: A | Discharge status: Home / Self Care | Payer: BLUE CROSS/BLUE SHIELD | Source: Ambulatory Visit | Attending: Family Medicine | Admitting: Family Medicine

## 2015-05-31 DIAGNOSIS — Z3A37 37 weeks gestation of pregnancy: Secondary | ICD-10-CM | POA: Insufficient documentation

## 2015-05-31 DIAGNOSIS — Z87442 Personal history of urinary calculi: Secondary | ICD-10-CM | POA: Insufficient documentation

## 2015-05-31 DIAGNOSIS — O24419 Gestational diabetes mellitus in pregnancy, unspecified control: Secondary | ICD-10-CM | POA: Diagnosis not present

## 2015-05-31 DIAGNOSIS — O321XX0 Maternal care for breech presentation, not applicable or unspecified: Secondary | ICD-10-CM | POA: Diagnosis not present

## 2015-05-31 DIAGNOSIS — O321XX1 Maternal care for breech presentation, fetus 1: Secondary | ICD-10-CM | POA: Diagnosis not present

## 2015-05-31 NOTE — Discharge Instructions (Signed)

## 2015-05-31 NOTE — MAU Note (Signed)
Bedside sono by Dr Shawnie Pons-  Baby is vertex

## 2015-05-31 NOTE — MAU Provider Note (Signed)
[name] is an 34 y.o. [id number].   Chief Complaint: Breech in the office HPI: Patient here for ECV.  Past Medical History  Diagnosis Date  . Anxiety   . Pregnancy induced hypertension     labetolol  . Kidney stones 2013    13 passed  . Gestational diabetes 2008 and 2010  . Preterm labor     delivery    Past Surgical History  Procedure Laterality Date  . Cesarean section  2006  . Wisdom tooth extraction      x4    Family History  Problem Relation Age of Onset  . Schizophrenia Maternal Grandmother   . Atrial fibrillation Mother   . Depression Mother   . Anxiety disorder Mother    Social History:  reports that she has never smoked. She has never used smokeless tobacco. She reports that she does not drink alcohol or use illicit drugs.   Allergies  Allergen Reactions  . Ivp Dye [Iodinated Diagnostic Agents] Anaphylaxis  . Phenergan Fortis [Promethazine] Itching    Itching/pins and needles for hours.    No current facility-administered medications on file prior to encounter.   Current Outpatient Prescriptions on File Prior to Encounter  Medication Sig Dispense Refill  . ACCU-CHEK FASTCLIX LANCETS MISC 1 Units by Percutaneous route 4 (four) times daily. 100 each 12  . ACCU-CHEK FASTCLIX LANCETS MISC Check glucose twice daily. 60 each 6  . Blood Glucose Monitoring Suppl (ACCU-CHEK AVIVA PLUS) W/DEVICE KIT 1 kit by Does not apply route once. 1 kit 0  . Blood Glucose Monitoring Suppl (ACCU-CHEK NANO SMARTVIEW) W/DEVICE KIT 1 Device by Does not apply route as directed. Check fasting, and two hours after breakfast, lunch and dinner 1 kit 0  . cholecalciferol (VITAMIN D) 1000 UNITS tablet Take 1,000 Units by mouth 2 (two) times daily.    .Marland Kitchenglucose blood (ACCU-CHEK AVIVA PLUS) test strip Use as instructed 100 each 12  . glucose blood (ACCU-CHEK SMARTVIEW) test strip Check blood sugars 4x/daily 100 each 12  . glyBURIDE (DIABETA) 2.5 MG tablet Take 1 tablet (2.5  mg total) by mouth at bedtime. (Patient taking differently: Take 5 mg by mouth at bedtime. ) 30 tablet 3  . nystatin cream (MYCOSTATIN) Apply 1 application topically 2 (two) times daily. 30 g 1  . Prenatal Multivit-Min-Fe-FA (PRENATAL VITAMINS PO) Take by mouth.    . fluconazole (DIFLUCAN) 150 MG tablet Take 1 tablet (150 mg total) by mouth once. Can take additional dose three days later if symptoms persist (Patient not taking: Reported on 05/23/2015) 1 tablet 3  . nystatin (MYCOSTATIN/NYSTOP) 100000 UNIT/GM POWD Apply to affected area 3 times a day (Patient not taking: Reported on 05/23/2015) 30 g 0    A comprehensive review of systems was negative.  Blood pressure 176/75, pulse 106, temperature 98.8 F (37.1 C), temperature source Oral, resp. rate 20, last menstrual period 09/13/2014, not currently breastfeeding. BP 176/75 mmHg  Pulse 106  Temp(Src) 98.8 F (37.1 C) (Oral)  Resp 20  LMP 09/13/2014 General appearance: alert, cooperative and appears stated age Neck: supple, symmetrical, trachea midline Lungs: normal effort Heart: regular rate and rhythm Abdomen: gravid, NT Extremities: extremities normal, atraumatic, no cyanosis or edema Skin: Skin color, texture, turgor normal. No rashes or lesions   Lab Results  Component Value Date   WBC 8.7 11/27/2014   HGB 13.3 11/27/2014   HCT 40.7 11/27/2014   MCV 87.3 11/27/2014   PLT 250 11/27/2014  ABO, Rh: B/POS/-- (02/09 1454)  Antibody: NEG (02/09 1454)  Rubella:    RPR: NON REAC (02/09 1454)  HBsAg: NEGATIVE (02/09 1454)  HIV: NONREACTIVE (02/09 1454)  GBS:       Assessment/Plan  Principal Problem:   Breech presentation, antepartum Active Problems:   Gestational diabetes mellitus, antepartum  For ECV.  MAU course- Bedside u/s done and infant noted to be vertex.  NST completed and reactive. BP re-evaluated and WNL. OK for discharge.   PRATT,TANYA S 05/31/2015, 9:40 AM

## 2015-05-31 NOTE — MAU Note (Signed)
Here for version

## 2015-06-03 ENCOUNTER — Ambulatory Visit (INDEPENDENT_AMBULATORY_CARE_PROVIDER_SITE_OTHER): Payer: BLUE CROSS/BLUE SHIELD | Admitting: Family Medicine

## 2015-06-03 ENCOUNTER — Other Ambulatory Visit: Payer: BLUE CROSS/BLUE SHIELD

## 2015-06-03 VITALS — BP 143/71 | HR 98 | Wt 330.0 lb

## 2015-06-03 DIAGNOSIS — O24414 Gestational diabetes mellitus in pregnancy, insulin controlled: Secondary | ICD-10-CM | POA: Diagnosis not present

## 2015-06-03 DIAGNOSIS — Z3483 Encounter for supervision of other normal pregnancy, third trimester: Secondary | ICD-10-CM

## 2015-06-03 DIAGNOSIS — O24419 Gestational diabetes mellitus in pregnancy, unspecified control: Secondary | ICD-10-CM

## 2015-06-03 DIAGNOSIS — Z3493 Encounter for supervision of normal pregnancy, unspecified, third trimester: Secondary | ICD-10-CM

## 2015-06-03 NOTE — Patient Instructions (Signed)
Preeclampsia and Eclampsia Preeclampsia is a serious condition that develops only during pregnancy. It is also called toxemia of pregnancy. This condition causes high blood pressure along with other symptoms, such as swelling and headaches. These may develop as the condition gets worse. Preeclampsia may occur 20 weeks or later into your pregnancy.  Diagnosing and treating preeclampsia early is very important. If not treated early, it can cause serious problems for you and your baby. One problem it can lead to is eclampsia, which is a condition that causes muscle jerking or shaking (convulsions) in the mother. Delivering your baby is the [place] treatment for preeclampsia or eclampsia.  RISK FACTORS The cause of preeclampsia is not known. You may be more likely to develop preeclampsia if you have certain risk factors. These include:   Being pregnant for the first time.  Having preeclampsia in a past pregnancy.  Having a family history of preeclampsia.  Having high blood pressure.  Being pregnant with twins or triplets.  Being 35 or older.  Being African American.  Having kidney disease or diabetes.  Having medical conditions such as lupus or blood diseases.  Being very overweight (obese). SIGNS AND SYMPTOMS  The earliest signs of preeclampsia are:  High blood pressure.  Increased protein in your urine. Your health care provider will check for this at every prenatal visit. Other symptoms that can develop include:   Severe headaches.  Sudden weight gain.  Swelling of your hands, face, legs, and feet.  Feeling sick to your stomach (nauseous) and throwing up (vomiting).  Vision problems (blurred or double vision).  Numbness in your face, arms, legs, and feet.  Dizziness.  Slurred speech.  Sensitivity to bright lights.  Abdominal pain. DIAGNOSIS  There are no screening tests for preeclampsia. Your health care provider will ask you about symptoms and check for signs of  preeclampsia during your prenatal visits. You may also have tests, including:  Urine testing.  Blood testing.  Checking your baby's heart rate.  Checking the health of your baby and your placenta using images created with sound waves (ultrasound). TREATMENT  You can work out the [place] treatment approach together with your health care provider. It is very important to keep all prenatal appointments. If you have an increased risk of preeclampsia, you may need more frequent prenatal exams.  Your health care provider may prescribe bed rest.  You may have to eat as little salt as possible.  You may need to take medicine to lower your blood pressure if the condition does not respond to more conservative measures.  You may need to stay in the hospital if your condition is severe. There, treatment will focus on controlling your blood pressure and fluid retention. You may also need to take medicine to prevent seizures.  If the condition gets worse, your baby may need to be delivered early to protect you and the baby. You may have your labor started with medicine (be induced), or you may have a cesarean delivery.  Preeclampsia usually goes away after the baby is born. HOME CARE INSTRUCTIONS   Only take over-the-counter or prescription medicines as directed by your health care provider.  Lie on your left side while resting. This keeps pressure off your baby.  Elevate your feet while resting.  Get regular exercise. Ask your health care provider what type of exercise is safe for you.  Avoid caffeine and alcohol.  Do not smoke.  Drink 6-8 glasses of water every day.  Eat a balanced diet   that is low in salt. Do not add salt to your food.  Avoid stressful situations as much as possible.  Get plenty of rest and sleep.  Keep all prenatal appointments and tests as scheduled. SEEK MEDICAL CARE IF:  You are gaining more weight than expected.  You have any headaches, abdominal pain, or  nausea.  You are bruising more than usual.  You feel dizzy or light-headed. SEEK IMMEDIATE MEDICAL CARE IF:   You develop sudden or severe swelling anywhere in your body. This usually happens in the legs.  You gain 5 lb (2.3 kg) or more in a week.  You have a severe headache, dizziness, problems with your vision, or confusion.  You have severe abdominal pain.  You have lasting nausea or vomiting.  You have a seizure.  You have trouble moving any part of your body.  You develop numbness in your body.  You have trouble speaking.  You have any abnormal bleeding.  You develop a stiff neck.  You pass out. MAKE SURE YOU:   Understand these instructions.  Will watch your condition.  Will get help right away if you are not doing well or get worse. Document Released: 10/02/2000 Document Revised: 10/10/2013 Document Reviewed: 07/28/2013 Exit[employer] Patient Information 2015 [employer]. This information is not intended to replace advice given to you by your health care provider. Make sure you discuss any questions you have with your health care provider.  Breastfeeding Deciding to breastfeed is one of the [place] choices you can make for you and your baby. A change in hormones during pregnancy causes your breast tissue to grow and increases the number and size of your milk ducts. These hormones also allow proteins, sugars, and fats from your blood supply to make breast milk in your milk-producing glands. Hormones prevent breast milk from being released before your baby is born as well as prompt milk flow after birth. Once breastfeeding has begun, thoughts of your baby, as well as his or her sucking or crying, can stimulate the release of milk from your milk-producing glands.  BENEFITS OF BREASTFEEDING For Your Baby  Your first milk (colostrum) helps your baby's digestive system function better.   There are antibodies in your milk that help your baby fight off infections.   Your  baby has a lower incidence of asthma, allergies, and sudden infant death syndrome.   The nutrients in breast milk are better for your baby than infant formulas and are designed uniquely for your baby's needs.   Breast milk improves your baby's brain development.   Your baby is less likely to develop other conditions, such as childhood obesity, asthma, or type 2 diabetes mellitus.  For You   Breastfeeding helps to create a very special bond between you and your baby.   Breastfeeding is convenient. Breast milk is always available at the correct temperature and costs nothing.   Breastfeeding helps to burn calories and helps you lose the weight gained during pregnancy.   Breastfeeding makes your uterus contract to its prepregnancy size faster and slows bleeding (lochia) after you give birth.   Breastfeeding helps to lower your risk of developing type 2 diabetes mellitus, osteoporosis, and breast or ovarian cancer later in life. SIGNS THAT YOUR BABY IS HUNGRY Early Signs of Hunger  Increased alertness or activity.  Stretching.  Movement of the head from side to side.  Movement of the head and opening of the mouth when the corner of the mouth or cheek is stroked (rooting).    Increased sucking sounds, smacking lips, cooing, sighing, or squeaking.  Hand-to-mouth movements.  Increased sucking of fingers or hands. Late Signs of Hunger  Fussing.  Intermittent crying. Extreme Signs of Hunger Signs of extreme hunger will require calming and consoling before your baby will be able to breastfeed successfully. Do not wait for the following signs of extreme hunger to occur before you initiate breastfeeding:   Restlessness.  A loud, strong cry.   Screaming. BREASTFEEDING BASICS Breastfeeding Initiation  Find a comfortable place to sit or lie down, with your neck and back well supported.  Place a pillow or rolled up blanket under your baby to bring him or her to the level of  your breast (if you are seated). Nursing pillows are specially designed to help support your arms and your baby while you breastfeed.  Make sure that your baby's abdomen is facing your abdomen.   Gently massage your breast. With your fingertips, massage from your chest wall toward your nipple in a circular motion. This encourages milk flow. You may need to continue this action during the feeding if your milk flows slowly.  Support your breast with 4 fingers underneath and your thumb above your nipple. Make sure your fingers are well away from your nipple and your baby's mouth.   Stroke your baby's lips gently with your finger or nipple.   When your baby's mouth is open wide enough, quickly bring your baby to your breast, placing your entire nipple and as much of the colored area around your nipple (areola) as possible into your baby's mouth.   More areola should be visible above your baby's upper lip than below the lower lip.   Your baby's tongue should be between his or her lower gum and your breast.   Ensure that your baby's mouth is correctly positioned around your nipple (latched). Your baby's lips should create a seal on your breast and be turned out (everted).  It is common for your baby to suck about 2-3 minutes in order to start the flow of breast milk. Latching Teaching your baby how to latch on to your breast properly is very important. An improper latch can cause nipple pain and decreased milk supply for you and poor weight gain in your baby. Also, if your baby is not latched onto your nipple properly, he or she may swallow some air during feeding. This can make your baby fussy. Burping your baby when you switch breasts during the feeding can help to get rid of the air. However, teaching your baby to latch on properly is still the [place] way to prevent fussiness from swallowing air while breastfeeding. Signs that your baby has successfully latched on to your nipple:    Silent  tugging or silent sucking, without causing you pain.   Swallowing heard between every 3-4 sucks.    Muscle movement above and in front of his or her ears while sucking.  Signs that your baby has not successfully latched on to nipple:   Sucking sounds or smacking sounds from your baby while breastfeeding.  Nipple pain. If you think your baby has not latched on correctly, slip your finger into the corner of your baby's mouth to break the suction and place it between your baby's gums. Attempt breastfeeding initiation again. Signs of Successful Breastfeeding Signs from your baby:   A gradual decrease in the number of sucks or complete cessation of sucking.   Falling asleep.   Relaxation of his or her body.     Retention of a small amount of milk in his or her mouth.   Letting go of your breast by himself or herself. Signs from you:  Breasts that have increased in firmness, weight, and size 1-3 hours after feeding.   Breasts that are softer immediately after breastfeeding.  Increased milk volume, as well as a change in milk consistency and color by the fifth day of breastfeeding.   Nipples that are not sore, cracked, or bleeding. Signs That Your Baby is Getting Enough Milk  Wetting at least 3 diapers in a 24-hour period. The urine should be clear and pale yellow by age 5 days.  At least 3 stools in a 24-hour period by age 5 days. The stool should be soft and yellow.  At least 3 stools in a 24-hour period by age 7 days. The stool should be seedy and yellow.  No loss of weight greater than 10% of birth weight during the first 3 days of age.  Average weight gain of 4-7 ounces (113-198 g) per week after age 4 days.  Consistent daily weight gain by age 5 days, without weight loss after the age of 2 weeks. After a feeding, your baby may spit up a small amount. This is common. BREASTFEEDING FREQUENCY AND DURATION Frequent feeding will help you make more milk and can prevent  sore nipples and breast engorgement. Breastfeed when you feel the need to reduce the fullness of your breasts or when your baby shows signs of hunger. This is called "breastfeeding on demand." Avoid introducing a pacifier to your baby while you are working to establish breastfeeding (the first 4-6 weeks after your baby is born). After this time you may choose to use a pacifier. Research has shown that pacifier use during the first year of a baby's life decreases the risk of sudden infant death syndrome (SIDS). Allow your baby to feed on each breast as long as he or she wants. Breastfeed until your baby is finished feeding. When your baby unlatches or falls asleep while feeding from the first breast, offer the second breast. Because newborns are often sleepy in the first few weeks of life, you may need to awaken your baby to get him or her to feed. Breastfeeding times will vary from baby to baby. However, the following rules can serve as a guide to help you ensure that your baby is properly fed:  Newborns (babies 4 weeks of age or younger) may breastfeed every 1-3 hours.  Newborns should not go longer than 3 hours during the day or 5 hours during the night without breastfeeding.  You should breastfeed your baby a minimum of 8 times in a 24-hour period until you begin to introduce solid foods to your baby at around 6 months of age. BREAST MILK PUMPING Pumping and storing breast milk allows you to ensure that your baby is exclusively fed your breast milk, even at times when you are unable to breastfeed. This is especially important if you are going back to work while you are still breastfeeding or when you are not able to be present during feedings. Your lactation consultant can give you guidelines on how long it is safe to store breast milk.  A breast pump is a machine that allows you to pump milk from your breast into a sterile bottle. The pumped breast milk can then be stored in a refrigerator or freezer.  Some breast pumps are operated by hand, while others use electricity. Ask your lactation consultant which type   will work [place] for you. Breast pumps can be purchased, but some hospitals and breastfeeding support groups lease breast pumps on a monthly basis. A lactation consultant can teach you how to hand express breast milk, if you prefer not to use a pump.  CARING FOR YOUR BREASTS WHILE YOU BREASTFEED Nipples can become dry, cracked, and sore while breastfeeding. The following recommendations can help keep your breasts moisturized and healthy:  Avoid using soap on your nipples.   Wear a supportive bra. Although not required, special nursing bras and tank tops are designed to allow access to your breasts for breastfeeding without taking off your entire bra or top. Avoid wearing underwire-style bras or extremely tight bras.  Air dry your nipples for 3-4minutes after each feeding.   Use only cotton bra pads to absorb leaked breast milk. Leaking of breast milk between feedings is normal.   Use lanolin on your nipples after breastfeeding. Lanolin helps to maintain your skin's normal moisture barrier. If you use pure lanolin, you do not need to wash it off before feeding your baby again. Pure lanolin is not toxic to your baby. You may also hand express a few drops of breast milk and gently massage that milk into your nipples and allow the milk to air dry. In the first few weeks after giving birth, some women experience extremely full breasts (engorgement). Engorgement can make your breasts feel heavy, warm, and tender to the touch. Engorgement peaks within 3-5 days after you give birth. The following recommendations can help ease engorgement:  Completely empty your breasts while breastfeeding or pumping. You may want to start by applying warm, moist heat (in the shower or with warm water-soaked hand towels) just before feeding or pumping. This increases circulation and helps the milk flow. If your baby  does not completely empty your breasts while breastfeeding, pump any extra milk after he or she is finished.  Wear a snug bra (nursing or regular) or tank top for 1-2 days to signal your body to slightly decrease milk production.  Apply ice packs to your breasts, unless this is too uncomfortable for you.  Make sure that your baby is latched on and positioned properly while breastfeeding. If engorgement persists after 48 hours of following these recommendations, contact your health care provider or a lactation consultant. OVERALL HEALTH CARE RECOMMENDATIONS WHILE BREASTFEEDING  Eat healthy foods. Alternate between meals and snacks, eating 3 of each per day. Because what you eat affects your breast milk, some of the foods may make your baby more irritable than usual. Avoid eating these foods if you are sure that they are negatively affecting your baby.  Drink milk, fruit juice, and water to satisfy your thirst (about 10 glasses a day).   Rest often, relax, and continue to take your prenatal vitamins to prevent fatigue, stress, and anemia.  Continue breast self-awareness checks.  Avoid chewing and smoking tobacco.  Avoid alcohol and drug use. Some medicines that may be harmful to your baby can pass through breast milk. It is important to ask your health care provider before taking any medicine, including all over-the-counter and prescription medicine as well as vitamin and herbal supplements. It is possible to become pregnant while breastfeeding. If birth control is desired, ask your health care provider about options that will be safe for your baby. SEEK MEDICAL CARE IF:   You feel like you want to stop breastfeeding or have become frustrated with breastfeeding.  You have painful breasts or nipples.  Your   nipples are cracked or bleeding.  Your breasts are red, tender, or warm.  You have a swollen area on either breast.  You have a fever or chills.  You have nausea or  vomiting.  You have drainage other than breast milk from your nipples.  Your breasts do not become full before feedings by the fifth day after you give birth.  You feel sad and depressed.  Your baby is too sleepy to eat well.  Your baby is having trouble sleeping.   Your baby is wetting less than 3 diapers in a 24-hour period.  Your baby has less than 3 stools in a 24-hour period.  Your baby's skin or the white part of his or her eyes becomes yellow.   Your baby is not gaining weight by 5 days of age. SEEK IMMEDIATE MEDICAL CARE IF:   Your baby is overly tired (lethargic) and does not want to wake up and feed.  Your baby develops an unexplained fever. Document Released: 10/05/2005 Document Revised: 10/10/2013 Document Reviewed: 03/29/2013 ExitCare Patient Information 2015 [employer]. This information is not intended to replace advice given to you by your health care provider. Make sure you discuss any questions you have with your health care provider.  

## 2015-06-03 NOTE — Progress Notes (Signed)
Subjective:  [name] is a 34 y.o. [id number] at 101w4d being seen today for ongoing prenatal care.  Patient reports poor fetal movement yesterday, but back to normal today.  Contractions: Irregular.  Vag. Bleeding: None. Movement: Present. Denies leaking of fluid.   The following portions of the patient's history were reviewed and updated as appropriate: allergies, current medications, past family history, past medical history, past social history, past surgical history and problem list.   Objective:   Filed Vitals:   06/03/15 1328  BP: 143/71  Pulse: 98  Weight: 330 lb (149.687 kg)    Fetal Status: Fetal Heart Rate (bpm): NST   Movement: Present  Presentation: Vertex  General:  Alert, oriented and cooperative. Patient is in no acute distress.  Skin: Skin is warm and dry. No rash noted.   Cardiovascular: Normal heart rate noted  Respiratory: Normal respiratory effort, no problems with respiration noted  Abdomen: Soft, gravid, appropriate for gestational age. Pain/Pressure: Present     Pelvic: Vag. Bleeding: None Vag D/C Character: Thin   Cervical exam performed 2 cm ext os/1 cm internal os/thick/ high       Extremities: Normal range of motion.  Edema: Mild pitting, slight indentation  Mental Status: Normal mood and affect. Normal behavior. Normal judgment and thought content.   Urinalysis: Urine Protein: Trace Urine Glucose: Negative NST reviewed and reactive. FBS < 80 on medication Assessment and Plan:  Pregnancy: [id number] at [redacted]w[redacted]d  1. Supervision of normal pregnancy, third trimester Continue routine prenatal care.   2. Gestational diabetes mellitus, antepartum Continue Glyburide q hs  Term labor symptoms and general obstetric precautions including but not limited to vaginal bleeding, contractions, leaking of fluid and fetal movement were reviewed in detail with the patient. Please refer to After Visit Summary for other counseling recommendations.  Return in 1 week (on  06/10/2015).   Reva Bores, MD

## 2015-06-07 ENCOUNTER — Inpatient Hospital Stay (HOSPITAL_COMMUNITY): Admission: RE | Admit: 2015-06-07 | Payer: BLUE CROSS/BLUE SHIELD | Source: Ambulatory Visit

## 2015-06-07 ENCOUNTER — Ambulatory Visit (INDEPENDENT_AMBULATORY_CARE_PROVIDER_SITE_OTHER): Payer: BLUE CROSS/BLUE SHIELD | Admitting: *Deleted

## 2015-06-07 VITALS — BP 144/84 | HR 100

## 2015-06-07 DIAGNOSIS — O24410 Gestational diabetes mellitus in pregnancy, diet controlled: Secondary | ICD-10-CM

## 2015-06-07 DIAGNOSIS — O98813 Other maternal infectious and parasitic diseases complicating pregnancy, third trimester: Secondary | ICD-10-CM

## 2015-06-07 NOTE — Progress Notes (Signed)
Pt c/o vaginal itching and burning, feels she has a yeast infection due to being in the pool often.  Also pt feels the baby may have shifted position and wants to check and see if baby is breech. 1-day Tioconazole Ointment 6.5% vaginal treatment given to patient for yeast infection per Dr Macon Large.

## 2015-06-07 NOTE — Progress Notes (Signed)
Pt here for twice weekly testing. NST - reactive. AFI 22.5cm. Presentation - Vertex seen on bedside US.

## 2015-06-07 NOTE — Progress Notes (Signed)
NST performed today was reviewed and was found to be reactive.  AFI normal at 22.5 cm.  Continue recommended antenatal testing and prenatal care.

## 2015-06-10 ENCOUNTER — Ambulatory Visit (INDEPENDENT_AMBULATORY_CARE_PROVIDER_SITE_OTHER): Payer: BLUE CROSS/BLUE SHIELD | Admitting: Family Medicine

## 2015-06-10 VITALS — BP 145/85 | HR 100 | Wt 334.0 lb

## 2015-06-10 DIAGNOSIS — O24419 Gestational diabetes mellitus in pregnancy, unspecified control: Secondary | ICD-10-CM

## 2015-06-10 DIAGNOSIS — Z22330 Carrier of Group B streptococcus: Secondary | ICD-10-CM

## 2015-06-10 DIAGNOSIS — O24410 Gestational diabetes mellitus in pregnancy, diet controlled: Secondary | ICD-10-CM

## 2015-06-10 DIAGNOSIS — O99820 Streptococcus B carrier state complicating pregnancy: Secondary | ICD-10-CM

## 2015-06-10 DIAGNOSIS — Z3493 Encounter for supervision of normal pregnancy, unspecified, third trimester: Secondary | ICD-10-CM

## 2015-06-10 NOTE — Patient Instructions (Signed)
Third Trimester of Pregnancy The third trimester is from week 29 through week 42, months 7 through 9. The third trimester is a time when the fetus is growing rapidly. At the end of the ninth month, the fetus is about 20 inches in length and weighs 6-10 pounds.  BODY CHANGES Your body goes through many changes during pregnancy. The changes vary from woman to woman.   Your weight will continue to increase. You can expect to gain 25-35 pounds (11-16 kg) by the end of the pregnancy.  You may begin to get stretch marks on your hips, abdomen, and breasts.  You may urinate more often because the fetus is moving lower into your pelvis and pressing on your bladder.  You may develop or continue to have heartburn as a result of your pregnancy.  You may develop constipation because certain hormones are causing the muscles that push waste through your intestines to slow down.  You may develop hemorrhoids or swollen, bulging veins (varicose veins).  You may have pelvic pain because of the weight gain and pregnancy hormones relaxing your joints between the bones in your pelvis. Backaches may result from overexertion of the muscles supporting your posture.  You may have changes in your hair. These can include thickening of your hair, rapid growth, and changes in texture. Some women also have hair loss during or after pregnancy, or hair that feels dry or thin. Your hair will most likely return to normal after your baby is born.  Your breasts will continue to grow and be tender. A yellow discharge may leak from your breasts called colostrum.  Your belly button may stick out.  You may feel short of breath because of your expanding uterus.  You may notice the fetus "dropping," or moving lower in your abdomen.  You may have a bloody mucus discharge. This usually occurs a few days to a week before labor begins.  Your cervix becomes thin and soft (effaced) near your due date. WHAT TO EXPECT AT YOUR  PRENATAL EXAMS  You will have prenatal exams every 2 weeks until week 36. Then, you will have weekly prenatal exams. During a routine prenatal visit:  You will be weighed to make sure you and the fetus are growing normally.  Your blood pressure is taken.  Your abdomen will be measured to track your baby's growth.  The fetal heartbeat will be listened to.  Any test results from the previous visit will be discussed.  You may have a cervical check near your due date to see if you have effaced. At around 36 weeks, your caregiver will check your cervix. At the same time, your caregiver will also perform a test on the secretions of the vaginal tissue. This test is to determine if a type of bacteria, Group B streptococcus, is present. Your caregiver will explain this further. Your caregiver may ask you:  What your birth plan is.  How you are feeling.  If you are feeling the baby move.  If you have had any abnormal symptoms, such as leaking fluid, bleeding, severe headaches, or abdominal cramping.  If you have any questions. Other tests or screenings that may be performed during your third trimester include:  Blood tests that check for low iron levels (anemia).  Fetal testing to check the health, activity level, and growth of the fetus. Testing is done if you have certain medical conditions or if there are problems during the pregnancy. FALSE LABOR You may feel small, irregular contractions that   eventually go away. These are called Braxton Hicks contractions, or false labor. Contractions may last for hours, days, or even weeks before true labor sets in. If contractions come at regular intervals, intensify, or become painful, it is [place] to be seen by your caregiver.  SIGNS OF LABOR   Menstrual-like cramps.  Contractions that are 5 minutes apart or less.  Contractions that start on the top of the uterus and spread down to the lower abdomen and back.  A sense of increased pelvic  pressure or back pain.  A watery or bloody mucus discharge that comes from the vagina. If you have any of these signs before the 37th week of pregnancy, call your caregiver right away. You need to go to the hospital to get checked immediately. HOME CARE INSTRUCTIONS   Avoid all smoking, herbs, alcohol, and unprescribed drugs. These chemicals affect the formation and growth of the baby.  Follow your caregiver's instructions regarding medicine use. There are medicines that are either safe or unsafe to take during pregnancy.  Exercise only as directed by your caregiver. Experiencing uterine cramps is a good sign to stop exercising.  Continue to eat regular, healthy meals.  Wear a good support bra for breast tenderness.  Do not use hot tubs, steam rooms, or saunas.  Wear your seat belt at all times when driving.  Avoid raw meat, uncooked cheese, cat litter boxes, and soil used by cats. These carry germs that can cause birth defects in the baby.  Take your prenatal vitamins.  Try taking a stool softener (if your caregiver approves) if you develop constipation. Eat more high-fiber foods, such as fresh vegetables or fruit and whole grains. Drink plenty of fluids to keep your urine clear or pale yellow.  Take warm sitz baths to soothe any pain or discomfort caused by hemorrhoids. Use hemorrhoid cream if your caregiver approves.  If you develop varicose veins, wear support hose. Elevate your feet for 15 minutes, 3-4 times a day. Limit salt in your diet.  Avoid heavy lifting, wear low heal shoes, and practice good posture.  Rest a lot with your legs elevated if you have leg cramps or low back pain.  Visit your dentist if you have not gone during your pregnancy. Use a soft toothbrush to brush your teeth and be gentle when you floss.  A sexual relationship may be continued unless your caregiver directs you otherwise.  Do not travel far distances unless it is absolutely necessary and only  with the approval of your caregiver.  Take prenatal classes to understand, practice, and ask questions about the labor and delivery.  Make a trial run to the hospital.  Pack your hospital bag.  Prepare the baby's nursery.  Continue to go to all your prenatal visits as directed by your caregiver. SEEK MEDICAL CARE IF:  You are unsure if you are in labor or if your water has broken.  You have dizziness.  You have mild pelvic cramps, pelvic pressure, or nagging pain in your abdominal area.  You have persistent nausea, vomiting, or diarrhea.  You have a bad smelling vaginal discharge.  You have pain with urination. SEEK IMMEDIATE MEDICAL CARE IF:   You have a fever.  You are leaking fluid from your vagina.  You have spotting or bleeding from your vagina.  You have severe abdominal cramping or pain.  You have rapid weight loss or gain.  You have shortness of breath with chest pain.  You notice sudden or extreme swelling   of your face, hands, ankles, feet, or legs.  You have not felt your baby move in over an hour.  You have severe headaches that do not go away with medicine.  You have vision changes. Document Released: 09/29/2001 Document Revised: 10/10/2013 Document Reviewed: 12/06/2012 Exit[employer] Patient Information 2015 [employer]. This information is not intended to replace advice given to you by your health care provider. Make sure you discuss any questions you have with your health care provider.  Breastfeeding Deciding to breastfeed is one of the [place] choices you can make for you and your baby. A change in hormones during pregnancy causes your breast tissue to grow and increases the number and size of your milk ducts. These hormones also allow proteins, sugars, and fats from your blood supply to make breast milk in your milk-producing glands. Hormones prevent breast milk from being released before your baby is born as well as prompt milk flow after birth. Once  breastfeeding has begun, thoughts of your baby, as well as his or her sucking or crying, can stimulate the release of milk from your milk-producing glands.  BENEFITS OF BREASTFEEDING For Your Baby  Your first milk (colostrum) helps your baby's digestive system function better.   There are antibodies in your milk that help your baby fight off infections.   Your baby has a lower incidence of asthma, allergies, and sudden infant death syndrome.   The nutrients in breast milk are better for your baby than infant formulas and are designed uniquely for your baby's needs.   Breast milk improves your baby's brain development.   Your baby is less likely to develop other conditions, such as childhood obesity, asthma, or type 2 diabetes mellitus.  For You   Breastfeeding helps to create a very special bond between you and your baby.   Breastfeeding is convenient. Breast milk is always available at the correct temperature and costs nothing.   Breastfeeding helps to burn calories and helps you lose the weight gained during pregnancy.   Breastfeeding makes your uterus contract to its prepregnancy size faster and slows bleeding (lochia) after you give birth.   Breastfeeding helps to lower your risk of developing type 2 diabetes mellitus, osteoporosis, and breast or ovarian cancer later in life. SIGNS THAT YOUR BABY IS HUNGRY Early Signs of Hunger  Increased alertness or activity.  Stretching.  Movement of the head from side to side.  Movement of the head and opening of the mouth when the corner of the mouth or cheek is stroked (rooting).  Increased sucking sounds, smacking lips, cooing, sighing, or squeaking.  Hand-to-mouth movements.  Increased sucking of fingers or hands. Late Signs of Hunger  Fussing.  Intermittent crying. Extreme Signs of Hunger Signs of extreme hunger will require calming and consoling before your baby will be able to breastfeed successfully. Do not  wait for the following signs of extreme hunger to occur before you initiate breastfeeding:   Restlessness.  A loud, strong cry.   Screaming. BREASTFEEDING BASICS Breastfeeding Initiation  Find a comfortable place to sit or lie down, with your neck and back well supported.  Place a pillow or rolled up blanket under your baby to bring him or her to the level of your breast (if you are seated). Nursing pillows are specially designed to help support your arms and your baby while you breastfeed.  Make sure that your baby's abdomen is facing your abdomen.   Gently massage your breast. With your fingertips, massage from your chest   wall toward your nipple in a circular motion. This encourages milk flow. You may need to continue this action during the feeding if your milk flows slowly.  Support your breast with 4 fingers underneath and your thumb above your nipple. Make sure your fingers are well away from your nipple and your baby's mouth.   Stroke your baby's lips gently with your finger or nipple.   When your baby's mouth is open wide enough, quickly bring your baby to your breast, placing your entire nipple and as much of the colored area around your nipple (areola) as possible into your baby's mouth.   More areola should be visible above your baby's upper lip than below the lower lip.   Your baby's tongue should be between his or her lower gum and your breast.   Ensure that your baby's mouth is correctly positioned around your nipple (latched). Your baby's lips should create a seal on your breast and be turned out (everted).  It is common for your baby to suck about 2-3 minutes in order to start the flow of breast milk. Latching Teaching your baby how to latch on to your breast properly is very important. An improper latch can cause nipple pain and decreased milk supply for you and poor weight gain in your baby. Also, if your baby is not latched onto your nipple properly, he or she  may swallow some air during feeding. This can make your baby fussy. Burping your baby when you switch breasts during the feeding can help to get rid of the air. However, teaching your baby to latch on properly is still the [place] way to prevent fussiness from swallowing air while breastfeeding. Signs that your baby has successfully latched on to your nipple:    Silent tugging or silent sucking, without causing you pain.   Swallowing heard between every 3-4 sucks.    Muscle movement above and in front of his or her ears while sucking.  Signs that your baby has not successfully latched on to nipple:   Sucking sounds or smacking sounds from your baby while breastfeeding.  Nipple pain. If you think your baby has not latched on correctly, slip your finger into the corner of your baby's mouth to break the suction and place it between your baby's gums. Attempt breastfeeding initiation again. Signs of Successful Breastfeeding Signs from your baby:   A gradual decrease in the number of sucks or complete cessation of sucking.   Falling asleep.   Relaxation of his or her body.   Retention of a small amount of milk in his or her mouth.   Letting go of your breast by himself or herself. Signs from you:  Breasts that have increased in firmness, weight, and size 1-3 hours after feeding.   Breasts that are softer immediately after breastfeeding.  Increased milk volume, as well as a change in milk consistency and color by the fifth day of breastfeeding.   Nipples that are not sore, cracked, or bleeding. Signs That Your Baby is Getting Enough Milk  Wetting at least 3 diapers in a 24-hour period. The urine should be clear and pale yellow by age 5 days.  At least 3 stools in a 24-hour period by age 5 days. The stool should be soft and yellow.  At least 3 stools in a 24-hour period by age 7 days. The stool should be seedy and yellow.  No loss of weight greater than 10% of birth weight  during the first 3   days of age.  Average weight gain of 4-7 ounces (113-198 g) per week after age 4 days.  Consistent daily weight gain by age 5 days, without weight loss after the age of 2 weeks. After a feeding, your baby may spit up a small amount. This is common. BREASTFEEDING FREQUENCY AND DURATION Frequent feeding will help you make more milk and can prevent sore nipples and breast engorgement. Breastfeed when you feel the need to reduce the fullness of your breasts or when your baby shows signs of hunger. This is called "breastfeeding on demand." Avoid introducing a pacifier to your baby while you are working to establish breastfeeding (the first 4-6 weeks after your baby is born). After this time you may choose to use a pacifier. Research has shown that pacifier use during the first year of a baby's life decreases the risk of sudden infant death syndrome (SIDS). Allow your baby to feed on each breast as long as he or she wants. Breastfeed until your baby is finished feeding. When your baby unlatches or falls asleep while feeding from the first breast, offer the second breast. Because newborns are often sleepy in the first few weeks of life, you may need to awaken your baby to get him or her to feed. Breastfeeding times will vary from baby to baby. However, the following rules can serve as a guide to help you ensure that your baby is properly fed:  Newborns (babies 4 weeks of age or younger) may breastfeed every 1-3 hours.  Newborns should not go longer than 3 hours during the day or 5 hours during the night without breastfeeding.  You should breastfeed your baby a minimum of 8 times in a 24-hour period until you begin to introduce solid foods to your baby at around 6 months of age. BREAST MILK PUMPING Pumping and storing breast milk allows you to ensure that your baby is exclusively fed your breast milk, even at times when you are unable to breastfeed. This is especially important if you are  going back to work while you are still breastfeeding or when you are not able to be present during feedings. Your lactation consultant can give you guidelines on how long it is safe to store breast milk.  A breast pump is a machine that allows you to pump milk from your breast into a sterile bottle. The pumped breast milk can then be stored in a refrigerator or freezer. Some breast pumps are operated by hand, while others use electricity. Ask your lactation consultant which type will work [place] for you. Breast pumps can be purchased, but some hospitals and breastfeeding support groups lease breast pumps on a monthly basis. A lactation consultant can teach you how to hand express breast milk, if you prefer not to use a pump.  CARING FOR YOUR BREASTS WHILE YOU BREASTFEED Nipples can become dry, cracked, and sore while breastfeeding. The following recommendations can help keep your breasts moisturized and healthy:  Avoid using soap on your nipples.   Wear a supportive bra. Although not required, special nursing bras and tank tops are designed to allow access to your breasts for breastfeeding without taking off your entire bra or top. Avoid wearing underwire-style bras or extremely tight bras.  Air dry your nipples for 3-4minutes after each feeding.   Use only cotton bra pads to absorb leaked breast milk. Leaking of breast milk between feedings is normal.   Use lanolin on your nipples after breastfeeding. Lanolin helps to maintain your skin's   normal moisture barrier. If you use pure lanolin, you do not need to wash it off before feeding your baby again. Pure lanolin is not toxic to your baby. You may also hand express a few drops of breast milk and gently massage that milk into your nipples and allow the milk to air dry. In the first few weeks after giving birth, some women experience extremely full breasts (engorgement). Engorgement can make your breasts feel heavy, warm, and tender to the touch.  Engorgement peaks within 3-5 days after you give birth. The following recommendations can help ease engorgement:  Completely empty your breasts while breastfeeding or pumping. You may want to start by applying warm, moist heat (in the shower or with warm water-soaked hand towels) just before feeding or pumping. This increases circulation and helps the milk flow. If your baby does not completely empty your breasts while breastfeeding, pump any extra milk after he or she is finished.  Wear a snug bra (nursing or regular) or tank top for 1-2 days to signal your body to slightly decrease milk production.  Apply ice packs to your breasts, unless this is too uncomfortable for you.  Make sure that your baby is latched on and positioned properly while breastfeeding. If engorgement persists after 48 hours of following these recommendations, contact your health care provider or a lactation consultant. OVERALL HEALTH CARE RECOMMENDATIONS WHILE BREASTFEEDING  Eat healthy foods. Alternate between meals and snacks, eating 3 of each per day. Because what you eat affects your breast milk, some of the foods may make your baby more irritable than usual. Avoid eating these foods if you are sure that they are negatively affecting your baby.  Drink milk, fruit juice, and water to satisfy your thirst (about 10 glasses a day).   Rest often, relax, and continue to take your prenatal vitamins to prevent fatigue, stress, and anemia.  Continue breast self-awareness checks.  Avoid chewing and smoking tobacco.  Avoid alcohol and drug use. Some medicines that may be harmful to your baby can pass through breast milk. It is important to ask your health care provider before taking any medicine, including all over-the-counter and prescription medicine as well as vitamin and herbal supplements. It is possible to become pregnant while breastfeeding. If birth control is desired, ask your health care provider about options that  will be safe for your baby. SEEK MEDICAL CARE IF:   You feel like you want to stop breastfeeding or have become frustrated with breastfeeding.  You have painful breasts or nipples.  Your nipples are cracked or bleeding.  Your breasts are red, tender, or warm.  You have a swollen area on either breast.  You have a fever or chills.  You have nausea or vomiting.  You have drainage other than breast milk from your nipples.  Your breasts do not become full before feedings by the fifth day after you give birth.  You feel sad and depressed.  Your baby is too sleepy to eat well.  Your baby is having trouble sleeping.   Your baby is wetting less than 3 diapers in a 24-hour period.  Your baby has less than 3 stools in a 24-hour period.  Your baby's skin or the white part of his or her eyes becomes yellow.   Your baby is not gaining weight by 5 days of age. SEEK IMMEDIATE MEDICAL CARE IF:   Your baby is overly tired (lethargic) and does not want to wake up and feed.  Your baby   develops an unexplained fever. Document Released: 10/05/2005 Document Revised: 10/10/2013 Document Reviewed: 03/29/2013 ExitCare Patient Information 2015 [employer]. This information is not intended to replace advice given to you by your health care provider. Make sure you discuss any questions you have with your health care provider.  

## 2015-06-11 ENCOUNTER — Encounter (HOSPITAL_COMMUNITY): Payer: Self-pay | Admitting: *Deleted

## 2015-06-11 ENCOUNTER — Telehealth (HOSPITAL_COMMUNITY): Payer: Self-pay | Admitting: *Deleted

## 2015-06-11 NOTE — Telephone Encounter (Signed)
Preadmission screen  

## 2015-06-11 NOTE — Progress Notes (Signed)
Subjective:  [name] is a 34 y.o. [id number] at [redacted]w[redacted]d being seen today for ongoing prenatal care.  Patient reports no complaints.  Contractions: Not present.  Vag. Bleeding: None. Movement: Present. Denies leaking of fluid.   The following portions of the patient's history were reviewed and updated as appropriate: allergies, current medications, past family history, past medical history, past social history, past surgical history and problem list.   Objective:   Filed Vitals:   06/10/15 1441  BP: 145/85  Pulse: 100  Weight: 334 lb (151.501 kg)    Fetal Status: Fetal Heart Rate (bpm): NST - R   Movement: Present     General:  Alert, oriented and cooperative. Patient is in no acute distress.  Skin: Skin is warm and dry. No rash noted.   Cardiovascular: Normal heart rate noted  Respiratory: Normal respiratory effort, no problems with respiration noted  Abdomen: Soft, gravid, appropriate for gestational age. Pain/Pressure: Present     Pelvic: Vag. Bleeding: None Vag D/C Character: Thin   Cervical exam deferred        Extremities: Normal range of motion.  Edema: Moderate pitting, indentation subsides rapidly  Mental Status: Normal mood and affect. Normal behavior. Normal judgment and thought content.   Urinalysis: Urine Protein: Negative Urine Glucose: Negative NST reviewed and reactive. CBG's are good in the am, but worse during the day. Assessment and Plan:  Pregnancy: [id number] at [redacted]w[redacted]d  1. Supervision of normal pregnancy, third trimester Continue routine prenatal care.   2. Gestational diabetes mellitus, antepartum For IOL at 39 wks  3. Group B Streptococcus carrier, +RV culture, currently pregnant Treat during labor  Term labor symptoms and general obstetric precautions including but not limited to vaginal bleeding, contractions, leaking of fluid and fetal movement were reviewed in detail with the patient. Please refer to After Visit Summary for other counseling  recommendations.  Return in 6 weeks (on 07/22/2015) for pp check.   Reva Bores, MD

## 2015-06-13 ENCOUNTER — Inpatient Hospital Stay (HOSPITAL_COMMUNITY)
Admission: AD | Admit: 2015-06-13 | Discharge: 2015-06-15 | DRG: 775 | Disposition: A | Discharge status: Home / Self Care | Payer: BLUE CROSS/BLUE SHIELD | Source: Ambulatory Visit | Attending: Family Medicine | Admitting: Family Medicine

## 2015-06-13 ENCOUNTER — Inpatient Hospital Stay (HOSPITAL_COMMUNITY): Payer: BLUE CROSS/BLUE SHIELD | Admitting: Anesthesiology

## 2015-06-13 ENCOUNTER — Encounter (HOSPITAL_COMMUNITY): Payer: Self-pay | Admitting: *Deleted

## 2015-06-13 DIAGNOSIS — O99824 Streptococcus B carrier state complicating childbirth: Secondary | ICD-10-CM | POA: Diagnosis present

## 2015-06-13 DIAGNOSIS — E559 Vitamin D deficiency, unspecified: Secondary | ICD-10-CM | POA: Diagnosis present

## 2015-06-13 DIAGNOSIS — O99214 Obesity complicating childbirth: Secondary | ICD-10-CM | POA: Diagnosis present

## 2015-06-13 DIAGNOSIS — R5382 Chronic fatigue, unspecified: Secondary | ICD-10-CM

## 2015-06-13 DIAGNOSIS — Z3493 Encounter for supervision of normal pregnancy, unspecified, third trimester: Secondary | ICD-10-CM

## 2015-06-13 DIAGNOSIS — O09522 Supervision of elderly multigravida, second trimester: Secondary | ICD-10-CM

## 2015-06-13 DIAGNOSIS — O3421 Maternal care for scar from previous cesarean delivery: Secondary | ICD-10-CM | POA: Diagnosis present

## 2015-06-13 DIAGNOSIS — Z87442 Personal history of urinary calculi: Secondary | ICD-10-CM | POA: Diagnosis not present

## 2015-06-13 DIAGNOSIS — O99820 Streptococcus B carrier state complicating pregnancy: Secondary | ICD-10-CM

## 2015-06-13 DIAGNOSIS — O34219 Maternal care for unspecified type scar from previous cesarean delivery: Secondary | ICD-10-CM

## 2015-06-13 DIAGNOSIS — O24429 Gestational diabetes mellitus in childbirth, unspecified control: Principal | ICD-10-CM | POA: Diagnosis present

## 2015-06-13 DIAGNOSIS — O24419 Gestational diabetes mellitus in pregnancy, unspecified control: Secondary | ICD-10-CM

## 2015-06-13 DIAGNOSIS — Z79899 Other long term (current) drug therapy: Secondary | ICD-10-CM

## 2015-06-13 DIAGNOSIS — IMO0002 Reserved for concepts with insufficient information to code with codable children: Secondary | ICD-10-CM

## 2015-06-13 DIAGNOSIS — Z6841 Body Mass Index (BMI) 40.0 and over, adult: Secondary | ICD-10-CM

## 2015-06-13 DIAGNOSIS — Z3A39 39 weeks gestation of pregnancy: Secondary | ICD-10-CM | POA: Diagnosis present

## 2015-06-13 DIAGNOSIS — O24420 Gestational diabetes mellitus in childbirth, diet controlled: Secondary | ICD-10-CM | POA: Diagnosis not present

## 2015-06-13 DIAGNOSIS — O169 Unspecified maternal hypertension, unspecified trimester: Secondary | ICD-10-CM | POA: Diagnosis present

## 2015-06-13 LAB — COMPREHENSIVE METABOLIC PANEL
ALT: 12 U/L — ABNORMAL LOW (ref 14–54)
AST: 18 U/L (ref 15–41)
Albumin: 2.6 g/dL — ABNORMAL LOW (ref 3.5–5.0)
Alkaline Phosphatase: 87 U/L (ref 38–126)
Anion gap: 8 (ref 5–15)
BUN: 9 mg/dL (ref 6–20)
CO2: 22 mmol/L (ref 22–32)
Calcium: 8.8 mg/dL — ABNORMAL LOW (ref 8.9–10.3)
Chloride: 106 mmol/L (ref 101–111)
Creatinine, Ser: 0.48 mg/dL (ref 0.44–1.00)
GFR calc Af Amer: >60 mL/min (ref >60)
GFR calc non Af Amer: >60 mL/min (ref >60)
Glucose, Bld: 119 mg/dL — ABNORMAL HIGH (ref 65–99)
Potassium: 3.8 mmol/L (ref 3.5–5.1)
Sodium: 136 mmol/L (ref 135–145)
Total Bilirubin: 0.5 mg/dL (ref 0.3–1.2)
Total Protein: 5.7 g/dL — ABNORMAL LOW (ref 6.5–8.1)

## 2015-06-13 LAB — PROTEIN / CREATININE RATIO, URINE
Creatinine, Urine: 48 mg/dL
Total Protein, Urine: <6 mg/dL

## 2015-06-13 LAB — CBC
HCT: 31.9 % — ABNORMAL LOW (ref 36.0–46.0)
HCT: 31.7 % — ABNORMAL LOW (ref 36.0–46.0)
Hemoglobin: 10.3 g/dL — ABNORMAL LOW (ref 12.0–15.0)
Hemoglobin: 10.2 g/dL — ABNORMAL LOW (ref 12.0–15.0)
MCH: 27.5 pg (ref 26.0–34.0)
MCH: 27.6 pg (ref 26.0–34.0)
MCHC: 32.3 g/dL (ref 30.0–36.0)
MCHC: 32.2 g/dL (ref 30.0–36.0)
MCV: 85.3 fL (ref 78.0–100.0)
MCV: 85.7 fL (ref 78.0–100.0)
Platelets: 181 10*3/uL (ref 150–400)
Platelets: 150 10*3/uL (ref 150–400)
RBC: 3.74 MIL/uL — ABNORMAL LOW (ref 3.87–5.11)
RBC: 3.7 MIL/uL — ABNORMAL LOW (ref 3.87–5.11)
RDW: 13.9 % (ref 11.5–15.5)
RDW: 13.9 % (ref 11.5–15.5)
WBC: 8.8 10*3/uL (ref 4.0–10.5)
WBC: 8.1 10*3/uL (ref 4.0–10.5)

## 2015-06-13 LAB — GLUCOSE, CAPILLARY
Glucose-Capillary: 110 mg/dL — ABNORMAL HIGH (ref 65–99)
Glucose-Capillary: 102 mg/dL — ABNORMAL HIGH (ref 65–99)

## 2015-06-13 LAB — TYPE AND SCREEN
ABO/RH(D): B POS
Antibody Screen: NEGATIVE

## 2015-06-13 MED ORDER — OXYTOCIN 40 UNITS IN LACTATED RINGERS INFUSION - SIMPLE MED
1.0000 m[IU]/min | INTRAVENOUS | Status: DC
  Administered 2015-06-13: 2 m[IU]/min via INTRAVENOUS

## 2015-06-13 MED ORDER — OXYCODONE-ACETAMINOPHEN 5-325 MG PO TABS
1.0000 | ORAL_TABLET | ORAL | Status: DC | PRN
  Administered 2015-06-14: 1 via ORAL
  Filled 2015-06-13: qty 1

## 2015-06-13 MED ORDER — OXYTOCIN 40 UNITS IN LACTATED RINGERS INFUSION - SIMPLE MED
62.5000 mL/h | INTRAVENOUS | Status: DC
  Administered 2015-06-14: 62.5 mL/h via INTRAVENOUS
  Filled 2015-06-13: qty 1000

## 2015-06-13 MED ORDER — ACETAMINOPHEN 325 MG PO TABS: 650.0000 mg | ORAL_TABLET | ORAL | Status: DC | PRN

## 2015-06-13 MED ORDER — LACTATED RINGERS IV SOLN
INTRAVENOUS | Status: DC
  Administered 2015-06-13: 125 mL via INTRAVENOUS
  Administered 2015-06-13 – 2015-06-14 (×2): via INTRAVENOUS

## 2015-06-13 MED ORDER — PHENYLEPHRINE 40 MCG/ML (10ML) SYRINGE FOR IV PUSH (FOR BLOOD PRESSURE SUPPORT)
80.0000 ug | PREFILLED_SYRINGE | INTRAVENOUS | Status: DC | PRN
  Filled 2015-06-13: qty 20

## 2015-06-13 MED ORDER — PENICILLIN G POTASSIUM 5000000 UNITS IJ SOLR
2.5000 10*6.[IU] | INTRAMUSCULAR | Status: DC
  Administered 2015-06-13 – 2015-06-14 (×3): 2.5 10*6.[IU] via INTRAVENOUS
  Filled 2015-06-13 (×6): qty 2.5

## 2015-06-13 MED ORDER — FENTANYL 2.5 MCG/ML BUPIVACAINE 1/10 % EPIDURAL INFUSION (WH - ANES): 14.0000 mL/h | INTRAMUSCULAR | Status: DC | PRN

## 2015-06-13 MED ORDER — OXYTOCIN BOLUS FROM INFUSION
500.0000 mL | INTRAVENOUS | Status: DC
  Administered 2015-06-14: 500 mL via INTRAVENOUS

## 2015-06-13 MED ORDER — OXYCODONE-ACETAMINOPHEN 5-325 MG PO TABS: 2.0000 | ORAL_TABLET | ORAL | Status: DC | PRN

## 2015-06-13 MED ORDER — DIPHENHYDRAMINE HCL 50 MG/ML IJ SOLN: 12.5000 mg | INTRAMUSCULAR | Status: DC | PRN

## 2015-06-13 MED ORDER — CITRIC ACID-SODIUM CITRATE 334-500 MG/5ML PO SOLN: 30.0000 mL | ORAL | Status: DC | PRN

## 2015-06-13 MED ORDER — FENTANYL 2.5 MCG/ML BUPIVACAINE 1/10 % EPIDURAL INFUSION (WH - ANES)
14.0000 mL/h | INTRAMUSCULAR | Status: DC | PRN
  Administered 2015-06-13: 16 mL/h via EPIDURAL
  Administered 2015-06-14: 14 mL/h via EPIDURAL
  Filled 2015-06-13 (×2): qty 125

## 2015-06-13 MED ORDER — ONDANSETRON HCL 4 MG/2ML IJ SOLN: 4.0000 mg | Freq: Four times a day (QID) | INTRAMUSCULAR | Status: DC | PRN

## 2015-06-13 MED ORDER — LIDOCAINE HCL (PF) 1 % IJ SOLN
30.0000 mL | INTRAMUSCULAR | Status: DC | PRN
  Filled 2015-06-13: qty 30

## 2015-06-13 MED ORDER — LIDOCAINE HCL (PF) 1 % IJ SOLN
INTRAMUSCULAR | Status: DC | PRN
  Administered 2015-06-13: 4 mL
  Administered 2015-06-13: 6 mL via EPIDURAL

## 2015-06-13 MED ORDER — PENICILLIN G POTASSIUM 5000000 UNITS IJ SOLR
5.0000 10*6.[IU] | Freq: Once | INTRAMUSCULAR | Status: AC
  Administered 2015-06-13: 5 10*6.[IU] via INTRAVENOUS
  Filled 2015-06-13: qty 5

## 2015-06-13 MED ORDER — EPHEDRINE 5 MG/ML INJ: 10.0000 mg | INTRAVENOUS | Status: DC | PRN

## 2015-06-13 MED ORDER — LACTATED RINGERS IV SOLN: 500.0000 mL | INTRAVENOUS | Status: DC | PRN

## 2015-06-13 MED ORDER — TERBUTALINE SULFATE 1 MG/ML IJ SOLN: 0.2500 mg | Freq: Once | INTRAMUSCULAR | Status: DC | PRN

## 2015-06-13 NOTE — Progress Notes (Signed)
   [name] is a 34 y.o. [id number] at [redacted]w[redacted]d  admitted for induction of labor due to Gestational diabetes.  Subjective: Not hurting with contractions  Objective: Filed Vitals:   06/13/15 1701 06/13/15 1804 06/13/15 1955 06/13/15 2206  BP: 146/79 128/52 135/88 116/40  Pulse: 91 98 104 86  Temp:  98.8 F (37.1 C) 98.8 F (37.1 C)   TempSrc:  Oral Oral   Resp: 18 20    Height:      Weight:          FHT:  FHR: 150 bpm, variability: moderate,  accelerations:  Present,  decelerations:  Absent UC:   IUPC placed. MVU's~ 100 SVE:   7/70/-3 Pitocin @ 14 mu/min  Labs: Lab Results  Component Value Date   WBC 8.1 06/13/2015   HGB 10.2* 06/13/2015   HCT 31.7* 06/13/2015   MCV 85.7 06/13/2015   PLT 150 06/13/2015   CBG's ~ 100  Assessment / Plan: Induction of labor due to gestational diabetes,  progressing well on pitocin Continue to increase pitocin until adequate labor Labor: slow, but inadequate Fetal Wellbeing:  Category I Pain Control:  Labor support without medications Anticipated MOD:  NSVD  CRESENZO-DISHMAN,Noboru Bidinger 06/13/2015, 10:37 PM

## 2015-06-13 NOTE — H&P (Signed)
LABOR ADMISSION HISTORY AND PHYSICAL  [name] is a 34 y.o. female [phone] with IUP at 22w0dby LMP presenting for IOL for gestational diabetes mellitus. She reports +FM, + contractions, No LOF, no VB, no blurry vision, headaches or peripheral edema, and RUQ pain.  She plans to breast feed. She is undecided regarding her method of contraception post-partum.  Dating: By LMP (09/13/14) --->  Estimated Date of Delivery: 06/20/15  Sono:    @[redacted]w[redacted]d , CWD, normal anatomy, transverse (head to maternal left) lie, 236 g, 35% EFW @ 362w0dCWD, normal anatomy, frank breech presentation, longitudinal lie, 2876 g, 69% EFW   Prenatal History/Complications:  Past Medical History: Past Medical History  Diagnosis Date  . Anxiety   . Pregnancy induced hypertension     labetolol  . Kidney stones 2013    13 passed  . Preterm labor     delivery  . Gestational diabetes 2008     glyburide this pregnancy    Past Surgical History: Past Surgical History  Procedure Laterality Date  . Cesarean section  2006  . Wisdom tooth extraction      x4    Obstetrical History: OB History    Gravida Para Term Preterm AB TAB SAB Ectopic Multiple Living   7 4 3 1 2  2   4       Social History: Social History   Social History  . Marital Status: Married    Spouse Name: N/A  . Number of Children: N/A  . Years of Education: N/A   Social History Main Topics  . Smoking status: Never Smoker   . Smokeless tobacco: Never Used  . Alcohol Use: No  . Drug Use: No  . Sexual Activity:    Partners: Male    Birth Control/ Protection: None   Other Topics Concern  . None   Social History Narrative    Family History: Family History  Problem Relation Age of Onset  . Schizophrenia Maternal Grandmother   . Atrial fibrillation Mother   . Depression Mother   . Anxiety disorder Mother   . Mental illness Father     Allergies: Allergies  Allergen Reactions  . Ivp Dye [Iodinated Diagnostic Agents]  Anaphylaxis  . Phenergan Fortis [Promethazine] Itching    Itching/pins and needles for hours.    Prescriptions prior to admission  Medication Sig Dispense Refill Last Dose  . ACCU-CHEK FASTCLIX LANCETS MISC 1 Units by Percutaneous route 4 (four) times daily. 100 each 12 Taking  . ACCU-CHEK FASTCLIX LANCETS MISC Check glucose twice daily. 60 each 6 Taking  . Blood Glucose Monitoring Suppl (ACCU-CHEK AVIVA PLUS) W/DEVICE KIT 1 kit by Does not apply route once. 1 kit 0 Taking  . Blood Glucose Monitoring Suppl (ACCU-CHEK NANO SMARTVIEW) W/DEVICE KIT 1 Device by Does not apply route as directed. Check fasting, and two hours after breakfast, lunch and dinner 1 kit 0 Taking  . cholecalciferol (VITAMIN D) 1000 UNITS tablet Take 1,000 Units by mouth 2 (two) times daily.   Taking  . fluconazole (DIFLUCAN) 150 MG tablet Take 1 tablet (150 mg total) by mouth once. Can take additional dose three days later if symptoms persist 1 tablet 3 Taking  . glucose blood (ACCU-CHEK AVIVA PLUS) test strip Use as instructed 100 each 12 Taking  . glucose blood (ACCU-CHEK SMARTVIEW) test strip Check blood sugars 4x/daily 100 each 12 Taking  . glyBURIDE (DIABETA) 2.5 MG tablet Take 1 tablet (2.5 mg total) by mouth at bedtime. (Patient taking  differently: Take 5 mg by mouth at bedtime. ) 30 tablet 3 Taking  . nystatin (MYCOSTATIN/NYSTOP) 100000 UNIT/GM POWD Apply to affected area 3 times a day 30 g 0 Taking  . nystatin cream (MYCOSTATIN) Apply 1 application topically 2 (two) times daily. 30 g 1 Taking  . Prenatal Multivit-Min-Fe-FA (PRENATAL VITAMINS PO) Take by mouth.   Taking     Review of Systems   All systems reviewed and negative except as stated in HPI  BP 124/77 mmHg  Pulse 80  Temp(Src) 98.1 F (36.7 C) (Oral)  Ht 6' 1"  (1.854 m)  Wt 330 lb (149.687 kg)  BMI 43.55 kg/m2  LMP 09/13/2014 General appearance: alert, cooperative and no distress Lungs: clear to auscultation bilaterally Heart: regular rate  and rhythm Abdomen: soft, non-tender; bowel sounds normal Pelvic: fingertip; Foley bulb placed Extremities: no sign of DVT, edema Presentation: [place] pending  Fetal monitoringBaseline: 145 bpm, Variability: Good {> 6 bpm) and Accelerations: Reactive Uterine activityFrequency: occasional and Duration: 70 seconds Dilation: 2 Effacement (%): 50 Station: -2 Exam by:: Dr Kennon Rounds   Prenatal labs: ABO, Rh: B/POS/-- (02/09 1454) Antibody: NEG (02/09 1454) Rubella:   RPR: NON REAC (02/09 1454)  HBsAg: NEGATIVE (02/09 1454)  HIV: NONREACTIVE (02/09 1454)  GBS: Positive (08/09 0000)  1 hr Glucola: not performed Genetic screening: no abnormalities Anatomy US: normal  Prenatal Transfer Tool  Maternal Diabetes: Yes:  Diabetes Type:  Insulin/Medication controlled Genetic Screening: Normal Maternal Ultrasounds/Referrals: Normal Fetal Ultrasounds or other Referrals:  None Maternal Substance Abuse:  No Significant Maternal Medications:  Meds include: Other: glyburide Significant Maternal Lab Results: None  No results found for this or any previous visit (from the past 24 hour(s)).  Patient Active Problem List   Diagnosis Date Noted  . Hypertension complicating pregnancy 56/38/9373  . Group B Streptococcus carrier, +RV culture, currently pregnant 05/30/2015  . Obesity in pregnancy, antepartum   . Gestational diabetes mellitus, antepartum 12/19/2014  . Vitamin D insufficiency 11/30/2014  . Supervision of normal pregnancy 11/27/2014  . Chronic fatigue 11/27/2014  . AMA (advanced maternal age) multigravida 35+ 11/27/2014  . Previous cesarean delivery affecting pregnancy, antepartum 11/27/2014  . Paroxysmal hypertension 04/28/2013  . Nephrolithiasis 11/29/2012  . Hydronephrosis, right 11/08/2012  . Anxiety   . Obesity, unspecified 12/23/2007  . GERD 12/23/2007  . GALLSTONES 12/23/2007  . Acquired cyst of kidney 12/23/2007    Assessment: [name] is a 34 y.o. [id number] at 70w0dhere  for IOL for gestational diabetes mellitus.   #Labor:Foley bulb, expectant management  #Pain: IV pain meds PRN, may request epidural  #FWB: Category I #ID:  GBS+ #MOF: Breast with bottle supplement  #MOC:undecided  AAdin Hector MD PGY-1 [facility]   Seen and examined by me also Agree with note Heart rate RRR Lungs clear Abdomen gravid, size = dates FHR reactive Plan as above, I will place Foley bulb MSeabron Spates CNM

## 2015-06-13 NOTE — Progress Notes (Signed)
Patient ID: [name], female   DOB: 05/15/1982, 34 y.o.   MRN: [id number] Bedside US done to confirm vertex Vertex presentation confirmed.  Aviva Signs, CNM

## 2015-06-13 NOTE — Anesthesia Procedure Notes (Signed)
Epidural Patient location during procedure: OB  Preanesthetic Checklist Completed: patient identified, site marked, surgical consent, pre-op evaluation, timeout performed, IV checked, risks and benefits discussed and monitors and equipment checked  Epidural Patient position: sitting Prep: site prepped and draped and DuraPrep Patient monitoring: continuous pulse ox and blood pressure Approach: midline Location: L3-L4 Injection technique: LOR air  Needle:  Needle type: Tuohy  Needle gauge: 17 G Needle length: 9 cm and 9 Needle insertion depth: 7 cm Catheter type: closed end flexible Catheter size: 19 Gauge Catheter at skin depth: 14 cm Test dose: negative  Assessment Events: blood not aspirated, injection not painful, no injection resistance, negative IV test and no paresthesia  Additional Notes Dosing of Epidural:  1st dose, through catheter .............................................  Xylocaine 40 mg  2nd dose, through catheter, after waiting 3 minutes.........Xylocaine 60 mg    As each dose occurred, patient was free of IV sx; and patient exhibited no evidence of SA injection.  Patient is more comfortable after epidural dosed. Please see RN's note for documentation of vital signs,and FHR which are stable.  Patient reminded not to try to ambulate with numb legs, and that an RN must be present when she attempts to get up.       

## 2015-06-13 NOTE — Progress Notes (Signed)
   [name] is a 34 y.o. [id number] at [redacted]w[redacted]d  admitted for induction of labor due to Gestational diabetes.  Subjective: Not hurting, requests AROM   Objective: Filed Vitals:   06/13/15 1426 06/13/15 1533 06/13/15 1701 06/13/15 1804  BP: 132/62 144/77 146/79 128/52  Pulse: 79 89 91 98  Temp:    98.8 F (37.1 C)  TempSrc:    Oral  Resp:  Height:      Weight:          FHT:  FHR: 140 bpm, variability: moderate,  accelerations:  Present,  decelerations:  Absent UC:   Irregular and mild SVE:   Dilation: 6 Effacement (%): 60 Station: -2 Exam by:: Cresenzo CNM  Pitocin @ 8 mu/min AROM with clear fluid  Labs: Lab Results  Component Value Date   WBC 8.1 06/13/2015   HGB 10.2* 06/13/2015   HCT 31.7* 06/13/2015   MCV 85.7 06/13/2015   PLT 150 06/13/2015    Assessment / Plan: Induction of labor due to gestational diabetes,  progressing well on pitocin  Labor: Progressing normally Fetal Wellbeing:  Category I Pain Control:  Labor support without medications Anticipated MOD:  NSVD  CRESENZO-DISHMAN,Mckinnon Glick 06/13/2015, 7:23 PM

## 2015-06-13 NOTE — Progress Notes (Signed)
[name] is a 34 y.o. [id number] at [redacted]w[redacted]d by LMP admitted for induction of labor due to Gestational diabetes and Hypertension.  Subjective:   Objective: BP 162/85 mmHg  Pulse 96  Temp(Src) 98.1 F (36.7 C) (Oral)  Ht  (1.854 m)  Wt 330 lb (149.687 kg)  BMI 43.55 kg/m2  LMP 09/13/2014      FHT:  FHR: 160 bpm, variability: moderate,  accelerations:  Present,  decelerations:  Absent UC:   none SVE:   Dilation: 2 Effacement (%): 50 Station: -2 Exam by:: Dr Shawnie Pons Foley placed and balloon inflated to 60 cc. Labs: Lab Results  Component Value Date   WBC 8.7 11/27/2014   HGB 13.3 11/27/2014   HCT 40.7 11/27/2014   MCV 87.3 11/27/2014   PLT 250 11/27/2014    Assessment / Plan: Induction of labor due to gestational diabetes and hypertension,  progressing well on pitocin  Labor: Foley ballon in place Preeclampsia:  check labs Fetal Wellbeing:  Category I Anticipated MOD:  VBAC  [name] 06/13/2015, 12:55 PM

## 2015-06-13 NOTE — Anesthesia Preprocedure Evaluation (Signed)
Anesthesia Evaluation  Patient identified by MRN, date of birth, ID band Patient awake    Reviewed: Allergy & Precautions, H&P , Patient's Chart, lab work & pertinent test results  Airway Mallampati: II  TM Distance: >3 FB Neck ROM: full    Dental  (+) Teeth Intact   Pulmonary  breath sounds clear to auscultation        Cardiovascular hypertension, Rhythm:regular Rate:Normal     Neuro/Psych    GI/Hepatic   Endo/Other  diabetes, GestationalMorbid obesity  Renal/GU      Musculoskeletal   Abdominal   Peds  Hematology   Anesthesia Other Findings       Reproductive/Obstetrics (+) Pregnancy                             Anesthesia Physical Anesthesia Plan  ASA: III  Anesthesia Plan: Epidural   Post-op Pain Management:    Induction:   Airway Management Planned:   Additional Equipment:   Intra-op Plan:   Post-operative Plan:   Informed Consent: I have reviewed the patients History and Physical, chart, labs and discussed the procedure including the risks, benefits and alternatives for the proposed anesthesia with the patient or authorized representative who has indicated his/her understanding and acceptance.   Dental Advisory Given  Plan Discussed with:   Anesthesia Plan Comments: (Labs checked- platelets confirmed with RN in room. Fetal heart tracing, per RN, reported to be stable enough for sitting procedure. Discussed epidural, and patient consents to the procedure:  included risk of possible headache,backache, failed block, allergic reaction, and nerve injury. This patient was asked if she had any questions or concerns before the procedure started.)        Anesthesia Quick Evaluation  

## 2015-06-13 NOTE — Plan of Care (Signed)
Problem: Phase I Progression Outcomes Goal: Assess per MD/Nurse,Routine-VS,FHR,UC,Head to Toe assess Outcome: Completed/Met Date Met:  06/13/15 Patient educated about s/s of preeclampsia.

## 2015-06-14 ENCOUNTER — Encounter (HOSPITAL_COMMUNITY): Payer: Self-pay

## 2015-06-14 DIAGNOSIS — Z3A39 39 weeks gestation of pregnancy: Secondary | ICD-10-CM

## 2015-06-14 DIAGNOSIS — O99824 Streptococcus B carrier state complicating childbirth: Secondary | ICD-10-CM

## 2015-06-14 DIAGNOSIS — O24420 Gestational diabetes mellitus in childbirth, diet controlled: Secondary | ICD-10-CM

## 2015-06-14 LAB — CBC
HCT: 29.2 % — ABNORMAL LOW (ref 36.0–46.0)
Hemoglobin: 9.4 g/dL — ABNORMAL LOW (ref 12.0–15.0)
MCH: 27.5 pg (ref 26.0–34.0)
MCHC: 32.2 g/dL (ref 30.0–36.0)
MCV: 85.4 fL (ref 78.0–100.0)
Platelets: 154 10*3/uL (ref 150–400)
RBC: 3.42 MIL/uL — ABNORMAL LOW (ref 3.87–5.11)
RDW: 13.9 % (ref 11.5–15.5)
WBC: 12.6 10*3/uL — ABNORMAL HIGH (ref 4.0–10.5)

## 2015-06-14 LAB — GLUCOSE, CAPILLARY: Glucose-Capillary: 64 mg/dL — ABNORMAL LOW (ref 65–99)

## 2015-06-14 LAB — HIV ANTIBODY (ROUTINE TESTING W REFLEX): HIV Screen 4th Generation wRfx: NONREACTIVE

## 2015-06-14 LAB — RPR: RPR Ser Ql: NONREACTIVE

## 2015-06-14 MED ORDER — WITCH HAZEL-GLYCERIN EX PADS: 1.0000 "application " | MEDICATED_PAD | CUTANEOUS | Status: DC | PRN

## 2015-06-14 MED ORDER — ONDANSETRON HCL 4 MG/2ML IJ SOLN: 4.0000 mg | INTRAMUSCULAR | Status: DC | PRN

## 2015-06-14 MED ORDER — ACETAMINOPHEN 325 MG PO TABS: 650.0000 mg | ORAL_TABLET | ORAL | Status: DC | PRN

## 2015-06-14 MED ORDER — BENZOCAINE-MENTHOL 20-0.5 % EX AERO
1.0000 "application " | INHALATION_SPRAY | CUTANEOUS | Status: DC | PRN
  Administered 2015-06-14: 1 via TOPICAL
  Filled 2015-06-14 (×2): qty 56

## 2015-06-14 MED ORDER — BISACODYL 10 MG RE SUPP
10.0000 mg | Freq: Every day | RECTAL | Status: DC | PRN
  Filled 2015-06-14: qty 1

## 2015-06-14 MED ORDER — FERROUS SULFATE 325 (65 FE) MG PO TABS
325.0000 mg | ORAL_TABLET | Freq: Two times a day (BID) | ORAL | Status: DC
  Administered 2015-06-14 – 2015-06-15 (×2): 325 mg via ORAL
  Filled 2015-06-14 (×3): qty 1

## 2015-06-14 MED ORDER — BUPIVACAINE HCL (PF) 0.25 % IJ SOLN
INTRAMUSCULAR | Status: DC | PRN
  Administered 2015-06-14 (×2): 5 mL

## 2015-06-14 MED ORDER — SIMETHICONE 80 MG PO CHEW: 80.0000 mg | CHEWABLE_TABLET | ORAL | Status: DC | PRN

## 2015-06-14 MED ORDER — OXYTOCIN 40 UNITS IN LACTATED RINGERS INFUSION - SIMPLE MED: 62.5000 mL/h | INTRAVENOUS | Status: DC | PRN

## 2015-06-14 MED ORDER — OXYCODONE-ACETAMINOPHEN 5-325 MG PO TABS
1.0000 | ORAL_TABLET | ORAL | Status: DC | PRN
  Administered 2015-06-14: 1 via ORAL
  Filled 2015-06-14: qty 1

## 2015-06-14 MED ORDER — METHYLERGONOVINE MALEATE 0.2 MG/ML IJ SOLN: 0.2000 mg | INTRAMUSCULAR | Status: DC | PRN

## 2015-06-14 MED ORDER — ZOLPIDEM TARTRATE 5 MG PO TABS: 5.0000 mg | ORAL_TABLET | Freq: Every evening | ORAL | Status: DC | PRN

## 2015-06-14 MED ORDER — METHYLERGONOVINE MALEATE 0.2 MG PO TABS: 0.2000 mg | ORAL_TABLET | ORAL | Status: DC | PRN

## 2015-06-14 MED ORDER — LANOLIN HYDROUS EX OINT: TOPICAL_OINTMENT | CUTANEOUS | Status: DC | PRN

## 2015-06-14 MED ORDER — PRENATAL MULTIVITAMIN CH
1.0000 | ORAL_TABLET | Freq: Every day | ORAL | Status: DC
  Administered 2015-06-14 – 2015-06-15 (×2): 1 via ORAL
  Filled 2015-06-14 (×2): qty 1

## 2015-06-14 MED ORDER — IBUPROFEN 600 MG PO TABS
600.0000 mg | ORAL_TABLET | Freq: Four times a day (QID) | ORAL | Status: DC
  Administered 2015-06-14 – 2015-06-15 (×6): 600 mg via ORAL
  Filled 2015-06-14 (×6): qty 1

## 2015-06-14 MED ORDER — DIPHENHYDRAMINE HCL 25 MG PO CAPS: 25.0000 mg | ORAL_CAPSULE | Freq: Four times a day (QID) | ORAL | Status: DC | PRN

## 2015-06-14 MED ORDER — OXYCODONE-ACETAMINOPHEN 5-325 MG PO TABS: 2.0000 | ORAL_TABLET | ORAL | Status: DC | PRN

## 2015-06-14 MED ORDER — SENNOSIDES-DOCUSATE SODIUM 8.6-50 MG PO TABS
2.0000 | ORAL_TABLET | ORAL | Status: DC
  Administered 2015-06-15: 2 via ORAL
  Filled 2015-06-14: qty 2

## 2015-06-14 MED ORDER — MEASLES, MUMPS & RUBELLA VAC ~~LOC~~ INJ
0.5000 mL | INJECTION | Freq: Once | SUBCUTANEOUS | Status: DC
  Filled 2015-06-14: qty 0.5

## 2015-06-14 MED ORDER — ONDANSETRON HCL 4 MG PO TABS: 4.0000 mg | ORAL_TABLET | ORAL | Status: DC | PRN

## 2015-06-14 MED ORDER — DIBUCAINE 1 % RE OINT
1.0000 "application " | TOPICAL_OINTMENT | RECTAL | Status: DC | PRN
  Filled 2015-06-14: qty 28

## 2015-06-14 MED ORDER — TETANUS-DIPHTH-ACELL PERTUSSIS 5-2.5-18.5 LF-MCG/0.5 IM SUSP: 0.5000 mL | Freq: Once | INTRAMUSCULAR | Status: DC

## 2015-06-14 MED ORDER — FLEET ENEMA 7-19 GM/118ML RE ENEM: 1.0000 | ENEMA | Freq: Every day | RECTAL | Status: DC | PRN

## 2015-06-14 NOTE — Progress Notes (Signed)
Pt expressed desire for early discharge due to children at home 4

## 2015-06-14 NOTE — Progress Notes (Signed)
   [name] is a 34 y.o. [id number] at [redacted]w[redacted]d  admitted for induction of labor due to Gestational diabetes.  Subjective: Was comfortable with epidural, now hurting again with contractions, no pressure Objective: Filed Vitals:   06/14/15 0021 06/14/15 0031 06/14/15 0101 06/14/15 0131  BP: 128/68 130/71 138/73 142/74  Pulse: 85 80 84 86  Temp:      TempSrc:      Resp:      Height:      Weight:      SpO2:          FHT:  FHR: 140 bpm, variability: moderate,  accelerations:  Abscent,  decelerations:  Absent UC:   MVU 140 SVE:   Dilation: 7 Effacement (%): 80 Station: -2, -1 Exam by:: Rodena Piety CNM Pitocin @ 22 mu/min  Labs: Lab Results  Component Value Date   WBC 8.1 06/13/2015   HGB 10.2* 06/13/2015   HCT 31.7* 06/13/2015   MCV 85.7 06/13/2015   PLT 150 06/13/2015    Assessment / Plan: Induction of labor due to gestational diabetes,  progressing well on pitocin Bolus epidural Labor: Progressing normally Fetal Wellbeing:  Category I Pain Control:  Epidural Anticipated MOD:  NSVD  CRESENZO-DISHMAN,Laurabeth Yip 06/14/2015, 2:13 AM

## 2015-06-14 NOTE — Lactation Note (Signed)
This note was copied from the chart of Jackie De Nurse. Lactation Consultation Note Initial visit at 13 hours of age.  Mom reports baby has had a few breast feedings and a few bottles of formula.  She has experience with breastfeeding 4 older children and gave formula with all of them.  Mom does report her milk comes in late and never enough.  Discussed supply and demand with mom in relation to exclusive breastfeeding and supplementing. Unsure if milk supply was low or not stimulated mom did take herbal supplements at various times.  Baby asleep in moms arms.  [facility] LC resources given and discussed.  Encouraged to feed with early cues on demand.  Early newborn behavior discussed.  Hand expression demonstrated with drop of colostrum visible.  MOm has bruising noted on left nipples and mom reports from her 1st feeding of 1 hour.  Discussed what active feeding looks like.  Mom already has comfort gels, but not in use.  Mom to call for assist as needed.     Patient Name: [name] [id number] Date: 06/14/2015 Reason for consult: Initial assessment   Maternal Data Has patient been taught Hand Expression?: Yes Does the patient have breastfeeding experience prior to this delivery?: Yes  Feeding Feeding Type: Bottle Fed - Formula Nipple Type: Slow - flow  LATCH Score/Interventions                      Lactation Tools Discussed/Used     Consult Status Consult Status: Follow-up Date: 06/15/15 Follow-up type: In-patient    Shoptaw, Arvella Merles 06/14/2015, 7:31 PM

## 2015-06-14 NOTE — Anesthesia Postprocedure Evaluation (Signed)
  Anesthesia Post-op Note  Patient: [name]  Procedure(s) Performed: * No procedures listed *  Patient Location: Mother/Baby  Anesthesia Type:Epidural  Level of Consciousness: awake and alert   Airway and Oxygen Therapy: Patient Spontanous Breathing  Post-op Pain: mild  Post-op Assessment: Post-op Vital signs reviewed, Patient's Cardiovascular Status Stable, Respiratory Function Stable, No signs of Nausea or vomiting, Pain level controlled, No headache, Spinal receding and Patient able to bend at knees              Post-op Vital Signs: Reviewed  Last Vitals:  Filed Vitals:   06/14/15 1033  BP: 155/69  Pulse: 98  Temp:   Resp: 20    Complications: No apparent anesthesia complications

## 2015-06-15 MED ORDER — IBUPROFEN 600 MG PO TABS: 600.0000 mg | ORAL_TABLET | Freq: Four times a day (QID) | ORAL | Status: DC | PRN

## 2015-06-15 NOTE — Discharge Summary (Signed)
Obstetric Discharge Summary Reason for Admission: induction of labor Prenatal Procedures: NST and ultrasound Intrapartum Procedures: spontaneous vaginal delivery and GBS prophylaxis Postpartum Procedures: none Complications-Operative and Postpartum: none  Delivery Note  After a 15 minute 2nd stage, At 5:06 AM a viable female was delivered via Vaginal, Spontaneous Delivery (Presentation: Right Occiput Anterior). APGAR: , 8/9; weight pending. After 3 m inutes, the cord was clampled and cut 40 units of pitocin diluted in 1000cc LR was infused rapidly IV. The placenta separated spontaneously and delivered via CCT and maternal pushing effort. It was inspected and appears to be intact with a 3 VC.   Anesthesia: Epidural  Episiotomy: None Lacerations: None Suture Repair:  Est. Blood Loss (mL): 200  Mom to postpartum. Baby to Couplet care / Skin to Skin.  Hospital Course:  Active Problems:  Hypertension complicating pregnancy   [name] is a 34 y.o. A1P3790 s/p SVD. Patient was admitted for IOL for gestational HTN. She has postpartum course that was uncomplicated including no problems with ambulating, PO intake, urination, pain, or bleeding. The pt feels ready to go home and will be discharged with outpatient follow-up.   Today: No acute events overnight. Pt denies problems with ambulating, voiding or po intake. She denies nausea or vomiting. Pain is well controlled. She has had flatus. She has not had bowel movement. Lochia Small. Plan for birth control is  undecided.. Method of Feeding: Breast.   Physical Exam:  General: alert, cooperative and no distress Lochia: appropriate Uterine Fundus: firm DVT Evaluation: no evidence of DVT on physical exam  H/H:  Labs (Brief)    Lab Results  Component Value Date/Time   HGB 9.4* 06/14/2015 08:30 AM   HCT 29.2* 06/14/2015 08:30 AM      Discharge Diagnoses: Term Pregnancy-delivered  Discharge  Information: Date: 06/15/2015 Activity: pelvic rest Diet: routine  Medications: Ibuprofen Breast feeding: Yes Condition: stable Discharge to: home       Discharge Instructions    Activity as tolerated  Complete by: As directed      Call MD for: difficulty breathing, headache or visual disturbances  Complete by: As directed      Call MD for: persistant dizziness or light-headedness  Complete by: As directed      Call MD for: persistant nausea and vomiting  Complete by: As directed      Call MD for: severe uncontrolled pain  Complete by: As directed      Call MD for: temperature >100.4  Complete by: As directed             Medication List    TAKE these medications       ACCU-CHEK FASTCLIX LANCETS Misc  1 Units by Percutaneous route 4 (four) times daily.     ACCU-CHEK FASTCLIX LANCETS Misc  Check glucose twice daily.     ACCU-CHEK NANO SMARTVIEW W/DEVICE Kit  1 Device by Does not apply route as directed. Check fasting, and two hours after breakfast, lunch and dinner     ACCU-CHEK AVIVA PLUS W/DEVICE Kit  1 kit by Does not apply route once.     cholecalciferol 1000 UNITS tablet  Commonly known as: VITAMIN D  Take 1,000 Units by mouth 2 (two) times daily.     glucose blood test strip  Commonly known as: ACCU-CHEK SMARTVIEW  Check blood sugars 4x/daily     glucose blood test strip  Commonly known as: ACCU-CHEK AVIVA PLUS  Use as instructed     ibuprofen 600  MG tablet  Commonly known as: ADVIL,MOTRIN  Take 1 tablet (600 mg total) by mouth every 6 (six) hours as needed.     PRENATAL VITAMINS PO  Take by mouth.       Follow-up Information    Follow up with [facility]. Schedule an appointment as soon as possible for a visit in 6 weeks.   Why: For hospital follow-up   Contact information:   [place]      Adin Hector, MD PGY-1 [facility]  06/15/2015,6:32 PM     OB fellow attestation I have seen and examined this patient and agree with above documentation in the resident's note.   [name] is a 34 y.o. H1T0569 s/p NSVD.   Pain is well controlled.  Plan for birth control is Unsure.  Method of Feeding: Breast  PE:  BP 117/82 mmHg  Pulse 83  Temp(Src) 98 F (36.7 C) (Oral)  Resp 18  Ht _0  (1.854 m)  Wt 330 lb (149.687 kg)  BMI 43.55 kg/m2  SpO2 99%  LMP 09/13/2014  Breastfeeding? Unknown Fundus firm  Recent Labs  06/13/15 1755 06/14/15 0830  HGB 10.2* 9.4*  HCT 31.7* 29.2*   Plan: discharge today - postpartum care discussed - f/u clinic in 6 weeks for postpartum visit  Caren Macadam, MD 3:44 AM

## 2015-06-15 NOTE — Discharge Instructions (Signed)

## 2015-06-15 NOTE — Progress Notes (Signed)
Post Partum Day 2  Subjective:  [name] is a 34 y.o. [id number] [redacted]w[redacted]d s/p SVD.  No acute events overnight.  Pt denies problems with ambulating, voiding or po intake.  She denies nausea or vomiting.  Pain is well controlled.  She has had flatus. She has not had bowel movement.  Lochia Small.  Plan for birth control is undecided..  Method of Feeding: Breast.  Objective: BP 117/82 mmHg  Pulse 83  Temp(Src) 98 F (36.7 C) (Oral)  Resp 18  Ht  (1.854 m)  Wt 330 lb (149.687 kg)  BMI 43.55 kg/m2  SpO2 99%  LMP 09/13/2014  Breastfeeding? Unknown  Physical Exam:  General: alert, cooperative and no distress Lochia:normal flow Chest: CTAB Heart: RRR no m/r/g Abdomen: +BS, soft, nontender, fundus firm at/below umbilicus Uterine Fundus: firm DVT Evaluation: No evidence of DVT seen on physical exam. Extremities: trace edema   Recent Labs  06/13/15 1755 06/14/15 0830  HGB 10.2* 9.4*  HCT 31.7* 29.2*    Assessment/Plan:  ASSESSMENT: [name] is a 34 y.o. A5W0981 [redacted]w[redacted]d ppd #2 s/p NSVD doing well.   Plan for discharge tomorrow   LOS: 2 days   Tarri Abernethy 06/15/2015, 2:43 PM   OB fellow attestation:  I have seen and examined this patient; I agree with above documentation in the resident's note. Patient decided to go home. Please see discharge summary  Federico Flake, MD 12:10 AM

## 2015-06-15 NOTE — Progress Notes (Signed)
Patient complains of dizziness with ambulation, patient had clots with just going to the bathroom to void. Voided last 4 hrs ago. Clots noted on pad in the trash can.

## 2015-06-15 NOTE — Discharge Summary (Deleted)
Obstetric Discharge Summary Reason for Admission: induction of labor Prenatal Procedures: NST and ultrasound Intrapartum Procedures: spontaneous vaginal delivery and GBS prophylaxis Postpartum Procedures: none Complications-Operative and Postpartum: none  Delivery Note  After a 15 minute 2nd stage, At 5:06 AM a viable female was delivered via Vaginal, Spontaneous Delivery (Presentation: Right Occiput Anterior). APGAR: , 8/9; weight pending. After 3 m inutes, the cord was clampled and cut 40 units of pitocin diluted in 1000cc LR was infused rapidly IV. The placenta separated spontaneously and delivered via CCT and maternal pushing effort. It was inspected and appears to be intact with a 3 VC.   Anesthesia: Epidural  Episiotomy: None Lacerations: None Suture Repair:  Est. Blood Loss (mL): 200  Mom to postpartum. Baby to Couplet care / Skin to Skin.  Hospital Course:  Active Problems:   Hypertension complicating pregnancy   [name] is a 34 y.o. [id number] s/p SVD.  Patient was admitted for IOL for gestational HTN.  She has postpartum course that was uncomplicated including no problems with ambulating, PO intake, urination, pain, or bleeding. The pt feels ready to go home and  will be discharged with outpatient follow-up.   Today: No acute events overnight.  Pt denies problems with ambulating, voiding or po intake.  She denies nausea or vomiting.  Pain is well controlled.  She has had flatus. She has not had bowel movement.  Lochia Small.  Plan for birth control is  undecided..  Method of Feeding: Breast.   Physical Exam:  General: alert, cooperative and no distress Lochia: appropriate Uterine Fundus: firm DVT Evaluation: no evidence of DVT on physical exam  H/H: Lab Results  Component Value Date/Time   HGB 9.4* 06/14/2015 08:30 AM   HCT 29.2* 06/14/2015 08:30 AM    Discharge Diagnoses: Term Pregnancy-delivered  Discharge Information: Date: 06/15/2015 Activity:  pelvic rest Diet: routine  Medications: Ibuprofen Breast feeding:  Yes Condition: stable Discharge to: home       Discharge Instructions    Activity as tolerated    Complete by:  As directed      Call MD for:  difficulty breathing, headache or visual disturbances    Complete by:  As directed      Call MD for:  persistant dizziness or light-headedness    Complete by:  As directed      Call MD for:  persistant nausea and vomiting    Complete by:  As directed      Call MD for:  severe uncontrolled pain    Complete by:  As directed      Call MD for:  temperature >100.4    Complete by:  As directed             Medication List    TAKE these medications        ACCU-CHEK FASTCLIX LANCETS Misc  1 Units by Percutaneous route 4 (four) times daily.     ACCU-CHEK FASTCLIX LANCETS Misc  Check glucose twice daily.     ACCU-CHEK NANO SMARTVIEW W/DEVICE Kit  1 Device by Does not apply route as directed. Check fasting, and two hours after breakfast, lunch and dinner     ACCU-CHEK AVIVA PLUS W/DEVICE Kit  1 kit by Does not apply route once.     cholecalciferol 1000 UNITS tablet  Commonly known as:  VITAMIN D  Take 1,000 Units by mouth 2 (two) times daily.     glucose blood test strip  Commonly known as:  ACCU-CHEK SMARTVIEW  Check blood sugars 4x/daily     glucose blood test strip  Commonly known as:  ACCU-CHEK AVIVA PLUS  Use as instructed     ibuprofen 600 MG tablet  Commonly known as:  ADVIL,MOTRIN  Take 1 tablet (600 mg total) by mouth every 6 (six) hours as needed.     PRENATAL VITAMINS PO  Take by mouth.       Follow-up Information    Follow up with [facility]. Schedule an appointment as soon as possible for a visit in 6 weeks.   Why:  For hospital follow-up   Contact information:   [address] [place] [phone]      Adin Hector, MD PGY-1 [facility]  06/15/2015,2:36 PM

## 2015-09-06 ENCOUNTER — Ambulatory Visit: Payer: Self-pay | Admitting: Family Medicine

## 2015-09-06 DIAGNOSIS — N920 Excessive and frequent menstruation with regular cycle: Secondary | ICD-10-CM | POA: Insufficient documentation

## 2015-09-06 DIAGNOSIS — J309 Allergic rhinitis, unspecified: Secondary | ICD-10-CM | POA: Insufficient documentation

## 2015-09-06 DIAGNOSIS — G4700 Insomnia, unspecified: Secondary | ICD-10-CM

## 2015-09-06 DIAGNOSIS — F410 Panic disorder [episodic paroxysmal anxiety] without agoraphobia: Secondary | ICD-10-CM | POA: Insufficient documentation

## 2015-09-06 DIAGNOSIS — N200 Calculus of kidney: Secondary | ICD-10-CM | POA: Insufficient documentation

## 2015-09-06 HISTORY — DX: Allergic rhinitis, unspecified: J30.9

## 2015-09-06 HISTORY — DX: Insomnia, unspecified: G47.00

## 2015-09-06 HISTORY — DX: Excessive and frequent menstruation with regular cycle: N92.0

## 2016-03-17 ENCOUNTER — Telehealth: Payer: Self-pay

## 2016-03-17 NOTE — Telephone Encounter (Signed)
Talked to mom .  Scheduled him an appt for today.

## 2016-03-17 NOTE — Telephone Encounter (Signed)
That's fine

## 2016-03-17 NOTE — Telephone Encounter (Signed)
Patient wanted to know if you will start seeing her 36 yr old son? His insurance is [employer]. She reports that he no longer has a pediatrician. She also reports that he has had a ear ache X 2 days and wanted to know if he could be seen today? Please advise. Contact number is [phone]. Thanks!

## 2016-04-16 ENCOUNTER — Ambulatory Visit (INDEPENDENT_AMBULATORY_CARE_PROVIDER_SITE_OTHER): Payer: Medicaid Other | Admitting: Obstetrics and Gynecology

## 2016-04-16 ENCOUNTER — Encounter: Payer: Self-pay | Admitting: Obstetrics and Gynecology

## 2016-04-16 ENCOUNTER — Other Ambulatory Visit: Payer: Self-pay | Admitting: Obstetrics and Gynecology

## 2016-04-16 ENCOUNTER — Other Ambulatory Visit (HOSPITAL_COMMUNITY)
Admission: RE | Admit: 2016-04-16 | Discharge: 2016-04-16 | Disposition: A | Discharge status: Home / Self Care | Payer: Medicaid Other | Source: Ambulatory Visit | Attending: Obstetrics and Gynecology | Admitting: Obstetrics and Gynecology

## 2016-04-16 VITALS — BP 161/98 | HR 74 | Wt 309.0 lb

## 2016-04-16 DIAGNOSIS — O34219 Maternal care for unspecified type scar from previous cesarean delivery: Secondary | ICD-10-CM | POA: Diagnosis not present

## 2016-04-16 DIAGNOSIS — O0991 Supervision of high risk pregnancy, unspecified, first trimester: Secondary | ICD-10-CM | POA: Insufficient documentation

## 2016-04-16 DIAGNOSIS — Z36 Encounter for antenatal screening of mother: Secondary | ICD-10-CM

## 2016-04-16 DIAGNOSIS — O99211 Obesity complicating pregnancy, first trimester: Secondary | ICD-10-CM | POA: Diagnosis not present

## 2016-04-16 DIAGNOSIS — Z8632 Personal history of gestational diabetes: Secondary | ICD-10-CM

## 2016-04-16 DIAGNOSIS — Z01411 Encounter for gynecological examination (general) (routine) with abnormal findings: Secondary | ICD-10-CM | POA: Insufficient documentation

## 2016-04-16 DIAGNOSIS — E669 Obesity, unspecified: Secondary | ICD-10-CM

## 2016-04-16 DIAGNOSIS — O09899 Supervision of other high risk pregnancies, unspecified trimester: Secondary | ICD-10-CM | POA: Insufficient documentation

## 2016-04-16 DIAGNOSIS — Z113 Encounter for screening for infections with a predominantly sexual mode of transmission: Secondary | ICD-10-CM | POA: Diagnosis not present

## 2016-04-16 DIAGNOSIS — Z124 Encounter for screening for malignant neoplasm of cervix: Secondary | ICD-10-CM | POA: Diagnosis not present

## 2016-04-16 DIAGNOSIS — O09299 Supervision of pregnancy with other poor reproductive or obstetric history, unspecified trimester: Secondary | ICD-10-CM | POA: Insufficient documentation

## 2016-04-16 DIAGNOSIS — Z1151 Encounter for screening for human papillomavirus (HPV): Secondary | ICD-10-CM | POA: Insufficient documentation

## 2016-04-16 DIAGNOSIS — O09521 Supervision of elderly multigravida, first trimester: Secondary | ICD-10-CM

## 2016-04-16 DIAGNOSIS — O09891 Supervision of other high risk pregnancies, first trimester: Secondary | ICD-10-CM

## 2016-04-16 DIAGNOSIS — O09291 Supervision of pregnancy with other poor reproductive or obstetric history, first trimester: Secondary | ICD-10-CM

## 2016-04-16 HISTORY — DX: Personal history of gestational diabetes: Z86.32

## 2016-04-16 NOTE — Progress Notes (Signed)
Last pap smear in 2013 C/O possible yeast infection

## 2016-04-16 NOTE — Progress Notes (Signed)
Bedside US shows single IUP with FHR confirmed. Unable to measure CRL or FHR due to US machine not working properly.

## 2016-04-16 NOTE — Progress Notes (Signed)
Pt called office after leaving from office visit, declined scheduling an appt for a 1 hr GTT.  Informed pt of recommendations, proceeded to decline testing.

## 2016-04-16 NOTE — Addendum Note (Signed)
Addended by: Arne ClevelandHUTCHINSON, MANDY J on: 04/16/2016 11:54 AM   Modules accepted: Orders

## 2016-04-16 NOTE — Addendum Note (Signed)
Addended by: Arne ClevelandHUTCHINSON, Blessen Kimbrough J on: 04/16/2016 11:25 AM   Modules accepted: Orders

## 2016-04-16 NOTE — Progress Notes (Signed)
Subjective:    [name] L Brown is a [name] 2361w3d being seen today for her first obstetrical visit.  Her obstetrical history is significant for advanced maternal age, obesity, pre-eclampsia and gestational diabetes, previous cesarean section followed by 4 VBAC. Patient does intend to breast feed. Pregnancy history fully reviewed.  Patient reports no complaints.  Filed Vitals:   04/16/16 1040  BP: 161/98  Pulse: 74  Weight: 309 lb (140.161 kg)    HISTORY: OB History  Gravida Para Term Preterm AB SAB TAB Ectopic Multiple Living  8 5 4 1 2 2    0 5    # Outcome Date GA Lbr Len/2nd Weight Sex Delivery Anes PTL Lv  8 Current           7 Term 06/14/15 7162w1d 09:12 / 00:34 8 lb 2.7 oz (3.705 kg) F VBAC EPI  Y  6 SAB 2015          5 Term 06/05/13 878w1d 03:16 / 00:21 7 lb 8 oz (3.402 kg) F VBAC EPI  Y  4 Term 2010 4536w0d   F VBAC   Y     Comments: GHTN  3 SAB 2010          2 Term 2008 5036w0d   M VBAC EPI  Y     Comments: GHTN  1 Preterm 2006 6726w0d   M CS-LTranv Spinal  Y     Comments: pre eclampsia     Past Medical History  Diagnosis Date  . Anxiety   . Pregnancy induced hypertension     labetolol  . Kidney stones 2013    13 passed  . Preterm labor     delivery  . Gestational diabetes 2008     glyburide this pregnancy  . History of chicken pox    Past Surgical History  Procedure Laterality Date  . Cesarean section  2006  . Wisdom tooth extraction      x4   Family History  Problem Relation Age of Onset  . Schizophrenia Maternal Grandmother   . Atrial fibrillation Mother   . Depression Mother   . Anxiety disorder Mother   . Mental illness Father   . Heart attack Maternal Grandfather   . Atrial fibrillation Paternal Grandfather   . Diabetes Paternal Grandfather   . Heart attack Paternal Grandfather   . Allergies Sister      Exam    Uterus:     Pelvic Exam:    Perineum: No Hemorrhoids, Normal Perineum   Vulva: normal   Vagina:  normal mucosa, normal  discharge   pH:    Cervix: closed and long   Adnexa: normal adnexa and no mass, fullness, tenderness   Bony Pelvis: gynecoid  System: Breast:  normal appearance, no masses or tenderness   Skin: normal coloration and turgor, no rashes    Neurologic: oriented, no focal deficits   Extremities: normal strength, tone, and muscle mass   HEENT extra ocular movement intact   Mouth/Teeth mucous membranes moist, pharynx normal without lesions and dental hygiene good   Neck supple and no masses   Cardiovascular: regular rate and rhythm   Respiratory:  chest clear, no wheezing, crepitations, rhonchi, normal symmetric air entry   Abdomen: soft, non-tender; bowel sounds normal; no masses,  no organomegaly   Urinary:       Assessment:    Pregnancy: [name] Patient Active Problem List   Diagnosis Date Noted  . Supervision of other high risk pregnancy, antepartum 04/16/2016  .  History of gestational diabetes in prior pregnancy, currently pregnant 04/16/2016  . Episodic paroxysmal anxiety disorder 09/06/2015  . Excess, menstruation 09/06/2015  . Kidney stone 09/06/2015  . Insomnia 09/06/2015  . Allergic rhinitis 09/06/2015  . Hypertension complicating pregnancy 06/13/2015  . Group B Streptococcus carrier, +RV culture, currently pregnant 05/30/2015  . Obesity in pregnancy, antepartum   . Vitamin D insufficiency 11/30/2014  . Chronic fatigue 11/27/2014  . AMA (advanced maternal age) multigravida 35+ 11/27/2014  . Previous cesarean delivery affecting pregnancy, antepartum 11/27/2014  . Paroxysmal hypertension 04/28/2013  . Nephrolithiasis 11/29/2012  . Hydronephrosis, right 11/08/2012  . Anxiety   . Obesity, unspecified 12/23/2007  . GERD 12/23/2007  . GALLSTONES 12/23/2007  . Acquired cyst of kidney 12/23/2007        Plan:     Initial labs drawn. Prenatal vitamins. Problem list reviewed and updated. Genetic Screening discussed First Screen: ordered.  Ultrasound discussed; fetal  survey: requested. Patient with elevated BP today. States it was normal at her MD visit last month. Will closely monitor. Informed patient that we may need to start medications if remains elevated  Follow up in 4 weeks. 50% of 30 min visit spent on counseling and coordination of care.     Eragon Hammond 04/16/2016

## 2016-04-16 NOTE — Patient Instructions (Signed)
 First Trimester of Pregnancy The first trimester of pregnancy is from week 1 until the end of week 12 (months 1 through 3). A week after a sperm fertilizes an egg, the egg will implant on the wall of the uterus. This embryo will begin to develop into a baby. Genes from you and your partner are forming the baby. The female genes determine whether the baby is a boy or a girl. At 6-8 weeks, the eyes and face are formed, and the heartbeat can be seen on ultrasound. At the end of 12 weeks, all the baby's organs are formed.  Now that you are pregnant, you will want to do everything you can to have a healthy baby. Two of the most important things are to get good prenatal care and to follow your health care provider's instructions. Prenatal care is all the medical care you receive before the baby's birth. This care will help prevent, find, and treat any problems during the pregnancy and childbirth. BODY CHANGES Your body goes through many changes during pregnancy. The changes vary from woman to woman.   You may gain or lose a couple of pounds at first.  You may feel sick to your stomach (nauseous) and throw up (vomit). If the vomiting is uncontrollable, call your health care provider.  You may tire easily.  You may develop headaches that can be relieved by medicines approved by your health care provider.  You may urinate more often. Painful urination may mean you have a bladder infection.  You may develop heartburn as a result of your pregnancy.  You may develop constipation because certain hormones are causing the muscles that push waste through your intestines to slow down.  You may develop hemorrhoids or swollen, bulging veins (varicose veins).  Your breasts may begin to grow larger and become tender. Your nipples may stick out more, and the tissue that surrounds them (areola) may become darker.  Your gums may bleed and may be sensitive to brushing and flossing.  Dark spots or blotches  (chloasma, mask of pregnancy) may develop on your face. This will likely fade after the baby is born.  Your menstrual periods will stop.  You may have a loss of appetite.  You may develop cravings for certain kinds of food.  You may have changes in your emotions from day to day, such as being excited to be pregnant or being concerned that something may go wrong with the pregnancy and baby.  You may have more vivid and strange dreams.  You may have changes in your hair. These can include thickening of your hair, rapid growth, and changes in texture. Some women also have hair loss during or after pregnancy, or hair that feels dry or thin. Your hair will most likely return to normal after your baby is born. WHAT TO EXPECT AT YOUR PRENATAL VISITS During a routine prenatal visit:  You will be weighed to make sure you and the baby are growing normally.  Your blood pressure will be taken.  Your abdomen will be measured to track your baby's growth.  The fetal heartbeat will be listened to starting around week 10 or 12 of your pregnancy.  Test results from any previous visits will be discussed. Your health care provider may ask you:  How you are feeling.  If you are feeling the baby move.  If you have had any abnormal symptoms, such as leaking fluid, bleeding, severe headaches, or abdominal cramping.  If you are using any tobacco   products, including cigarettes, chewing tobacco, and electronic cigarettes.  If you have any questions. Other tests that may be performed during your first trimester include:  Blood tests to find your blood type and to check for the presence of any previous infections. They will also be used to check for low iron levels (anemia) and Rh antibodies. Later in the pregnancy, blood tests for diabetes will be done along with other tests if problems develop.  Urine tests to check for infections, diabetes, or protein in the urine.  An ultrasound to confirm the  proper growth and development of the baby.  An amniocentesis to check for possible genetic problems.  Fetal screens for spina bifida and Down syndrome.  You may need other tests to make sure you and the baby are doing well.  HIV (human immunodeficiency virus) testing. Routine prenatal testing includes screening for HIV, unless you choose not to have this test. HOME CARE INSTRUCTIONS  Medicines  Follow your health care provider's instructions regarding medicine use. Specific medicines may be either safe or unsafe to take during pregnancy.  Take your prenatal vitamins as directed.  If you develop constipation, try taking a stool softener if your health care provider approves. Diet  Eat regular, well-balanced meals. Choose a variety of foods, such as meat or vegetable-based protein, fish, milk and low-fat dairy products, vegetables, fruits, and whole grain breads and cereals. Your health care provider will help you determine the amount of weight gain that is right for you.  Avoid raw meat and uncooked cheese. These carry germs that can cause birth defects in the baby.  Eating four or five small meals rather than three large meals a day may help relieve nausea and vomiting. If you start to feel nauseous, eating a few soda crackers can be helpful. Drinking liquids between meals instead of during meals also seems to help nausea and vomiting.  If you develop constipation, eat more high-fiber foods, such as fresh vegetables or fruit and whole grains. Drink enough fluids to keep your urine clear or pale yellow. Activity and Exercise  Exercise only as directed by your health care provider. Exercising will help you:  Control your weight.  Stay in shape.  Be prepared for labor and delivery.  Experiencing pain or cramping in the lower abdomen or low back is a good sign that you should stop exercising. Check with your health care provider before continuing normal exercises.  Try to avoid  standing for long periods of time. Move your legs often if you must stand in one place for a long time.  Avoid heavy lifting.  Wear low-heeled shoes, and practice good posture.  You may continue to have sex unless your health care provider directs you otherwise. Relief of Pain or Discomfort  Wear a good support bra for breast tenderness.   Take warm sitz baths to soothe any pain or discomfort caused by hemorrhoids. Use hemorrhoid cream if your health care provider approves.   Rest with your legs elevated if you have leg cramps or low back pain.  If you develop varicose veins in your legs, wear support hose. Elevate your feet for 15 minutes, 3-4 times a day. Limit salt in your diet. Prenatal Care  Schedule your prenatal visits by the twelfth week of pregnancy. They are usually scheduled monthly at first, then more often in the last 2 months before delivery.  Write down your questions. Take them to your prenatal visits.  Keep all your prenatal visits as directed by   your health care provider. Safety  Wear your seat belt at all times when driving.  Make a list of emergency phone numbers, including numbers for family, friends, the hospital, and police and fire departments. General Tips  Ask your health care provider for a referral to a local prenatal education class. Begin classes no later than at the beginning of month 6 of your pregnancy.  Ask for help if you have counseling or nutritional needs during pregnancy. Your health care provider can offer advice or refer you to specialists for help with various needs.  Do not use hot tubs, steam rooms, or saunas.  Do not douche or use tampons or scented sanitary pads.  Do not cross your legs for long periods of time.  Avoid cat litter boxes and soil used by cats. These carry germs that can cause birth defects in the baby and possibly loss of the fetus by miscarriage or stillbirth.  Avoid all smoking, herbs, alcohol, and medicines  not prescribed by your health care provider. Chemicals in these affect the formation and growth of the baby.  Do not use any tobacco products, including cigarettes, chewing tobacco, and electronic cigarettes. If you need help quitting, ask your health care provider. You may receive counseling support and other resources to help you quit.  Schedule a dentist appointment. At home, brush your teeth with a soft toothbrush and be gentle when you floss. SEEK MEDICAL CARE IF:   You have dizziness.  You have mild pelvic cramps, pelvic pressure, or nagging pain in the abdominal area.  You have persistent nausea, vomiting, or diarrhea.  You have a bad smelling vaginal discharge.  You have pain with urination.  You notice increased swelling in your face, hands, legs, or ankles. SEEK IMMEDIATE MEDICAL CARE IF:   You have a fever.  You are leaking fluid from your vagina.  You have spotting or bleeding from your vagina.  You have severe abdominal cramping or pain.  You have rapid weight gain or loss.  You vomit blood or material that looks like coffee grounds.  You are exposed to German measles and have never had them.  You are exposed to fifth disease or chickenpox.  You develop a severe headache.  You have shortness of breath.  You have any kind of trauma, such as from a fall or a car accident.   This information is not intended to replace advice given to you by your health care provider. Make sure you discuss any questions you have with your health care provider.   Document Released: 09/29/2001 Document Revised: 10/26/2014 Document Reviewed: 08/15/2013 Elsevier Interactive Patient Education 2016 [employer]  Contraception Choices Contraception (birth control) is the use of any methods or devices to prevent pregnancy. Below are some methods to help avoid pregnancy. HORMONAL METHODS   Contraceptive implant. This is a thin, plastic tube containing progesterone hormone. It does  not contain estrogen hormone. Your health care provider inserts the tube in the inner part of the upper arm. The tube can remain in place for up to 3 years. After 3 years, the implant must be removed. The implant prevents the ovaries from releasing an egg (ovulation), thickens the cervical mucus to prevent sperm from entering the uterus, and thins the lining of the inside of the uterus.  Progesterone-only injections. These injections are given every 3 months by your health care provider to prevent pregnancy. This synthetic progesterone hormone stops the ovaries from releasing eggs. It also thickens cervical mucus and changes   the uterine lining. This makes it harder for sperm to survive in the uterus.  Birth control pills. These pills contain estrogen and progesterone hormone. They work by preventing the ovaries from releasing eggs (ovulation). They also cause the cervical mucus to thicken, preventing the sperm from entering the uterus. Birth control pills are prescribed by a health care provider.Birth control pills can also be used to treat heavy periods.  Minipill. This type of birth control pill contains only the progesterone hormone. They are taken every day of each month and must be prescribed by your health care provider.  Birth control patch. The patch contains hormones similar to those in birth control pills. It must be changed once a week and is prescribed by a health care provider.  Vaginal ring. The ring contains hormones similar to those in birth control pills. It is left in the vagina for 3 weeks, removed for 1 week, and then a new one is put back in place. The patient must be comfortable inserting and removing the ring from the vagina.A health care provider's prescription is necessary.  Emergency contraception. Emergency contraceptives prevent pregnancy after unprotected sexual intercourse. This pill can be taken right after sex or up to 5 days after unprotected sex. It is most effective  the sooner you take the pills after having sexual intercourse. Most emergency contraceptive pills are available without a prescription. Check with your pharmacist. Do not use emergency contraception as your only form of birth control. BARRIER METHODS   Female condom. This is a thin sheath (latex or rubber) that is worn over the penis during sexual intercourse. It can be used with spermicide to increase effectiveness.  Female condom. This is a soft, loose-fitting sheath that is put into the vagina before sexual intercourse.  Diaphragm. This is a soft, latex, dome-shaped barrier that must be fitted by a health care provider. It is inserted into the vagina, along with a spermicidal jelly. It is inserted before intercourse. The diaphragm should be left in the vagina for 6 to 8 hours after intercourse.  Cervical cap. This is a round, soft, latex or plastic cup that fits over the cervix and must be fitted by a health care provider. The cap can be left in place for up to 48 hours after intercourse.  Sponge. This is a soft, circular piece of polyurethane foam. The sponge has spermicide in it. It is inserted into the vagina after wetting it and before sexual intercourse.  Spermicides. These are chemicals that kill or block sperm from entering the cervix and uterus. They come in the form of creams, jellies, suppositories, foam, or tablets. They do not require a prescription. They are inserted into the vagina with an applicator before having sexual intercourse. The process must be repeated every time you have sexual intercourse. INTRAUTERINE CONTRACEPTION  Intrauterine device (IUD). This is a T-shaped device that is put in a woman's uterus during a menstrual period to prevent pregnancy. There are 2 types:  Copper IUD. This type of IUD is wrapped in copper wire and is placed inside the uterus. Copper makes the uterus and fallopian tubes produce a fluid that kills sperm. It can stay in place for 10  years.  Hormone IUD. This type of IUD contains the hormone progestin (synthetic progesterone). The hormone thickens the cervical mucus and prevents sperm from entering the uterus, and it also thins the uterine lining to prevent implantation of a fertilized egg. The hormone can weaken or kill the sperm that get into   the uterus. It can stay in place for 3-5 years, depending on which type of IUD is used. PERMANENT METHODS OF CONTRACEPTION  Female tubal ligation. This is when the woman's fallopian tubes are surgically sealed, tied, or blocked to prevent the egg from traveling to the uterus.  Hysteroscopic sterilization. This involves placing a small coil or insert into each fallopian tube. Your doctor uses a technique called hysteroscopy to do the procedure. The device causes scar tissue to form. This results in permanent blockage of the fallopian tubes, so the sperm cannot fertilize the egg. It takes about 3 months after the procedure for the tubes to become blocked. You must use another form of birth control for these 3 months.  Female sterilization. This is when the female has the tubes that carry sperm tied off (vasectomy).This blocks sperm from entering the vagina during sexual intercourse. After the procedure, the man can still ejaculate fluid (semen). NATURAL PLANNING METHODS  Natural family planning. This is not having sexual intercourse or using a barrier method (condom, diaphragm, cervical cap) on days the woman could become pregnant.  Calendar method. This is keeping track of the length of each menstrual cycle and identifying when you are fertile.  Ovulation method. This is avoiding sexual intercourse during ovulation.  Symptothermal method. This is avoiding sexual intercourse during ovulation, using a thermometer and ovulation symptoms.  Post-ovulation method. This is timing sexual intercourse after you have ovulated. Regardless of which type or method of contraception you choose, it is  important that you use condoms to protect against the transmission of sexually transmitted infections (STIs). Talk with your health care provider about which form of contraception is most appropriate for you.   This information is not intended to replace advice given to you by your health care provider. Make sure you discuss any questions you have with your health care provider.   Document Released: 10/05/2005 Document Revised: 10/10/2013 Document Reviewed: 03/30/2013 Elsevier Interactive Patient Education 2016 [employer] Inc[employer]   Breastfeeding Deciding to breastfeed is one of the [place] choices you can make for you and your baby. A change in hormones during pregnancy causes your breast tissue to grow and increases the number and size of your milk ducts. These hormones also allow proteins, sugars, and fats from your blood supply to make breast milk in your milk-producing glands. Hormones prevent breast milk from being released before your baby is born as well as prompt milk flow after birth. Once breastfeeding has begun, thoughts of your baby, as well as his or her sucking or crying, can stimulate the release of milk from your milk-producing glands.  BENEFITS OF BREASTFEEDING For Your Baby  Your first milk (colostrum) helps your baby's digestive system function better.  There are antibodies in your milk that help your baby fight off infections.  Your baby has a lower incidence of asthma, allergies, and sudden infant death syndrome.  The nutrients in breast milk are better for your baby than infant formulas and are designed uniquely for your baby's needs.  Breast milk improves your baby's brain development.  Your baby is less likely to develop other conditions, such as childhood obesity, asthma, or type 2 diabetes mellitus. For You  Breastfeeding helps to create a very special bond between you and your baby.  Breastfeeding is convenient. Breast milk is always available at the correct  temperature and costs nothing.  Breastfeeding helps to burn calories and helps you lose the weight gained during pregnancy.  Breastfeeding makes your uterus contract   to its prepregnancy size faster and slows bleeding (lochia) after you give birth.   Breastfeeding helps to lower your risk of developing type 2 diabetes mellitus, osteoporosis, and breast or ovarian cancer later in life. SIGNS THAT YOUR BABY IS HUNGRY Early Signs of Hunger  Increased alertness or activity.  Stretching.  Movement of the head from side to side.  Movement of the head and opening of the mouth when the corner of the mouth or cheek is stroked (rooting).  Increased sucking sounds, smacking lips, cooing, sighing, or squeaking.  Hand-to-mouth movements.  Increased sucking of fingers or hands. Late Signs of Hunger  Fussing.  Intermittent crying. Extreme Signs of Hunger Signs of extreme hunger will require calming and consoling before your baby will be able to breastfeed successfully. Do not wait for the following signs of extreme hunger to occur before you initiate breastfeeding:  Restlessness.  A loud, strong cry.  Screaming. BREASTFEEDING BASICS Breastfeeding Initiation  Find a comfortable place to sit or lie down, with your neck and back well supported.  Place a pillow or rolled up blanket under your baby to bring him or her to the level of your breast (if you are seated). Nursing pillows are specially designed to help support your arms and your baby while you breastfeed.  Make sure that your baby's abdomen is facing your abdomen.  Gently massage your breast. With your fingertips, massage from your chest wall toward your nipple in a circular motion. This encourages milk flow. You may need to continue this action during the feeding if your milk flows slowly.  Support your breast with 4 fingers underneath and your thumb above your nipple. Make sure your fingers are well away from your nipple and  your baby's mouth.  Stroke your baby's lips gently with your finger or nipple.  When your baby's mouth is open wide enough, quickly bring your baby to your breast, placing your entire nipple and as much of the colored area around your nipple (areola) as possible into your baby's mouth.  More areola should be visible above your baby's upper lip than below the lower lip.  Your baby's tongue should be between his or her lower gum and your breast.  Ensure that your baby's mouth is correctly positioned around your nipple (latched). Your baby's lips should create a seal on your breast and be turned out (everted).  It is common for your baby to suck about 2-3 minutes in order to start the flow of breast milk. Latching Teaching your baby how to latch on to your breast properly is very important. An improper latch can cause nipple pain and decreased milk supply for you and poor weight gain in your baby. Also, if your baby is not latched onto your nipple properly, he or she may swallow some air during feeding. This can make your baby fussy. Burping your baby when you switch breasts during the feeding can help to get rid of the air. However, teaching your baby to latch on properly is still the [place] way to prevent fussiness from swallowing air while breastfeeding. Signs that your baby has successfully latched on to your nipple:  Silent tugging or silent sucking, without causing you pain.  Swallowing heard between every 3-4 sucks.  Muscle movement above and in front of his or her ears while sucking. Signs that your baby has not successfully latched on to nipple:  Sucking sounds or smacking sounds from your baby while breastfeeding.  Nipple pain. If you think your   baby has not latched on correctly, slip your finger into the corner of your baby's mouth to break the suction and place it between your baby's gums. Attempt breastfeeding initiation again. Signs of Successful Breastfeeding Signs from your  baby:  A gradual decrease in the number of sucks or complete cessation of sucking.  Falling asleep.  Relaxation of his or her body.  Retention of a small amount of milk in his or her mouth.  Letting go of your breast by himself or herself. Signs from you:  Breasts that have increased in firmness, weight, and size 1-3 hours after feeding.  Breasts that are softer immediately after breastfeeding.  Increased milk volume, as well as a change in milk consistency and color by the fifth day of breastfeeding.  Nipples that are not sore, cracked, or bleeding. Signs That Your Baby is Getting Enough Milk  Wetting at least 3 diapers in a 24-hour period. The urine should be clear and pale yellow by age 5 days.  At least 3 stools in a 24-hour period by age 5 days. The stool should be soft and yellow.  At least 3 stools in a 24-hour period by age 7 days. The stool should be seedy and yellow.  No loss of weight greater than 10% of birth weight during the first 3 days of age.  Average weight gain of 4-7 ounces (113-198 g) per week after age 4 days.  Consistent daily weight gain by age 5 days, without weight loss after the age of 2 weeks. After a feeding, your baby may spit up a small amount. This is common. BREASTFEEDING FREQUENCY AND DURATION Frequent feeding will help you make more milk and can prevent sore nipples and breast engorgement. Breastfeed when you feel the need to reduce the fullness of your breasts or when your baby shows signs of hunger. This is called "breastfeeding on demand." Avoid introducing a pacifier to your baby while you are working to establish breastfeeding (the first 4-6 weeks after your baby is born). After this time you may choose to use a pacifier. Research has shown that pacifier use during the first year of a baby's life decreases the risk of sudden infant death syndrome (SIDS). Allow your baby to feed on each breast as long as he or she wants. Breastfeed until your  baby is finished feeding. When your baby unlatches or falls asleep while feeding from the first breast, offer the second breast. Because newborns are often sleepy in the first few weeks of life, you may need to awaken your baby to get him or her to feed. Breastfeeding times will vary from baby to baby. However, the following rules can serve as a guide to help you ensure that your baby is properly fed:  Newborns (babies 4 weeks of age or younger) may breastfeed every 1-3 hours.  Newborns should not go longer than 3 hours during the day or 5 hours during the night without breastfeeding.  You should breastfeed your baby a minimum of 8 times in a 24-hour period until you begin to introduce solid foods to your baby at around 6 months of age. BREAST MILK PUMPING Pumping and storing breast milk allows you to ensure that your baby is exclusively fed your breast milk, even at times when you are unable to breastfeed. This is especially important if you are going back to work while you are still breastfeeding or when you are not able to be present during feedings. Your lactation consultant can give you   guidelines on how long it is safe to store breast milk. A breast pump is a machine that allows you to pump milk from your breast into a sterile bottle. The pumped breast milk can then be stored in a refrigerator or freezer. Some breast pumps are operated by hand, while others use electricity. Ask your lactation consultant which type will work [place] for you. Breast pumps can be purchased, but some hospitals and breastfeeding support groups lease breast pumps on a monthly basis. A lactation consultant can teach you how to hand express breast milk, if you prefer not to use a pump. CARING FOR YOUR BREASTS WHILE YOU BREASTFEED Nipples can become dry, cracked, and sore while breastfeeding. The following recommendations can help keep your breasts moisturized and healthy:  Avoid using soap on your nipples.  Wear a  supportive bra. Although not required, special nursing bras and tank tops are designed to allow access to your breasts for breastfeeding without taking off your entire bra or top. Avoid wearing underwire-style bras or extremely tight bras.  Air dry your nipples for 3-4minutes after each feeding.  Use only cotton bra pads to absorb leaked breast milk. Leaking of breast milk between feedings is normal.  Use lanolin on your nipples after breastfeeding. Lanolin helps to maintain your skin's normal moisture barrier. If you use pure lanolin, you do not need to wash it off before feeding your baby again. Pure lanolin is not toxic to your baby. You may also hand express a few drops of breast milk and gently massage that milk into your nipples and allow the milk to air dry. In the first few weeks after giving birth, some women experience extremely full breasts (engorgement). Engorgement can make your breasts feel heavy, warm, and tender to the touch. Engorgement peaks within 3-5 days after you give birth. The following recommendations can help ease engorgement:  Completely empty your breasts while breastfeeding or pumping. You may want to start by applying warm, moist heat (in the shower or with warm water-soaked hand towels) just before feeding or pumping. This increases circulation and helps the milk flow. If your baby does not completely empty your breasts while breastfeeding, pump any extra milk after he or she is finished.  Wear a snug bra (nursing or regular) or tank top for 1-2 days to signal your body to slightly decrease milk production.  Apply ice packs to your breasts, unless this is too uncomfortable for you.  Make sure that your baby is latched on and positioned properly while breastfeeding. If engorgement persists after 48 hours of following these recommendations, contact your health care provider or a lactation consultant. OVERALL HEALTH CARE RECOMMENDATIONS WHILE BREASTFEEDING  Eat healthy  foods. Alternate between meals and snacks, eating 3 of each per day. Because what you eat affects your breast milk, some of the foods may make your baby more irritable than usual. Avoid eating these foods if you are sure that they are negatively affecting your baby.  Drink milk, fruit juice, and water to satisfy your thirst (about 10 glasses a day).  Rest often, relax, and continue to take your prenatal vitamins to prevent fatigue, stress, and anemia.  Continue breast self-awareness checks.  Avoid chewing and smoking tobacco. Chemicals from cigarettes that pass into breast milk and exposure to secondhand smoke may harm your baby.  Avoid alcohol and drug use, including marijuana. Some medicines that may be harmful to your baby can pass through breast milk. It is important to ask your health   care provider before taking any medicine, including all over-the-counter and prescription medicine as well as vitamin and herbal supplements. It is possible to become pregnant while breastfeeding. If birth control is desired, ask your health care provider about options that will be safe for your baby. SEEK MEDICAL CARE IF:  You feel like you want to stop breastfeeding or have become frustrated with breastfeeding.  You have painful breasts or nipples.  Your nipples are cracked or bleeding.  Your breasts are red, tender, or warm.  You have a swollen area on either breast.  You have a fever or chills.  You have nausea or vomiting.  You have drainage other than breast milk from your nipples.  Your breasts do not become full before feedings by the fifth day after you give birth.  You feel sad and depressed.  Your baby is too sleepy to eat well.  Your baby is having trouble sleeping.   Your baby is wetting less than 3 diapers in a 24-hour period.  Your baby has less than 3 stools in a 24-hour period.  Your baby's skin or the white part of his or her eyes becomes yellow.   Your baby is not  gaining weight by 5 days of age. SEEK IMMEDIATE MEDICAL CARE IF:  Your baby is overly tired (lethargic) and does not want to wake up and feed.  Your baby develops an unexplained fever.   This information is not intended to replace advice given to you by your health care provider. Make sure you discuss any questions you have with your health care provider.   Document Released: 10/05/2005 Document Revised: 06/26/2015 Document Reviewed: 03/29/2013 Elsevier Interactive Patient Education 2016 Else[employer] Inc[employer]  Vaginal Birth After Cesarean Delivery Vaginal birth after cesarean delivery (VBAC) is giving birth vaginally after previously delivering a baby by a cesarean. In the past, if a woman had a cesarean delivery, all births afterward would be done by cesarean delivery. This is no longer true. It can be safe for the mother to try a vaginal delivery after having a cesarean delivery.  It is important to discuss VBAC with your health care provider early in the pregnancy so you can understand the risks, benefits, and options. It will give you time to decide what is B[place] in your particular case. The final decision about whether to have a VBAC or repeat cesarean delivery should be between you and your health care provider. Any changes in your health or your baby's health during your pregnancy may make it necessary to change your initial decision about VBAC.  WOMEN WHO PLAN TO HAVE A VBAC SHOULD CHECK WITH THEIR HEALTH CARE PROVIDER TO BE SURE THAT:  The previous cesarean delivery was done with a low transverse uterine cut (incision) (not a vertical classical incision).   The birth canal is big enough for the baby.   There were no other operations on the uterus.   An electronic fetal monitor (EFM) will be on at all times during labor.   An operating room will be available and ready in case an emergency cesarean delivery is needed.   A health care provider and surgical nursing staff will be  available at all times during labor to be ready to do an emergency delivery cesarean if necessary.   An anesthesiologist will be present in case an emergency cesarean delivery is needed.   The nursery is prepared and has adequate personnel and necessary equipment available to care for the baby in case of an   emergency cesarean delivery. BENEFITS OF VBAC  Shorter stay in the hospital.   Avoidance of risks associated with cesarean delivery, such as:  Surgical complications, such as opening of the incision or hernia in the incision.  Injury to other organs.  Fever. This can occur if an infection develops after surgery. It can also occur as a reaction to the medicine given to make you numb during the surgery.  Less blood loss and need for blood transfusions.  Lower risk of blood clots and infection.  Shorter recovery.   Decreased risk for having to remove the uterus (hysterectomy).   Decreased risk for the placenta to completely or partially cover the opening of the uterus (placenta previa) with a future pregnancy.   Decrease risk in future labor and delivery. RISKS OF A VBAC  Tearing (rupture) of the uterus. This is occurs in less than 1% of VBACs. The risk of this happening is higher if:  Steps are taken to begin the labor process (induce labor) or stimulate or strengthen contractions (augment labor).   Medicine is used to soften (ripen) the cervix.  Having to remove the uterus (hysterectomy) if it ruptures. VBAC SHOULD NOT BE DONE IF:  The previous cesarean delivery was done with a vertical (classical) or T-shaped incision or you do not know what kind of incision was made.   You had a ruptured uterus.   You have had certain types of surgery on your uterus, such as removal of uterine fibroids. Ask your health care provider about other types of surgeries that prevent you from having a VBAC.  You have certain medical or childbirth (obstetrical) problems.   There  are problems with the baby.   You have had two previous cesarean deliveries and no vaginal deliveries. OTHER FACTS TO KNOW ABOUT VBAC:  It is safe to have an epidural anesthetic with VBAC.   It is safe to turn the baby from a breech position (attempt an external cephalic version).   It is safe to try a VBAC with twins.   VBAC may not be successful if your baby weights 8.8 lb (4 kg) or more. However, weight predictions are not always accurate and should not be used alone to decide if VBAC is right for you.  There is an increased failure rate if the time between the cesarean delivery and VBAC is less than 19 months.   Your health care provider may advise against a VBAC if you have preeclampsia (high blood pressure, protein in the urine, and swelling of face and extremities).   VBAC is often successful if you previously gave birth vaginally.   VBAC is often successful when the labor starts spontaneously before the due date.   Delivering a baby through a VBAC is similar to having a normal spontaneous vaginal delivery.   This information is not intended to replace advice given to you by your health care provider. Make sure you discuss any questions you have with your health care provider.   Document Released: 03/28/2007 Document Revised: 10/26/2014 Document Reviewed: 05/04/2013 Elsevier Interactive Patient Education 2016 Elsevier Inc.  

## 2016-04-17 ENCOUNTER — Telehealth: Payer: Self-pay | Admitting: *Deleted

## 2016-04-17 LAB — PRENATAL PROFILE (SOLSTAS)
Antibody Screen: NEGATIVE
Basophils Absolute: 0 cells/uL (ref 0–200)
Basophils Relative: 0 %
Eosinophils Absolute: 64 cells/uL (ref 15–500)
Eosinophils Relative: 1 %
HCT: 40.5 % (ref 35.0–45.0)
HIV 1&2 Ab, 4th Generation: NONREACTIVE
Hemoglobin: 13.1 g/dL (ref 11.7–15.5)
Hepatitis B Surface Ag: NEGATIVE
Lymphocytes Relative: 25 %
Lymphs Abs: 1600 cells/uL (ref 850–3900)
MCH: 27.9 pg (ref 27.0–33.0)
MCHC: 32.3 g/dL (ref 32.0–36.0)
MCV: 86.4 fL (ref 80.0–100.0)
MPV: 10.6 fL (ref 7.5–12.5)
Monocytes Absolute: 320 cells/uL (ref 200–950)
Monocytes Relative: 5 %
Neutro Abs: 4416 cells/uL (ref 1500–7800)
Neutrophils Relative %: 69 %
Platelets: 251 10*3/uL (ref 140–400)
RBC: 4.69 MIL/uL (ref 3.80–5.10)
RDW: 14.7 % (ref 11.0–15.0)
Rh Type: POSITIVE
Rubella: 1.26 Index — ABNORMAL HIGH (ref <0.90)
WBC: 6.4 10*3/uL (ref 3.8–10.8)

## 2016-04-17 LAB — WET PREP, GENITAL
Trich, Wet Prep: NONE SEEN
Yeast Wet Prep HPF POC: NONE SEEN

## 2016-04-17 MED ORDER — METRONIDAZOLE 500 MG PO TABS: 500.0000 mg | ORAL_TABLET | Freq: Two times a day (BID) | ORAL | Status: DC

## 2016-04-17 NOTE — Addendum Note (Signed)
Addended by: Catalina AntiguaONSTANT, Malcom Selmer on: 04/17/2016 08:15 AM   Modules accepted: Orders

## 2016-04-17 NOTE — Telephone Encounter (Signed)
-----   Message from Catalina AntiguaPeggy Constant, MD sent at 04/17/2016  8:15 AM EDT ----- Please inform patient of BV. Flagyl has been e-prescribed  AnimatorThanks  Peggy

## 2016-04-17 NOTE — Telephone Encounter (Signed)
Tried calling patient to advise - VM full unable to leave message

## 2016-04-20 ENCOUNTER — Encounter: Payer: Self-pay | Admitting: *Deleted

## 2016-04-20 LAB — CYTOLOGY - PAP

## 2016-04-23 LAB — PAIN MGMT, PROFILE 6 CONF W/O MM, U: Please note:: 0

## 2016-04-27 ENCOUNTER — Telehealth: Payer: Self-pay | Admitting: *Deleted

## 2016-04-27 NOTE — Telephone Encounter (Signed)
-----   Message from Peggy Constant, MD sent at 04/23/2016  8:57 PM EDT ----- Please inform patient of abnormal pap smear and need for colposcopy  Thanks  Peggy 

## 2016-04-27 NOTE — Telephone Encounter (Signed)
Called pt to inform her of results, no answer, unable to leave VM due to mailbox being full.

## 2016-05-06 ENCOUNTER — Telehealth: Payer: Self-pay | Admitting: *Deleted

## 2016-05-06 NOTE — Telephone Encounter (Signed)
Called pt, no answer, unable to leave message due to mailbox being full.  Have attempted to contact pt multiple times with no success.  Will inform her of results at next visit.

## 2016-05-06 NOTE — Telephone Encounter (Signed)
-----   Message from Catalina AntiguaPeggy Constant, MD sent at 04/23/2016  8:57 PM EDT ----- Please inform patient of abnormal pap smear and need for colposcopy  Thanks  [facility]

## 2016-05-12 ENCOUNTER — Ambulatory Visit (INDEPENDENT_AMBULATORY_CARE_PROVIDER_SITE_OTHER): Payer: Medicaid Other | Admitting: Obstetrics and Gynecology

## 2016-05-12 VITALS — BP 157/98 | HR 90 | Wt 306.0 lb

## 2016-05-12 DIAGNOSIS — E669 Obesity, unspecified: Secondary | ICD-10-CM

## 2016-05-12 DIAGNOSIS — O99212 Obesity complicating pregnancy, second trimester: Secondary | ICD-10-CM | POA: Diagnosis not present

## 2016-05-12 DIAGNOSIS — O09892 Supervision of other high risk pregnancies, second trimester: Secondary | ICD-10-CM | POA: Diagnosis not present

## 2016-05-12 DIAGNOSIS — O34219 Maternal care for unspecified type scar from previous cesarean delivery: Secondary | ICD-10-CM

## 2016-05-12 DIAGNOSIS — O09292 Supervision of pregnancy with other poor reproductive or obstetric history, second trimester: Secondary | ICD-10-CM

## 2016-05-12 DIAGNOSIS — O162 Unspecified maternal hypertension, second trimester: Secondary | ICD-10-CM

## 2016-05-12 DIAGNOSIS — Z8632 Personal history of gestational diabetes: Secondary | ICD-10-CM

## 2016-05-12 MED ORDER — ASPIRIN EC 81 MG PO TBEC: 81.0000 mg | DELAYED_RELEASE_TABLET | Freq: Every day | ORAL | 6 refills | Status: DC

## 2016-05-12 MED ORDER — LABETALOL HCL 200 MG PO TABS
200.0000 mg | ORAL_TABLET | Freq: Two times a day (BID) | ORAL | 3 refills | Status: DC
Start: 2016-05-12 — End: 2017-04-13

## 2016-05-12 NOTE — Progress Notes (Signed)
Subjective:  [name] is a 35 y.o. [id number] at [redacted]w[redacted]d being seen today for ongoing prenatal care.  She is currently monitored for the following issues for this high-risk pregnancy and has Obesity, unspecified; GERD; GALLSTONES; Acquired cyst of kidney; Anxiety; Hydronephrosis, right; Nephrolithiasis; Paroxysmal hypertension; Chronic fatigue; AMA (advanced maternal age) multigravida 35+; Previous cesarean delivery affecting pregnancy, antepartum; Vitamin D insufficiency; Obesity in pregnancy, antepartum; Hypertension complicating pregnancy; Episodic paroxysmal anxiety disorder; Excess, menstruation; Kidney stone; Insomnia; Allergic rhinitis; Supervision of other high risk pregnancy, antepartum; and History of gestational diabetes in prior pregnancy, currently pregnant on her problem list.  Patient reports no complaints.  Contractions: Not present. Vag. Bleeding: None.  Movement: Absent. Denies leaking of fluid.   The following portions of the patient's history were reviewed and updated as appropriate: allergies, current medications, past family history, past medical history, past social history, past surgical history and problem list. Problem list updated.  Objective:   Vitals:   05/12/16 1010  BP: (!) 157/98  Pulse: 90  Weight: (!) 306 lb (138.8 kg)    Fetal Status:     Movement: Absent     General:  Alert, oriented and cooperative. Patient is in no acute distress.  Skin: Skin is warm and dry. No rash noted.   Cardiovascular: Normal heart rate noted  Respiratory: Normal respiratory effort, no problems with respiration noted  Abdomen: Soft, gravid, appropriate for gestational age. Pain/Pressure: Present     Pelvic:  Cervical exam deferred        Extremities: Normal range of motion.  Edema: None  Mental Status: Normal mood and affect. Normal behavior. Normal judgment and thought content.   Urinalysis: Urine Protein: Trace Urine Glucose: Negative  Assessment and Plan:  Pregnancy:  [id number] at [redacted]w[redacted]d  1. Supervision of other high risk pregnancy, antepartum, second trimester Patient is doing well Informed of abnormal pap smear and need for colpo at her next visit - labetalol (NORMODYNE) 200 MG tablet; Take 1 tablet (200 mg total) by mouth 2 (two) times daily.  Dispense: 60 tablet; Refill: 3 - aspirin EC 81 MG tablet; Take 1 tablet (81 mg total) by mouth daily.  Dispense: 30 tablet; Refill: 6 - Culture, OB Urine - Pain Mgmt, Profile 6 Conf w/o mM, U  2. Previous cesarean delivery affecting pregnancy, antepartum Desires VBAC  3. History of gestational diabetes in prior pregnancy, currently pregnant, second trimester Declined early 1 hour CBG  4. Obesity in pregnancy, antepartum, second trimester   5. Hypertension complicating pregnancy, second trimester Patient with elevated BP today again. She reports BP at home 140's/80's Recommended starting labetalol 200 mg BID Patient is not thrilled about the idea but agrees to comply - Protein / Creatinine Ratio, Urine  General obstetric precautions including but not limited to vaginal bleeding, contractions, leaking of fluid and fetal movement were reviewed in detail with the patient. Please refer to After Visit Summary for other counseling recommendations.  Return in 4 weeks (on 06/09/2016) for colpo and ROB.   Catalina Antigua, MD

## 2016-05-13 LAB — PROTEIN / CREATININE RATIO, URINE
Creatinine, Urine: 221 mg/dL (ref 20–320)
Protein Creatinine Ratio: 86 mg/g creat (ref 21–161)
Total Protein, Urine: 19 mg/dL (ref 5–24)

## 2016-05-15 LAB — PAIN MGMT, PROFILE 6 CONF W/O MM, U
6 Acetylmorphine: NEGATIVE ng/mL (ref <10)
Alcohol Metabolites: NEGATIVE ng/mL (ref <500)
Amphetamines: NEGATIVE ng/mL (ref <500)
Barbiturates: NEGATIVE ng/mL (ref <300)
Benzodiazepines: NEGATIVE ng/mL (ref <100)
Cocaine Metabolite: NEGATIVE ng/mL (ref <150)
Creatinine: 189.6 mg/dL (ref >=20.0)
Marijuana Metabolite: NEGATIVE ng/mL (ref <20)
Methadone Metabolite: NEGATIVE ng/mL (ref <100)
Opiates: NEGATIVE ng/mL (ref <100)
Oxidant: NEGATIVE ug/mL (ref <200)
Oxycodone: NEGATIVE ng/mL (ref <100)
Phencyclidine: NEGATIVE ng/mL (ref <25)
Please note:: 0
pH: 6.74 (ref 4.5–9.0)

## 2016-05-15 LAB — CULTURE, OB URINE: Colony Count: >=100000

## 2016-05-18 ENCOUNTER — Telehealth: Payer: Self-pay | Admitting: *Deleted

## 2016-05-18 ENCOUNTER — Other Ambulatory Visit: Payer: Self-pay | Admitting: Obstetrics and Gynecology

## 2016-05-18 MED ORDER — CEPHALEXIN 500 MG PO CAPS: 500.0000 mg | ORAL_CAPSULE | Freq: Four times a day (QID) | ORAL | 0 refills | Status: DC

## 2016-05-18 NOTE — Telephone Encounter (Signed)
-----   Message from Catalina Antigua, MD sent at 05/18/2016  7:52 AM EDT ----- Please inform patient of UTI. Rx e-prescribed  Thanks  [employer]

## 2016-05-18 NOTE — Telephone Encounter (Signed)
Called pt, no answer, unable to leave message due to mailbox being full.

## 2016-05-19 ENCOUNTER — Telehealth: Payer: Self-pay | Admitting: *Deleted

## 2016-05-19 NOTE — Telephone Encounter (Signed)
-----   Message from Peggy Constant, MD sent at 05/18/2016  7:52 AM EDT ----- Please inform patient of UTI. Rx e-prescribed  Thanks  Peggy 

## 2016-05-19 NOTE — Telephone Encounter (Signed)
Informed pt of urine cx result and medication treatment, instructed on medication use.

## 2016-06-08 ENCOUNTER — Encounter: Payer: Medicaid Other | Admitting: Family Medicine

## 2016-06-25 ENCOUNTER — Telehealth: Payer: Self-pay | Admitting: *Deleted

## 2016-06-25 ENCOUNTER — Encounter: Payer: Self-pay | Admitting: Family Medicine

## 2016-06-25 ENCOUNTER — Encounter (HOSPITAL_COMMUNITY): Payer: Self-pay | Admitting: Family Medicine

## 2016-06-25 DIAGNOSIS — O0990 Supervision of high risk pregnancy, unspecified, unspecified trimester: Secondary | ICD-10-CM

## 2016-06-25 NOTE — Addendum Note (Signed)
Addended by: Arne ClevelandHUTCHINSON, Mamye Bolds J on: 06/25/2016 01:56 PM   Modules accepted: Orders

## 2016-06-25 NOTE — Telephone Encounter (Signed)
-----   Message from Olevia BowensJacinda S Battle sent at 06/25/2016 11:50 AM EDT ----- Regarding: US Order Need order for mfm anatomy ultrasound please ma'am, missed last appt on 8/21, called to reschedule she is already 18wks

## 2016-07-03 ENCOUNTER — Encounter (HOSPITAL_COMMUNITY): Payer: Self-pay

## 2016-07-07 ENCOUNTER — Ambulatory Visit (HOSPITAL_COMMUNITY): Admission: RE | Admit: 2016-07-07 | Payer: Medicaid Other | Source: Ambulatory Visit

## 2016-07-07 ENCOUNTER — Ambulatory Visit (HOSPITAL_COMMUNITY)
Admission: RE | Admit: 2016-07-07 | Discharge: 2016-07-07 | Disposition: A | Discharge status: Home / Self Care | Payer: Medicaid Other | Source: Ambulatory Visit | Attending: Family Medicine | Admitting: Family Medicine

## 2016-07-07 ENCOUNTER — Other Ambulatory Visit: Payer: Self-pay | Admitting: Family Medicine

## 2016-07-07 VITALS — BP 127/91 | HR 97 | Wt 310.0 lb

## 2016-07-07 DIAGNOSIS — Z1389 Encounter for screening for other disorder: Secondary | ICD-10-CM

## 2016-07-07 DIAGNOSIS — Z36 Encounter for antenatal screening of mother: Secondary | ICD-10-CM | POA: Diagnosis not present

## 2016-07-07 DIAGNOSIS — Z3A20 20 weeks gestation of pregnancy: Secondary | ICD-10-CM

## 2016-07-07 DIAGNOSIS — O0990 Supervision of high risk pregnancy, unspecified, unspecified trimester: Secondary | ICD-10-CM

## 2016-07-07 DIAGNOSIS — O162 Unspecified maternal hypertension, second trimester: Secondary | ICD-10-CM | POA: Diagnosis not present

## 2016-07-07 DIAGNOSIS — O169 Unspecified maternal hypertension, unspecified trimester: Secondary | ICD-10-CM

## 2016-07-07 DIAGNOSIS — Z363 Encounter for antenatal screening for malformations: Secondary | ICD-10-CM

## 2016-07-10 ENCOUNTER — Ambulatory Visit (INDEPENDENT_AMBULATORY_CARE_PROVIDER_SITE_OTHER): Payer: Medicaid Other | Admitting: Family Medicine

## 2016-07-10 VITALS — BP 155/93 | HR 99 | Wt 313.0 lb

## 2016-07-10 DIAGNOSIS — O09522 Supervision of elderly multigravida, second trimester: Secondary | ICD-10-CM

## 2016-07-10 DIAGNOSIS — O34219 Maternal care for unspecified type scar from previous cesarean delivery: Secondary | ICD-10-CM

## 2016-07-10 DIAGNOSIS — O162 Unspecified maternal hypertension, second trimester: Secondary | ICD-10-CM

## 2016-07-10 DIAGNOSIS — R87611 Atypical squamous cells cannot exclude high grade squamous intraepithelial lesion on cytologic smear of cervix (ASC-H): Secondary | ICD-10-CM

## 2016-07-10 DIAGNOSIS — J0110 Acute frontal sinusitis, unspecified: Secondary | ICD-10-CM

## 2016-07-10 DIAGNOSIS — O09892 Supervision of other high risk pregnancies, second trimester: Secondary | ICD-10-CM

## 2016-07-10 MED ORDER — AZITHROMYCIN 250 MG PO TABS: 250.0000 mg | ORAL_TABLET | Freq: Every day | ORAL | 0 refills | Status: DC

## 2016-07-10 NOTE — Progress Notes (Signed)
   PRENATAL VISIT NOTE  Subjective:  Jackie BrucknerJennifer L Brown is a 35 y.o. [id number] at 1812w6d being seen today for ongoing prenatal care.  She is currently monitored for the following issues for this high-risk pregnancy and has Obesity, unspecified; GERD; GALLSTONES; Acquired cyst of kidney; Anxiety; Hydronephrosis, right; Nephrolithiasis; Paroxysmal hypertension; Chronic fatigue; AMA (advanced maternal age) multigravida 35+; Previous cesarean delivery affecting pregnancy, antepartum; Vitamin D insufficiency; Obesity in pregnancy, antepartum; Hypertension complicating pregnancy; Episodic paroxysmal anxiety disorder; Excess, menstruation; Kidney stone; Insomnia; Allergic rhinitis; Supervision of other high risk pregnancy, antepartum; History of gestational diabetes in prior pregnancy, currently pregnant; and Atypical squamous cells cannot exclude high grade squamous intraepithelial lesion on cytologic smear of cervix (ASC-H) on her problem list.  Patient reports no complaints.  Contractions: Not present. Vag. Bleeding: None.  Movement: Present. Denies leaking of fluid.   The following portions of the patient's history were reviewed and updated as appropriate: allergies, current medications, past family history, past medical history, past social history, past surgical history and problem list. Problem list updated.  Objective:   Vitals:   07/10/16 1049  BP: (!) 155/93  Pulse: 99  Weight: (!) 313 lb (142 kg)    Fetal Status: Fetal Heart Rate (bpm): 154   Movement: Present     General:  Alert, oriented and cooperative. Patient is in no acute distress.  Skin: Skin is warm and dry. No rash noted.   Cardiovascular: Normal heart rate noted  Respiratory: Normal respiratory effort, no problems with respiration noted  Abdomen: Soft, gravid, appropriate for gestational age. Pain/Pressure: Absent     Pelvic:  Cervical exam deferred        Extremities: Normal range of motion.  Edema: None  Mental Status: Normal  mood and affect. Normal behavior. Normal judgment and thought content.   Urinalysis: Urine Protein: Negative Urine Glucose: Negative  Assessment and Plan:  Pregnancy: [id number] at 3912w6d  1. Hypertension complicating pregnancy, second trimester Reports normal BP's at home--will defer treatment for now  2. AMA (advanced maternal age) multigravida 35+, second trimester Declined genetics  3. Supervision of other high risk pregnancy, antepartum, second trimester Continue routine prenatal care. Normal anatomy  4. Previous cesarean delivery affecting pregnancy, antepartum Desires TOLAC  5. Atypical squamous cells cannot exclude high grade squamous intraepithelial lesion on cytologic smear of cervix (ASC-H) Declines Colposcopy at this time--desires repeat pap postpartum  6. Acute frontal sinusitis, recurrence not specified Given length of sx's will treat with abx - azithromycin (ZITHROMAX) 250 MG tablet; Take 1 tablet (250 mg total) by mouth daily. Take 2 on the first day, then 1 daily  Dispense: 6 tablet; Refill: 0  General obstetric precautions including but not limited to vaginal bleeding, contractions, leaking of fluid and fetal movement were reviewed in detail with the patient. Please refer to After Visit Summary for other counseling recommendations.  Return in 4 weeks (on 08/07/2016).  Reva Boresanya S Pratt, MD

## 2016-07-10 NOTE — Patient Instructions (Signed)
Second Trimester of Pregnancy The second trimester is from week 13 through week 28, months 4 through 6. The second trimester is often a time when you feel your [place]. Your body has also adjusted to being pregnant, and you begin to feel better physically. Usually, morning sickness has lessened or quit completely, you may have more energy, and you may have an increase in appetite. The second trimester is also a time when the fetus is growing rapidly. At the end of the sixth month, the fetus is about 9 inches long and weighs about 1 pounds. You will likely begin to feel the baby move (quickening) between 18 and 20 weeks of the pregnancy. BODY CHANGES Your body goes through many changes during pregnancy. The changes vary from woman to woman.   Your weight will continue to increase. You will notice your lower abdomen bulging out.  You may begin to get stretch marks on your hips, abdomen, and breasts.  You may develop headaches that can be relieved by medicines approved by your health care provider.  You may urinate more often because the fetus is pressing on your bladder.  You may develop or continue to have heartburn as a result of your pregnancy.  You may develop constipation because certain hormones are causing the muscles that push waste through your intestines to slow down.  You may develop hemorrhoids or swollen, bulging veins (varicose veins).  You may have back pain because of the weight gain and pregnancy hormones relaxing your joints between the bones in your pelvis and as a result of a shift in weight and the muscles that support your balance.  Your breasts will continue to grow and be tender.  Your gums may bleed and may be sensitive to brushing and flossing.  Dark spots or blotches (chloasma, mask of pregnancy) may develop on your face. This will likely fade after the baby is born.  A dark line from your belly button to the pubic area (linea nigra) may appear. This will likely  fade after the baby is born.  You may have changes in your hair. These can include thickening of your hair, rapid growth, and changes in texture. Some women also have hair loss during or after pregnancy, or hair that feels dry or thin. Your hair will most likely return to normal after your baby is born. WHAT TO EXPECT AT YOUR PRENATAL VISITS During a routine prenatal visit:  You will be weighed to make sure you and the fetus are growing normally.  Your blood pressure will be taken.  Your abdomen will be measured to track your baby's growth.  The fetal heartbeat will be listened to.  Any test results from the previous visit will be discussed. Your health care provider may ask you:  How you are feeling.  If you are feeling the baby move.  If you have had any abnormal symptoms, such as leaking fluid, bleeding, severe headaches, or abdominal cramping.  If you are using any tobacco products, including cigarettes, chewing tobacco, and electronic cigarettes.  If you have any questions. Other tests that may be performed during your second trimester include:  Blood tests that check for:  Low iron levels (anemia).  Gestational diabetes (between 24 and 28 weeks).  Rh antibodies.  Urine tests to check for infections, diabetes, or protein in the urine.  An ultrasound to confirm the proper growth and development of the baby.  An amniocentesis to check for possible genetic problems.  Fetal screens for spina bifida   and Down syndrome.  HIV (human immunodeficiency virus) testing. Routine prenatal testing includes screening for HIV, unless you choose not to have this test. HOME CARE INSTRUCTIONS   Avoid all smoking, herbs, alcohol, and unprescribed drugs. These chemicals affect the formation and growth of the baby.  Do not use any tobacco products, including cigarettes, chewing tobacco, and electronic cigarettes. If you need help quitting, ask your health care provider. You may receive  counseling support and other resources to help you quit.  Follow your health care provider's instructions regarding medicine use. There are medicines that are either safe or unsafe to take during pregnancy.  Exercise only as directed by your health care provider. Experiencing uterine cramps is a good sign to stop exercising.  Continue to eat regular, healthy meals.  Wear a good support bra for breast tenderness.  Do not use hot tubs, steam rooms, or saunas.  Wear your seat belt at all times when driving.  Avoid raw meat, uncooked cheese, cat litter boxes, and soil used by cats. These carry germs that can cause birth defects in the baby.  Take your prenatal vitamins.  Take 1500-2000 mg of calcium daily starting at the 20th week of pregnancy until you deliver your baby.  Try taking a stool softener (if your health care provider approves) if you develop constipation. Eat more high-fiber foods, such as fresh vegetables or fruit and whole grains. Drink plenty of fluids to keep your urine clear or pale yellow.  Take warm sitz baths to soothe any pain or discomfort caused by hemorrhoids. Use hemorrhoid cream if your health care provider approves.  If you develop varicose veins, wear support hose. Elevate your feet for 15 minutes, 3-4 times a day. Limit salt in your diet.  Avoid heavy lifting, wear low heel shoes, and practice good posture.  Rest with your legs elevated if you have leg cramps or low back pain.  Visit your dentist if you have not gone yet during your pregnancy. Use a soft toothbrush to brush your teeth and be gentle when you floss.  A sexual relationship may be continued unless your health care provider directs you otherwise.  Continue to go to all your prenatal visits as directed by your health care provider. SEEK MEDICAL CARE IF:   You have dizziness.  You have mild pelvic cramps, pelvic pressure, or nagging pain in the abdominal area.  You have persistent nausea,  vomiting, or diarrhea.  You have a bad smelling vaginal discharge.  You have pain with urination. SEEK IMMEDIATE MEDICAL CARE IF:   You have a fever.  You are leaking fluid from your vagina.  You have spotting or bleeding from your vagina.  You have severe abdominal cramping or pain.  You have rapid weight gain or loss.  You have shortness of breath with chest pain.  You notice sudden or extreme swelling of your face, hands, ankles, feet, or legs.  You have not felt your baby move in over an hour.  You have severe headaches that do not go away with medicine.  You have vision changes.   This information is not intended to replace advice given to you by your health care provider. Make sure you discuss any questions you have with your health care provider.   Document Released: 09/29/2001 Document Revised: 10/26/2014 Document Reviewed: 12/06/2012 Elsevier Interactive Patient Education 2016 Elsevier Inc.   Breastfeeding Deciding to breastfeed is one of the [place] choices you can make for you and your baby. A change   in hormones during pregnancy causes your breast tissue to grow and increases the number and size of your milk ducts. These hormones also allow proteins, sugars, and fats from your blood supply to make breast milk in your milk-producing glands. Hormones prevent breast milk from being released before your baby is born as well as prompt milk flow after birth. Once breastfeeding has begun, thoughts of your baby, as well as his or her sucking or crying, can stimulate the release of milk from your milk-producing glands.  BENEFITS OF BREASTFEEDING For Your Baby  Your first milk (colostrum) helps your baby's digestive system function better.  There are antibodies in your milk that help your baby fight off infections.  Your baby has a lower incidence of asthma, allergies, and sudden infant death syndrome.  The nutrients in breast milk are better for your baby than infant  formulas and are designed uniquely for your baby's needs.  Breast milk improves your baby's brain development.  Your baby is less likely to develop other conditions, such as childhood obesity, asthma, or type 2 diabetes mellitus. For You  Breastfeeding helps to create a very special bond between you and your baby.  Breastfeeding is convenient. Breast milk is always available at the correct temperature and costs nothing.  Breastfeeding helps to burn calories and helps you lose the weight gained during pregnancy.  Breastfeeding makes your uterus contract to its prepregnancy size faster and slows bleeding (lochia) after you give birth.   Breastfeeding helps to lower your risk of developing type 2 diabetes mellitus, osteoporosis, and breast or ovarian cancer later in life. SIGNS THAT YOUR BABY IS HUNGRY Early Signs of Hunger  Increased alertness or activity.  Stretching.  Movement of the head from side to side.  Movement of the head and opening of the mouth when the corner of the mouth or cheek is stroked (rooting).  Increased sucking sounds, smacking lips, cooing, sighing, or squeaking.  Hand-to-mouth movements.  Increased sucking of fingers or hands. Late Signs of Hunger  Fussing.  Intermittent crying. Extreme Signs of Hunger Signs of extreme hunger will require calming and consoling before your baby will be able to breastfeed successfully. Do not wait for the following signs of extreme hunger to occur before you initiate breastfeeding:  Restlessness.  A loud, strong cry.  Screaming. BREASTFEEDING BASICS Breastfeeding Initiation  Find a comfortable place to sit or lie down, with your neck and back well supported.  Place a pillow or rolled up blanket under your baby to bring him or her to the level of your breast (if you are seated). Nursing pillows are specially designed to help support your arms and your baby while you breastfeed.  Make sure that your baby's  abdomen is facing your abdomen.  Gently massage your breast. With your fingertips, massage from your chest wall toward your nipple in a circular motion. This encourages milk flow. You may need to continue this action during the feeding if your milk flows slowly.  Support your breast with 4 fingers underneath and your thumb above your nipple. Make sure your fingers are well away from your nipple and your baby's mouth.  Stroke your baby's lips gently with your finger or nipple.  When your baby's mouth is open wide enough, quickly bring your baby to your breast, placing your entire nipple and as much of the colored area around your nipple (areola) as possible into your baby's mouth.  More areola should be visible above your baby's upper lip than   below the lower lip.  Your baby's tongue should be between his or her lower gum and your breast.  Ensure that your baby's mouth is correctly positioned around your nipple (latched). Your baby's lips should create a seal on your breast and be turned out (everted).  It is common for your baby to suck about 2-3 minutes in order to start the flow of breast milk. Latching Teaching your baby how to latch on to your breast properly is very important. An improper latch can cause nipple pain and decreased milk supply for you and poor weight gain in your baby. Also, if your baby is not latched onto your nipple properly, he or she may swallow some air during feeding. This can make your baby fussy. Burping your baby when you switch breasts during the feeding can help to get rid of the air. However, teaching your baby to latch on properly is still the [place] way to prevent fussiness from swallowing air while breastfeeding. Signs that your baby has successfully latched on to your nipple:  Silent tugging or silent sucking, without causing you pain.  Swallowing heard between every 3-4 sucks.  Muscle movement above and in front of his or her ears while sucking. Signs  that your baby has not successfully latched on to nipple:  Sucking sounds or smacking sounds from your baby while breastfeeding.  Nipple pain. If you think your baby has not latched on correctly, slip your finger into the corner of your baby's mouth to break the suction and place it between your baby's gums. Attempt breastfeeding initiation again. Signs of Successful Breastfeeding Signs from your baby:  A gradual decrease in the number of sucks or complete cessation of sucking.  Falling asleep.  Relaxation of his or her body.  Retention of a small amount of milk in his or her mouth.  Letting go of your breast by himself or herself. Signs from you:  Breasts that have increased in firmness, weight, and size 1-3 hours after feeding.  Breasts that are softer immediately after breastfeeding.  Increased milk volume, as well as a change in milk consistency and color by the fifth day of breastfeeding.  Nipples that are not sore, cracked, or bleeding. Signs That Your Baby is Getting Enough Milk  Wetting at least 3 diapers in a 24-hour period. The urine should be clear and pale yellow by age 5 days.  At least 3 stools in a 24-hour period by age 5 days. The stool should be soft and yellow.  At least 3 stools in a 24-hour period by age 7 days. The stool should be seedy and yellow.  No loss of weight greater than 10% of birth weight during the first 3 days of age.  Average weight gain of 4-7 ounces (113-198 g) per week after age 4 days.  Consistent daily weight gain by age 5 days, without weight loss after the age of 2 weeks. After a feeding, your baby may spit up a small amount. This is common. BREASTFEEDING FREQUENCY AND DURATION Frequent feeding will help you make more milk and can prevent sore nipples and breast engorgement. Breastfeed when you feel the need to reduce the fullness of your breasts or when your baby shows signs of hunger. This is called "breastfeeding on demand." Avoid  introducing a pacifier to your baby while you are working to establish breastfeeding (the first 4-6 weeks after your baby is born). After this time you may choose to use a pacifier. Research has shown that   pacifier use during the first year of a baby's life decreases the risk of sudden infant death syndrome (SIDS). Allow your baby to feed on each breast as long as he or she wants. Breastfeed until your baby is finished feeding. When your baby unlatches or falls asleep while feeding from the first breast, offer the second breast. Because newborns are often sleepy in the first few weeks of life, you may need to awaken your baby to get him or her to feed. Breastfeeding times will vary from baby to baby. However, the following rules can serve as a guide to help you ensure that your baby is properly fed:  Newborns (babies 4 weeks of age or younger) may breastfeed every 1-3 hours.  Newborns should not go longer than 3 hours during the day or 5 hours during the night without breastfeeding.  You should breastfeed your baby a minimum of 8 times in a 24-hour period until you begin to introduce solid foods to your baby at around 6 months of age. BREAST MILK PUMPING Pumping and storing breast milk allows you to ensure that your baby is exclusively fed your breast milk, even at times when you are unable to breastfeed. This is especially important if you are going back to work while you are still breastfeeding or when you are not able to be present during feedings. Your lactation consultant can give you guidelines on how long it is safe to store breast milk. A breast pump is a machine that allows you to pump milk from your breast into a sterile bottle. The pumped breast milk can then be stored in a refrigerator or freezer. Some breast pumps are operated by hand, while others use electricity. Ask your lactation consultant which type will work [place] for you. Breast pumps can be purchased, but some hospitals and  breastfeeding support groups lease breast pumps on a monthly basis. A lactation consultant can teach you how to hand express breast milk, if you prefer not to use a pump. CARING FOR YOUR BREASTS WHILE YOU BREASTFEED Nipples can become dry, cracked, and sore while breastfeeding. The following recommendations can help keep your breasts moisturized and healthy:  Avoid using soap on your nipples.  Wear a supportive bra. Although not required, special nursing bras and tank tops are designed to allow access to your breasts for breastfeeding without taking off your entire bra or top. Avoid wearing underwire-style bras or extremely tight bras.  Air dry your nipples for 3-4minutes after each feeding.  Use only cotton bra pads to absorb leaked breast milk. Leaking of breast milk between feedings is normal.  Use lanolin on your nipples after breastfeeding. Lanolin helps to maintain your skin's normal moisture barrier. If you use pure lanolin, you do not need to wash it off before feeding your baby again. Pure lanolin is not toxic to your baby. You may also hand express a few drops of breast milk and gently massage that milk into your nipples and allow the milk to air dry. In the first few weeks after giving birth, some women experience extremely full breasts (engorgement). Engorgement can make your breasts feel heavy, warm, and tender to the touch. Engorgement peaks within 3-5 days after you give birth. The following recommendations can help ease engorgement:  Completely empty your breasts while breastfeeding or pumping. You may want to start by applying warm, moist heat (in the shower or with warm water-soaked hand towels) just before feeding or pumping. This increases circulation and helps the milk   flow. If your baby does not completely empty your breasts while breastfeeding, pump any extra milk after he or she is finished.  Wear a snug bra (nursing or regular) or tank top for 1-2 days to signal your body  to slightly decrease milk production.  Apply ice packs to your breasts, unless this is too uncomfortable for you.  Make sure that your baby is latched on and positioned properly while breastfeeding. If engorgement persists after 48 hours of following these recommendations, contact your health care provider or a lactation consultant. OVERALL HEALTH CARE RECOMMENDATIONS WHILE BREASTFEEDING  Eat healthy foods. Alternate between meals and snacks, eating 3 of each per day. Because what you eat affects your breast milk, some of the foods may make your baby more irritable than usual. Avoid eating these foods if you are sure that they are negatively affecting your baby.  Drink milk, fruit juice, and water to satisfy your thirst (about 10 glasses a day).  Rest often, relax, and continue to take your prenatal vitamins to prevent fatigue, stress, and anemia.  Continue breast self-awareness checks.  Avoid chewing and smoking tobacco. Chemicals from cigarettes that pass into breast milk and exposure to secondhand smoke may harm your baby.  Avoid alcohol and drug use, including marijuana. Some medicines that may be harmful to your baby can pass through breast milk. It is important to ask your health care provider before taking any medicine, including all over-the-counter and prescription medicine as well as vitamin and herbal supplements. It is possible to become pregnant while breastfeeding. If birth control is desired, ask your health care provider about options that will be safe for your baby. SEEK MEDICAL CARE IF:  You feel like you want to stop breastfeeding or have become frustrated with breastfeeding.  You have painful breasts or nipples.  Your nipples are cracked or bleeding.  Your breasts are red, tender, or warm.  You have a swollen area on either breast.  You have a fever or chills.  You have nausea or vomiting.  You have drainage other than breast milk from your nipples.  Your  breasts do not become full before feedings by the fifth day after you give birth.  You feel sad and depressed.  Your baby is too sleepy to eat well.  Your baby is having trouble sleeping.   Your baby is wetting less than 3 diapers in a 24-hour period.  Your baby has less than 3 stools in a 24-hour period.  Your baby's skin or the white part of his or her eyes becomes yellow.   Your baby is not gaining weight by 5 days of age. SEEK IMMEDIATE MEDICAL CARE IF:  Your baby is overly tired (lethargic) and does not want to wake up and feed.  Your baby develops an unexplained fever.   This information is not intended to replace advice given to you by your health care provider. Make sure you discuss any questions you have with your health care provider.   Document Released: 10/05/2005 Document Revised: 06/26/2015 Document Reviewed: 03/29/2013 Elsevier Interactive Patient Education 2016 Elsevier Inc.  

## 2016-07-14 LAB — CULTURE, OB URINE

## 2016-08-17 ENCOUNTER — Encounter: Payer: Medicaid Other | Admitting: Family Medicine

## 2016-08-18 ENCOUNTER — Encounter (HOSPITAL_COMMUNITY): Payer: Self-pay

## 2016-08-18 ENCOUNTER — Ambulatory Visit (HOSPITAL_COMMUNITY)
Admission: RE | Admit: 2016-08-18 | Discharge: 2016-08-18 | Disposition: A | Discharge status: Home / Self Care | Payer: Medicaid Other | Source: Ambulatory Visit | Attending: Family Medicine | Admitting: Family Medicine

## 2016-11-25 ENCOUNTER — Telehealth: Payer: Self-pay | Admitting: *Deleted

## 2016-11-25 NOTE — Telephone Encounter (Signed)
error 

## 2016-11-26 ENCOUNTER — Encounter: Payer: Self-pay | Admitting: *Deleted

## 2017-02-19 ENCOUNTER — Encounter (HOSPITAL_COMMUNITY): Payer: Self-pay

## 2017-03-23 ENCOUNTER — Ambulatory Visit: Payer: Medicaid Other | Admitting: Family Medicine

## 2017-04-13 ENCOUNTER — Ambulatory Visit (INDEPENDENT_AMBULATORY_CARE_PROVIDER_SITE_OTHER): Payer: Medicaid Other | Admitting: Family Medicine

## 2017-04-13 ENCOUNTER — Encounter: Payer: Self-pay | Admitting: Family Medicine

## 2017-04-13 VITALS — BP 148/98 | HR 74 | Resp 20 | Ht 73.0 in | Wt 311.0 lb

## 2017-04-13 DIAGNOSIS — F419 Anxiety disorder, unspecified: Secondary | ICD-10-CM

## 2017-04-13 DIAGNOSIS — R87611 Atypical squamous cells cannot exclude high grade squamous intraepithelial lesion on cytologic smear of cervix (ASC-H): Secondary | ICD-10-CM

## 2017-04-13 DIAGNOSIS — Z124 Encounter for screening for malignant neoplasm of cervix: Secondary | ICD-10-CM

## 2017-04-13 DIAGNOSIS — Z1151 Encounter for screening for human papillomavirus (HPV): Secondary | ICD-10-CM

## 2017-04-13 NOTE — Progress Notes (Signed)
   Subjective:    Patient ID: [name] is a 36 y.o. female presenting with Repeat Pap Smear  on 04/13/2017  HPI: Here for repeat pap. 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 She has had another VBAC of term infant with HTN. No longer on meds. PHQ9 is 13. Has anxiety. Declines meds.  Review of Systems  Constitutional: Negative for chills and fever.  Respiratory: Negative for shortness of breath.   Cardiovascular: Negative for chest pain.  Gastrointestinal: Negative for abdominal pain, nausea and vomiting.  Genitourinary: Negative for dysuria.  Skin: Negative for rash.      Objective:    BP (!) 148/98   Pulse 74   Resp 20   Ht 6\' 1"  (1.854 m)   Wt (!) 311 lb (141.1 kg)   LMP 03/17/2017   Breastfeeding? No   BMI 41.03 kg/m  Physical Exam  Constitutional: She is oriented to person, place, and time. She appears well-developed and well-nourished. No distress.  HENT:  Head: Normocephalic and atraumatic.  Eyes: No scleral icterus.  Neck: Neck supple.  Cardiovascular: Normal rate.   Pulmonary/Chest: Effort normal.  Abdominal: Soft.  Neurological: She is alert and oriented to person, place, and time.  Skin: Skin is warm and dry.  Psychiatric: She has a normal mood and affect.        Assessment & Plan:   Problem List Items Addressed This Visit      Unprioritized   Anxiety    Trial of Mindfulness training.      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      Relevant Orders   Cytology - PAP      Total face-to-face time with patient: 25 minutes. Over 50% of encounter was spent on counseling and coordination of care.  Reva Boresanya S [place] 04/13/2017 3:29 PM

## 2017-04-13 NOTE — Assessment & Plan Note (Signed)
Repeat pap today--f/u prn. 

## 2017-04-13 NOTE — Assessment & Plan Note (Signed)
Trial of Mindfulness training.

## 2017-04-15 LAB — CYTOLOGY - PAP
Diagnosis: NEGATIVE
HPV: NOT DETECTED

## 2017-05-17 ENCOUNTER — Encounter: Payer: Self-pay | Admitting: *Deleted

## 2017-05-17 ENCOUNTER — Telehealth: Payer: Self-pay | Admitting: *Deleted

## 2017-05-17 NOTE — Telephone Encounter (Signed)
-----   Message from Lind[place] SparHeather L Bacon, [place] sent at 05/17/2017  2:41 PM EDT ----- Regarding: PAP results Contact: [phone] Please call pt with results from PAP

## 2017-05-17 NOTE — Telephone Encounter (Signed)
Tried calling pt - no answer and no voicemail. Letter with pap results are automatically sent but will generate another letter and send.

## 2017-05-17 NOTE — Telephone Encounter (Signed)
Pap results sent to MyChart in June and viewed by pt today.

## 2017-10-05 ENCOUNTER — Telehealth: Payer: Self-pay

## 2017-10-05 NOTE — Telephone Encounter (Signed)
Patient called to schedule an appointment for acute symptoms of pressure in her neck and occasional dizziness. Patient states these symptoms have occurred intermittently for several weeks and has worsened in the past 2 weeks. Patient denies any headaches, blurred vision, numbness or tingling, shortness of breath or chest pain. Patient believes  the symptoms may be related to her thyroid, as she reports having problems with this in the past. Patient states she was put on blood pressure medication through her GYN several months ago after she gave birth, but it was discontinued due to blood pressure readings improving. Patient restarted this medication a few days ago, just in case symptoms were related to elevated blood pressure. Patient has not checked her blood pressure recently. I advised her that she should check her blood pressure, and if it is greater than 140/90 or lower than 90/60 to call back. Patient was not at home at the moment and told me that she would call back if the reading was abnormal. Appointment for evaluation has been scheduled for tomorrow at 9:45am. Patient was advised to go to the ER if symptoms worsened. Patient verbally voiced understanding.

## 2017-10-06 ENCOUNTER — Ambulatory Visit: Payer: Medicaid Other | Admitting: Family Medicine

## 2017-10-06 ENCOUNTER — Encounter: Payer: Self-pay | Admitting: Family Medicine

## 2017-10-06 VITALS — BP 150/100 | HR 106 | Temp 98.2°F | Resp 16 | Ht 73.0 in | Wt 323.0 lb

## 2017-10-06 DIAGNOSIS — M25532 Pain in left wrist: Secondary | ICD-10-CM

## 2017-10-06 DIAGNOSIS — N926 Irregular menstruation, unspecified: Secondary | ICD-10-CM

## 2017-10-06 DIAGNOSIS — I10 Essential (primary) hypertension: Secondary | ICD-10-CM

## 2017-10-06 DIAGNOSIS — M25531 Pain in right wrist: Secondary | ICD-10-CM

## 2017-10-06 DIAGNOSIS — R5383 Other fatigue: Secondary | ICD-10-CM | POA: Diagnosis not present

## 2017-10-06 MED ORDER — LABETALOL HCL 100 MG PO TABS: 100.0000 mg | ORAL_TABLET | Freq: Two times a day (BID) | ORAL | 0 refills | Status: DC

## 2017-10-06 NOTE — Progress Notes (Signed)
     Patient: [name] Female    DOB: 03/11/1981   36 y.o.   MRN: [id number] Visit Date: 10/06/2017  Today's Provider: Donald Fisher, MD   Chief Complaint  Patient presents with  . Hypertension   Subjective:    Patient is here for acute symptoms of pressure in her neck and occasional dizziness. Patient states these symptoms have occurred intermittently for several weeks and has worsened in the past 2 weeks. Patient denies any headaches, blurred vision, numbness or tingling, shortness of breath or chest pain. Patient believes  the symptoms may be related to her thyroid, as she reports having problems with this in the past. Patient states she was put on blood pressure medication through her GYN several months ago after she gave birth, but it was discontinued due to blood pressure readings improving. Patient restarted this medication a few days ago, just in case symptoms were related to elevated blood pressure.   States she feels that symptoms may be related to her thyroid. She states she has not been able to lose any weight since her last pregnancy.  She states she had similar symptoms prior to her pregnancies, but symptoms resolve while pregnant. Last pregnancy ended about a year ago. She was started on labetalol near the end of term for PIH  States having bilateral elbow and wrist pain and swelling for several weeks. No known injury. Has not taken any medications for this. Tried some homeopathic treatments.     Allergies  Allergen Reactions  . Ivp Dye [Iodinated Diagnostic Agents] Anaphylaxis  . Iodine   . Phenergan Fortis [Promethazine] Itching    Itching/pins and needles for hours.     Current Outpatient Medications:  .  ALPRAZolam (XANAX) 1 MG tablet, Take 1-2 tablets by mouth every 6 (six) hours as needed., Disp: , Rfl:   Review of Systems  Constitutional: Negative for appetite change, chills, fatigue and fever.  Respiratory: Negative for chest tightness and  shortness of breath.   Cardiovascular: Negative for chest pain and palpitations.  Gastrointestinal: Negative for abdominal pain, nausea and vomiting.  Neurological: Negative for dizziness and weakness.    Social History   Tobacco Use  . Smoking status: Former Smoker  . Smokeless tobacco: Never Used  Substance Use Topics  . Alcohol use: No    Alcohol/week: 0.0 oz   Objective:   BP (!) 150/100 (BP Location: Right Arm, Patient Position: Sitting, Cuff Size: Large)   Pulse (!) 106   Temp 98.2 F (36.8 C) (Oral)   Resp 16   Ht 6' 1" (1.854 m)   Wt (!) 323 lb (146.5 kg)   SpO2 99%   BMI 42.61 kg/m  Vitals:   10/06/17 1007  BP: (!) 150/100  Pulse: (!) 106  Resp: 16  Temp: 98.2 F (36.8 C)  TempSrc: Oral  SpO2: 99%  Weight: (!) 323 lb (146.5 kg)  Height: 6' 1" (1.854 m)     Physical Exam  General Appearance:    Alert, cooperative, no distress, morbidly obese  Eyes:    PERRL, conjunctiva/corneas clear, EOM's intact       Lungs:     Clear to auscultation bilaterally, respirations unlabored  Heart:    Regular rate and rhythm  Neurologic:   Awake, alert, oriented x 3. No apparent focal neurological           defect.           Assessment & Plan:       1. Other fatigue  - COMPLETE METABOLIC PANEL WITH GFR - CBC - TSH - T4, free  2. Irregular menses  - hCG, serum, qualitative  3. Pain of both wrist joints  - Rheumatoid Factor - Sed Rate (ESR)  4. Hypertension, unspecified type She is to restart labetalol (NORMODYNE) 100 MG tablet; Take 1 tablet (100 mg total) by mouth 2 (two) times daily, which was originally prescribed during her last pregnancy.       Donald Fisher, MD  [facility]  

## 2017-10-07 LAB — COMPLETE METABOLIC PANEL WITH GFR
AG Ratio: 1.7 (calc) (ref 1.0–2.5)
ALT: 13 U/L (ref 6–29)
AST: 13 U/L (ref 10–30)
Albumin: 4.2 g/dL (ref 3.6–5.1)
Alkaline phosphatase (APISO): 48 U/L (ref 33–115)
BUN: 15 mg/dL (ref 7–25)
CO2: 26 mmol/L (ref 20–32)
Calcium: 9.1 mg/dL (ref 8.6–10.2)
Chloride: 106 mmol/L (ref 98–110)
Creat: 0.8 mg/dL (ref 0.50–1.10)
GFR, Est African American: 109 mL/min/{1.73_m2} (ref >=60)
GFR, Est Non African American: 94 mL/min/{1.73_m2} (ref >=60)
Globulin: 2.5 g/dL (calc) (ref 1.9–3.7)
Glucose, Bld: 104 mg/dL (ref 65–139)
Potassium: 4 mmol/L (ref 3.5–5.3)
Sodium: 138 mmol/L (ref 135–146)
Total Bilirubin: 0.4 mg/dL (ref 0.2–1.2)
Total Protein: 6.7 g/dL (ref 6.1–8.1)

## 2017-10-07 LAB — CBC
HCT: 37.9 % (ref 35.0–45.0)
Hemoglobin: 12.6 g/dL (ref 11.7–15.5)
MCH: 27 pg (ref 27.0–33.0)
MCHC: 33.2 g/dL (ref 32.0–36.0)
MCV: 81.2 fL (ref 80.0–100.0)
MPV: 10.6 fL (ref 7.5–12.5)
Platelets: 232 10*3/uL (ref 140–400)
RBC: 4.67 10*6/uL (ref 3.80–5.10)
RDW: 12.9 % (ref 11.0–15.0)
WBC: 6.9 10*3/uL (ref 3.8–10.8)

## 2017-10-07 LAB — RHEUMATOID FACTOR: Rhuematoid fact SerPl-aCnc: <14 IU/mL (ref <14)

## 2017-10-07 LAB — T4, FREE: Free T4: 1.1 ng/dL (ref 0.8–1.8)

## 2017-10-07 LAB — HCG, SERUM, QUALITATIVE: Preg, Serum: NEGATIVE

## 2017-10-07 LAB — SEDIMENTATION RATE: Sed Rate: 22 mm/h — ABNORMAL HIGH (ref 0–20)

## 2017-10-07 LAB — TSH: TSH: 0.89 mIU/L

## 2017-12-03 ENCOUNTER — Encounter: Payer: Self-pay | Admitting: Family Medicine

## 2017-12-03 ENCOUNTER — Ambulatory Visit (INDEPENDENT_AMBULATORY_CARE_PROVIDER_SITE_OTHER): Payer: Medicaid Other | Admitting: Family Medicine

## 2017-12-03 VITALS — BP 144/88 | HR 97 | Temp 98.4°F | Resp 16 | Ht 73.0 in | Wt 311.0 lb

## 2017-12-03 DIAGNOSIS — J019 Acute sinusitis, unspecified: Secondary | ICD-10-CM | POA: Diagnosis not present

## 2017-12-03 DIAGNOSIS — F40243 Fear of flying: Secondary | ICD-10-CM

## 2017-12-03 MED ORDER — FLUCONAZOLE 150 MG PO TABS: 150.0000 mg | ORAL_TABLET | Freq: Once | ORAL | 0 refills | Status: AC

## 2017-12-03 MED ORDER — FLUTICASONE PROPIONATE 50 MCG/ACT NA SUSP: 2.0000 | Freq: Every day | NASAL | 3 refills | Status: DC

## 2017-12-03 MED ORDER — ALPRAZOLAM 1 MG PO TABS: 1.0000 mg | ORAL_TABLET | Freq: Four times a day (QID) | ORAL | 1 refills | Status: DC | PRN

## 2017-12-03 MED ORDER — AMOXICILLIN 500 MG PO CAPS: 1000.0000 mg | ORAL_CAPSULE | Freq: Two times a day (BID) | ORAL | 0 refills | Status: AC

## 2017-12-03 NOTE — Progress Notes (Signed)
Patient: [name] Female    DOB: 1981-02-10   37 y.o.   MRN: [id number] Visit Date: 12/03/2017  Today's Provider: Mila Merryonald Duston Smolenski, MD   Chief Complaint  Patient presents with  . Sinusitis   Subjective:    Patient states she has had sinus pressure for 3 weeks. Patient states she has ear congestion, headaches, and sinus pressure. Patient has using otc Flonase with only mild relief.    Sinusitis  This is a new problem. The current episode started 1 to 4 weeks ago (3 weeks). The problem is unchanged. There has been no fever. Associated symptoms include congestion, headaches, sinus pressure and a sore throat. Pertinent negatives include no chills, coughing, diaphoresis, ear pain, hoarse voice, neck pain, shortness of breath, sneezing or swollen glands. Treatments tried: flonase  The treatment provided mild relief.       Allergies  Allergen Reactions  . Ivp Dye [Iodinated Diagnostic Agents] Anaphylaxis  . Iodine   . Phenergan Fortis [Promethazine] Itching    Itching/pins and needles for hours.     Current Outpatient Medications:  .  ALPRAZolam (XANAX) 1 MG tablet, Take 1-2 tablets by mouth every 6 (six) hours as needed., Disp: , Rfl:  .  labetalol (NORMODYNE) 100 MG tablet, Take 1 tablet (100 mg total) by mouth 2 (two) times daily. (Patient not taking: Reported on 12/03/2017), Disp: 1 tablet, Rfl: 0  Review of Systems  Constitutional: Negative for appetite change, chills, diaphoresis, fatigue and fever.  HENT: Positive for congestion, sinus pressure and sore throat. Negative for ear pain, hoarse voice and sneezing.   Respiratory: Negative for cough, chest tightness and shortness of breath.   Cardiovascular: Negative for chest pain and palpitations.  Gastrointestinal: Negative for abdominal pain, nausea and vomiting.  Musculoskeletal: Negative for neck pain.  Neurological: Positive for headaches. Negative for dizziness and weakness.    Social History   Tobacco Use    . Smoking status: Former Games developermoker  . Smokeless tobacco: Never Used  Substance Use Topics  . Alcohol use: No    Alcohol/week: 0.0 oz   Objective:   BP (!) 144/88 (BP Location: Right Arm, Patient Position: Sitting, Cuff Size: Large)   Pulse 97   Temp 98.4 F (36.9 C) (Oral)   Resp 16   Ht 6\' 1"  (1.854 m)   Wt (!) 311 lb (141.1 kg)   SpO2 99%   BMI 41.03 kg/m  Vitals:   12/03/17 1552  BP: (!) 144/88  Pulse: 97  Resp: 16  Temp: 98.4 F (36.9 C)  TempSrc: Oral  SpO2: 99%  Weight: (!) 311 lb (141.1 kg)  Height: 6\' 1"  (1.854 m)     Physical Exam  General Appearance:    Alert, cooperative, no distress  HENT:   bilateral TM normal without fluid or infection, neck without nodes, throat normal without erythema or exudate, frontal sinus tender and nasal mucosa congested  Eyes:    PERRL, conjunctiva/corneas clear, EOM's intact       Lungs:     Clear to auscultation bilaterally, respirations unlabored  Heart:    Regular rate and rhythm  Neurologic:   Awake, alert, oriented x 3. No apparent focal neurological           defect.           Assessment & Plan:     1. Acute sinusitis, recurrence not specified, unspecified location  - fluconazole (DIFLUCAN) 150 MG tablet; Take 1 tablet (150 mg  total) by mouth once for 1 dose.  Dispense: 1 tablet; Refill: 0 - amoxicillin (AMOXIL) 500 MG capsule; Take 2 capsules (1,000 mg total) by mouth 2 (two) times daily for 10 days.  Dispense: 40 capsule; Refill: 0 - fluticasone (FLONASE) 50 MCG/ACT nasal spray; Place 2 sprays into both nostrils daily.  Dispense: 16 g; Refill: 3  2. Anxiety with flying She has upcoming trip to [place] and needs refill - ALPRAZolam (XANAX) 1 MG tablet; Take 1-2 tablets (1-2 mg total) by mouth every 6 (six) hours as needed.  Dispense: 12 tablet; Refill: 1       [name], MD  [facility]

## 2017-12-16 ENCOUNTER — Ambulatory Visit: Payer: Medicaid Other | Admitting: Physician Assistant

## 2017-12-16 ENCOUNTER — Encounter: Payer: Self-pay | Admitting: Physician Assistant

## 2017-12-16 ENCOUNTER — Ambulatory Visit (INDEPENDENT_AMBULATORY_CARE_PROVIDER_SITE_OTHER): Payer: Medicaid Other | Admitting: Family Medicine

## 2017-12-16 VITALS — BP 120/90 | HR 90 | Temp 98.5°F | Resp 16 | Wt 309.0 lb

## 2017-12-16 DIAGNOSIS — H6982 Other specified disorders of Eustachian tube, left ear: Secondary | ICD-10-CM

## 2017-12-16 MED ORDER — PREDNISONE 10 MG (21) PO TBPK: ORAL_TABLET | ORAL | 0 refills | Status: DC

## 2017-12-16 NOTE — Patient Instructions (Signed)
Eustachian Tube Dysfunction The eustachian tube connects the middle ear to the back of the nose. It regulates air pressure in the middle ear by allowing air to move between the ear and nose. It also helps to drain fluid from the middle ear space. When the eustachian tube does not function properly, air pressure, fluid, or both can build up in the middle ear. Eustachian tube dysfunction can affect one or both ears. What are the causes? This condition happens when the eustachian tube becomes blocked or cannot open normally. This may result from:  Ear infections.  Colds and other upper respiratory infections.  Allergies.  Irritation, such as from cigarette smoke or acid from the stomach coming up into the esophagus (gastroesophageal reflux).  Sudden changes in air pressure, such as from descending in an airplane.  Abnormal growths in the nose or throat, such as nasal polyps, tumors, or enlarged tissue at the back of the throat (adenoids).  What increases the risk? This condition may be more likely to develop in people who smoke and people who are overweight. Eustachian tube dysfunction may also be more likely to develop in children, especially children who have:  Certain birth defects of the mouth, such as cleft palate.  Large tonsils and adenoids.  What are the signs or symptoms? Symptoms of this condition may include:  A feeling of fullness in the ear.  Ear pain.  Clicking or popping noises in the ear.  Ringing in the ear.  Hearing loss.  Loss of balance.  Symptoms may get worse when the air pressure around you changes, such as when you travel to an area of high elevation or fly on an airplane. How is this diagnosed? This condition may be diagnosed based on:  Your symptoms.  A physical exam of your ear, nose, and throat.  Tests, such as those that measure: ? The movement of your eardrum (tympanogram). ? Your hearing (audiometry).  How is this treated? Treatment  depends on the cause and severity of your condition. If your symptoms are mild, you may be able to relieve your symptoms by moving air into ("popping") your ears. If you have symptoms of fluid in your ears, treatment may include:  Decongestants.  Antihistamines.  Nasal sprays or ear drops that contain medicines that reduce swelling (steroids).  In some cases, you may need to have a procedure to drain the fluid in your eardrum (myringotomy). In this procedure, a small tube is placed in the eardrum to:  Drain the fluid.  Restore the air in the middle ear space.  Follow these instructions at home:  Take over-the-counter and prescription medicines only as told by your health care provider.  Use techniques to help pop your ears as recommended by your health care provider. These may include: ? Chewing gum. ? Yawning. ? Frequent, forceful swallowing. ? Closing your mouth, holding your nose closed, and gently blowing as if you are trying to blow air out of your nose.  Do not do any of the following until your health care provider approves: ? Travel to high altitudes. ? Fly in airplanes. ? Work in a pressurized cabin or room. ? Scuba dive.  Keep your ears dry. Dry your ears completely after showering or bathing.  Do not smoke.  Keep all follow-up visits as told by your health care provider. This is important. Contact a health care provider if:  Your symptoms do not go away after treatment.  Your symptoms come back after treatment.  You are   unable to pop your ears.  You have: ? A fever. ? Pain in your ear. ? Pain in your head or neck. ? Fluid draining from your ear.  Your hearing suddenly changes.  You become very dizzy.  You lose your balance. This information is not intended to replace advice given to you by your health care provider. Make sure you discuss any questions you have with your health care provider. Document Released: 11/01/2015 Document Revised: 03/12/2016  Document Reviewed: 10/24/2014 Elsevier Interactive Patient Education  2018 [employer]vier Inc[employer]  

## 2017-12-16 NOTE — Progress Notes (Signed)
Patient: Jackie BrucknerJennifer L Brown Female    DOB: 30-Aug-1981   37 y.o.   MRN: [id number] Visit Date: 12/16/2017  Today's Provider: Margaretann LovelessJennifer M Burnette, PA-C   Chief Complaint  Patient presents with  . Ear Pain   Subjective:    HPI Patient here today C/O left ear pain x's one week. Patient reports hearing loss, and pain around ear, neck and jaw. Patient reports fatigue, and trouble concentrating. Patient was seen on 12/03/17 and was started on Amoxicillin for acute sinusitis by Dr. Sherrie MustacheFisher. [place] was in [place] and [place], not sure if that has something to do with pain.     Allergies  Allergen Reactions  . Ivp Dye [Iodinated Diagnostic Agents] Anaphylaxis  . Iodine   . Phenergan Fortis [Promethazine] Itching    Itching/pins and needles for hours.     Current Outpatient Medications:  .  ALPRAZolam (XANAX) 1 MG tablet, Take 1-2 tablets (1-2 mg total) by mouth every 6 (six) hours as needed., Disp: 12 tablet, Rfl: 1 .  fluticasone (FLONASE) 50 MCG/ACT nasal spray, Place 2 sprays into both nostrils daily., Disp: 16 g, Rfl: 3 .  labetalol (NORMODYNE) 100 MG tablet, Take 1 tablet (100 mg total) by mouth 2 (two) times daily. (Patient not taking: Reported on 12/03/2017), Disp: 1 tablet, Rfl: 0  Review of Systems  Constitutional: Negative.   HENT: Positive for ear pain and sinus pressure. Negative for congestion, ear discharge, hearing loss, postnasal drip, rhinorrhea, sinus pain, sneezing, sore throat, tinnitus and trouble swallowing.   Respiratory: Negative.   Cardiovascular: Negative.   Neurological: Negative.     Social History   Tobacco Use  . Smoking status: Former Games developermoker  . Smokeless tobacco: Never Used  Substance Use Topics  . Alcohol use: No    Alcohol/week: 0.0 oz   Objective:   BP 120/90 (BP Location: Left Arm, Patient Position: Sitting, Cuff Size: Large)   Pulse 90   Temp 98.5 F (36.9 C) (Oral)   Resp 16   Wt (!) 309 lb (140.2 kg)   SpO2 98%    BMI 40.77 kg/m  Vitals:   12/16/17 1042  BP: 120/90  Pulse: 90  Resp: 16  Temp: 98.5 F (36.9 C)  TempSrc: Oral  SpO2: 98%  Weight: (!) 309 lb (140.2 kg)     Physical Exam  Constitutional: She appears well-developed and well-nourished. No distress.  HENT:  Head: Normocephalic and atraumatic.  Right Ear: Hearing, tympanic membrane, external ear and ear canal normal.  Left Ear: Hearing, tympanic membrane, external ear and ear canal normal.  Nose: Nose normal.  Mouth/Throat: Uvula is midline, oropharynx is clear and moist and mucous membranes are normal. No oropharyngeal exudate.  Eyes: Conjunctivae are normal. Pupils are equal, round, and reactive to light. Right eye exhibits no discharge. Left eye exhibits no discharge. No scleral icterus.  Neck: Normal range of motion. Neck supple. No tracheal deviation present. No thyromegaly present.  Cardiovascular: Normal rate, regular rhythm and normal heart sounds. Exam reveals no gallop and no friction rub.  No murmur heard. Pulmonary/Chest: Effort normal and breath sounds normal. No stridor. No respiratory distress. She has no wheezes. She has no rales.  Lymphadenopathy:    She has no cervical adenopathy.  Skin: Skin is warm and dry. She is not diaphoretic.  Vitals reviewed.       Assessment & Plan:     1. Dysfunction of left eustachian tube Ear exam completely  normal. Suspect inflammation causing ETD of the left ear. Will try prednisone as below. Saline nasal irrigations, flonase may be continued. Equalizing exercises shown to patient to perform few times daily. Call if symptoms do not improve.  - predniSONE (STERAPRED UNI-PAK 21 TAB) 10 MG (21) TBPK tablet; 6 day taper; take as directed on package instructions  Dispense: 21 tablet; Refill: 0       Margaretann Loveless, PA-C  [facility]

## 2017-12-16 NOTE — Progress Notes (Unsigned)
       Patient: Jackie BrucknerJennifer L Brown Female    DOB: Sep 18, 1981   36 y.o.   MRN: [id number] Visit Date: 12/16/2017  Today's Provider: Mila Merryonald Fisher, MD   No chief complaint on file.  Subjective:    Otalgia   Pertinent negatives include no abdominal pain or vomiting.       Allergies  Allergen Reactions  . Ivp Dye [Iodinated Diagnostic Agents] Anaphylaxis  . Iodine   . Phenergan Fortis [Promethazine] Itching    Itching/pins and needles for hours.     Current Outpatient Medications:  .  ALPRAZolam (XANAX) 1 MG tablet, Take 1-2 tablets (1-2 mg total) by mouth every 6 (six) hours as needed., Disp: 12 tablet, Rfl: 1 .  fluticasone (FLONASE) 50 MCG/ACT nasal spray, Place 2 sprays into both nostrils daily., Disp: 16 g, Rfl: 3 .  labetalol (NORMODYNE) 100 MG tablet, Take 1 tablet (100 mg total) by mouth 2 (two) times daily. (Patient not taking: Reported on 12/03/2017), Disp: 1 tablet, Rfl: 0  Review of Systems  Constitutional: Negative for appetite change, chills, fatigue and fever.  HENT: Positive for ear pain.   Respiratory: Negative for chest tightness and shortness of breath.   Cardiovascular: Negative for chest pain and palpitations.  Gastrointestinal: Negative for abdominal pain, nausea and vomiting.  Neurological: Negative for dizziness and weakness.    Social History   Tobacco Use  . Smoking status: Former Games developermoker  . Smokeless tobacco: Never Used  Substance Use Topics  . Alcohol use: No    Alcohol/week: 0.0 oz   Objective:   There were no vitals taken for this visit. There were no vitals filed for this visit.   Physical Exam      Assessment & Plan:           Mila Merryonald Fisher, MD  [facility]

## 2017-12-24 ENCOUNTER — Ambulatory Visit (INDEPENDENT_AMBULATORY_CARE_PROVIDER_SITE_OTHER): Payer: Medicaid Other | Admitting: Family Medicine

## 2017-12-24 ENCOUNTER — Encounter: Payer: Self-pay | Admitting: Family Medicine

## 2017-12-24 ENCOUNTER — Telehealth: Payer: Self-pay

## 2017-12-24 VITALS — BP 128/82 | HR 100 | Temp 98.3°F | Resp 16

## 2017-12-24 DIAGNOSIS — H6502 Acute serous otitis media, left ear: Secondary | ICD-10-CM

## 2017-12-24 MED ORDER — FLUCONAZOLE 150 MG PO TABS: 150.0000 mg | ORAL_TABLET | Freq: Once | ORAL | 0 refills | Status: AC

## 2017-12-24 MED ORDER — CEFDINIR 300 MG PO CAPS: 600.0000 mg | ORAL_CAPSULE | Freq: Two times a day (BID) | ORAL | 0 refills | Status: AC

## 2017-12-24 MED ORDER — NEOMYCIN-POLYMYXIN-HC 3.5-10000-1 OT SOLN: 3.0000 [drp] | Freq: Four times a day (QID) | OTIC | 0 refills | Status: AC

## 2017-12-24 NOTE — Progress Notes (Addendum)
       Patient: [name] Female    DOB: 26-Mar-1981   36 y.o.   MRN: [id number] Visit Date: 12/24/2017  Today's Provider: Mila Merryonald Fisher, MD   Chief Complaint  Patient presents with  . Ear Pain    follow up    Subjective:    HPI Patient presents today c/o left ear pain that has not improved since she was seen in the office 2 weeks ago (by Daiva NakayamaJenni Burnette) and she was prescribed a 6 day prednisone taper. She reports that she still has a "clogged" sensation in her left ear. She has decreased hearing, and feels off balance at times. She reports that her symptoms started when she went [place] at the [place] about 3 weeks ago.  She states it may have improved for a day or so on steroids.     Allergies  Allergen Reactions  . Ivp Dye [Iodinated Diagnostic Agents] Anaphylaxis  . Iodine   . Phenergan Fortis [Promethazine] Itching    Itching/pins and needles for hours.     Current Outpatient Medications:  .  fluticasone (FLONASE) 50 MCG/ACT nasal spray, Place 2 sprays into both nostrils daily. (Patient not taking: Reported on 12/24/2017), Disp: 16 g, Rfl: 3  Review of Systems  Constitutional: Negative.   HENT: Positive for ear pain. Negative for ear discharge, postnasal drip and tinnitus.   Respiratory: Negative for cough.   Neurological: Positive for dizziness. Negative for light-headedness and headaches.    Social History   Tobacco Use  . Smoking status: Former Games developermoker  . Smokeless tobacco: Never Used  Substance Use Topics  . Alcohol use: No    Alcohol/week: 0.0 oz   Objective:   BP 128/82 (BP Location: Left Arm, Patient Position: Sitting, Cuff Size: Large)   Pulse 100   Temp 98.3 F (36.8 C)   Resp 16   LMP 03/17/2017   SpO2 98%  Vitals:   12/24/17 1034  BP: 128/82  Pulse: 100  Resp: 16  Temp: 98.3 F (36.8 C)  SpO2: 98%     Physical Exam  General Appearance:    Alert, cooperative, no distress  HENT:   right TM normal without fluid or  infection, left TM fluid noted, neck without nodes, sinuses nontender and nasal mucosa congested  Eyes:    PERRL, conjunctiva/corneas clear, EOM's intact       Lungs:     Clear to auscultation bilaterally, respirations unlabored  Heart:    Regular rate and rhythm  Neurologic:   Awake, alert, oriented x 3. No apparent focal neurological           defect.           Assessment & Plan:     1. Acute serous otitis media of left ear, recurrence not specified  - cefdinir (OMNICEF) 300 MG capsule; Take 2 capsules (600 mg total) by mouth 2 (two) times daily for 10 days.  Dispense: 40 capsule; Refill: 0  She does have small amount of cerumen with some inflammation of surrounding canal.  - neomycin-polymyxin-hydrocortisone (CORTISPORIN) OTIC solution; Place 3 drops into the left ear 4 (four) times daily for 7 days.  Dispense: 10 mL; Refill: 0  Consider ENT referral if not greatly improved when finished with antibiotic.    CORRECTION CALLED INTO PHARMACY: CEFDINIR 300MG  2 CAPSULES DAILY FOR 10 DAYS #20      Mila Merryonald Fisher, MD  [facility]

## 2017-12-24 NOTE — Telephone Encounter (Signed)
Advised pharmacy that it was supposed to say once daily NOT twice daily per Dr. Sherrie MustacheFisher.

## 2017-12-24 NOTE — Telephone Encounter (Signed)
[facility] called to verify Cefdinir dose. Is 1200 mg BID accurate? Please let pharmacy know.

## 2017-12-24 NOTE — Telephone Encounter (Signed)
Verbal was given to the pharmacist.

## 2017-12-29 ENCOUNTER — Telehealth: Payer: Self-pay | Admitting: Family Medicine

## 2017-12-29 DIAGNOSIS — G8929 Other chronic pain: Secondary | ICD-10-CM

## 2017-12-29 DIAGNOSIS — H9209 Otalgia, unspecified ear: Principal | ICD-10-CM

## 2017-12-29 NOTE — Telephone Encounter (Signed)
Please advise 

## 2017-12-29 NOTE — Telephone Encounter (Signed)
Have sent referral order to Sarah. Please advise patient she should here from her in a day or two.

## 2017-12-29 NOTE — Telephone Encounter (Signed)
Pt stated that Dr. Sherrie MustacheFisher advised her at her OV on 12/24/17 that if her ear continued to hurt she would need to be referred to ENT. Pt stated that her ear is still bothering her and is requesting to move forward with ENT referral. Please advise. Thanks TNP

## 2017-12-30 NOTE — Telephone Encounter (Signed)
Tried calling patient, no answer. VMbox full and unable to leave a message.

## 2018-02-21 ENCOUNTER — Ambulatory Visit: Payer: Medicaid Other | Admitting: Physician Assistant

## 2018-02-21 ENCOUNTER — Other Ambulatory Visit: Payer: Self-pay

## 2018-02-21 ENCOUNTER — Ambulatory Visit: Payer: Medicaid Other | Admitting: Family Medicine

## 2018-02-21 ENCOUNTER — Encounter: Payer: Self-pay | Admitting: Physician Assistant

## 2018-02-21 VITALS — BP 168/100 | HR 98 | Temp 99.0°F | Wt 308.0 lb

## 2018-02-21 DIAGNOSIS — R300 Dysuria: Secondary | ICD-10-CM

## 2018-02-21 DIAGNOSIS — N912 Amenorrhea, unspecified: Secondary | ICD-10-CM | POA: Diagnosis not present

## 2018-02-21 DIAGNOSIS — Z3A15 15 weeks gestation of pregnancy: Secondary | ICD-10-CM

## 2018-02-21 LAB — POCT URINALYSIS DIPSTICK
Bilirubin, UA: NEGATIVE
Blood, UA: NEGATIVE
Glucose, UA: NEGATIVE
Ketones, UA: NEGATIVE
Leukocytes, UA: NEGATIVE
Nitrite, UA: NEGATIVE
Protein, UA: NEGATIVE
Spec Grav, UA: 1.015 (ref 1.010–1.025)
Urobilinogen, UA: 0.2 E.U./dL
pH, UA: 7 (ref 5.0–8.0)

## 2018-02-21 LAB — POCT URINE PREGNANCY: Preg Test, Ur: POSITIVE — AB

## 2018-02-21 NOTE — Progress Notes (Signed)
Patient: [name] Female    DOB: 08/20/1981   36 y.o.   MRN: [id number] Visit Date: 02/21/2018  Today's Provider: Margaretann Loveless, PA-C   Chief Complaint  Patient presents with  . Dysuria   Subjective:    Dysuria   This is a new problem. The current episode started in the past 7 days. The problem has been gradually worsening. The quality of the pain is described as aching and burning. She is sexually active. Associated symptoms include chills, flank pain, hesitancy and a possible pregnancy. She has tried increased fluids for the symptoms. The treatment provided mild relief.   Patient reports she is approx [redacted] weeks pregnant. She has done an at home test and was positive. She is only guessing 15 weeks due to her last menstrual cycle. She has not had an appt with an OB. She is trying to get in at [facility].     Allergies  Allergen Reactions  . Ivp Dye [Iodinated Diagnostic Agents] Anaphylaxis  . Iodine   . Phenergan Fortis [Promethazine] Itching    Itching/pins and needles for hours.     Current Outpatient Medications:  .  NON FORMULARY, Mixture of herbs, Disp: , Rfl:  .  Nutritional Supplements (JUICE PLUS FIBRE PO), Take by mouth., Disp: , Rfl:  .  Prenatal Vit-Fe Fumarate-FA (PRENATAL MULTIVITAMIN) TABS tablet, Take 1 tablet by mouth daily at 12 noon., Disp: , Rfl:   Review of Systems  Constitutional: Positive for chills.  Genitourinary: Positive for dysuria, flank pain and hesitancy.    Social History   Tobacco Use  . Smoking status: Former Games developer  . Smokeless tobacco: Never Used  Substance Use Topics  . Alcohol use: No    Alcohol/week: 0.0 oz   Objective:   BP (!) 168/100 (BP Location: Left Arm, Patient Position: Sitting, Cuff Size: Large)   Pulse 98   Temp 99 F (37.2 C) (Oral)   Wt (!) 308 lb (139.7 kg)   LMP 11/11/2016   SpO2 99%   BMI 40.64 kg/m  Vitals:   02/21/18 1343  BP: (!) 168/100  Pulse: 98  Temp: 99 F (37.2 C)  TempSrc:  Oral  SpO2: 99%  Weight: (!) 308 lb (139.7 kg)     Physical Exam  Constitutional: She is oriented to person, place, and time. She appears well-developed and well-nourished. No distress.  Cardiovascular: Normal rate, regular rhythm and normal heart sounds. Exam reveals no gallop and no friction rub.  No murmur heard. Pulmonary/Chest: Effort normal and breath sounds normal. No respiratory distress. She has no wheezes. She has no rales.  Abdominal: Soft. Normal appearance and bowel sounds are normal. She exhibits no distension and no mass. There is no hepatosplenomegaly. There is no tenderness. There is no rebound, no guarding and no CVA tenderness.  Neurological: She is alert and oriented to person, place, and time.  Skin: Skin is warm and dry. She is not diaphoretic.       Assessment & Plan:     1. Dysuria UA normal. Will send culture and f/u pending results.  - POCT urinalysis dipstick - Urine Culture  2. [redacted] weeks gestation of pregnancy Possibly, unsure of true gestational age. [place] ordered at patient request to make sure all is well and obtain possible gestational age due to patient inability to get in with [facility] in a timely manner (she is about 6 weeks out from possible appointment at this time per patient).  -  US FETAL BPP W/NONSTRESS; Future  3. Amenorrhea Positive - POCT urine pregnancy       Margaretann Loveless, PA-C  [facility]

## 2018-02-22 ENCOUNTER — Other Ambulatory Visit: Payer: Self-pay | Admitting: Family Medicine

## 2018-02-22 ENCOUNTER — Telehealth: Payer: Self-pay | Admitting: Family Medicine

## 2018-02-22 NOTE — Telephone Encounter (Signed)
Per ultrasound department if pt is 15 weeks or more the correct test would be US OB COMP PLUS 14 WEEKS.[id number]

## 2018-02-22 NOTE — Addendum Note (Signed)
Addended by: Margaretann Loveless on: 02/22/2018 10:48 AM   Modules accepted: Orders

## 2018-02-22 NOTE — Telephone Encounter (Signed)
fixed

## 2018-02-23 LAB — URINE CULTURE

## 2018-02-25 ENCOUNTER — Other Ambulatory Visit: Payer: Self-pay | Admitting: Physician Assistant

## 2018-02-25 NOTE — Addendum Note (Signed)
Addended by: Margaretann Loveless on: 02/25/2018 04:53 PM   Modules accepted: Orders

## 2018-02-25 NOTE — Progress Notes (Signed)
error 

## 2018-03-10 ENCOUNTER — Telehealth: Payer: Self-pay | Admitting: Emergency Medicine

## 2018-03-10 DIAGNOSIS — Z3A16 16 weeks gestation of pregnancy: Secondary | ICD-10-CM

## 2018-03-10 NOTE — Telephone Encounter (Signed)
Corrie Dandy from [facility] clinic called requesting a referral for pt to be seen and records sent over. She says we did not refer her but she is on the schedule and will need these for her to be seen. Please advise. Thanks.   CB#[phone]  Fax [phone]

## 2018-03-10 NOTE — Telephone Encounter (Signed)
Referral placed.

## 2018-03-17 NOTE — Telephone Encounter (Signed)
Information faxed

## 2018-03-24 ENCOUNTER — Ambulatory Visit: Admission: RE | Admit: 2018-03-24 | Payer: Self-pay | Source: Ambulatory Visit

## 2018-06-21 ENCOUNTER — Encounter: Payer: Self-pay | Admitting: Family Medicine

## 2018-06-21 ENCOUNTER — Ambulatory Visit: Payer: Medicaid Other | Admitting: Family Medicine

## 2018-06-21 VITALS — BP 134/86 | HR 78 | Temp 98.7°F | Resp 16 | Wt 311.0 lb

## 2018-06-21 DIAGNOSIS — I10 Essential (primary) hypertension: Secondary | ICD-10-CM

## 2018-06-21 MED ORDER — LABETALOL HCL 200 MG PO TABS
200.0000 mg | ORAL_TABLET | Freq: Two times a day (BID) | ORAL | 5 refills | Status: DC
Start: 2018-06-21 — End: 2019-05-01

## 2018-06-21 MED ORDER — HYDROCHLOROTHIAZIDE 25 MG PO TABS: 25.0000 mg | ORAL_TABLET | Freq: Every day | ORAL | 0 refills | Status: DC

## 2018-06-21 NOTE — Patient Instructions (Signed)
Please follow up in 3-4 weeks. Let us know if you can't tolerate the new medication.

## 2018-06-21 NOTE — Progress Notes (Signed)
  Subjective:     Patient ID: [name], female   DOB: 01-11-1981, 37 y.o.   MRN: [id number] Chief Complaint  Patient presents with  . Hypertension    Patient comes in office today for 6 month follow up, last office visit was 10/06/17 and blood pressure in house was 150/100. At last visit we restarted patient back on Labetol 100mg  BID, patient reports two months ago she decided to increase it herself to 400 mg bid. Patient reports that she increased dose because blood pressure readings at home were high and now she states that she is having unsual symptoms since increasing dose.    HPI Reports increased leg swelling and mild anxiety after an spontaneous abortion in May. States her cycles have resumed x 3.  Review of Systems     Objective:   Physical Exam  Constitutional: She appears well-developed and well-nourished. No distress.  Cardiovascular: Normal rate and regular rhythm.  Pulmonary/Chest: Breath sounds normal.  Musculoskeletal: She exhibits edema (trace pedal edema).       Assessment:    1. Hypertension, unspecified type: resume 200 mg. Dose of labetalol and start HCTZ; - hydrochlorothiazide (HYDRODIURIL) 25 MG tablet; Take 1 tablet (25 mg total) by mouth daily.  Dispense: 30 tablet; Refill: 0 - labetalol (NORMODYNE) 200 MG tablet; Take 1 tablet (200 mg total) by mouth every 12 (twelve) hours.  Dispense: 60 tablet; Refill: 5    Plan:    Call if not tolerating the medication. F/u in 3-4 weeks.

## 2018-07-13 ENCOUNTER — Ambulatory Visit: Payer: Medicaid Other | Admitting: Family Medicine

## 2018-07-13 ENCOUNTER — Encounter: Payer: Self-pay | Admitting: Family Medicine

## 2018-07-13 VITALS — BP 139/88 | HR 79 | Temp 98.2°F | Resp 16 | Ht 73.0 in | Wt 309.0 lb

## 2018-07-13 DIAGNOSIS — F410 Panic disorder [episodic paroxysmal anxiety] without agoraphobia: Secondary | ICD-10-CM

## 2018-07-13 DIAGNOSIS — I10 Essential (primary) hypertension: Secondary | ICD-10-CM

## 2018-07-13 MED ORDER — HYDROCHLOROTHIAZIDE 25 MG PO TABS: 25.0000 mg | ORAL_TABLET | Freq: Every day | ORAL | 1 refills | Status: DC

## 2018-07-13 MED ORDER — VORTIOXETINE HBR 20 MG PO TABS: 20.0000 mg | ORAL_TABLET | Freq: Every day | ORAL | 1 refills | Status: DC

## 2018-07-13 NOTE — Patient Instructions (Signed)
   Start taking 2 x 5mg  Trintellix tablets daily for one week (samples)   If tolerating, start prescription for 1 20mg  tablet daily when samples are out.

## 2018-07-13 NOTE — Progress Notes (Signed)
Patient: [name] Female    DOB: [birth year]   37 y.o.   MRN: [id number] Visit Date: 07/13/2018  Today's Provider: [name], MD   Chief Complaint  Patient presents with  . Follow-up  . Hypertension   Subjective:    HPI   Hypertension, follow-up:  BP Readings from Last 3 Encounters:  07/13/18 139/88  06/21/18 134/86  02/21/18 (!) 168/100    She was last seen for hypertension 3 weeks ago.  BP at that visit was 134/86. Management since that visit includes; seen by Toni Arthurs. Added HCTZ 25 mg qd. She had been taking up to 400mg  a dday of labetolol, and  advised to continue Labetalol 200 mg q12hrs.She reports good compliance with treatment.  State she had been taking 2 hctz daily, but ran out 2 days ago, last dose was 50mg  on 07/11/18 She is not having side effects. none She is not exercising. She is adherent to low salt diet.   Outside blood pressures are not checking. She is experiencing chest pressure/discomfort.   Cardiovascular risk factors include none.  Use of agents associated with hypertension: none.    She is also concerned that she has been having a lot more anxiety for several months. Feels depressed at times. Is concerned her anxiety my be contributing to her high blood pressure.  Did well with lexapro and celexa in the past, but had trouble with sexual side effects. She would like to try a different medication.   Allergies  Allergen Reactions  . Ivp Dye [Iodinated Diagnostic Agents] Anaphylaxis  . Iodine   . Phenergan Fortis [Promethazine] Itching    Itching/pins and needles for hours.     Current Outpatient Medications:  .  hydrochlorothiazide (HYDRODIURIL) 25 MG tablet, Take 1 tablet (25 mg total) by mouth daily., Disp: 30 tablet, Rfl: 0 .  labetalol (NORMODYNE) 200 MG tablet, Take 1 tablet (200 mg total) by mouth every 12 (twelve) hours., Disp: 60 tablet, Rfl: 5 .  NON FORMULARY, Mixture of herbs, Disp: , Rfl:  .  Nutritional  Supplements (JUICE PLUS FIBRE PO), Take by mouth., Disp: , Rfl:   Review of Systems  Constitutional: Negative for appetite change, chills, fatigue and fever.  Respiratory: Negative for chest tightness and shortness of breath.   Cardiovascular: Negative for chest pain and palpitations.  Gastrointestinal: Negative for abdominal pain, nausea and vomiting.  Neurological: Negative for dizziness and weakness.    Social History   Tobacco Use  . Smoking status: Former Games developer  . Smokeless tobacco: Never Used  Substance Use Topics  . Alcohol use: No    Alcohol/week: 0.0 standard drinks   Objective:   BP 139/88 (BP Location: Right Arm, Patient Position: Sitting, Cuff Size: Large)   Pulse 79   Temp 98.2 F (36.8 C) (Oral)   Resp 16   Ht 6\' 1"  (1.854 m)   Wt (!) 309 lb (140.2 kg)   SpO2 98%   BMI 40.77 kg/m  Vitals:   07/13/18 1633  BP: 139/88  Pulse: 79  Resp: 16  Temp: 98.2 F (36.8 C)  TempSrc: Oral  SpO2: 98%  Weight: (!) 309 lb (140.2 kg)  Height: 6\' 1"  (1.854 m)     Physical Exam      General Appearance:    Alert, cooperative, no distress, obese  Eyes:    PERRL, conjunctiva/corneas clear, EOM's intact       Lungs:     Clear to auscultation bilaterally,  respirations unlabored  Heart:    Regular rate and rhythm  Neurologic:   Awake, alert, oriented x 3. No apparent focal neurological           defect.        Assessment & Plan:     1. Hypertension, unspecified type Well controlled.  Continue current medications.   - hydrochlorothiazide (HYDRODIURIL) 25 MG tablet; Take 1 tablet (25 mg total) by mouth daily.  Dispense: 30 tablet; Refill: 1  2. Episodic paroxysmal anxiety disorder Had good response to SSRIs in the past, but stopped due to sexual side effect. Will try samples Trinellix.  Patient Instructions   Start taking 2 x 5mg  Trintellix tablets daily for one week (samples)   If tolerating, start prescription for 1 20mg  tablet daily when samples are out.     - vortioxetine HBr (TRINTELLIX) 20 MG TABS tablet; Take 1 tablet (20 mg total) by mouth daily.  Dispense: 30 tablet; Refill: 1  Follow up in about a month.        [name], MD  [facility]

## 2018-07-15 ENCOUNTER — Other Ambulatory Visit: Payer: Self-pay | Admitting: Family Medicine

## 2018-07-15 DIAGNOSIS — I10 Essential (primary) hypertension: Secondary | ICD-10-CM

## 2018-08-12 ENCOUNTER — Ambulatory Visit: Payer: Self-pay | Admitting: Family Medicine

## 2018-08-16 ENCOUNTER — Telehealth: Payer: Self-pay | Admitting: Family Medicine

## 2018-08-16 DIAGNOSIS — G8929 Other chronic pain: Secondary | ICD-10-CM

## 2018-08-16 DIAGNOSIS — H9209 Otalgia, unspecified ear: Principal | ICD-10-CM

## 2018-08-16 NOTE — Telephone Encounter (Signed)
Pt calling to reactivate her previous ENT referral.   Please call pt back to let her know when it is done.  Thanks, [employer]

## 2018-08-30 ENCOUNTER — Other Ambulatory Visit (HOSPITAL_COMMUNITY)
Admission: RE | Admit: 2018-08-30 | Discharge: 2018-08-30 | Disposition: A | Discharge status: Home / Self Care | Payer: Medicaid Other | Source: Ambulatory Visit | Attending: Family Medicine | Admitting: Family Medicine

## 2018-08-30 ENCOUNTER — Ambulatory Visit (INDEPENDENT_AMBULATORY_CARE_PROVIDER_SITE_OTHER): Payer: Medicaid Other | Admitting: Family Medicine

## 2018-08-30 ENCOUNTER — Encounter: Payer: Self-pay | Admitting: Family Medicine

## 2018-08-30 VITALS — BP 160/96 | HR 101 | Ht 73.0 in | Wt 311.0 lb

## 2018-08-30 DIAGNOSIS — N920 Excessive and frequent menstruation with regular cycle: Secondary | ICD-10-CM | POA: Diagnosis not present

## 2018-08-30 DIAGNOSIS — F410 Panic disorder [episodic paroxysmal anxiety] without agoraphobia: Secondary | ICD-10-CM

## 2018-08-30 DIAGNOSIS — Z124 Encounter for screening for malignant neoplasm of cervix: Secondary | ICD-10-CM

## 2018-08-30 DIAGNOSIS — N898 Other specified noninflammatory disorders of vagina: Secondary | ICD-10-CM

## 2018-08-30 DIAGNOSIS — Z0000 Encounter for general adult medical examination without abnormal findings: Secondary | ICD-10-CM | POA: Diagnosis not present

## 2018-08-30 DIAGNOSIS — I10 Essential (primary) hypertension: Secondary | ICD-10-CM

## 2018-08-30 DIAGNOSIS — Z01419 Encounter for gynecological examination (general) (routine) without abnormal findings: Secondary | ICD-10-CM

## 2018-08-30 MED ORDER — BUPROPION HCL 75 MG PO TABS: 75.0000 mg | ORAL_TABLET | Freq: Two times a day (BID) | ORAL | 1 refills | Status: DC

## 2018-08-30 NOTE — Progress Notes (Signed)
Had miscarriage in May since then pt has had increased anxiety and blood pressure had increased. Has been given meds but would like to discuss further.  Pt also has some vaginal irritation that comes and goes.

## 2018-08-30 NOTE — Patient Instructions (Signed)
Preventive Care 18-39 Years, Female Preventive care refers to lifestyle choices and visits with your health care provider that can promote health and wellness. What does preventive care include?  A yearly physical exam. This is also called an annual well check.  Dental exams once or twice a year.  Routine eye exams. Ask your health care provider how often you should have your eyes checked.  Personal lifestyle choices, including: ? Daily care of your teeth and gums. ? Regular physical activity. ? Eating a healthy diet. ? Avoiding tobacco and drug use. ? Limiting alcohol use. ? Practicing safe sex. ? Taking vitamin and mineral supplements as recommended by your health care provider. What happens during an annual well check? The services and screenings done by your health care provider during your annual well check will depend on your age, overall health, lifestyle risk factors, and family history of disease. Counseling Your health care provider may ask you questions about your:  Alcohol use.  Tobacco use.  Drug use.  Emotional well-being.  Home and relationship well-being.  Sexual activity.  Eating habits.  Work and work Statistician.  Method of birth control.  Menstrual cycle.  Pregnancy history.  Screening You may have the following tests or measurements:  Height, weight, and BMI.  Diabetes screening. This is done by checking your blood sugar (glucose) after you have not eaten for a while (fasting).  Blood pressure.  Lipid and cholesterol levels. These may be checked every 5 years starting at age 38.  Skin check.  Hepatitis C blood test.  Hepatitis B blood test.  Sexually transmitted disease (STD) testing.  BRCA-related cancer screening. This may be done if you have a family history of breast, ovarian, tubal, or peritoneal cancers.  Pelvic exam and Pap test. This may be done every 3 years starting at age 38. Starting at age 30, this may be done  every 5 years if you have a Pap test in combination with an HPV test.  Discuss your test results, treatment options, and if necessary, the need for more tests with your health care provider. Vaccines Your health care provider may recommend certain vaccines, such as:  Influenza vaccine. This is recommended every year.  Tetanus, diphtheria, and acellular pertussis (Tdap, Td) vaccine. You may need a Td booster every 10 years.  Varicella vaccine. You may need this if you have not been vaccinated.  HPV vaccine. If you are 39 or younger, you may need three doses over 6 months.  Measles, mumps, and rubella (MMR) vaccine. You may need at least one dose of MMR. You may also need a second dose.  Pneumococcal 13-valent conjugate (PCV13) vaccine. You may need this if you have certain conditions and were not previously vaccinated.  Pneumococcal polysaccharide (PPSV23) vaccine. You may need one or two doses if you smoke cigarettes or if you have certain conditions.  Meningococcal vaccine. One dose is recommended if you are age 68-21 years and a first-year college student living in a residence hall, or if you have one of several medical conditions. You may also need additional booster doses.  Hepatitis A vaccine. You may need this if you have certain conditions or if you travel or work in places where you may be exposed to hepatitis A.  Hepatitis B vaccine. You may need this if you have certain conditions or if you travel or work in places where you may be exposed to hepatitis B.  Haemophilus influenzae type b (Hib) vaccine. You may need this  if you have certain risk factors.  Talk to your health care provider about which screenings and vaccines you need and how often you need them. This information is not intended to replace advice given to you by your health care provider. Make sure you discuss any questions you have with your health care provider. Document Released: 12/01/2001 Document Revised:  06/24/2016 Document Reviewed: 08/06/2015 Elsevier Interactive Patient Education  2018 Elsevier Inc.  

## 2018-08-30 NOTE — Assessment & Plan Note (Signed)
Has sexual side effects from SSRIs. She cannot afford Trintellex. Will try Wellbutrin.

## 2018-08-30 NOTE — Assessment & Plan Note (Signed)
Not taking meds, related to anxiety

## 2018-08-30 NOTE — Assessment & Plan Note (Signed)
Since last loss, will check hormone levels

## 2018-08-30 NOTE — Progress Notes (Signed)
  Subjective:     [name] is a 37 y.o. female and is here for a comprehensive physical exam. The patient reports problems - has vaginal irritation and anxiety. Worse since giving birth with 16 wk loss.   The following portions of the patient's history were reviewed and updated as appropriate: allergies, current medications, past family history, past medical history, past social history, past surgical history and problem list.  Review of Systems Pertinent items noted in HPI and remainder of comprehensive ROS otherwise negative.   Objective:    BP (!) 160/96   Pulse (!) 101   Ht 6\' 1"  (1.854 m)   Wt (!) 311 lb (141.1 kg)   LMP 08/04/2018 (Exact Date)   Breastfeeding? Unknown   BMI 41.03 kg/m  General appearance: alert, cooperative and appears stated age Head: Normocephalic, without obvious abnormality, atraumatic Neck: no adenopathy, supple, symmetrical, trachea midline and thyroid not enlarged, symmetric, no tenderness/mass/nodules Lungs: clear to auscultation bilaterally Breasts: normal appearance, no masses or tenderness Heart: regular rate and rhythm Abdomen: soft, non-tender; bowel sounds normal; no masses,  no organomegaly Pelvic: cervix normal in appearance, external genitalia normal, no adnexal masses or tenderness, no cervical motion tenderness, uterus normal size, shape, and consistency and vagina normal without discharge Extremities: Homans sign is negative, no sign of DVT Pulses: 2+ and symmetric Skin: Skin color, texture, turgor normal. No rashes or lesions Lymph nodes: Cervical, supraclavicular, and axillary nodes normal. Neurologic: Grossly normal    Assessment:    GYN female exam.      Plan:      Problem List Items Addressed This Visit      Medium   Paroxysmal hypertension    Not taking meds, related to anxiety        Unprioritized   Episodic paroxysmal anxiety disorder    Has sexual side effects from SSRIs. She cannot afford Trintellex. Will  try Wellbutrin.      Relevant Medications   buPROPion (WELLBUTRIN) 75 MG tablet   Excess, menstruation    Since last loss, will check hormone levels      Relevant Orders   TSH   Follicle stimulating hormone    Other Visit Diagnoses    Vaginal discharge    -  Primary   Check wet prep   Relevant Orders   Cervicovaginal ancillary only   Encounter for gynecological examination without abnormal finding       Screening for cervical cancer       Relevant Orders   Cytology - PAP     Return in 1 year (on 08/31/2019).  See After Visit Summary for Counseling Recommendations

## 2018-08-31 ENCOUNTER — Other Ambulatory Visit: Payer: Self-pay | Admitting: Physician Assistant

## 2018-08-31 DIAGNOSIS — H9319 Tinnitus, unspecified ear: Secondary | ICD-10-CM | POA: Diagnosis not present

## 2018-08-31 DIAGNOSIS — H9072 Mixed conductive and sensorineural hearing loss, unilateral, left ear, with unrestricted hearing on the contralateral side: Secondary | ICD-10-CM

## 2018-08-31 DIAGNOSIS — H908 Mixed conductive and sensorineural hearing loss, unspecified: Secondary | ICD-10-CM | POA: Diagnosis not present

## 2018-08-31 LAB — FOLLICLE STIMULATING HORMONE: FSH: 3.6 m[IU]/mL

## 2018-08-31 LAB — TSH: TSH: 1.11 u[IU]/mL (ref 0.450–4.500)

## 2018-09-01 LAB — CYTOLOGY - PAP
Diagnosis: NEGATIVE
HPV: NOT DETECTED

## 2018-09-01 LAB — CERVICOVAGINAL ANCILLARY ONLY
Bacterial vaginitis: NEGATIVE
Candida vaginitis: NEGATIVE
Trichomonas: NEGATIVE

## 2018-09-06 ENCOUNTER — Ambulatory Visit
Admission: RE | Admit: 2018-09-06 | Discharge: 2018-09-06 | Disposition: A | Discharge status: Home / Self Care | Payer: Medicaid Other | Source: Ambulatory Visit | Attending: Physician Assistant | Admitting: Physician Assistant

## 2018-09-06 DIAGNOSIS — H9192 Unspecified hearing loss, left ear: Secondary | ICD-10-CM | POA: Diagnosis not present

## 2018-09-06 DIAGNOSIS — H9072 Mixed conductive and sensorineural hearing loss, unilateral, left ear, with unrestricted hearing on the contralateral side: Secondary | ICD-10-CM | POA: Diagnosis not present

## 2018-09-13 DIAGNOSIS — H6980 Other specified disorders of Eustachian tube, unspecified ear: Secondary | ICD-10-CM | POA: Diagnosis not present

## 2018-09-13 DIAGNOSIS — H9072 Mixed conductive and sensorineural hearing loss, unilateral, left ear, with unrestricted hearing on the contralateral side: Secondary | ICD-10-CM | POA: Diagnosis not present

## 2018-09-14 ENCOUNTER — Telehealth: Payer: Self-pay | Admitting: Family Medicine

## 2018-09-14 NOTE — Telephone Encounter (Signed)
Beth with [facility] is requesting Dr. Sherrie MustacheFisher refer pt to [facility] for further evaluation. Beth stated that we referred pt to their office to see Dr. Willeen CassBennett and Dr. Willeen CassBennett is requesting pt see Dr. Melene PlanBrendon O'Connell with [facility] and because pt has Medicaid they require referral from PCP. Beth stated that Dr. Willeen CassBennett will be sending his office visit notes to Dr. Sherrie MustacheFisher. Advised Dr. Sherrie MustacheFisher is out of the office until 09/19/18. Please advise. Thanks TNP

## 2018-09-14 NOTE — Telephone Encounter (Signed)
Please advise 

## 2018-09-19 NOTE — Telephone Encounter (Signed)
I haven't gotten any records from Dr. Talmage NapBennett's office  We need his records in order to make referral.

## 2018-09-20 NOTE — Telephone Encounter (Signed)
Left message for Beth with [facility] to call back.   Thanks,   -Vernona RiegerLaura

## 2018-09-21 NOTE — Telephone Encounter (Signed)
I received audiometry report, but no notes from Dr. Willeen CassBennett documenting reason for referral. We need Dr. Vincente PoliBennetts office notes to do referral.

## 2018-09-22 NOTE — Telephone Encounter (Signed)
Left message advising Beth with [facility] that Dr. Sherrie MustacheFisher needs office notes from Dr. Willeen CassBennett.   Thanks,   -Vernona RiegerLaura

## 2018-09-27 ENCOUNTER — Ambulatory Visit (INDEPENDENT_AMBULATORY_CARE_PROVIDER_SITE_OTHER): Payer: Medicaid Other | Admitting: Family Medicine

## 2018-09-27 ENCOUNTER — Encounter: Payer: Self-pay | Admitting: Family Medicine

## 2018-09-27 VITALS — BP 152/96 | HR 86 | Wt 308.0 lb

## 2018-09-27 DIAGNOSIS — F321 Major depressive disorder, single episode, moderate: Secondary | ICD-10-CM

## 2018-09-27 DIAGNOSIS — I10 Essential (primary) hypertension: Secondary | ICD-10-CM

## 2018-09-27 DIAGNOSIS — F410 Panic disorder [episodic paroxysmal anxiety] without agoraphobia: Secondary | ICD-10-CM

## 2018-09-27 DIAGNOSIS — F329 Major depressive disorder, single episode, unspecified: Secondary | ICD-10-CM | POA: Insufficient documentation

## 2018-09-27 MED ORDER — BUPROPION HCL 100 MG PO TABS: 100.0000 mg | ORAL_TABLET | Freq: Two times a day (BID) | ORAL | 2 refills | Status: DC

## 2018-09-27 MED ORDER — BUSPIRONE HCL 7.5 MG PO TABS: 7.5000 mg | ORAL_TABLET | Freq: Two times a day (BID) | ORAL | 2 refills | Status: DC

## 2018-09-27 NOTE — Patient Instructions (Signed)
Agoraphobia Agoraphobia is a mental health disorder. It is a type of anxiety or fear. People with agoraphobia fear public places where they may be trapped, helpless, or embarrassed in the event of a panic attack or a loss of control. They often start to avoid the feared situations or insist that another person go with them. Agoraphobia may interfere with normal daily activities and personal relationships. People with severe agoraphobia may become completely homebound and dependent on others for grocery shopping and other errands. Agoraphobia usually begins before age 35, but it can start in the older adult years. People with agoraphobia are at risk for other anxiety disorders, depression, and substance abuse. What are the causes? It is not known exactly what causes agoraphobia. What increases the risk? Agoraphobia is more common in women. People who have panic disorder or have family members with agoraphobia are at higher risk of developing agoraphobia. What are the signs or symptoms? You may have agoraphobia if you have the following symptoms for 6 months or longer:  Intense fear about two or more of the following: ? Using public transportation, such as cars, buses, planes, trains, or ships. ? Being in open spaces, such as parking lots, shopping malls, or bridges. ? Being in enclosed spaces, such as shops, theaters, or elevators. ? Standing in line or being in a crowd. ? Being outside the home alone.  Fear that is due to thoughts of being unable to escape or get help if certain events occur. The feared event may be a panic attack or panic-like symptoms, such as a racing heart, dizziness, and trouble breathing. In older people, the feared event may be a fall or loss of bowel control.  Reacting to feared situations by: ? Avoiding them. ? Requiring the presence of a companion. ? Enduring them with intense fear or anxiety.  Fear or anxiety that is out of proportion to the actual danger that is  posed by the event and the situation.  How is this diagnosed? Agoraphobia may be diagnosed by your health care provider. You will be asked questions about your fears and how they have affected you. You may be asked about your medical history and your use of medicines, alcohol, or drugs. Your health care provider may do a physical exam and order lab tests or other studies to rule out a medical condition. You may also be referred to a mental health specialist. How is this treated? Treatment usually includes a combination of counseling and medicines.  Counseling or talk therapy. Talk therapy is provided by mental health specialists. The following forms of talk therapy can be especially helpful: ? Cognitive therapy. Cognitive therapy helps you to recognize and change unrealistic thoughts and beliefs that contribute to your fears. ? Exposure therapy. Exposure therapy helps you to face and overcome your fears in a relaxed state and a safe environment.  Medicines. The following types of medicines may be helpful: ? Antidepressants. Antidepressants are believed to affect certain chemicals in your brain. They can decrease general levels of anxiety and can help to prevent panic attacks. ? Benzodiazepines. These medicines block feelings of anxiety and panic. They are very effective and act more quickly than antidepressants, but they are highly addictive. These medicines are recommended only for short-term use. ? Beta blockers. Beta blockers can reduce physical symptoms of anxiety, such as a racing heart, sweating, and tremor. They may help you to feel less tense and anxious.  Follow these instructions at home:  Keep all follow-up visits as   directed by your health care provider. This is important.  Take all medicines only as directed by your health care provider.  Try to exercise, eat a healthy diet, and get plenty of sleep.  Do not drink alcohol.  Do not use illegal drugs. Where to find more  information: For more information, visit the website of the Anxiety and Depression Association of America (ADAA): www.adaa.org Contact a health care provider if:  Your fear or anxiety gets worse.  You have new fears or anxieties. Get help right away if:  You have serious thoughts about hurting yourself or someone else.  You have trouble breathing or have chest pain. This information is not intended to replace advice given to you by your health care provider. Make sure you discuss any questions you have with your health care provider. Document Released: 02/25/2011 Document Revised: 03/12/2016 Document Reviewed: 02/05/2014 Elsevier Interactive Patient Education  2018 Elsevier Inc.  

## 2018-09-27 NOTE — Progress Notes (Signed)
Pt states she is feeling better, but not where she would like to be yet.   Scheryl Martenhristine Treshun Wold, RN

## 2018-09-27 NOTE — Assessment & Plan Note (Signed)
Add Buspar for anxiety

## 2018-09-27 NOTE — Progress Notes (Signed)
   Subjective:    Patient ID: Jackie BrucknerJennifer L Bealer is a 37 y.o. female presenting with Follow-up  on 09/27/2018  HPI: Started on Wellbutrin last visit. Has noted that she has more motivation. Notes that her depression is better and is not completely gone. Would like something extra for anxiety. Still having racing thoughts. Has some issues with her first son, who has been diagnosed with bipolar 1.  Review of Systems  Constitutional: Negative for chills and fever.  Respiratory: Negative for shortness of breath.   Cardiovascular: Negative for chest pain.  Gastrointestinal: Negative for abdominal pain, nausea and vomiting.  Genitourinary: Negative for dysuria.  Skin: Negative for rash.  Psychiatric/Behavioral: Positive for dysphoric mood. Negative for self-injury.      Objective:    BP (!) 152/96 (BP Location: Right Arm)   Pulse 86   Wt (!) 308 lb (139.7 kg)   BMI 40.64 kg/m  Physical Exam  Constitutional: She is oriented to person, place, and time. She appears well-developed and well-nourished. No distress.  HENT:  Head: Normocephalic and atraumatic.  Eyes: No scleral icterus.  Neck: Neck supple.  Cardiovascular: Normal rate.  Pulmonary/Chest: Effort normal.  Abdominal: Soft.  Neurological: She is alert and oriented to person, place, and time.  Skin: Skin is warm and dry.  Psychiatric: Her mood appears anxious. She exhibits a depressed mood.        Assessment & Plan:   Problem List Items Addressed This Visit      Medium   Paroxysmal hypertension     Unprioritized   Episodic paroxysmal anxiety disorder - Primary    Add Buspar for anxiety      Relevant Medications   busPIRone (BUSPAR) 7.5 MG tablet   buPROPion (WELLBUTRIN) 100 MG tablet   Depression, major, single episode    Continue Wellbutrin--will increase to 100 mg bid.      Relevant Medications   busPIRone (BUSPAR) 7.5 MG tablet   buPROPion (WELLBUTRIN) 100 MG tablet      Total face-to-face time with  patient: 15 minutes. Over 50% of encounter was spent on counseling and coordination of care. Return in about 4 weeks (around 10/25/2018).  Reva Boresanya S Pratt 09/27/2018 9:23 AM

## 2018-09-27 NOTE — Assessment & Plan Note (Signed)
Continue Wellbutrin--will increase to 100 mg bid.

## 2018-09-29 ENCOUNTER — Telehealth: Payer: Self-pay | Admitting: Family Medicine

## 2018-09-29 NOTE — Telephone Encounter (Signed)
Dr. Sherrie MustacheFisher, did you receive the notes from Dr. Talmage NapBennett's office?

## 2018-09-29 NOTE — Telephone Encounter (Signed)
Left another message for Beth at [facility] to call our office back.

## 2018-09-29 NOTE — Telephone Encounter (Signed)
I spoke with [name] in Medical records. She states our system has been down, and now she has several electronic faxes to review. She will let me know if the fax from Dr. Geoffry ParadiseBennet's office is in there.

## 2018-09-29 NOTE — Telephone Encounter (Signed)
I saw a audiometry report, but i've not received any office notes from ENT

## 2018-09-29 NOTE — Telephone Encounter (Signed)
Sandy from ENT - Renae Fickle[facility] Bennett's office Faxed office notes along with autology report 3 times and Dr. Willeen CassBennett cc'ed Dr Sherrie MustacheFisher in his notes.  Dr. Sherrie MustacheFisher should have them.  Thanks, [employer]

## 2018-09-29 NOTE — Telephone Encounter (Signed)
Message taken by Leane Calleresa Hayes:  Andrey CampanileSandy from E[facility] - [facility]'s office called back stating that they Faxed office notes along with autology report 3 times and Dr. Willeen CassBennett cc'ed Dr Sherrie MustacheFisher in his notes.  Dr. Sherrie MustacheFisher should have them.

## 2018-09-29 NOTE — Telephone Encounter (Signed)
I spoke with Rosey Batheresa in Medical records. She states our system has been down, and now she has several electronic faxes to review. She will let me know if the fax from Dr. Geoffry ParadiseBennet's office is in there.

## 2018-10-03 ENCOUNTER — Telehealth: Payer: Self-pay | Admitting: Family Medicine

## 2018-10-03 DIAGNOSIS — H905 Unspecified sensorineural hearing loss: Secondary | ICD-10-CM

## 2018-10-03 NOTE — Telephone Encounter (Signed)
Records received and placed on Dr. Theodis AguasFisher's desk for review.

## 2018-10-03 NOTE — Telephone Encounter (Signed)
We still haven't received office notes from [facility]. I called Beth and requested that records be faxed once again. Awaiting report.

## 2018-10-03 NOTE — Telephone Encounter (Signed)
Patient is calling back to see if we have [place] [facility] [facility] [place].    She said its been well over 2 weeks and hasnt heard anything.

## 2018-11-03 ENCOUNTER — Ambulatory Visit (INDEPENDENT_AMBULATORY_CARE_PROVIDER_SITE_OTHER): Payer: Medicaid Other | Admitting: Family Medicine

## 2018-11-03 ENCOUNTER — Encounter: Payer: Self-pay | Admitting: Family Medicine

## 2018-11-03 VITALS — BP 139/86 | HR 79 | Wt 311.0 lb

## 2018-11-03 DIAGNOSIS — F410 Panic disorder [episodic paroxysmal anxiety] without agoraphobia: Secondary | ICD-10-CM

## 2018-11-03 DIAGNOSIS — F321 Major depressive disorder, single episode, moderate: Secondary | ICD-10-CM | POA: Diagnosis not present

## 2018-11-03 NOTE — Progress Notes (Signed)
    Subjective:    Patient ID: [name] is a 38 y.o. female presenting with Follow-up  on 11/03/2018  HPI: Feels better. Placed on Wellbutrin and started on Buspar. Notes improvement in racing thoughts. Has improved sleep, better mood. Motivation is better. Pateince is better and feels more evened out. Has some dizziness with Buspar, worse in the beginning and feeling better. Increased stress recently.  Review of Systems  Constitutional: Negative for chills and fever.  Respiratory: Negative for shortness of breath.   Cardiovascular: Negative for chest pain.  Gastrointestinal: Negative for abdominal pain, nausea and vomiting.  Genitourinary: Negative for dysuria.  Skin: Negative for rash.  Psychiatric/Behavioral: Positive for decreased concentration (mild).      Objective:    BP 139/86   Pulse 79   Wt (!) 311 lb (141.1 kg)   BMI 41.03 kg/m  Physical Exam Constitutional:      General: She is not in acute distress.    Appearance: She is well-developed.  HENT:     Head: Normocephalic and atraumatic.  Eyes:     General: No scleral icterus. Neck:     Musculoskeletal: Neck supple.  Cardiovascular:     Rate and Rhythm: Normal rate.  Pulmonary:     Effort: Pulmonary effort is normal.  Abdominal:     Palpations: Abdomen is soft.  Skin:    General: Skin is warm and dry.  Neurological:     Mental Status: She is alert and oriented to person, place, and time.         Assessment & Plan:   Problem List Items Addressed This Visit      Unprioritized   Episodic paroxysmal anxiety disorder - Primary    Continue Buspar      Depression, major, single episode    Continue Wellbutrin         Total face-to-face time with patient: 15 minutes. Over 50% of encounter was spent on counseling and coordination of care. Return if symptoms worsen or fail to improve.  Reva Bores 11/03/2018 9:16 AM

## 2018-11-03 NOTE — Assessment & Plan Note (Signed)
-   Continue Buspar 

## 2018-11-03 NOTE — Assessment & Plan Note (Signed)
-   Continue Wellbutrin 

## 2018-11-03 NOTE — Progress Notes (Signed)
Overall feeling better, does get dizzy from new medication.

## 2018-11-24 DIAGNOSIS — H9190 Unspecified hearing loss, unspecified ear: Secondary | ICD-10-CM | POA: Diagnosis not present

## 2018-12-15 DIAGNOSIS — H9192 Unspecified hearing loss, left ear: Secondary | ICD-10-CM | POA: Diagnosis not present

## 2018-12-15 DIAGNOSIS — H9190 Unspecified hearing loss, unspecified ear: Secondary | ICD-10-CM | POA: Diagnosis not present

## 2019-03-14 ENCOUNTER — Encounter: Payer: Self-pay | Admitting: Family Medicine

## 2019-03-14 ENCOUNTER — Other Ambulatory Visit: Payer: Self-pay

## 2019-03-14 ENCOUNTER — Ambulatory Visit (INDEPENDENT_AMBULATORY_CARE_PROVIDER_SITE_OTHER): Payer: Medicaid Other | Admitting: Family Medicine

## 2019-03-14 VITALS — BP 146/97 | HR 89 | Wt 313.2 lb

## 2019-03-14 DIAGNOSIS — N96 Recurrent pregnancy loss: Secondary | ICD-10-CM

## 2019-03-14 DIAGNOSIS — R3989 Other symptoms and signs involving the genitourinary system: Secondary | ICD-10-CM | POA: Diagnosis not present

## 2019-03-14 LAB — POCT URINE QUALITATIVE DIPSTICK BLOOD: Blood, UA: NEGATIVE

## 2019-03-14 MED ORDER — PHENAZOPYRIDINE HCL 200 MG PO TABS: 200.0000 mg | ORAL_TABLET | Freq: Three times a day (TID) | ORAL | 0 refills | Status: DC | PRN

## 2019-03-14 NOTE — Progress Notes (Signed)
    Subjective:    Patient ID: [name] is a 38 y.o. female presenting with Discuss Miscarriage and Bladder pain  on 03/14/2019  HPI: 6 days of pain over bladder. Nagging, stabbing, sharp pain. Denies dysuria, hematuria, frequency.  Has only had one bladder infection in the past without symptoms. Denies F/C, Nausea, vomiting. Radiating to low back. Has long h/o kidney stones, has passed 13.  Gets very cold in the evening related to fatigue or low grade fever. Has had another miscarriage at 7 wks at the beginning of May. It appeared like a mass and dark colored. No further bleeding., after 16 wk loss  Review of Systems  Constitutional: Negative for chills and fever.  Respiratory: Negative for shortness of breath.   Cardiovascular: Negative for chest pain.  Gastrointestinal: Negative for abdominal pain, nausea and vomiting.  Genitourinary: Negative for dysuria.  Skin: Negative for rash.      Objective:    BP (!) 146/97   Pulse 89   Wt (!) 313 lb 3.2 oz (142.1 kg)   LMP  (Approximate)   BMI 41.32 kg/m  Physical Exam Constitutional:      General: She is not in acute distress.    Appearance: She is well-developed.  HENT:     Head: Normocephalic and atraumatic.  Eyes:     General: No scleral icterus. Neck:     Musculoskeletal: Neck supple.  Cardiovascular:     Rate and Rhythm: Normal rate.  Pulmonary:     Effort: Pulmonary effort is normal.  Abdominal:     Palpations: Abdomen is soft.  Skin:    General: Skin is warm and dry.  Neurological:     Mental Status: She is alert and oriented to person, place, and time.    Component 10:59  Blood, UA NEGATIVE         Assessment & Plan:   Problem List Items Addressed This Visit    None    Visit Diagnoses    Bladder pain    -  Primary   Unclear etiology--midline--no typical UTI sx's. Check culture. U/A is negative in office. Could be bladder stone?   Relevant Orders   POCT urine qual dipstick blood (Completed)   Urine Culture   Recurrent pregnancy loss       has many normal kids--doubt needs karyotype or eval of uterine cavity--will r/o dm, thyroid, and check anticardiolipin abs.   Relevant Orders   TSH   Hemoglobin A1c   Cardiolipin antibodies, IgG, IgM, IgA   Lupus anticoagulant panel( [facility])      Total face-to-face time with patient: 15 minutes. Over 50% of encounter was spent on counseling and coordination of care. Return if symptoms worsen or fail to improve.  Reva Bores 03/14/2019 10:35 AM

## 2019-03-14 NOTE — Addendum Note (Signed)
Addended by: Cheree Ditto, DEMETRICE A on: 03/14/2019 03:41 PM   Modules accepted: Orders

## 2019-03-14 NOTE — Progress Notes (Signed)
Patient would like to discuss her miscarriages. She also reports having some bladder pain. Last pap 08/30/2018 -normal

## 2019-03-15 LAB — URINE CULTURE

## 2019-03-16 LAB — LUPUS ANTICOAGULANT PANEL
Dilute Viper Venom Time: 47.4 s — ABNORMAL HIGH (ref 0.0–47.0)
PTT Lupus Anticoagulant: 41.5 s (ref 0.0–51.9)

## 2019-03-16 LAB — DRVVT MIX: dRVVT Mix: 40 s (ref 0.0–47.0)

## 2019-03-16 LAB — CARDIOLIPIN ANTIBODIES, IGG, IGM, IGA
Anticardiolipin IgA: <9 APL U/mL (ref 0–11)
Anticardiolipin IgG: <9 GPL U/mL (ref 0–14)
Anticardiolipin IgM: <9 MPL U/mL (ref 0–12)

## 2019-03-16 LAB — TSH: TSH: 0.974 u[IU]/mL (ref 0.450–4.500)

## 2019-03-16 LAB — HEMOGLOBIN A1C
Est. average glucose Bld gHb Est-mCnc: 114 mg/dL
Hgb A1c MFr Bld: 5.6 % (ref 4.8–5.6)

## 2019-04-17 ENCOUNTER — Encounter (HOSPITAL_COMMUNITY): Payer: Self-pay

## 2019-04-17 ENCOUNTER — Observation Stay (HOSPITAL_COMMUNITY)
Admission: EM | Admit: 2019-04-17 | Discharge: 2019-04-19 | Disposition: A | Discharge status: Home / Self Care | Payer: Medicaid Other | Attending: Internal Medicine | Admitting: Internal Medicine

## 2019-04-17 ENCOUNTER — Emergency Department (HOSPITAL_COMMUNITY): Payer: Medicaid Other

## 2019-04-17 ENCOUNTER — Other Ambulatory Visit: Payer: Self-pay

## 2019-04-17 DIAGNOSIS — R509 Fever, unspecified: Secondary | ICD-10-CM | POA: Diagnosis not present

## 2019-04-17 DIAGNOSIS — Z7982 Long term (current) use of aspirin: Secondary | ICD-10-CM | POA: Insufficient documentation

## 2019-04-17 DIAGNOSIS — Z6839 Body mass index (BMI) 39.0-39.9, adult: Secondary | ICD-10-CM | POA: Insufficient documentation

## 2019-04-17 DIAGNOSIS — Z8759 Personal history of other complications of pregnancy, childbirth and the puerperium: Secondary | ICD-10-CM | POA: Insufficient documentation

## 2019-04-17 DIAGNOSIS — Z8249 Family history of ischemic heart disease and other diseases of the circulatory system: Secondary | ICD-10-CM | POA: Diagnosis not present

## 2019-04-17 DIAGNOSIS — I10 Essential (primary) hypertension: Secondary | ICD-10-CM | POA: Diagnosis not present

## 2019-04-17 DIAGNOSIS — K529 Noninfective gastroenteritis and colitis, unspecified: Secondary | ICD-10-CM | POA: Diagnosis not present

## 2019-04-17 DIAGNOSIS — F419 Anxiety disorder, unspecified: Secondary | ICD-10-CM | POA: Insufficient documentation

## 2019-04-17 DIAGNOSIS — H9192 Unspecified hearing loss, left ear: Secondary | ICD-10-CM | POA: Diagnosis not present

## 2019-04-17 DIAGNOSIS — R079 Chest pain, unspecified: Principal | ICD-10-CM | POA: Insufficient documentation

## 2019-04-17 DIAGNOSIS — R197 Diarrhea, unspecified: Secondary | ICD-10-CM

## 2019-04-17 DIAGNOSIS — Z8632 Personal history of gestational diabetes: Secondary | ICD-10-CM | POA: Insufficient documentation

## 2019-04-17 DIAGNOSIS — R778 Other specified abnormalities of plasma proteins: Secondary | ICD-10-CM

## 2019-04-17 DIAGNOSIS — R000 Tachycardia, unspecified: Secondary | ICD-10-CM | POA: Diagnosis not present

## 2019-04-17 DIAGNOSIS — Z20828 Contact with and (suspected) exposure to other viral communicable diseases: Secondary | ICD-10-CM | POA: Insufficient documentation

## 2019-04-17 DIAGNOSIS — R739 Hyperglycemia, unspecified: Secondary | ICD-10-CM | POA: Diagnosis not present

## 2019-04-17 DIAGNOSIS — Z79899 Other long term (current) drug therapy: Secondary | ICD-10-CM | POA: Insufficient documentation

## 2019-04-17 DIAGNOSIS — Z888 Allergy status to other drugs, medicaments and biological substances status: Secondary | ICD-10-CM | POA: Diagnosis not present

## 2019-04-17 DIAGNOSIS — Z91041 Radiographic dye allergy status: Secondary | ICD-10-CM | POA: Diagnosis not present

## 2019-04-17 DIAGNOSIS — Z87891 Personal history of nicotine dependence: Secondary | ICD-10-CM | POA: Diagnosis not present

## 2019-04-17 DIAGNOSIS — R7989 Other specified abnormal findings of blood chemistry: Secondary | ICD-10-CM | POA: Diagnosis not present

## 2019-04-17 DIAGNOSIS — E669 Obesity, unspecified: Secondary | ICD-10-CM | POA: Diagnosis not present

## 2019-04-17 DIAGNOSIS — A09 Infectious gastroenteritis and colitis, unspecified: Secondary | ICD-10-CM | POA: Diagnosis not present

## 2019-04-17 HISTORY — DX: Unspecified hearing loss, left ear: H91.92

## 2019-04-17 HISTORY — DX: Angina pectoris, unspecified: I20.9

## 2019-04-17 LAB — I-STAT BETA HCG BLOOD, ED (MC, WL, AP ONLY): I-stat hCG, quantitative: <5 m[IU]/mL (ref <5)

## 2019-04-17 LAB — CBC WITH DIFFERENTIAL/PLATELET
Abs Immature Granulocytes: 0 10*3/uL (ref 0.00–0.07)
Basophils Absolute: 0 10*3/uL (ref 0.0–0.1)
Basophils Relative: 0 %
Eosinophils Absolute: 0 10*3/uL (ref 0.0–0.5)
Eosinophils Relative: 0 %
HCT: 41.3 % (ref 36.0–46.0)
Hemoglobin: 13.2 g/dL (ref 12.0–15.0)
Lymphocytes Relative: 29 %
Lymphs Abs: 2.3 10*3/uL (ref 0.7–4.0)
MCH: 26 pg (ref 26.0–34.0)
MCHC: 32 g/dL (ref 30.0–36.0)
MCV: 81.5 fL (ref 80.0–100.0)
Monocytes Absolute: 0.6 10*3/uL (ref 0.1–1.0)
Monocytes Relative: 7 %
Neutro Abs: 5.1 10*3/uL (ref 1.7–7.7)
Neutrophils Relative %: 64 %
Platelets: 269 10*3/uL (ref 150–400)
RBC: 5.07 MIL/uL (ref 3.87–5.11)
RDW: 14.3 % (ref 11.5–15.5)
WBC: 7.9 10*3/uL (ref 4.0–10.5)
nRBC: 0 % (ref 0.0–0.2)
nRBC: 0 /100 WBC

## 2019-04-17 LAB — COMPREHENSIVE METABOLIC PANEL
ALT: 24 U/L (ref 0–44)
AST: 23 U/L (ref 15–41)
Albumin: 3.2 g/dL — ABNORMAL LOW (ref 3.5–5.0)
Alkaline Phosphatase: 42 U/L (ref 38–126)
Anion gap: 13 (ref 5–15)
BUN: 17 mg/dL (ref 6–20)
CO2: 21 mmol/L — ABNORMAL LOW (ref 22–32)
Calcium: 8.9 mg/dL (ref 8.9–10.3)
Chloride: 99 mmol/L (ref 98–111)
Creatinine, Ser: 1.05 mg/dL — ABNORMAL HIGH (ref 0.44–1.00)
GFR calc Af Amer: >60 mL/min (ref >60)
GFR calc non Af Amer: >60 mL/min (ref >60)
Glucose, Bld: 112 mg/dL — ABNORMAL HIGH (ref 70–99)
Potassium: 3.6 mmol/L (ref 3.5–5.1)
Sodium: 133 mmol/L — ABNORMAL LOW (ref 135–145)
Total Bilirubin: 0.5 mg/dL (ref 0.3–1.2)
Total Protein: 7 g/dL (ref 6.5–8.1)

## 2019-04-17 LAB — LACTIC ACID, PLASMA: Lactic Acid, Venous: 1.1 mmol/L (ref 0.5–1.9)

## 2019-04-17 LAB — TROPONIN I (HIGH SENSITIVITY): Troponin I (High Sensitivity): 193 ng/L (ref <18)

## 2019-04-17 MED ORDER — SODIUM CHLORIDE 0.9% FLUSH: 3.0000 mL | Freq: Once | INTRAVENOUS | Status: DC

## 2019-04-17 NOTE — ED Triage Notes (Signed)
Pt reports generalized fatigue, fever, diarrhea since Thursday. Pt denies any COVID exposure but states her 39 year old son had a stomach bug 2 weeks ago. Pt tachycardic in triage HR120. Taking motrin OTC for fever, last dose about 2 hrs ago. Pt a.o

## 2019-04-18 ENCOUNTER — Observation Stay (HOSPITAL_BASED_OUTPATIENT_CLINIC_OR_DEPARTMENT_OTHER): Payer: Medicaid Other

## 2019-04-18 ENCOUNTER — Encounter (HOSPITAL_COMMUNITY): Payer: Self-pay | Admitting: *Deleted

## 2019-04-18 DIAGNOSIS — R7989 Other specified abnormal findings of blood chemistry: Secondary | ICD-10-CM | POA: Diagnosis not present

## 2019-04-18 DIAGNOSIS — K529 Noninfective gastroenteritis and colitis, unspecified: Secondary | ICD-10-CM | POA: Diagnosis not present

## 2019-04-18 DIAGNOSIS — E669 Obesity, unspecified: Secondary | ICD-10-CM

## 2019-04-18 DIAGNOSIS — R739 Hyperglycemia, unspecified: Secondary | ICD-10-CM

## 2019-04-18 DIAGNOSIS — R072 Precordial pain: Secondary | ICD-10-CM | POA: Diagnosis not present

## 2019-04-18 DIAGNOSIS — R079 Chest pain, unspecified: Secondary | ICD-10-CM

## 2019-04-18 DIAGNOSIS — I10 Essential (primary) hypertension: Secondary | ICD-10-CM

## 2019-04-18 LAB — URINALYSIS, ROUTINE W REFLEX MICROSCOPIC
Bilirubin Urine: NEGATIVE
Glucose, UA: NEGATIVE mg/dL
Hgb urine dipstick: NEGATIVE
Ketones, ur: 80 mg/dL — AB
Leukocytes,Ua: NEGATIVE
Nitrite: NEGATIVE
Protein, ur: 100 mg/dL — AB
Specific Gravity, Urine: 1.026 (ref 1.005–1.030)
pH: 6 (ref 5.0–8.0)

## 2019-04-18 LAB — HEMOGLOBIN A1C
Hgb A1c MFr Bld: 5.5 % (ref 4.8–5.6)
Mean Plasma Glucose: 111.15 mg/dL

## 2019-04-18 LAB — ECHOCARDIOGRAM COMPLETE
Height: 73 in
Weight: 4780.8 oz

## 2019-04-18 LAB — SARS CORONAVIRUS 2 BY RT PCR (HOSPITAL ORDER, PERFORMED IN ~~LOC~~ HOSPITAL LAB): SARS Coronavirus 2: NEGATIVE

## 2019-04-18 LAB — LIPID PANEL
Cholesterol: 126 mg/dL (ref 0–200)
HDL: 38 mg/dL — ABNORMAL LOW (ref >40)
LDL Cholesterol: 72 mg/dL (ref 0–99)
Total CHOL/HDL Ratio: 3.3 RATIO
Triglycerides: 80 mg/dL (ref <150)
VLDL: 16 mg/dL (ref 0–40)

## 2019-04-18 LAB — TROPONIN I (HIGH SENSITIVITY): Troponin I (High Sensitivity): 97 ng/L — ABNORMAL HIGH (ref <18)

## 2019-04-18 LAB — LACTIC ACID, PLASMA: Lactic Acid, Venous: 0.9 mmol/L (ref 0.5–1.9)

## 2019-04-18 LAB — TSH: TSH: 1.791 u[IU]/mL (ref 0.350–4.500)

## 2019-04-18 MED ORDER — BUPROPION HCL 100 MG PO TABS
100.0000 mg | ORAL_TABLET | Freq: Two times a day (BID) | ORAL | Status: DC
  Administered 2019-04-18 – 2019-04-19 (×3): 100 mg via ORAL
  Filled 2019-04-18 (×4): qty 1

## 2019-04-18 MED ORDER — DIPHENHYDRAMINE HCL 25 MG PO CAPS: 50.0000 mg | ORAL_CAPSULE | Freq: Once | ORAL | Status: DC

## 2019-04-18 MED ORDER — METOPROLOL TARTRATE 50 MG PO TABS: 50.0000 mg | ORAL_TABLET | Freq: Once | ORAL | Status: DC

## 2019-04-18 MED ORDER — ASPIRIN EC 81 MG PO TBEC
81.0000 mg | DELAYED_RELEASE_TABLET | Freq: Every day | ORAL | Status: DC
  Administered 2019-04-19: 81 mg via ORAL
  Filled 2019-04-18: qty 1

## 2019-04-18 MED ORDER — DIPHENHYDRAMINE HCL 25 MG PO CAPS
50.0000 mg | ORAL_CAPSULE | Freq: Once | ORAL | Status: AC
  Administered 2019-04-19: 50 mg via ORAL
  Filled 2019-04-18: qty 2

## 2019-04-18 MED ORDER — SODIUM CHLORIDE 0.9 % IV BOLUS
500.0000 mL | Freq: Once | INTRAVENOUS | Status: AC
  Administered 2019-04-18: 500 mL via INTRAVENOUS

## 2019-04-18 MED ORDER — MORPHINE SULFATE (PF) 4 MG/ML IV SOLN
4.0000 mg | Freq: Once | INTRAVENOUS | Status: AC
  Administered 2019-04-18: 4 mg via INTRAVENOUS
  Filled 2019-04-18: qty 1

## 2019-04-18 MED ORDER — PREDNISONE 50 MG PO TABS: 50.0000 mg | ORAL_TABLET | Freq: Four times a day (QID) | ORAL | Status: DC

## 2019-04-18 MED ORDER — ACETAMINOPHEN 325 MG PO TABS: 650.0000 mg | ORAL_TABLET | ORAL | Status: DC | PRN

## 2019-04-18 MED ORDER — DIPHENHYDRAMINE HCL 50 MG/ML IJ SOLN
50.0000 mg | Freq: Once | INTRAMUSCULAR | Status: AC
  Filled 2019-04-18: qty 1

## 2019-04-18 MED ORDER — LACTATED RINGERS IV SOLN
INTRAVENOUS | Status: DC
  Administered 2019-04-18: 11:00:00 via INTRAVENOUS

## 2019-04-18 MED ORDER — NITROGLYCERIN 0.4 MG SL SUBL
0.4000 mg | SUBLINGUAL_TABLET | SUBLINGUAL | Status: AC | PRN
  Administered 2019-04-18 (×2): 0.4 mg via SUBLINGUAL
  Administered 2019-04-19: 0.8 mg via SUBLINGUAL
  Filled 2019-04-18: qty 1

## 2019-04-18 MED ORDER — ONDANSETRON HCL 4 MG/2ML IJ SOLN: 4.0000 mg | Freq: Four times a day (QID) | INTRAMUSCULAR | Status: DC | PRN

## 2019-04-18 MED ORDER — LABETALOL HCL 200 MG PO TABS
200.0000 mg | ORAL_TABLET | Freq: Two times a day (BID) | ORAL | Status: DC
  Administered 2019-04-18 – 2019-04-19 (×3): 200 mg via ORAL
  Filled 2019-04-18 (×3): qty 1

## 2019-04-18 MED ORDER — ASPIRIN 81 MG PO CHEW
324.0000 mg | CHEWABLE_TABLET | Freq: Once | ORAL | Status: AC
  Administered 2019-04-18: 324 mg via ORAL
  Filled 2019-04-18: qty 4

## 2019-04-18 MED ORDER — METOPROLOL TARTRATE 50 MG PO TABS
50.0000 mg | ORAL_TABLET | Freq: Once | ORAL | Status: AC
  Administered 2019-04-19: 50 mg via ORAL
  Filled 2019-04-18: qty 1

## 2019-04-18 MED ORDER — ENOXAPARIN SODIUM 40 MG/0.4ML ~~LOC~~ SOLN
40.0000 mg | SUBCUTANEOUS | Status: DC
  Administered 2019-04-18 – 2019-04-19 (×2): 40 mg via SUBCUTANEOUS
  Filled 2019-04-18 (×2): qty 0.4

## 2019-04-18 MED ORDER — DIPHENHYDRAMINE HCL 50 MG/ML IJ SOLN: 50.0000 mg | Freq: Once | INTRAMUSCULAR | Status: DC

## 2019-04-18 MED ORDER — PREDNISONE 50 MG PO TABS
50.0000 mg | ORAL_TABLET | Freq: Four times a day (QID) | ORAL | Status: AC
  Administered 2019-04-18 – 2019-04-19 (×3): 50 mg via ORAL
  Filled 2019-04-18 (×3): qty 1

## 2019-04-18 NOTE — ED Notes (Signed)
Pt. To go to [place] after 7:20 am.  Santiago Glad RN to give report to on coming nurse.

## 2019-04-18 NOTE — ED Notes (Signed)
Admitting at bedside 

## 2019-04-18 NOTE — Plan of Care (Signed)
  Problem: Activity: Goal: Risk for activity intolerance will decrease Outcome: Progressing   Problem: Coping: Goal: Level of anxiety will decrease Outcome: Progressing   Problem: Safety: Goal: Ability to remain free from injury will improve Outcome: Progressing   

## 2019-04-18 NOTE — Progress Notes (Signed)
Patient came to the floor at 0757 alert and oriented, denies pain , v/s stable will page MD for order and continue to monitor the patient.

## 2019-04-18 NOTE — H&P (Signed)
History and Physical    Jackie BrucknerJennifer L Brown WUJ:[id number] DOB: [birth year] DOA: 04/17/2019  PCP: [name], Jackie E, MD Consultants:  [name] - OB/GYN Patient coming from:  Home - lives with husband, 6 children, her parents; NOK: Husband, [phone]  Chief Complaint: chest pain  HPI: Jackie BrucknerJennifer L Brown is a 38 y.o. female with medical history significant of anxiety; obesity (BMI 39); and pregnancy-associated HTN/GDM presenting with chest pain.  Thursday, she woke up feeling bad.  She had a feeling in her mid-abdomen, maybe indigestion but no better with prilosec.  She developed diarrhea, fever to 102, shaking chills.  The pain was "like an uncomfortable, spreading, gnawing feeling."  She has been unable to eat since Thursday and has had ongoing watery diarrhea since then.  She has had very little energy.  She felt "like the life had been sucked" from her body.  Crampy pains improved with diarrhea.  Yesterday, she felt extremely dehydrated.  Last night about 6 pm she had severe pain across her diaphragm and throughout her chest, behind her breasts.  It was extremely painful, 8-9/10 and lasted about 20 minutes and started to slowly subside.  She felt "out of control of my body."  The pain recurred, 7/10, and she decided she needed evaluation.  The pain would not resolve but waxed and waned.  She currently feels "about the same" - she got a couple of hours of sleep but feels now like the pain might be starting to come back, 1-2/10.  Last stool was in the waiting room.  Abdominal cramping has resolved.  Fever is better - it was continuous from Thursday to Monday.   ED Course: Carryover, per Dr. Loney Brown:  Patient having diarrhea and fevers since Thursday. Experienced 9 out of 10 intensity chest pain last night. EKG negative. HS troponin 193. ED provider spoke to cardiology who did not have any recommendation at this time. Cardiology recommended hospitalist admission and trending troponin, recommended  reconsulting them in the morning if needed. Patient received aspirin and nitroglycerin in the ED. Chest pain has improved. COVID negative. Not hypoxic or febrile in the ED. Chest x-ray clear. No focal abdominal pain on exam.  Review of Systems: As per HPI; otherwise review of systems reviewed and negative.   Ambulatory Status:  Ambulates without assistance  Past Medical History:  Diagnosis Date  . Anginal pain (HCC)   . Anxiety   . Gestational diabetes 2008    glyburide this pregnancy  . History of chicken pox   . Kidney stones 2013   13 passed  . Pregnancy induced hypertension    labetolol  . Preterm labor    delivery    Past Surgical History:  Procedure Laterality Date  . CESAREAN SECTION  2006  . WISDOM TOOTH EXTRACTION     x4    Social History   Socioeconomic History  . Marital status: Married    Spouse name: Not on file  . Number of children: Not on file  . Years of education: Not on file  . Highest education level: Not on file  Occupational History  . 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 Financial resource strain: Not on file  . Food insecurity    Worry: Not on file    Inability: Not on file  . Transportation needs    Medical: Not on file    Non-medical: Not on file  Tobacco Use  . Smoking status: Former Games developermoker  .  Smokeless tobacco: Never Used  Substance and Sexual Activity  . Alcohol use: No    Alcohol/week: 0.0 standard drinks  . Drug use: No  . Sexual activity: Yes    Partners: Male    Birth control/protection: None  Lifestyle  . Physical activity    Days per week: Not on file    Minutes per session: Not on file  . Stress: Not on file  Relationships  . Social Musicianconnections    Talks on phone: Not on file    Gets together: Not on file    Attends religious service: Not on file    Active member of club or organization: Not on file    Attends meetings of clubs or organizations: Not on file     Relationship status: Not on file  . Intimate partner violence    Fear of current or ex partner: Not on file    Emotionally abused: Not on file    Physically abused: Not on file    Forced sexual activity: Not on file  Other Topics Concern  . Not on file  Social History Narrative  . Not on file    Allergies  Allergen Reactions  . Ivp Dye [Iodinated Diagnostic Agents] Anaphylaxis  . Iodine   . Phenergan Fortis [Promethazine] Itching    Itching/pins and needles for hours.    Family History  Problem Relation Age of Onset  . Schizophrenia Maternal Grandmother   . Atrial fibrillation Mother   . Depression Mother   . Anxiety disorder Mother   . Mental illness Father   . Heart attack Maternal Grandfather   . Atrial fibrillation Paternal Grandfather   . Diabetes Paternal Grandfather   . Heart attack Paternal Grandfather   . Allergies Sister     Prior to Admission medications   Medication Sig Start Date End Date Taking? Authorizing Provider  buPROPion (WELLBUTRIN) 100 MG tablet Take 1 tablet (100 mg total) by mouth 2 (two) times daily. 09/27/18  Yes Reva BoresPratt, Tanya S, MD  labetalol (NORMODYNE) 200 MG tablet Take 1 tablet (200 mg total) by mouth every 12 (twelve) hours. 06/21/18  Yes Anola Gurneyhauvin, Robert, PA  busPIRone (BUSPAR) 7.5 MG tablet Take 1 tablet (7.5 mg total) by mouth 2 (two) times daily. Patient not taking: Reported on 03/14/2019 09/27/18   Reva BoresPratt, Tanya S, MD  hydrochlorothiazide (HYDRODIURIL) 25 MG tablet Take 1 tablet (25 mg total) by mouth daily. Patient not taking: Reported on 03/14/2019 07/13/18   [name], Jackie E, MD  phenazopyridine (PYRIDIUM) 200 MG tablet Take 1 tablet (200 mg total) by mouth 3 (three) times daily as needed for pain (urethral spasm). Patient not taking: Reported on 04/18/2019 03/14/19   Reva BoresPratt, Tanya S, MD    Physical Exam: Vitals:   04/18/19 0530 04/18/19 0715 04/18/19 0730 04/18/19 0759  BP: 131/68 (!) 143/72 134/75 (!) 143/70  Pulse: 94 98 91 (!) 110   Resp: (!) 23 (!) 21 (!) 25 18  Temp:    98.9 F (37.2 C)  TempSrc:    Oral  SpO2: 94% 97% 97% 100%  Weight:    135.5 kg  Height:    6\' 1"  (1.854 m)     . General:  Appears calm and comfortable and is NAD; mildly fatigued-appearing . Eyes:  PERRL, EOMI, normal lids, iris . ENT:  grossly normal hearing, lips & tongue, mmm; appropriate dentition . Neck:  no LAD, masses or thyromegaly . Cardiovascular:  RRR, no m/r/g. No LE edema.  Marland Kitchen. Respiratory:  CTA bilaterally with no wheezes/rales/rhonchi.  Mildly increased respiratory effort. . Abdomen:  soft, NT, ND, NABS, obese . Skin:  no rash or induration seen on limited exam . Musculoskeletal:  grossly normal tone BUE/BLE, good ROM, no bony abnormality . Psychiatric:  grossly normal mood and affect, speech fluent and appropriate, AOx3 . Neurologic:  CN 2-12 grossly intact, moves all extremities in coordinated fashion, sensation intact    Radiological Exams on Admission: Dg Chest 2 View  Result Date: 04/17/2019 CLINICAL DATA:  Fever, tachycardia EXAM: CHEST - 2 VIEW COMPARISON:  None. FINDINGS: The heart size and mediastinal contours are within normal limits. Both lungs are clear. The visualized skeletal structures are unremarkable. IMPRESSION: No acute abnormality of the lungs.  No focal airspace opacity. Electronically Signed   By: Lauralyn PrimesAlex  Bibbey M.D.   On: 04/17/2019 21:54    EKG: Independently reviewed.  Sinus tachycardia with rate 115; nonspecific ST changes with no evidence of acute ischemia   Labs on Admission: I have personally reviewed the available labs and imaging studies at the time of the admission.  Pertinent labs:   Na++ 133 Glucose 112 BUN 17/Creatinine 1.05/GFR >60 HS troponin 193, 97 Lactate 1.1, 0.9 Normal CBC HCG negative COVID negative  Assessment/Plan Principal Problem:   Chest pain Active Problems:   Obesity (BMI 35.0-39.9 without comorbidity)   Paroxysmal hypertension   Gastroenteritis   Hyperglycemia    Chest pain -Patient with substernal chest pain/pressure overnight, following gastroenteritis; relieved with NTG  -CXR unremarkable.   -HS troponin significantly elevated, ?demand ischemia -EKG not indicative of acute ischemia.   -HEART score is 3, indicating that the patient has a low risk score; however, her HS troponin is elevated and so she requires further evaluation. -Will plan to place in observation status on telemetry  -Repeat EKG in AM -Start ASA 81 mg daily -Cardiology consult, as she may need a coronary CTA for further evaluation -Risk factor stratification with HgbA1c and FLP; will also check TSH and UDS  Gastroenteritis -Patient's 2yo son had GI symptoms recently and then she started last Thursday with refractory diarrhea as well as fever to 102. -Fever finally resolved yesterday -Last stool was in the ER waiting room -Will hydrate -Possibly the source of patient's demand ischemia  HTN -Takes labetolol monotherapy at home  Obesity -Needs to be encouraged to diet/exercise for weight loss -Consider bariatric medicine vs. Surgery evaluation  Hyperglycemia -Glucose mildly elevated -Check A1c   Note: This patient has been tested and is negative for the novel coronavirus COVID-19.   DVT prophylaxis: Lovenox  Code Status:  Full Family Communication: None present Disposition Plan:  Home once clinically improved Consults called: Cardiology  Admission status: It is my clinical opinion that referral for OBSERVATION is reasonable and necessary in this patient based on the above information provided. The aforementioned taken together are felt to place the patient at high risk for further clinical deterioration. However it is anticipated that the patient may be medically stable for discharge from the hospital within 24 to 48 hours.    Jonah BlueJennifer Allen Basista MD [facility]   How to contact the [facility] Attending or Consulting provider 7A - 7P or covering provider during  after hours 7P -7A, for this patient?  1. Check the care team in [facility] and look for a) attending/consulting [facility] provider listed and b) the [facility] team listed 2. Log into [email or web address] and use [facility] universal password to access. If you do not have the password, please contact the  hospital operator. 3. Locate the [facility] provider you are looking for under [facility] and page to a number that you can be directly reached. 4. If you still have difficulty reaching the provider, please page the [facility] (Director on Call) for the Hospitalists listed on amion for assistance.   04/18/2019, 9:36 AM

## 2019-04-18 NOTE — ED Notes (Signed)
Pt refused rectal temp. Pt states staff already got an oral temp 5x.

## 2019-04-18 NOTE — ED Notes (Signed)
Pt refused second attempt for rectal temp.

## 2019-04-18 NOTE — ED Notes (Signed)
2 SL NTG given with CP relief. Pt states "pain is between a 0 and 1. Some discomfort but no pain at this time. I think its gone."

## 2019-04-18 NOTE — ED Notes (Addendum)
ED PA at bedside. Will return to obtain COVID swab

## 2019-04-18 NOTE — ED Notes (Signed)
ED TO INPATIENT HANDOFF REPORT  ED Nurse Name and Phone #:  Dorette Grate [phone]  S Name/Age/Gender [name] 38 y.o. female Room/Bed: 023C/023C  Code Status   Code Status: Prior  Home/SNF/Other Home Patient oriented to: self, place, time and situation Is this baseline? Yes   Triage Complete: Triage complete  Chief Complaint CP, fever and diarrhea x5 days   Triage Note Pt reports generalized fatigue, fever, diarrhea since Thursday. Pt denies any COVID exposure but states her 108 year old son had a stomach bug 2 weeks ago. Pt tachycardic in triage HR120. Taking motrin OTC for fever, last dose about 2 hrs ago. Pt a.o   Allergies Allergies  Allergen Reactions  . Ivp Dye [Iodinated Diagnostic Agents] Anaphylaxis  . Iodine   . Phenergan Fortis [Promethazine] Itching    Itching/pins and needles for hours.    Level of Care/Admitting Diagnosis ED Disposition    ED Disposition Condition Comment   Admit  Hospital Area: [facility] [[id number]]  Level of Care: Telemetry Cardiac [103]  I expect the patient will be discharged within 24 hours: Yes  LOW acuity---Tx typically complete <24 hrs---ACUTE conditions typically can be evaluated <24 hours---LABS likely to return to acceptable levels <24 hours---IS near functional baseline---EXPECTED to return to current living arrangement---NOT newly hypoxic: Meets criteria for 5C-Observation unit  Covid Evaluation: Confirmed COVID Negative  Diagnosis: Chest pain [235361]  Admitting Physician: Shela Leff [[id number]]  Attending Physician: Shela Leff [[id number]]  PT Class (Do Not Modify): Observation [104]  PT Acc Code (Do Not Modify): Observation [10022]       B Medical/Surgery History Past Medical History:  Diagnosis Date  . Anxiety   . Gestational diabetes 2008    glyburide this pregnancy  . History of chicken pox   . Kidney stones 2013   13 passed  . Pregnancy induced hypertension    labetolol   . Preterm labor    delivery   Past Surgical History:  Procedure Laterality Date  . CESAREAN SECTION  2006  . WISDOM TOOTH EXTRACTION     x4     A IV Location/Drains/Wounds Patient Lines/Drains/Airways Status   Active Line/Drains/Airways    Name:   Placement date:   Placement time:   Site:   Days:   Peripheral IV 04/18/19 Left Antecubital   04/18/19    0223    Antecubital   less than 1          Intake/Output Last 24 hours  Intake/Output Summary (Last 24 hours) at 04/18/2019 0532 Last data filed at 04/18/2019 0329 Gross per 24 hour  Intake 500 ml  Output -  Net 500 ml    Labs/Imaging Results for orders placed or performed during the hospital encounter of 04/17/19 (from the past 48 hour(s))  Lactic acid, plasma     Status: None   Collection Time: 04/17/19  9:22 PM  Result Value Ref Range   Lactic Acid, Venous 1.1 0.5 - 1.9 mmol/L    Comment: Performed at [facility], [address] [place], [place] [address]  Comprehensive metabolic panel     Status: Abnormal   Collection Time: 04/17/19  9:22 PM  Result Value Ref Range   Sodium 133 (L) 135 - 145 mmol/L   Potassium 3.6 3.5 - 5.1 mmol/L   Chloride 99 98 - 111 mmol/L   CO2 21 (L) 22 - 32 mmol/L   Glucose, Bld 112 (H) 70 - 99 mg/dL   BUN 17  6 - 20 mg/dL   Creatinine, Ser 1.611.05 (H) 0.44 - 1.00 mg/dL   Calcium 8.9 8.9 - 09.610.3 mg/dL   Total Protein 7.0 6.5 - 8.1 g/dL   Albumin 3.2 (L) 3.5 - 5.0 g/dL   AST 23 15 - 41 U/L   ALT 24 0 - 44 U/L   Alkaline Phosphatase 42 38 - 126 U/L   Total Bilirubin 0.5 0.3 - 1.2 mg/dL   GFR calc non Af Amer >60 >60 mL/min   GFR calc Af Amer >60 >60 mL/min   Anion gap 13 5 - 15    Comment: Performed at [facility], [address] [place], [place] [address]  CBC with Differential     Status: None   Collection Time: 04/17/19  9:22 PM  Result Value Ref Range   WBC 7.9 4.0 - 10.5 K/uL   RBC 5.07 3.87 - 5.11 MIL/uL   Hemoglobin 13.2 12.0 - 15.0 g/dL   HCT 98.141.3 19.136.0 - 47.846.0 %    MCV 81.5 80.0 - 100.0 fL   MCH 26.0 26.0 - 34.0 pg   MCHC 32.0 30.0 - 36.0 g/dL   RDW 29.514.3 62.111.5 - 30.815.5 %   Platelets 269 150 - 400 K/uL   nRBC 0.0 0.0 - 0.2 %   Neutrophils Relative % 64 %   Neutro Abs 5.1 1.7 - 7.7 K/uL   Lymphocytes Relative 29 %   Lymphs Abs 2.3 0.7 - 4.0 K/uL   Monocytes Relative 7 %   Monocytes Absolute 0.6 0.1 - 1.0 K/uL   Eosinophils Relative 0 %   Eosinophils Absolute 0.0 0.0 - 0.5 K/uL   Basophils Relative 0 %   Basophils Absolute 0.0 0.0 - 0.1 K/uL   nRBC 0 0 /100 WBC   Abs Immature Granulocytes 0.00 0.00 - 0.07 K/uL    Comment: Performed at [facility], [address] [place], [place] [address]  Troponin I (High Sensitivity)     Status: Abnormal   Collection Time: 04/17/19  9:22 PM  Result Value Ref Range   Troponin I (High Sensitivity) 193 (HH) <18 ng/L    Comment: CRITICAL RESULT CALLED TO, READ BACK BY AND VERIFIED WITH:  B OLDLAND,RN 2225 04/17/2019 WBOND (NOTE) Elevated high sensitivity troponin I (hsTnI) values and significant  changes across serial measurements may suggest ACS but many other  chronic and acute conditions are known to elevate hsTnI results.  Refer to the Links section for chest pain algorithms and additional  guidance. Performed at [facility], [address] [place], [place] [address]   I-Stat beta hCG blood, ED     Status: None   Collection Time: 04/17/19  9:28 PM  Result Value Ref Range   I-stat hCG, quantitative <5.0 <5 mIU/mL   Comment 3            Comment:   GEST. AGE      CONC.  (mIU/mL)   <=1 WEEK        5 - 50     2 WEEKS       50 - 500     3 WEEKS       100 - 10,000     4 WEEKS     1,000 - 30,000        FEMALE AND NON-PREGNANT FEMALE:     LESS THAN 5 mIU/mL   Troponin I (High Sensitivity)     Status: Abnormal   Collection Time: 04/18/19  2:21 AM  Result Value Ref Range   Troponin I (High Sensitivity) 97 (H) <18 ng/L    Comment: DELTA CHECK NOTED (NOTE) Elevated high sensitivity troponin  I (hsTnI) values and significant  changes across serial measurements may suggest ACS but many other  chronic and acute conditions are known to elevate hsTnI results.  Refer to the Links section for chest pain algorithms and additional  guidance. Performed at [facility], [address] [place], [place] [address]   Lactic acid, plasma     Status: None   Collection Time: 04/18/19  2:23 AM  Result Value Ref Range   Lactic Acid, Venous 0.9 0.5 - 1.9 mmol/L    Comment: Performed at [facility], [address] [place], [place] [address]  SARS Coronavirus 2 (CEPHEID- Performed in [facility] hospital lab), Hosp Order     Status: None   Collection Time: 04/18/19  2:45 AM   Specimen: Nasopharyngeal Swab  Result Value Ref Range   SARS Coronavirus 2 NEGATIVE NEGATIVE    Comment: (NOTE) If result is NEGATIVE SARS-CoV-2 target nucleic acids are NOT DETECTED. The SARS-CoV-2 RNA is generally detectable in upper and lower  respiratory specimens during the acute phase of infection. The lowest  concentration of SARS-CoV-2 viral copies this assay can detect is 250  copies / mL. A negative result does not preclude SARS-CoV-2 infection  and should not be used as the sole basis for treatment or other  patient management decisions.  A negative result may occur with  improper specimen collection / handling, submission of specimen other  than nasopharyngeal swab, presence of viral mutation(s) within the  areas targeted by this assay, and inadequate number of viral copies  (<250 copies / mL). A negative result must be combined with clinical  observations, patient history, and epidemiological information. If result is POSITIVE SARS-CoV-2 target nucleic acids are DETECTED. The SARS-CoV-2 RNA is generally detectable in upper and lower  respiratory specimens dur ing the acute phase of infection.  Positive  results are indicative of active infection with SARS-CoV-2.  Clinical  correlation with  patient history and other diagnostic information is  necessary to determine patient infection status.  Positive results do  not rule out bacterial infection or co-infection with other viruses. If result is PRESUMPTIVE POSTIVE SARS-CoV-2 nucleic acids MAY BE PRESENT.   A presumptive positive result was obtained on the submitted specimen  and confirmed on repeat testing.  While 2019 novel coronavirus  (SARS-CoV-2) nucleic acids may be present in the submitted sample  additional confirmatory testing may be necessary for epidemiological  and / or clinical management purposes  to differentiate between  SARS-CoV-2 and other Sarbecovirus currently known to infect humans.  If clinically indicated additional testing with an alternate test  methodology [phone]([id number]) is advised. The SARS-CoV-2 RNA is generally  detectable in upper and lower respiratory sp ecimens during the acute  phase of infection. The expected result is Negative. Fact Sheet for Patients:  BoilerBrush.com.cyhttps://www.fda.gov/media/136312/download Fact Sheet for Healthcare Providers: https://pope.com/https://www.fda.gov/media/136313/download This test is not yet approved or cleared by the [place] FDA and has been authorized for detection and/or diagnosis of SARS-CoV-2 by FDA under an Emergency Use Authorization (EUA).  This EUA will remain in effect (meaning this test can be used) for the duration of the COVID-19 declaration under Section 564(b)(1) of the Act, 21 U.S.C. section 360bbb-3(b)(1), unless the authorization is terminated or revoked sooner. Performed at [facility], [address]  [place], [place] [address]    Dg Chest 2 View  Result Date: 04/17/2019 CLINICAL DATA:  Fever, tachycardia EXAM: CHEST - 2 VIEW COMPARISON:  None. FINDINGS: The heart size and mediastinal contours are within normal limits. Both lungs are clear. The visualized skeletal structures are unremarkable. IMPRESSION: No acute abnormality of the lungs.  No focal airspace  opacity. Electronically Signed   By: Lauralyn PrimesAlex  Bibbey M.D.   On: 04/17/2019 21:54    Pending Labs Unresulted Labs (From admission, onward)    Start     Ordered   04/17/19 2122  Urinalysis, Routine w reflex microscopic  ONCE - STAT,   STAT     04/17/19 2122          Vitals/Pain Today's Vitals   04/18/19 0430 04/18/19 0442 04/18/19 0500 04/18/19 0515  BP: (!) 142/92  (!) 141/67 (!) 147/89  Pulse: 95  93 94  Resp: (!) 22  (!) 22 (!) 21  Temp:      TempSrc:      SpO2: 95%  95% 96%  PainSc:  0-No pain      Isolation Precautions No active isolations  Medications Medications  sodium chloride flush (NS) 0.9 % injection 3 mL (has no administration in time range)  nitroGLYCERIN (NITROSTAT) SL tablet 0.4 mg (0.4 mg Sublingual Given 04/18/19 0330)  sodium chloride 0.9 % bolus 500 mL (0 mLs Intravenous Stopped 04/18/19 0329)  morphine 4 MG/ML injection 4 mg (4 mg Intravenous Given 04/18/19 0251)  aspirin chewable tablet 324 mg (324 mg Oral Given 04/18/19 0322)    Mobility walks with person assist Low fall risk   Focused Assessments Cardiac Assessment Handoff:  Cardiac Rhythm: Sinus tachycardia No results found for: CKTOTAL, CKMB, CKMBINDEX, TROPONINI No results found for: DDIMER Does the Patient currently have chest pain? No      R Recommendations: See Admitting Provider Note  Report given to:   Additional Notes:

## 2019-04-18 NOTE — Consult Note (Addendum)
Cardiology Consultation:   Patient ID: [name] MRN: [id number]; DOB: [birth year]  Admit date: 04/17/2019 Date of Consult: 04/18/2019  Primary Care Provider: Birdie Sons, MD Primary Cardiologist: New  Primary Electrophysiologist:  None    Patient Profile:   [name] is a 38 y.o. female with a hx of kidney stone and HTN  who is being seen today for the evaluation of chest pain and elevated troponin at the request of Dr. Lorin Mercy  History of Present Illness:   [name] presented to the ER 6/29 for severe diarrhea and a fever of 102 x 4 days. She had little energy and felt very weak. Patient tried motrin for fever but fever would return. Patient was unable to eat from Thursday to Monday. Patient noted that her 39 yo son had similar symptoms 2 weeks ago.On Monday patient started feeling nauseas and had epigastric pain that radiated into her chest. No vomiting. The pain was described as a sharp, gnawing pain that began in the lower left sternum and radiates upward into her chest. She denies any precipitating factors. The first episode gradually increased to a 9/10 and lasted for 20 minutes then resolved to 2/10.  Patient tried Prilosec with no relief. Patient went to the ER and pain was waxing and waning.   In the ER Cr was elevated 1.05. WBC 7.9, Glucose 112. HS Troponin was 193>>97. EKG showed Sinus tachycardia. CXR showed no acute abnormalities. There were no documented fevers in the ER. COVID negative. Patient was given ASA and Nitro and Chest pain improved. She was admitted for further cardiac work-up.  Patient has no significant cardiac history. Blood pressure was controlled on labetelol. Patient is currently with chest pain 2/10 gnawing with occasional sharp radiation into the sternum. She has never felt this kind of pain in the past. Patient reports 2 events of palpitations, but not recently. Patient denies diarrhea yesterday, but had 1 episode today.  Heart Pathway Score:      Past Medical History:  Diagnosis Date  . Anginal pain ([place])   . Anxiety   . Deaf, left   . Gestational diabetes 2008    glyburide this pregnancy  . History of chicken pox   . Kidney stones 2013   13 passed  . Pregnancy induced hypertension    labetolol  . Preterm labor    delivery    Past Surgical History:  Procedure Laterality Date  . CESAREAN SECTION  2006  . WISDOM TOOTH EXTRACTION     x4     Home Medications:  Prior to Admission medications   Medication Sig Start Date End Date Taking? Authorizing Provider  buPROPion (WELLBUTRIN) 100 MG tablet Take 1 tablet (100 mg total) by mouth 2 (two) times daily. 09/27/18  Yes Donnamae Jude, MD  labetalol (NORMODYNE) 200 MG tablet Take 1 tablet (200 mg total) by mouth every 12 (twelve) hours. 06/21/18  Yes Carmon Ginsberg, PA    Inpatient Medications: Scheduled Meds: . [START ON 04/19/2019] aspirin EC  81 mg Oral Daily  . buPROPion  100 mg Oral BID  . enoxaparin (LOVENOX) injection  40 mg Subcutaneous Q24H  . labetalol  200 mg Oral Q12H  . [START ON 04/19/2019] metoprolol tartrate  50 mg Oral Once   Continuous Infusions: . lactated ringers 100 mL/hr at 04/18/19 1054   PRN Meds: acetaminophen, nitroGLYCERIN, ondansetron (ZOFRAN) IV  Allergies:    Allergies  Allergen Reactions  . Ivp Dye [Iodinated Diagnostic Agents] Anaphylaxis  .  Iodine   . Phenergan Fortis [Promethazine] Itching    Itching/pins and needles for hours.    Social History:   Social History   Socioeconomic History  . Marital status: Married    Spouse name: Not on file  . Number of children: Not on file  . Years of education: Not on file  . Highest education level: Not on file  Occupational History  . 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 Financial resource strain: Not on file  . Food insecurity    Worry: Not on file    Inability: Not on file  . Transportation needs    Medical: Not on file    Non-medical: Not  on file  Tobacco Use  . Smoking status: Never Smoker  . Smokeless tobacco: Never Used  Substance and Sexual Activity  . Alcohol use: No    Alcohol/week: 0.0 standard drinks  . Drug use: No  . Sexual activity: Yes    Partners: Male    Birth control/protection: None  Lifestyle  . Physical activity    Days per week: Not on file    Minutes per session: Not on file  . Stress: Not on file  Relationships  . Social Musicianconnections    Talks on phone: Not on file    Gets together: Not on file    Attends religious service: Not on file    Active member of club or organization: Not on file    Attends meetings of clubs or organizations: Not on file    Relationship status: Not on file  . Intimate partner violence    Fear of current or ex partner: Not on file    Emotionally abused: Not on file    Physically abused: Not on file    Forced sexual activity: Not on file  Other Topics Concern  . Not on file  Social History Narrative  . Not on file    Family History:   Family History  Problem Relation Age of Onset  . Schizophrenia Maternal Grandmother   . Atrial fibrillation Mother   . Depression Mother   . Anxiety disorder Mother   . Mental illness Father   . Heart attack Maternal Grandfather   . Atrial fibrillation Paternal Grandfather   . Diabetes Paternal Grandfather   . Heart attack Paternal Grandfather   . Allergies Sister      ROS:  Please see the history of present illness.  All other ROS reviewed and negative.     Physical Exam/Data:   Vitals:   04/18/19 0715 04/18/19 0730 04/18/19 0759 04/18/19 1147  BP: (!) 143/72 134/75 (!) 143/70 125/79  Pulse: 98 91 (!) 110 94  Resp: (!) 21 (!) 25 18 17   Temp:   98.9 F (37.2 C) 98.8 F (37.1 C)  TempSrc:   Oral Oral  SpO2: 97% 97% 100% 96%  Weight:   135.5 kg   Height:   6\' 1"  (1.854 m)     Intake/Output Summary (Last 24 hours) at 04/18/2019 1409 Last data filed at 04/18/2019 1243 Gross per 24 hour  Intake 1082 ml  Output  300 ml  Net 782 ml   Last 3 Weights 04/18/2019 03/14/2019 11/03/2018  Weight (lbs) 298 lb 12.8 oz 313 lb 3.2 oz 311 lb  Weight (kg) 135.535 kg 142.067 kg 141.069 kg     Body mass index is 39.42 kg/m.  General:  Obese female in no acute distress HEENT: normal  Neck: no JVD Vascular: No carotid bruits; FA pulses 2+ bilaterally without bruits  Cardiac:  normal S1, S2; RRR; no murmur  Lungs:  clear to auscultation bilaterally, no wheezing, rhonchi or rales  Abd: soft, nontender, no hepatomegaly  Ext: no edema Musculoskeletal:  No deformities, BUE and BLE strength normal and equal Skin: warm and dry  Neuro:  CNs 2-12 intact, no focal abnormalities noted Psych:  Normal affect   EKG:  The EKG was personally reviewed and demonstrates:  Sinus tachycardia, HR115 Telemetry:  Telemetry was personally reviewed and demonstrates:  NSR, HR 90-100s, no arrhythmias noted  Relevant CV Studies:   N/A  Laboratory Data:  High Sensitivity Troponin:   Recent Labs  Lab 04/17/19 2122 04/18/19 0221  TROPONINIHS 193* 97*     Cardiac EnzymesNo results for input(s): TROPONINI in the last 168 hours. No results for input(s): TROPIPOC in the last 168 hours.  Chemistry Recent Labs  Lab 04/17/19 2122  NA 133*  K 3.6  CL 99  CO2 21*  GLUCOSE 112*  BUN 17  CREATININE 1.05*  CALCIUM 8.9  GFRNONAA >60  GFRAA >60  ANIONGAP 13    Recent Labs  Lab 04/17/19 2122  PROT 7.0  ALBUMIN 3.2*  AST 23  ALT 24  ALKPHOS 42  BILITOT 0.5   Hematology Recent Labs  Lab 04/17/19 2122  WBC 7.9  RBC 5.07  HGB 13.2  HCT 41.3  MCV 81.5  MCH 26.0  MCHC 32.0  RDW 14.3  PLT 269   BNPNo results for input(s): BNP, PROBNP in the last 168 hours.  DDimer No results for input(s): DDIMER in the last 168 hours.   Radiology/Studies:  Dg Chest 2 View  Result Date: 04/17/2019 CLINICAL DATA:  Fever, tachycardia EXAM: CHEST - 2 VIEW COMPARISON:  None. FINDINGS: The heart size and mediastinal contours are within  normal limits. Both lungs are clear. The visualized skeletal structures are unremarkable. IMPRESSION: No acute abnormality of the lungs.  No focal airspace opacity. Electronically Signed   By: Lauralyn PrimesAlex  Bibbey M.D.   On: 04/17/2019 21:54    Assessment and Plan:   1. Chest Pain/elevated troponin -chest pain felt to be more atypical, however improved with Nitroglycerin here -HS troponin was elevated in the ER, but since improved, 193>>97 - EKG shows sinus tach with T wave abnormalities -cardiac risk factors include HTN and Family history (CAD) -Order Echo to evaluate LV function -After discussing with MD will order CT coronary -NPO after midnight  2. HTN -home med includes labetelol -B/P controlled since admission  3. Diarrhea/Fever -COVID negative -symptoms consistent with gastroenteritis -evaluation per primary team  For questions or updates, please contact [facility] Please consult [email or web address] for contact info under   Attending Note: Patient seen, examined. Available data reviewed. Agree with findings, assessment, and plan as outlined by Cadence Fransico MichaelFurth, PA. On my exam today: Vitals:   04/18/19 0759 04/18/19 1147  BP: (!) 143/70 125/79  Pulse: (!) 110 94  Resp: 18 17  Temp: 98.9 F (37.2 C) 98.8 F (37.1 C)  SpO2: 100% 96%   Pt is alert and oriented, pleasant, obese woman in NAD HEENT: normal Neck: JVP - normal, carotids 2+= without bruits Lungs: CTA bilaterally CV: RRR without murmur or gallop Abd: soft, NT, Positive BS, no hepatomegaly Ext: no C/C/E, distal pulses intact and equal Skin: warm/dry no rash  The patient's history is well documented above with a febrile, diarrheal illness over several days.  She has been quite  sick with poor oral intake and frequent diarrhea and abdominal discomfort.  However, yesterday her symptoms changed and she developed lower sternal pain radiating upward into the chest as detailed above.  Her symptoms were relieved after 2  sublingual nitroglycerin.  Her troponin was noted to be mildly elevated with a high-sensitivity troponin of 193 and a second high-sensitivity troponin down to 97.  Cardiology consultation is obtained to evaluate chest pain and elevated troponin.  The patient has no personal history of coronary disease or cardiac problems.Her mother has had atrial fibrillation but there is no premature CAD in the family.  The patient CV risk factors include only hypertension and obesity.  Her EKG demonstrates nonspecific ST abnormality and sinus tachycardia.  I suspect she has minimally elevated troponin related to her general medical illness with fever and severe diarrhea.  However, her chest pain symptoms are somewhat concerning with relief after taking sublingual nitroglycerin.  I recommended an echocardiogram to assess LV function and any regional wall motion abnormalities.  I recommended a gated coronary CTA scan to evaluate for any obstructive CAD.  The patient will be premedicated for a radiocontrast allergy.  Further plan/disposition pending her test results.  Fortunately she is chest pain-free at present.  Signed, Tonny BollmanMichael Aurielle Slingerland, MD  04/18/2019 2:09 PM

## 2019-04-18 NOTE — Progress Notes (Signed)
  Echocardiogram 2D Echocardiogram has been performed.  [name] 04/18/2019, 2:55 PM

## 2019-04-18 NOTE — ED Provider Notes (Signed)
[facility] Provider Note   CSN: [id number] Arrival date & time: 04/17/19  2107    History   Chief Complaint Chief Complaint  Patient presents with  . Chest Pain  . Fever  . Fatigue    HPI [name] is a 38 y.o. female with a hx of kidney stones, HTN presents to the Emergency Department complaining of gradual, persistent, progressively worsening diarrhea and fevers to 102 onset Thursday (4 days ago). Pt reports associated abd cramping, but no intense pain at that time.  Pt reports she was able to sip water and did not have any vomiting.  Pt reports she developed epigastric abd pain that radiated into her chest beginning around 7pm.  Pt reports at its worst the pain was a 9/10 but is a 3/10 at this time.  Pt reports nausea without vomiting.  Pt reports persistent chills since the onset of her symptoms. Pt reports her 2yo son was sick with similar symptoms last week, but was never tested.  Associated symptoms include severe fatigue, myalgias. Pt reports motrin would break the fever, but it would return within several hours.  Pt denies leg swelling, calf tenderness.  Pt denies hx of cardiac problems.       The history is provided by the patient and medical records. No language interpreter was used.    Past Medical History:  Diagnosis Date  . Anxiety   . Gestational diabetes 2008    glyburide this pregnancy  . History of chicken pox   . Kidney stones 2013   13 passed  . Pregnancy induced hypertension    labetolol  . Preterm labor    delivery    Patient Active Problem List   Diagnosis Date Noted  . Chest pain 04/18/2019  . Depression, major, single episode 09/27/2018  . Atypical squamous cells cannot exclude high grade squamous intraepithelial lesion on cytologic smear of cervix (ASC-H) 07/10/2016  . Episodic paroxysmal anxiety disorder 09/06/2015  . Excess, menstruation 09/06/2015  . Kidney stone 09/06/2015  . Insomnia 09/06/2015   . Allergic rhinitis 09/06/2015  . Obesity in pregnancy, antepartum   . Vitamin D insufficiency 11/30/2014  . Chronic fatigue 11/27/2014  . Paroxysmal hypertension 04/28/2013  . Nephrolithiasis 11/29/2012  . Hydronephrosis, right 11/08/2012  . Anxiety with flying   . Obesity, unspecified 12/23/2007  . GERD 12/23/2007  . Acquired cyst of kidney 12/23/2007    Past Surgical History:  Procedure Laterality Date  . CESAREAN SECTION  2006  . WISDOM TOOTH EXTRACTION     x4     OB History    Gravida  9   Para  6   Term  5   Preterm  1   AB  3   Living  6     SAB  3   TAB      Ectopic      Multiple  0   Live Births  6            Home Medications    Prior to Admission medications   Medication Sig Start Date End Date Taking? Authorizing Provider  buPROPion (WELLBUTRIN) 100 MG tablet Take 1 tablet (100 mg total) by mouth 2 (two) times daily. 09/27/18  Yes Reva BoresPratt, Tanya S, MD  labetalol (NORMODYNE) 200 MG tablet Take 1 tablet (200 mg total) by mouth every 12 (twelve) hours. 06/21/18  Yes Anola Gurneyhauvin, Robert, PA  busPIRone (BUSPAR) 7.5 MG tablet Take 1 tablet (7.5 mg total) by  mouth 2 (two) times daily. Patient not taking: Reported on 03/14/2019 09/27/18   Donnamae Jude, MD  hydrochlorothiazide (HYDRODIURIL) 25 MG tablet Take 1 tablet (25 mg total) by mouth daily. Patient not taking: Reported on 03/14/2019 07/13/18   Birdie Sons, MD  phenazopyridine (PYRIDIUM) 200 MG tablet Take 1 tablet (200 mg total) by mouth 3 (three) times daily as needed for pain (urethral spasm). Patient not taking: Reported on 04/18/2019 03/14/19   Donnamae Jude, MD    Family History Family History  Problem Relation Age of Onset  . Schizophrenia Maternal Grandmother   . Atrial fibrillation Mother   . Depression Mother   . Anxiety disorder Mother   . Mental illness Father   . Heart attack Maternal Grandfather   . Atrial fibrillation Paternal Grandfather   . Diabetes Paternal Grandfather    . Heart attack Paternal Grandfather   . Allergies Sister     Social History Social History   Tobacco Use  . Smoking status: Former Research scientist (life sciences)  . Smokeless tobacco: Never Used  Substance Use Topics  . Alcohol use: No    Alcohol/week: 0.0 standard drinks  . Drug use: No     Allergies   Ivp dye [iodinated diagnostic agents], Iodine, and Phenergan fortis [promethazine]   Review of Systems Review of Systems  Constitutional: Positive for fever. Negative for appetite change, diaphoresis, fatigue and unexpected weight change.  HENT: Negative for mouth sores.   Eyes: Negative for visual disturbance.  Respiratory: Negative for cough, chest tightness, shortness of breath and wheezing.   Cardiovascular: Positive for chest pain.  Gastrointestinal: Positive for abdominal pain, diarrhea and nausea. Negative for constipation and vomiting.  Endocrine: Negative for polydipsia, polyphagia and polyuria.  Genitourinary: Negative for dysuria, frequency, hematuria and urgency.  Musculoskeletal: Positive for myalgias. Negative for back pain and neck stiffness.  Skin: Negative for rash.  Allergic/Immunologic: Negative for immunocompromised state.  Neurological: Negative for syncope, light-headedness and headaches.  Hematological: Does not bruise/bleed easily.  Psychiatric/Behavioral: Negative for sleep disturbance. The patient is not nervous/anxious.      Physical Exam Updated Vital Signs BP (!) 144/70 (BP Location: Right Arm)   Pulse (!) 103   Temp 98.5 F (36.9 C) (Oral)   Resp (!) 25   SpO2 98%   Physical Exam Vitals signs and nursing note reviewed.  Constitutional:      General: She is not in acute distress.    Appearance: She is obese. She is not diaphoretic.     Comments: Face is flushed  HENT:     Head: Normocephalic.  Eyes:     General: No scleral icterus.    Conjunctiva/sclera: Conjunctivae normal.  Neck:     Musculoskeletal: Normal range of motion.  Cardiovascular:      Rate and Rhythm: Regular rhythm. Tachycardia present.     Pulses: Normal pulses.          Radial pulses are 2+ on the right side and 2+ on the left side.  Pulmonary:     Effort: No tachypnea, accessory muscle usage, prolonged expiration, respiratory distress or retractions.     Breath sounds: No stridor.     Comments: Equal chest rise. No increased work of breathing. Abdominal:     General: There is no distension.     Palpations: Abdomen is soft.     Tenderness: There is no abdominal tenderness. There is no guarding or rebound.  Musculoskeletal:     Right lower leg: No edema.  Left lower leg: No edema.     Comments: Moves all extremities equally and without difficulty.  Skin:    General: Skin is warm and dry.     Capillary Refill: Capillary refill takes less than 2 seconds.  Neurological:     Mental Status: She is alert.     GCS: GCS eye subscore is 4. GCS verbal subscore is 5. GCS motor subscore is 6.     Comments: Speech is clear and goal oriented.  Psychiatric:        Mood and Affect: Mood normal.      ED Treatments / Results  Labs (all labs ordered are listed, but only abnormal results are displayed) Labs Reviewed  COMPREHENSIVE METABOLIC PANEL - Abnormal; Notable for the following components:      Result Value   Sodium 133 (*)    CO2 21 (*)    Glucose, Bld 112 (*)    Creatinine, Ser 1.05 (*)    Albumin 3.2 (*)    All other components within normal limits  TROPONIN I (HIGH SENSITIVITY) - Abnormal; Notable for the following components:   Troponin I (High Sensitivity) 193 (*)    All other components within normal limits  TROPONIN I (HIGH SENSITIVITY) - Abnormal; Notable for the following components:   Troponin I (High Sensitivity) 97 (*)    All other components within normal limits  SARS CORONAVIRUS 2 (HOSPITAL ORDER, PERFORMED IN [facility])  LACTIC ACID, PLASMA  LACTIC ACID, PLASMA  CBC WITH DIFFERENTIAL/PLATELET  URINALYSIS, ROUTINE W REFLEX  MICROSCOPIC  I-STAT BETA HCG BLOOD, ED ([facility], [facility], [facility] ONLY)    EKG    EKG Interpretation  Date/Time:  Monday April 17 2019 21:34:01 EDT Ventricular Rate:  115 PR Interval:  138 QRS Duration: 86 QT Interval:  312 QTC Calculation: 431 R Axis:   79 Text Interpretation:  Sinus tachycardia Nonspecific ST abnormality Abnormal ECG No significant change since last tracing Confirmed by Gilda CreasePollina, Christopher J [phone]) on 04/18/2019 2:52:29 AM         Radiology Dg Chest 2 View  Result Date: 04/17/2019 CLINICAL DATA:  Fever, tachycardia EXAM: CHEST - 2 VIEW COMPARISON:  None. FINDINGS: The heart size and mediastinal contours are within normal limits. Both lungs are clear. The visualized skeletal structures are unremarkable. IMPRESSION: No acute abnormality of the lungs.  No focal airspace opacity. Electronically Signed   By: Lauralyn PrimesAlex  Bibbey M.D.   On: 04/17/2019 21:54    Procedures Procedures (including critical care time)  Medications Ordered in ED Medications  sodium chloride flush (NS) 0.9 % injection 3 mL (has no administration in time range)  nitroGLYCERIN (NITROSTAT) SL tablet 0.4 mg (0.4 mg Sublingual Given 04/18/19 0330)  sodium chloride 0.9 % bolus 500 mL (0 mLs Intravenous Stopped 04/18/19 0329)  morphine 4 MG/ML injection 4 mg (4 mg Intravenous Given 04/18/19 0251)  aspirin chewable tablet 324 mg (324 mg Oral Given 04/18/19 0322)     Initial Impression / Assessment and Plan / ED Course  I have reviewed the triage vital signs and the nursing notes.  Pertinent labs & imaging results that were available during my care of the patient were reviewed by me and considered in my medical decision making (see chart for details).  Clinical Course as of Apr 18 523  Tue Apr 18, 2019  0257 Discussed with Cardiology who recommends hospitalist admission at this time.  Does recommend ASA and Nitro.    [HM]  [id number] Discussed with Dr.  Rathore who will admit.     [HM]  0517 Slightly elevated -  fluids given as pt endorses significant volume diarrhea.   Creatinine(!): 1.05 [HM]    Clinical Course User Index [HM] Jencarlo Bonadonna, Clent RidgesHannah, PA-C       [name] was evaluated in Emergency Department on 04/18/2019 for the symptoms described in the history of present illness. She was evaluated in the context of the global COVID-19 pandemic, which necessitated consideration that the patient might be at risk for infection with the SARS-CoV-2 virus that causes COVID-19. Institutional protocols and algorithms that pertain to the evaluation of patients at risk for COVID-19 are in a state of rapid change based on information released by regulatory bodies including the CDC and federal and state organizations. These policies and algorithms were followed during the patient's care in the ED.  Presents with symptoms of COVID for 4 days and then development of chest pain this evening.  EKG is nonspecific.  Labs with elevated troponin at 193.  Patient is tachycardic.  Slight elevation in serum creatinine.  I would expect this with some element of dehydration given her severe diarrhea.  Patient is noted to be hypertensive with a history of same.  She has not taken her medications today.  She is tachypneic but without hypoxia.  Abdomen is soft and nontender.  No respiratory distress.  COVID test pending.  5:24 AM COVID test is negative.  No measured fevers here in the emergency department.  Tachycardia is improving.  Mild tachypnea.  Repeat troponin 97.  Patient will need admission for possible ACS vs myocarditis, elevated trop.  ASA and nitro given.  The patient was discussed with Dr. Blinda LeatherwoodPollina who agrees with the treatment plan.    Final Clinical Impressions(s) / ED Diagnoses   Final diagnoses:  Central chest pain  Elevated troponin  Diarrhea of presumed infectious origin    ED Discharge Orders    None       Milta DeitersMuthersbaugh, Danese Dorsainvil, PA-C 04/18/19 [id number]    Gilda CreasePollina, Christopher J, MD 04/18/19  [phone]

## 2019-04-19 ENCOUNTER — Observation Stay (HOSPITAL_COMMUNITY): Payer: Medicaid Other

## 2019-04-19 DIAGNOSIS — R072 Precordial pain: Secondary | ICD-10-CM

## 2019-04-19 DIAGNOSIS — Z7982 Long term (current) use of aspirin: Secondary | ICD-10-CM | POA: Diagnosis not present

## 2019-04-19 DIAGNOSIS — I10 Essential (primary) hypertension: Secondary | ICD-10-CM | POA: Diagnosis not present

## 2019-04-19 DIAGNOSIS — Z8249 Family history of ischemic heart disease and other diseases of the circulatory system: Secondary | ICD-10-CM | POA: Diagnosis not present

## 2019-04-19 DIAGNOSIS — F419 Anxiety disorder, unspecified: Secondary | ICD-10-CM | POA: Diagnosis not present

## 2019-04-19 DIAGNOSIS — R739 Hyperglycemia, unspecified: Secondary | ICD-10-CM | POA: Diagnosis not present

## 2019-04-19 DIAGNOSIS — K529 Noninfective gastroenteritis and colitis, unspecified: Secondary | ICD-10-CM | POA: Diagnosis not present

## 2019-04-19 DIAGNOSIS — Z79899 Other long term (current) drug therapy: Secondary | ICD-10-CM | POA: Diagnosis not present

## 2019-04-19 DIAGNOSIS — Z87891 Personal history of nicotine dependence: Secondary | ICD-10-CM | POA: Diagnosis not present

## 2019-04-19 DIAGNOSIS — E669 Obesity, unspecified: Secondary | ICD-10-CM | POA: Diagnosis not present

## 2019-04-19 DIAGNOSIS — R079 Chest pain, unspecified: Secondary | ICD-10-CM | POA: Diagnosis not present

## 2019-04-19 DIAGNOSIS — Z20828 Contact with and (suspected) exposure to other viral communicable diseases: Secondary | ICD-10-CM | POA: Diagnosis not present

## 2019-04-19 DIAGNOSIS — H9192 Unspecified hearing loss, left ear: Secondary | ICD-10-CM | POA: Diagnosis not present

## 2019-04-19 LAB — RAPID URINE DRUG SCREEN, HOSP PERFORMED
Amphetamines: NOT DETECTED
Barbiturates: NOT DETECTED
Benzodiazepines: NOT DETECTED
Cocaine: NOT DETECTED
Opiates: POSITIVE — AB
Tetrahydrocannabinol: NOT DETECTED

## 2019-04-19 LAB — HIV ANTIBODY (ROUTINE TESTING W REFLEX): HIV Screen 4th Generation wRfx: NONREACTIVE

## 2019-04-19 MED ORDER — DIPHENHYDRAMINE HCL 50 MG/ML IJ SOLN
INTRAMUSCULAR | Status: AC
  Administered 2019-04-19: 50 mg via INTRAVENOUS
  Filled 2019-04-19: qty 1

## 2019-04-19 MED ORDER — IOHEXOL 350 MG/ML SOLN
100.0000 mL | Freq: Once | INTRAVENOUS | Status: AC | PRN
  Administered 2019-04-19: 90 mL via INTRAVENOUS

## 2019-04-19 MED ORDER — NITROGLYCERIN 0.4 MG SL SUBL
SUBLINGUAL_TABLET | SUBLINGUAL | Status: AC
  Administered 2019-04-19: 0.8 mg via SUBLINGUAL
  Filled 2019-04-19: qty 2

## 2019-04-19 MED ORDER — FAMOTIDINE 20 MG PO TABS: 20.0000 mg | ORAL_TABLET | Freq: Every day | ORAL | Status: DC

## 2019-04-19 MED ORDER — METOPROLOL TARTRATE 5 MG/5ML IV SOLN
INTRAVENOUS | Status: AC
  Administered 2019-04-19: 5 mg via INTRAVENOUS
  Filled 2019-04-19: qty 10

## 2019-04-19 MED ORDER — SODIUM CHLORIDE 0.9 % IV BOLUS
500.0000 mL | Freq: Once | INTRAVENOUS | Status: AC
  Administered 2019-04-19: 500 mL via INTRAVENOUS

## 2019-04-19 MED ORDER — FAMOTIDINE 20 MG PO TABS: 20.0000 mg | ORAL_TABLET | Freq: Two times a day (BID) | ORAL | Status: DC | PRN

## 2019-04-19 MED ORDER — DILTIAZEM HCL 25 MG/5ML IV SOLN
10.0000 mg | INTRAVENOUS | Status: AC
  Administered 2019-04-19: 10 mg via INTRAVENOUS
  Filled 2019-04-19: qty 5

## 2019-04-19 MED ORDER — DIPHENHYDRAMINE HCL 50 MG/ML IJ SOLN
50.0000 mg | Freq: Once | INTRAMUSCULAR | Status: AC
  Administered 2019-04-19: 50 mg via INTRAVENOUS

## 2019-04-19 MED ORDER — METOPROLOL TARTRATE 5 MG/5ML IV SOLN
5.0000 mg | INTRAVENOUS | Status: DC | PRN
  Administered 2019-04-19 (×4): 5 mg via INTRAVENOUS

## 2019-04-19 NOTE — Progress Notes (Signed)
Pt. Has concerns about abdominal irritation. States she would like to speak with MD about stomach issue. RN to make oncoming RN aware.

## 2019-04-19 NOTE — Discharge Summary (Signed)
Physician Discharge Summary  [name] AOZ:[id number] DOB: [birth year] DOA: 04/17/2019  PCP: Birdie Sons, MD  Admit date: 04/17/2019 Discharge date: 04/19/2019  Time spent: 45 minutes  Recommendations for Outpatient Follow-up:  1. Follow up with PCP 1-2 weeks for evaluation of symptoms   Discharge Diagnoses:  Principal Problem:   Chest pain Active Problems:   Obesity (BMI 35.0-39.9 without comorbidity)   Paroxysmal hypertension   Gastroenteritis   Hyperglycemia   Discharge Condition: stabe  Diet recommendation: heart healthy  Filed Weights   04/18/19 0759 04/19/19 0513  Weight: 135.5 kg 135.9 kg    History of present illness:  [name] is a 38 y.o. female with medical history significant of anxiety; obesity (BMI 39); and pregnancy-associated HTN/GDM presented 6/30 with chest pain.  Thursday, she woke up feeling bad.  She had a feeling in her mid-abdomen, maybe indigestion but no better with prilosec.  She developed diarrhea, fever to 102, shaking chills.  The pain was "like an uncomfortable, spreading, gnawing feeling."  She has been unable to eat since Thursday and has had ongoing watery diarrhea since then.  She has had very little energy.  She felt "like the life had been sucked" from her body.  Crampy pains improved with diarrhea.  Yesterday, she felt extremely dehydrated.  Last night about 6 pm she had severe pain across her diaphragm and throughout her chest, behind her breasts.  It was extremely painful, 8-9/10 and lasted about 20 minutes and started to slowly subside.  She felt "out of control of my body."  The pain recurred, 7/10, and she decided she needed evaluation.  The pain would not resolve but waxed and waned.  She currently feels "about the same" - she got a couple of hours of sleep but feels now like the pain might be starting to come back, 1-2/10.  Last stool was in the waiting room.  Abdominal cramping has resolved.  Fever is better - it was continuous  from Thursday to Monday.   Hospital Course:   Chest pain Patient with substernal chest pain/pressure overnight, following gastroenteritis; relieved with NTG. CXR unremarkable. HS troponin significantly elevated, EKG not indicative of acute ischemia. HEART score is 3, indicating that the patient has a low risk score; however, her HS troponin is elevated and so she required further evaluation. Evaluated by cardiology who opine atypical pain likely related to gi. Echo with EF 60%, cardiac CT no evidence of CAD. Recommended PPI  Gastroenteritis afebrile during hospitalization. No loose stool. Tolerating clear liquid. Reports feeling better with PPI  HTN Fair control. Takes labetolol monotherapy at home  Obesity -Needs to be encouraged to diet/exercise for weight loss -Consider bariatric medicine vs. Surgery evaluation  Hyperglycemia A1c 5.5 Glucose mildly elevated on admission.   Procedures:  echo  Consultations:  cooper  Discharge Exam: Vitals:   04/19/19 1147 04/19/19 1211  BP: 102/64 127/66  Pulse: 80 75  Resp:  20  Temp:  98.6 F (37 C)  SpO2:  97%    General: awake alert oriented Cardiovascular: rrr no mgr no LE  Respiratory: normal effort BS clear bilaterally no wheeze  Discharge Instructions   Discharge Instructions    Call MD for:  severe uncontrolled pain   Complete by: As directed    Diet - low sodium heart healthy   Complete by: As directed    Discharge instructions   Complete by: As directed    Take meds as directed Follow up with PCP 1-2  weeks for evaluation of symptoms   Increase activity slowly   Complete by: As directed      Allergies as of 04/19/2019      Reactions   Ivp Dye [iodinated Diagnostic Agents] Anaphylaxis   Iodine    Phenergan Fortis [promethazine] Itching   Itching/pins and needles for hours.      Medication List    TAKE these medications   buPROPion 100 MG tablet Commonly known as: Wellbutrin Take 1 tablet (100 mg  total) by mouth 2 (two) times daily.   famotidine 20 MG tablet Commonly known as: PEPCID Take 1 tablet (20 mg total) by mouth 3 times/day as needed-between meals & bedtime for heartburn or indigestion.   labetalol 200 MG tablet Commonly known as: NORMODYNE Take 1 tablet (200 mg total) by mouth every 12 (twelve) hours.      Allergies  Allergen Reactions  . Ivp Dye [Iodinated Diagnostic Agents] Anaphylaxis  . Iodine   . Phenergan Fortis [Promethazine] Itching    Itching/pins and needles for hours.      The results of significant diagnostics from this hospitalization (including imaging, microbiology, ancillary and laboratory) are listed below for reference.    Significant Diagnostic Studies: Dg Chest 2 View  Result Date: 04/17/2019 CLINICAL DATA:  Fever, tachycardia EXAM: CHEST - 2 VIEW COMPARISON:  None. FINDINGS: The heart size and mediastinal contours are within normal limits. Both lungs are clear. The visualized skeletal structures are unremarkable. IMPRESSION: No acute abnormality of the lungs.  No focal airspace opacity. Electronically Signed   By: Lauralyn PrimesAlex  Bibbey M.D.   On: 04/17/2019 21:54   Ct Coronary Morph W/cta Cor W/score W/ca W/cm &/or Wo/cm  Addendum Date: 04/19/2019   ADDENDUM REPORT: 04/19/2019 14:08 HISTORY: Chest pain, elevated troponin. EXAM: Cardiac/Coronary  CT TECHNIQUE: The patient was scanned on a [employer] Force scanner. PROTOCOL: A 120 kV prospective scan was triggered in the descending thoracic aorta at 111 HU's. Axial non-contrast 3 mm slices were carried out through the heart. The data set was analyzed on a dedicated work station and scored using the Agatson method. Gantry rotation speed was 250 msecs and collimation was .6 mm. Beta blockade and 0.8 mg of sl NTG was given. The 3D data set was reconstructed in 5% intervals of the of the R-R cycle. Diastolic phases were analyzed on a dedicated work station using MPR, MIP and VRT modes. The patient received 90mL  OMNIPAQUE IOHEXOL 350 MG/ML SOLN of contrast. FINDINGS: Coronary calcium score: The patient's coronary artery calcium score is 0 Elevated HR and suboptimal arterial contrast opacification limits the diagnostic quality of this exam. Coronary arteries: Normal coronary origins.  Right dominance. Right Coronary Artery: No detectable plaque or stenosis. (Examined [place] at 90% of RR interval) Left Main Coronary Artery: No detectable plaque or stenosis. (Examined [place] at 70% of RR interval) Left Anterior Descending Coronary Artery: No detectable plaque or stenosis. (Examined [place] at 75-80% of RR interval). Patent diagonal branch. Left Circumflex Artery: No detectable plaque or stenosis. (Examined [place] at 95% of RR interval). Patent OM1 without disease. Aorta: Normal size, 28 mm at the mid ascending aorta (level of the PA bifurcation) measured double oblique. No calcifications. No dissection. Aortic Valve: No calcifications. Other findings: Normal pulmonary vein drainage into the left atrium. Normal left atrial appendage without a thrombus. Mild dilation of the pulmonary artery (31 mm). IMPRESSION: 1. No evidence of CAD, CADRADS = 0. 2. Coronary calcium score of 0. This was 0 percentile for age and  sex matched control. 3. Normal coronary origin with right dominance. Electronically Signed   By: Weston BrassGayatri  Acharya   On: 04/19/2019 14:08   Result Date: 04/19/2019 EXAM: OVER-READ INTERPRETATION  CT CHEST The following report is an over-read performed by radiologist Dr. Genevive BiStewart Edmunds of [facility] on 04/19/2019. This over-read does not include interpretation of cardiac or coronary anatomy or pathology. The coronary CTA interpretation by the cardiologist is attached. COMPARISON:  None. FINDINGS: Limited view of the lung parenchyma demonstrates no suspicious nodularity. Airways are normal. Limited view of the mediastinum demonstrates no adenopathy. Esophagus normal. Limited view of the upper abdomen unremarkable.  Limited view of the skeleton and chest wall is unremarkable. IMPRESSION: No significant extracardiac findings Electronically Signed: By: Genevive BiStewart  Edmunds M.D. On: 04/19/2019 11:59    Microbiology: Recent Results (from the past 240 hour(s))  SARS Coronavirus 2 (CEPHEID- Performed in [facility] lab), Hosp Order     Status: None   Collection Time: 04/18/19  2:45 AM   Specimen: Nasopharyngeal Swab  Result Value Ref Range Status   SARS Coronavirus 2 NEGATIVE NEGATIVE Final    Comment: (NOTE) If result is NEGATIVE SARS-CoV-2 target nucleic acids are NOT DETECTED. The SARS-CoV-2 RNA is generally detectable in upper and lower  respiratory specimens during the acute phase of infection. The lowest  concentration of SARS-CoV-2 viral copies this assay can detect is 250  copies / mL. A negative result does not preclude SARS-CoV-2 infection  and should not be used as the sole basis for treatment or other  patient management decisions.  A negative result may occur with  improper specimen collection / handling, submission of specimen other  than nasopharyngeal swab, presence of viral mutation(s) within the  areas targeted by this assay, and inadequate number of viral copies  (<250 copies / mL). A negative result must be combined with clinical  observations, patient history, and epidemiological information. If result is POSITIVE SARS-CoV-2 target nucleic acids are DETECTED. The SARS-CoV-2 RNA is generally detectable in upper and lower  respiratory specimens dur ing the acute phase of infection.  Positive  results are indicative of active infection with SARS-CoV-2.  Clinical  correlation with patient history and other diagnostic information is  necessary to determine patient infection status.  Positive results do  not rule out bacterial infection or co-infection with other viruses. If result is PRESUMPTIVE POSTIVE SARS-CoV-2 nucleic acids MAY BE PRESENT.   A presumptive positive result was  obtained on the submitted specimen  and confirmed on repeat testing.  While 2019 novel coronavirus  (SARS-CoV-2) nucleic acids may be present in the submitted sample  additional confirmatory testing may be necessary for epidemiological  and / or clinical management purposes  to differentiate between  SARS-CoV-2 and other Sarbecovirus currently known to infect humans.  If clinically indicated additional testing with an alternate test  methodology [phone]([id number]) is advised. The SARS-CoV-2 RNA is generally  detectable in upper and lower respiratory sp ecimens during the acute  phase of infection. The expected result is Negative. Fact Sheet for Patients:  BoilerBrush.com.cyhttps://www.fda.gov/media/136312/download Fact Sheet for Healthcare Providers: https://pope.com/https://www.fda.gov/media/136313/download This test is not yet approved or cleared by the [place] FDA and has been authorized for detection and/or diagnosis of SARS-CoV-2 by FDA under an Emergency Use Authorization (EUA).  This EUA will remain in effect (meaning this test can be used) for the duration of the COVID-19 declaration under Section 564(b)(1) of the Act, 21 U.S.C. section 360bbb-3(b)(1), unless the authorization is terminated or  revoked sooner. Performed at [facility], [address] [place], [place] [address]      Labs: Basic Metabolic Panel: Recent Labs  Lab 04/17/19 2122  NA 133*  K 3.6  CL 99  CO2 21*  GLUCOSE 112*  BUN 17  CREATININE 1.05*  CALCIUM 8.9   Liver Function Tests: Recent Labs  Lab 04/17/19 2122  AST 23  ALT 24  ALKPHOS 42  BILITOT 0.5  PROT 7.0  ALBUMIN 3.2*   No results for input(s): LIPASE, AMYLASE in the last 168 hours. No results for input(s): AMMONIA in the last 168 hours. CBC: Recent Labs  Lab 04/17/19 2122  WBC 7.9  NEUTROABS 5.1  HGB 13.2  HCT 41.3  MCV 81.5  PLT 269   Cardiac Enzymes: No results for input(s): CKTOTAL, CKMB, CKMBINDEX, TROPONINI in the last 168 hours. BNP: BNP  (last 3 results) No results for input(s): BNP in the last 8760 hours.  ProBNP (last 3 results) No results for input(s): PROBNP in the last 8760 hours.  CBG: No results for input(s): GLUCAP in the last 168 hours.     SignedGwenyth Bender:  BLACK,KAREN M NP [facility] 04/19/2019, 3:14 PM

## 2019-04-19 NOTE — Progress Notes (Signed)
Progress Note  Patient Name: [name] Date of Encounter: 04/19/2019  Primary Cardiologist: No primary care provider on file.   Subjective   CT performed today for CAD. patient pretreated for contrast allergy.   Inpatient Medications    Scheduled Meds:  aspirin EC  81 mg Oral Daily   buPROPion  100 mg Oral BID   enoxaparin (LOVENOX) injection  40 mg Subcutaneous Q24H   famotidine  20 mg Oral QHS   labetalol  200 mg Oral Q12H   Continuous Infusions:  PRN Meds: acetaminophen, metoprolol tartrate, ondansetron (ZOFRAN) IV   Vital Signs    Vitals:   04/19/19 1129 04/19/19 1130 04/19/19 1147 04/19/19 1211  BP:   102/64 127/66  Pulse: 81 85 80 75  Resp:    20  Temp:    98.6 F (37 C)  TempSrc:    Oral  SpO2:    97%  Weight:      Height:        Intake/Output Summary (Last 24 hours) at 04/19/2019 1558 Last data filed at 04/19/2019 1100 Gross per 24 hour  Intake 2295.48 ml  Output 950 ml  Net 1345.48 ml   Filed Weights   04/18/19 0759 04/19/19 0513  Weight: 135.5 kg 135.9 kg    Telemetry    NSR  - Personally Reviewed  ECG    NSR, nonspecific T wave abnormality- Personally Reviewed  Physical Exam   GEN: No acute distress.   Neck: No JVD Cardiac: regular rhythm, normal rate, no murmurs, rubs, or gallops.  Respiratory: Clear to auscultation bilaterally. GI: Soft, nontender, non-distended  MS: No edema; No deformity. Neuro:  Nonfocal  Psych: Normal affect   Labs    Chemistry Recent Labs  Lab 04/17/19 2122  NA 133*  K 3.6  CL 99  CO2 21*  GLUCOSE 112*  BUN 17  CREATININE 1.05*  CALCIUM 8.9  PROT 7.0  ALBUMIN 3.2*  AST 23  ALT 24  ALKPHOS 42  BILITOT 0.5  GFRNONAA >60  GFRAA >60  ANIONGAP 13     Hematology Recent Labs  Lab 04/17/19 2122  WBC 7.9  RBC 5.07  HGB 13.2  HCT 41.3  MCV 81.5  MCH 26.0  MCHC 32.0  RDW 14.3  PLT 269    Cardiac EnzymesNo results for input(s): TROPONINI in the last 168 hours. No results for  input(s): TROPIPOC in the last 168 hours.   BNPNo results for input(s): BNP, PROBNP in the last 168 hours.   DDimer No results for input(s): DDIMER in the last 168 hours.   Radiology    Dg Chest 2 View  Result Date: 04/17/2019 CLINICAL DATA:  Fever, tachycardia EXAM: CHEST - 2 VIEW COMPARISON:  None. FINDINGS: The heart size and mediastinal contours are within normal limits. Both lungs are clear. The visualized skeletal structures are unremarkable. IMPRESSION: No acute abnormality of the lungs.  No focal airspace opacity. Electronically Signed   By: Eddie Candle M.D.   On: 04/17/2019 21:54   Ct Coronary Morph W/cta Cor W/score W/ca W/cm &/or Wo/cm  Addendum Date: 04/19/2019   ADDENDUM REPORT: 04/19/2019 14:08 HISTORY: Chest pain, elevated troponin. EXAM: Cardiac/Coronary  CT TECHNIQUE: The patient was scanned on a [employer]. PROTOCOL: A 120 kV prospective scan was triggered in the descending thoracic aorta at 111 HU's. Axial non-contrast 3 mm slices were carried out through the heart. The data set was analyzed on a dedicated work station and scored using the [employer]  method. Gantry rotation speed was 250 msecs and collimation was .6 mm. Beta blockade and 0.8 mg of sl NTG was given. The 3D data set was reconstructed in 5% intervals of the of the R-R cycle. Diastolic phases were analyzed on a dedicated work station using MPR, MIP and VRT modes. The patient received 90mL OMNIPAQUE IOHEXOL 350 MG/ML SOLN of contrast. FINDINGS: Coronary calcium score: The patient's coronary artery calcium score is 0 Elevated HR and suboptimal arterial contrast opacification limits the diagnostic quality of this exam. Coronary arteries: Normal coronary origins.  Right dominance. Right Coronary Artery: No detectable plaque or stenosis. (Examined Register at 90% of RR interval) Left Main Coronary Artery: No detectable plaque or stenosis. (Examined [place]ia at 70% of RR interval) Left Anterior Descending Coronary Artery: No  detectable plaque or stenosis. (Examined Fontaine at 75-80% of RR interval). Patent diagonal branch. Left Circumflex Artery: No detectable plaque or stenosis. (Examined Bales at 95% of RR interval). Patent OM1 without disease. Aorta: Normal size, 28 mm at the mid ascending aorta (level of the PA bifurcation) measured double oblique. No calcifications. No dissection. Aortic Valve: No calcifications. Other findings: Normal pulmonary vein drainage into the left atrium. Normal left atrial appendage without a thrombus. Mild dilation of the pulmonary artery (31 mm). IMPRESSION: 1. No evidence of CAD, CADRADS = 0. 2. Coronary calcium score of 0. This was 0 percentile for age and sex matched control. 3. Normal coronary origin with right dominance. Electronically Signed   By: Weston BrassGayatri  Day Greb   On: 04/19/2019 14:08   Result Date: 04/19/2019 EXAM: OVER-READ INTERPRETATION  CT CHEST The following report is an over-read performed by radiologist Dr. Genevive [facility]Stewart Edmunds [facility] on 04/19/2019. This over-read does not include interpretation of cardiac or coronary anatomy or pathology. The coronary CTA interpretation by the cardiologist is attached. COMPARISON:  None. FINDINGS: Limited view of the lung parenchyma demonstrates no suspicious nodularity. Airways are normal. Limited view of the mediastinum demonstrates no adenopathy. Esophagus normal. Limited view of the upper abdomen unremarkable. Limited view of the skeleton and chest wall is unremarkable. IMPRESSION: No significant extracardiac findings Electronically Signed: By: Genevive BiStewart  Edmunds M.D. On: 04/19/2019 11:59    Cardiac Studies   CT coronary angiogram - no CAD  Patient Profile     [name] is a 38 y.o. female with a hx of kidney stone and HTN  who is being seen today for the evaluation of chest pain and elevated troponin.   Assessment & Plan   Principal Problem:   Chest pain Active Problems:   Obesity (BMI 35.0-39.9 without comorbidity)    Paroxysmal hypertension   Gastroenteritis   Hyperglycemia   The patient had a CT coronary angiogram today.  Unfortunately heart rate was unable to be optimized for a ideal quality study, however there is no apparent proximal coronary artery disease, and overall coronaries appear normal.  Mild dilatation of the main pulmonary artery.  She had an echocardiogram which does not show changes to the right ventricle and no apparent elevation of right ventricular systolic pressure, though it was not formally calculated.  I will plan to see the patient in follow-up when she has recovered from her gastroenteritis to follow-up risk factors including hypertension and obesity and any continued chest pain.  This will be arranged for July 24.  No change to medical therapy required at this time.     [facility] will sign off.   Medication Recommendations:  No change Other recommendations (labs,  testing, etc): none Follow up as an outpatient:  F/u July 24 with Dr. Jacques NavyAcharya  For questions or updates, please contact [facility] Please consult [email or web address] for contact info under        Signed, Parke PoissonGayatri A Lenny Bouchillon, MD  04/19/2019, 3:58 PM

## 2019-04-19 NOTE — Progress Notes (Signed)
Pt completed Cardiac CTA; pt able to stand from CT table to wheelchair, felt dizzy, was assisted to sitting position where she stated her head felt "heavy"; 250cc NS bolus started. 3E RN aware

## 2019-05-01 ENCOUNTER — Other Ambulatory Visit: Payer: Self-pay | Admitting: Family Medicine

## 2019-05-01 ENCOUNTER — Telehealth: Payer: Self-pay | Admitting: Family Medicine

## 2019-05-01 DIAGNOSIS — I10 Essential (primary) hypertension: Secondary | ICD-10-CM

## 2019-05-01 DIAGNOSIS — F410 Panic disorder [episodic paroxysmal anxiety] without agoraphobia: Secondary | ICD-10-CM

## 2019-05-01 MED ORDER — LABETALOL HCL 200 MG PO TABS: 200.0000 mg | ORAL_TABLET | Freq: Two times a day (BID) | ORAL | 5 refills | Status: DC

## 2019-05-01 NOTE — Telephone Encounter (Signed)
[facility] faxed refill request for the following medications:  labetalol (NORMODYNE) 200 MG tablet   Please advise.

## 2019-05-11 ENCOUNTER — Telehealth: Payer: Self-pay | Admitting: Internal Medicine

## 2019-05-11 NOTE — Telephone Encounter (Signed)

## 2019-05-12 ENCOUNTER — Other Ambulatory Visit: Payer: Self-pay

## 2019-05-12 ENCOUNTER — Encounter: Payer: Self-pay | Admitting: Internal Medicine

## 2019-05-12 ENCOUNTER — Ambulatory Visit (INDEPENDENT_AMBULATORY_CARE_PROVIDER_SITE_OTHER): Payer: Medicaid Other | Admitting: Internal Medicine

## 2019-05-12 VITALS — BP 128/94 | HR 70 | Temp 98.6°F | Ht 73.0 in | Wt 302.0 lb

## 2019-05-12 DIAGNOSIS — R0789 Other chest pain: Secondary | ICD-10-CM | POA: Diagnosis not present

## 2019-05-12 DIAGNOSIS — R079 Chest pain, unspecified: Secondary | ICD-10-CM

## 2019-05-12 MED ORDER — NITROGLYCERIN 0.4 MG SL SUBL: 0.4000 mg | SUBLINGUAL_TABLET | SUBLINGUAL | 3 refills | Status: DC | PRN

## 2019-05-12 NOTE — Progress Notes (Signed)
Cardiology Office Note:    Date:  05/12/2019   ID:  [name], DOB [birth year], MRN [id number]  PCP:  Malva LimesFisher, Donald E, MD  Cardiologist:  No primary care provider on file.  Electrophysiologist:  None   Referring MD: Malva LimesFisher, Donald E, MD   Chief Complaint: Chest pain  History of Present Illness:    Jackie BrucknerJennifer L Brown is a 38 y.o. female with a hx of kidney stone and HTN. I am seeing her today for post hospital follow up of chest pain.   She was seen in hospital for chest pain during an episode of presumed gastroenteritis with fever. Troponin was elevated and CCTA was pursued. COVID test was negative. No CAD noted on CCTA, though study was impacted by poor contrast opacification and elevated heart rate in the setting of acute infection.   She feels far better now but continues to experience chest pain. It does not occur with activity but does occur at rest and primarily with stress and anxiety. 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 She had chest pain 2 nights ago and is concerned about this.   The patient denies dyspnea at rest or with exertion, palpitations, PND, orthopnea, or leg swelling. Denies syncope or presyncope. Denies dizziness or lightheadedness. Denies snoring and has not been evaluated for sleep apnea.  FHx: GF - MIs and afib Mom afib No early MI No scd   Past Medical History:  Diagnosis Date   Anginal pain (HCC)    Anxiety    Deaf, left    Gestational diabetes 2008    glyburide this pregnancy   History of chicken pox    Kidney stones 2013   13 passed   Pregnancy induced hypertension    labetolol   Preterm labor    delivery    Past Surgical History:  Procedure Laterality Date   CESAREAN SECTION  2006   WISDOM TOOTH EXTRACTION     x4    Current Medications: Current Meds  Medication Sig   buPROPion (WELLBUTRIN) 100 MG tablet TAKE 1 TABLET BY MOUTH TWICE DAILY   labetalol  (NORMODYNE) 200 MG tablet Take 1 tablet (200 mg total) by mouth every 12 (twelve) hours.     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  Social Needs   Financial resource strain: Not on file   Food insecurity    Worry: Not on file    Inability: Not on file   Transportation needs    Medical: Not on file    Non-medical: Not on file  Tobacco Use   Smoking status: Never Smoker   Smokeless tobacco: Never Used  Substance and Sexual Activity   Alcohol use: No    Alcohol/week: 0.0 standard drinks   Drug use: No   Sexual activity: Yes    Partners: Male    Birth control/protection: None  Lifestyle   Physical activity    Days per week: Not on file    Minutes per session: Not on file   Stress: Not on file  Relationships   Social Musicianconnections    Talks on phone: Not on file    Gets together: Not on file    Attends religious service: Not on file    Active member of club or organization: Not on file    Attends meetings of clubs or organizations: Not on file    Relationship status: Not on file  Other Topics Concern   Not on file  Social History Narrative   Not on file     Family History: The patient's family history includes Allergies in her sister; Anxiety disorder in her mother; Atrial fibrillation in her mother and paternal grandfather; Depression in her mother; Diabetes in her paternal grandfather; Heart attack in her maternal grandfather and paternal grandfather; Mental illness in her father; Schizophrenia in her maternal grandmother.  ROS:   Please see the history of present illness.    All other systems reviewed and are  negative.  EKGs/Labs/Other Studies Reviewed:    The following studies were reviewed today:  EKG:  NSR, nonspecific T wave abnl  Recent Labs: 04/17/2019: ALT 24; BUN 17; Creatinine, Ser 1.05; Hemoglobin 13.2; Platelets 269; Potassium 3.6; Sodium 133 04/18/2019: TSH 1.791  Recent Lipid Panel    Component Value Date/Time   CHOL 126 04/18/2019 0937   TRIG 80 04/18/2019 0937   HDL 38 (L) 04/18/2019 0937   CHOLHDL 3.3 04/18/2019 0937   VLDL 16 04/18/2019 0937   LDLCALC 72 04/18/2019 0937    Physical Exam:    VS:  BP (!) 128/94    Pulse 70    Temp 98.6 F (37 C)    Ht 6\' 1"  (1.854 m)    Wt (!) 302 lb (137 kg)    BMI 39.84 kg/m     Wt Readings from Last 5 Encounters:  05/12/19 (!) 302 lb (137 kg)  04/19/19 299 lb 8 oz (135.9 kg)  03/14/19 (!) 313 lb 3.2 oz (142.1 kg)  11/03/18 (!) 311 lb (141.1 kg)  09/27/18 (!) 308 lb (139.7 kg)     Constitutional: No acute distress Eyes: sclera non-icteric, normal conjunctiva and lids ENMT: normal dentition, moist mucous membranes Cardiovascular: regular rhythm, normal rate, no murmurs. S1 and S2 normal. Radial pulses normal bilaterally. No jugular venous distention.  Respiratory: clear to auscultation bilaterally GI : normal bowel sounds, soft and nontender. No distention.   MSK: extremities warm, well perfused. No edema.  NEURO: grossly nonfocal exam, moves all extremities. PSYCH: alert and oriented x 3, normal mood and affect.   ASSESSMENT:    1. Chest pain of uncertain etiology    PLAN:    We have discussed that her chest pain could be from microvascular dysfunction and small vessel disease. No definite epicardial coronary disease noted on CT.   She felt that nitro relieved her chest pain in the ED, and we will try sublingual nitro for episodes of chest pain at this time. If that helps we can discuss low dose Imdur going forward. If it is not helpful, we can trial a calcium channel blocker or other therapy for  endothelial/microvascular dysfunction going forward.   TIME SPENT WITH PATIENT: 25 minutes of direct patient care. More than 50% of that time was spent on coordination of care and counseling regarding management of chest pain and possible etiologies. Weston Brass.  Danial Hlavac, MD [place]   [facility]   Medication Adjustments/Labs and Tests Ordered: Current medicines are reviewed at length with the patient today.  Concerns regarding medicines are outlined above.  Orders Placed This Encounter  Procedures   EKG 12-Lead   Meds ordered this encounter  Medications   nitroGLYCERIN (NITROSTAT) 0.4 MG SL tablet    Sig: Place 1 tablet (0.4 mg total) under the tongue every 5 (five) minutes as needed for chest pain.    Dispense:  90 tablet    Refill:  3    Patient Instructions  Medication Instructions:   -START NITROGLYCERIN SUBLINGUAL AS NEEDED FOR CHEST DISCOMFORT  If you need a refill on your cardiac medications before your next appointment, please call your pharmacy.   Lab work:  NOT NEEDED  Testing/Procedures: NOT NEEDED  Follow-Up: At [facility], you and your health needs are our priority.  As part of our continuing mission to provide you with exceptional heart care, we have created designated Provider Care Teams.  These Care Teams include your primary Cardiologist (physician) and Advanced Practice Providers (APPs -  Physician Assistants and Nurse Practitioners) who all work together to provide you with the care you need, when you need it.  You will need a follow up appointment in 6 months JAN 2021.  Please call our office 2 months in advance to schedule this appointment.  You may see Parke PoissonGayatri A Arnice Vanepps, MD*or one of the following Advanced Practice Providers on your designated Care Team:    Theodore DemarkRhonda Barrett, PA-C  Joni ReiningKathryn Lawrence, DNP, ANP  Any Other Special Instructions Will Be Listed Below (If Applicable).    Nitroglycerin sublingual tablets What is this  medicine? NITROGLYCERIN (nye troe GLI ser in) is a type of vasodilator. It relaxes blood vessels, increasing the blood and oxygen supply to your heart. This medicine is used to relieve chest pain caused by angina. It is also used to prevent chest pain before activities like climbing stairs, going outdoors in cold weather, or sexual activity. This medicine may be used for other purposes; ask your health care provider or pharmacist if you have questions. COMMON BRAND NAME(S): Nitroquick, Nitrostat, Nitrotab What should I tell my health care provider before I take this medicine? They need to know if you have any of these conditions:  anemia  head injury, recent stroke, or bleeding in the brain  liver disease  previous heart attack  an unusual or allergic reaction to nitroglycerin, other medicines, foods, dyes, or preservatives  pregnant or trying to get pregnant  breast-feeding How should I use this medicine? Take this medicine by mouth as needed. At the first sign of an angina attack (chest pain or tightness) place one tablet under your tongue. You can also take this medicine 5 to 10 minutes before an event likely to produce chest pain. Follow the directions on the prescription label. Let the tablet dissolve under the tongue. Do not swallow whole. Replace the dose if you accidentally swallow it. It will help if your mouth is not dry. Saliva around the tablet will help it to dissolve more quickly. Do not eat or drink, smoke or chew tobacco while a tablet is dissolving. If you are not better within 5 minutes after taking ONE dose of nitroglycerin, call 9-1-1 immediately to seek emergency medical care. Do not take more than 3 nitroglycerin tablets over 15 minutes. If you take this medicine often to relieve symptoms of angina, your doctor or health care professional may provide you with different instructions to manage your symptoms. If symptoms do not go away after following these instructions, it  is important to call 9-1-1 immediately. Do not take more than 3 nitroglycerin tablets  over 15 minutes. Talk to your pediatrician regarding the use of this medicine in children. Special care may be needed. Overdosage: If you think you have taken too much of this medicine contact a poison control center or emergency room at once. NOTE: This medicine is only for you. Do not share this medicine with others. What if I miss a dose? This does not apply. This medicine is only used as needed. What may interact with this medicine? Do not take this medicine with any of the following medications:  certain migraine medicines like ergotamine and dihydroergotamine (DHE)  medicines used to treat erectile dysfunction like sildenafil, tadalafil, and vardenafil  riociguat This medicine may also interact with the following medications:  alteplase  aspirin  heparin  medicines for high blood pressure  medicines for mental depression  other medicines used to treat angina  phenothiazines like chlorpromazine, mesoridazine, prochlorperazine, thioridazine This list may not describe all possible interactions. Give your health care provider a list of all the medicines, herbs, non-prescription drugs, or dietary supplements you use. Also tell them if you smoke, drink alcohol, or use illegal drugs. Some items may interact with your medicine. What should I watch for while using this medicine? Tell your doctor or health care professional if you feel your medicine is no longer working. Keep this medicine with you at all times. Sit or lie down when you take your medicine to prevent falling if you feel dizzy or faint after using it. Try to remain calm. This will help you to feel better faster. If you feel dizzy, take several deep breaths and lie down with your feet propped up, or bend forward with your head resting between your knees. You may get drowsy or dizzy. Do not drive, use machinery, or do anything that needs  mental alertness until you know how this drug affects you. Do not stand or sit up quickly, especially if you are an older patient. This reduces the risk of dizzy or fainting spells. Alcohol can make you more drowsy and dizzy. Avoid alcoholic drinks. Do not treat yourself for coughs, colds, or pain while you are taking this medicine without asking your doctor or health care professional for advice. Some ingredients may increase your blood pressure. What side effects may I notice from receiving this medicine? Side effects that you should report to your doctor or health care professional as soon as possible:  blurred vision  dry mouth  skin rash  sweating  the feeling of extreme pressure in the head  unusually weak or tired Side effects that usually do not require medical attention (report to your doctor or health care professional if they continue or are bothersome):  flushing of the face or neck  headache  irregular heartbeat, palpitations  nausea, vomiting This list may not describe all possible side effects. Call your doctor for medical advice about side effects. You may report side effects to FDA at 1-800-FDA-1088. Where should I keep my medicine? Keep out of the reach of children. Store at room temperature between 20 and 25 degrees C (68 and 77 degrees F). Store in [employer] of Staff. Protect from light and moisture. Keep tightly closed. Throw away any unused medicine after the expiration date. NOTE: This sheet is a summary. It may not cover all possible information. If you have questions about this medicine, talk to your doctor, pharmacist, or health care provider.  2020 Elsevier/Gold Standard (2013-08-03 17:57:36)

## 2019-05-12 NOTE — Patient Instructions (Addendum)
Medication Instructions:   -START NITROGLYCERIN SUBLINGUAL AS NEEDED FOR CHEST DISCOMFORT  If you need a refill on your cardiac medications before your next appointment, please call your pharmacy.   Lab work:  NOT NEEDED  Testing/Procedures: NOT NEEDED  Follow-Up: At [facility], you and your health needs are our priority.  As part of our continuing mission to provide you with exceptional heart care, we have created designated Provider Care Teams.  These Care Teams include your primary Cardiologist (physician) and Advanced Practice Providers (APPs -  Physician Assistants and Nurse Practitioners) who all work together to provide you with the care you need, when you need it. . You will need a follow up appointment in 6 months JAN 2021.  Please call our office 2 months in advance to schedule this appointment.  You may see Parke PoissonGayatri A Acharya, MD*or one of the following Advanced Practice Providers on your designated Care Team:   . Theodore DemarkRhonda Barrett, PA-C . Joni ReiningKathryn Lawrence, DNP, ANP  Any Other Special Instructions Will Be Listed Below (If Applicable).    Nitroglycerin sublingual tablets What is this medicine? NITROGLYCERIN (nye troe GLI ser in) is a type of vasodilator. It relaxes blood vessels, increasing the blood and oxygen supply to your heart. This medicine is used to relieve chest pain caused by angina. It is also used to prevent chest pain before activities like climbing stairs, going outdoors in cold weather, or sexual activity. This medicine may be used for other purposes; ask your health care provider or pharmacist if you have questions. COMMON BRAND NAME(S): Nitroquick, Nitrostat, Nitrotab What should I tell my health care provider before I take this medicine? They need to know if you have any of these conditions:  anemia  head injury, recent stroke, or bleeding in the brain  liver disease  previous heart attack  an unusual or allergic reaction to nitroglycerin, other  medicines, foods, dyes, or preservatives  pregnant or trying to get pregnant  breast-feeding How should I use this medicine? Take this medicine by mouth as needed. At the first sign of an angina attack (chest pain or tightness) place one tablet under your tongue. You can also take this medicine 5 to 10 minutes before an event likely to produce chest pain. Follow the directions on the prescription label. Let the tablet dissolve under the tongue. Do not swallow whole. Replace the dose if you accidentally swallow it. It will help if your mouth is not dry. Saliva around the tablet will help it to dissolve more quickly. Do not eat or drink, smoke or chew tobacco while a tablet is dissolving. If you are not better within 5 minutes after taking ONE dose of nitroglycerin, call 9-1-1 immediately to seek emergency medical care. Do not take more than 3 nitroglycerin tablets over 15 minutes. If you take this medicine often to relieve symptoms of angina, your doctor or health care professional may provide you with different instructions to manage your symptoms. If symptoms do not go away after following these instructions, it is important to call 9-1-1 immediately. Do not take more than 3 nitroglycerin tablets over 15 minutes. Talk to your pediatrician regarding the use of this medicine in children. Special care may be needed. Overdosage: If you think you have taken too much of this medicine contact a poison control center or emergency room at once. NOTE: This medicine is only for you. Do not share this medicine with others. What if I miss a dose? This does not apply. This medicine  is only used as needed. What may interact with this medicine? Do not take this medicine with any of the following medications:  certain migraine medicines like ergotamine and dihydroergotamine (DHE)  medicines used to treat erectile dysfunction like sildenafil, tadalafil, and vardenafil  riociguat This medicine may also interact  with the following medications:  alteplase  aspirin  heparin  medicines for high blood pressure  medicines for mental depression  other medicines used to treat angina  phenothiazines like chlorpromazine, mesoridazine, prochlorperazine, thioridazine This list may not describe all possible interactions. Give your health care provider a list of all the medicines, herbs, non-prescription drugs, or dietary supplements you use. Also tell them if you smoke, drink alcohol, or use illegal drugs. Some items may interact with your medicine. What should I watch for while using this medicine? Tell your doctor or health care professional if you feel your medicine is no longer working. Keep this medicine with you at all times. Sit or lie down when you take your medicine to prevent falling if you feel dizzy or faint after using it. Try to remain calm. This will help you to feel better faster. If you feel dizzy, take several deep breaths and lie down with your feet propped up, or bend forward with your head resting between your knees. You may get drowsy or dizzy. Do not drive, use machinery, or do anything that needs mental alertness until you know how this drug affects you. Do not stand or sit up quickly, especially if you are an older patient. This reduces the risk of dizzy or fainting spells. Alcohol can make you more drowsy and dizzy. Avoid alcoholic drinks. Do not treat yourself for coughs, colds, or pain while you are taking this medicine without asking your doctor or health care professional for advice. Some ingredients may increase your blood pressure. What side effects may I notice from receiving this medicine? Side effects that you should report to your doctor or health care professional as soon as possible:  blurred vision  dry mouth  skin rash  sweating  the feeling of extreme pressure in the head  unusually weak or tired Side effects that usually do not require medical attention  (report to your doctor or health care professional if they continue or are bothersome):  flushing of the face or neck  headache  irregular heartbeat, palpitations  nausea, vomiting This list may not describe all possible side effects. Call your doctor for medical advice about side effects. You may report side effects to FDA at 1-800-FDA-1088. Where should I keep my medicine? Keep out of the reach of children. Store at room temperature between 20 and 25 degrees C (68 and 77 degrees F). Store in [employer]. Protect from light and moisture. Keep tightly closed. Throw away any unused medicine after the expiration date. NOTE: This sheet is a summary. It may not cover all possible information. If you have questions about this medicine, talk to your doctor, pharmacist, or health care provider.  2020 Elsevier/Gold Standard (2013-08-03 17:57:36)

## 2019-08-02 ENCOUNTER — Encounter: Payer: Self-pay | Admitting: Radiology

## 2019-09-12 ENCOUNTER — Other Ambulatory Visit: Payer: Self-pay | Admitting: Family Medicine

## 2019-09-12 DIAGNOSIS — F410 Panic disorder [episodic paroxysmal anxiety] without agoraphobia: Secondary | ICD-10-CM

## 2019-09-18 MED ORDER — PROGESTERONE MICRONIZED 100 MG PO CAPS: 100.0000 mg | ORAL_CAPSULE | Freq: Every day | ORAL | 1 refills | Status: DC

## 2019-10-10 ENCOUNTER — Other Ambulatory Visit: Payer: Self-pay

## 2019-10-10 ENCOUNTER — Encounter: Payer: Self-pay | Admitting: Family Medicine

## 2019-10-10 ENCOUNTER — Other Ambulatory Visit (HOSPITAL_COMMUNITY)
Admission: RE | Admit: 2019-10-10 | Discharge: 2019-10-10 | Disposition: A | Discharge status: Home / Self Care | Payer: Medicaid Other | Source: Ambulatory Visit | Attending: Family Medicine | Admitting: Family Medicine

## 2019-10-10 ENCOUNTER — Ambulatory Visit (INDEPENDENT_AMBULATORY_CARE_PROVIDER_SITE_OTHER): Payer: Medicaid Other | Admitting: Family Medicine

## 2019-10-10 VITALS — BP 158/107 | HR 111 | Wt 307.0 lb

## 2019-10-10 DIAGNOSIS — O34219 Maternal care for unspecified type scar from previous cesarean delivery: Secondary | ICD-10-CM

## 2019-10-10 DIAGNOSIS — O0991 Supervision of high risk pregnancy, unspecified, first trimester: Secondary | ICD-10-CM

## 2019-10-10 DIAGNOSIS — O161 Unspecified maternal hypertension, first trimester: Secondary | ICD-10-CM

## 2019-10-10 DIAGNOSIS — Z3A12 12 weeks gestation of pregnancy: Secondary | ICD-10-CM

## 2019-10-10 DIAGNOSIS — O99210 Obesity complicating pregnancy, unspecified trimester: Secondary | ICD-10-CM

## 2019-10-10 DIAGNOSIS — Z8632 Personal history of gestational diabetes: Secondary | ICD-10-CM

## 2019-10-10 DIAGNOSIS — Z362 Encounter for other antenatal screening follow-up: Secondary | ICD-10-CM | POA: Diagnosis not present

## 2019-10-10 DIAGNOSIS — O169 Unspecified maternal hypertension, unspecified trimester: Secondary | ICD-10-CM

## 2019-10-10 DIAGNOSIS — O09521 Supervision of elderly multigravida, first trimester: Secondary | ICD-10-CM

## 2019-10-10 DIAGNOSIS — O99211 Obesity complicating pregnancy, first trimester: Secondary | ICD-10-CM

## 2019-10-10 LAB — OB RESULTS CONSOLE GBS: GBS: POSITIVE

## 2019-10-10 MED ORDER — ASPIRIN EC 81 MG PO TBEC: 81.0000 mg | DELAYED_RELEASE_TABLET | Freq: Every day | ORAL | 3 refills | Status: DC

## 2019-10-10 NOTE — Assessment & Plan Note (Deleted)
Will check random CBGs and early A1C.

## 2019-10-10 NOTE — Progress Notes (Signed)
DATING AND VIABILITY SONOGRAM   ARMIE MOREN is a 38 y.o. year old [id number] with LMP Patient's last menstrual period was 07/17/2019 (lmp unknown). which would correlate to  Unknown weeks gestation.  She has regular menstrual cycles.   She is here today for a confirmatory initial sonogram.    GESTATION: SINGLETON yes  FETAL ACTIVITY:          Heart rate     168          The fetus is active.  GESTATIONAL AGE AND  BIOMETRICS:  Gestational criteria: Estimated Date of Delivery: None noted. by LMP now at Unknown  Previous Scans:0  GESTATIONAL SAC                     weeks  CROWN RUMP LENGTH           4.86 mm         11.4 weeks                                                                               AVERAGE EGA(BY THIS SCAN):  11.4 weeks  WORKING EDD( LMP ):  04/22/2020     TECHNICIAN COMMENTS:  Patient informed that the ultrasound is considered a limited obstetric ultrasound and is not intended to be a complete ultrasound exam. Patient also informed that the ultrasound is not being completed with the intent of assessing for fetal or placental anomalies or any pelvic abnormalities. Explained that the purpose of today's ultrasound is to assess for fetal heart rate. Patient acknowledges the purpose of the exam and the limitations of the study.      A copy of this report including all images has been saved and backed up to a second source for retrieval if needed. All measures and details of the anatomical scan, placentation, fluid volume and pelvic anatomy are contained in that report.  Crosby Oyster 10/10/2019 11:09 AM

## 2019-10-10 NOTE — Progress Notes (Signed)
Subjective:   [name] is a 38 y.o. [id number] at [redacted]w[redacted]d by LMP, early ultrasound being seen today for her first obstetrical visit.  Her obstetrical history is significant for advanced maternal age, obesity and hypertension, h/o gestational diabetes. Patient does intend to breast feed. Pregnancy history fully reviewed.  Patient reports no complaints.  HISTORY: OB History  Gravida Para Term Preterm AB Living  10 6 5 1 3 6   SAB TAB Ectopic Multiple Live Births  3 0 0 0 6    # Outcome Date GA Lbr Len/2nd Weight Sex Delivery Anes PTL Lv  10 Current           9 SAB 02/2018 [redacted]w[redacted]d         8 Term 11/14/15    M VBAC EPI  LIV  7 Term 06/14/15 [redacted]w[redacted]d 09:12 / 00:34 8 lb 2.7 oz (3.705 kg) F VBAC EPI  LIV     Name: Sauber,GIRL Lorianna     Apgar1: 8  Apgar5: 9  6 SAB 2015          5 Term 06/05/13 [redacted]w[redacted]d 03:16 / 00:21 7 lb 8 oz (3.402 kg) F VBAC EPI  LIV     Name: Wetherington,GIRL [place]     Apgar1: 9  Apgar5: 9  4 Term 2010 [redacted]w[redacted]d   F VBAC   LIV     Birth Comments: GHTN  3 SAB 2010          2 Term 2008 [redacted]w[redacted]d   M VBAC EPI  LIV     Birth Comments: GHTN  1 Preterm 2006 [redacted]w[redacted]d   M CS-LTranv Spinal  LIV     Birth Comments: pre eclampsia   Last pap smear was  08/30/2018 and was normal Past Medical History:  Diagnosis Date  . Anginal pain (HCC)   . Anxiety   . Deaf, left   . Gestational diabetes 2008    glyburide this pregnancy  . History of chicken pox   . Kidney stones 2013   13 passed  . Pregnancy induced hypertension    labetolol  . Preterm labor    delivery   Past Surgical History:  Procedure Laterality Date  . CESAREAN SECTION  2006  . WISDOM TOOTH EXTRACTION     x4   Family History  Problem Relation Age of Onset  . Schizophrenia Maternal Grandmother   . Atrial fibrillation Mother   . Depression Mother   . Anxiety disorder Mother   . Mental illness Father   . Heart attack Maternal Grandfather   . Atrial fibrillation Paternal Grandfather   . Diabetes Paternal  Grandfather   . Heart attack Paternal Grandfather   . Allergies Sister    Social History   Tobacco Use  . Smoking status: Never Smoker  . Smokeless tobacco: Never Used  Substance Use Topics  . Alcohol use: No    Alcohol/week: 0.0 standard drinks  . Drug use: No   Allergies  Allergen Reactions  . Ivp Dye [Iodinated Diagnostic Agents] Anaphylaxis  . Iodine   . Phenergan Fortis [Promethazine] Itching    Itching/pins and needles for hours.   Current Outpatient Medications on File Prior to Visit  Medication Sig Dispense Refill  . buPROPion (WELLBUTRIN) 100 MG tablet TAKE 1 TABLET BY MOUTH TWICE DAILY 60 tablet 2  . labetalol (NORMODYNE) 200 MG tablet Take 1 tablet (200 mg total) by mouth every 12 (twelve) hours. (Patient not taking: Reported on 10/10/2019) 60 tablet 5  .  nitroGLYCERIN (NITROSTAT) 0.4 MG SL tablet Place 1 tablet (0.4 mg total) under the tongue every 5 (five) minutes as needed for chest pain. 90 tablet 3  . progesterone (PROMETRIUM) 100 MG capsule Place 1 capsule (100 mg total) vaginally daily. (Patient not taking: Reported on 10/10/2019) 90 capsule 1   No current facility-administered medications on file prior to visit.     Exam   Vitals:   10/10/19 1033  BP: (!) 158/107  Pulse: (!) 111  Weight: (!) 307 lb (139.3 kg)   Fetal Heart Rate (bpm): 168  System: General: well-developed, well-nourished female in no acute distress   Skin: normal coloration and turgor, no rashes   Neurologic: oriented, normal, negative, normal mood   Extremities: normal strength, tone, and muscle mass, ROM of all joints is normal   HEENT extraocular movement intact and sclera clear, anicteric   Mouth/Teeth mucous membranes moist, pharynx normal without lesions and dental hygiene good   Neck supple and no masses   Cardiovascular: regular rate and rhythm   Respiratory:  no respiratory distress, normal effort   Abdomen: soft, non-tender; bowel sounds normal; no masses,  no  organomegaly     Assessment:   Pregnancy: [id number] Patient Active Problem List   Diagnosis Date Noted  . Obesity in pregnancy, antepartum     Priority: Medium  . AMA (advanced maternal age) multigravida 35+ 11/27/2014    Priority: Medium  . Previous cesarean section complicating pregnancy, antepartum condition or complication 00/93/8182    Priority: Medium  . Hypertension in pregnancy, antepartum 04/28/2013    Priority: Medium  . Chest pain 04/18/2019  . Gastroenteritis 04/18/2019  . Hyperglycemia 04/18/2019  . Depression, major, single episode 09/27/2018  . Atypical squamous cells cannot exclude high grade squamous intraepithelial lesion on cytologic smear of cervix (ASC-H) 07/10/2016  . Supervision of high risk pregnancy, antepartum, first trimester 04/16/2016  . History of gestational diabetes 04/16/2016  . Episodic paroxysmal anxiety disorder 09/06/2015  . Excess, menstruation 09/06/2015  . Kidney stone 09/06/2015  . Insomnia 09/06/2015  . Allergic rhinitis 09/06/2015  . Vitamin D insufficiency 11/30/2014  . Chronic fatigue 11/27/2014  . Nephrolithiasis 11/29/2012  . Hydronephrosis, right 11/08/2012  . Anxiety with flying   . Obesity (BMI 35.0-39.9 without comorbidity) 12/23/2007  . GERD 12/23/2007  . Acquired cyst of kidney 12/23/2007     Plan:  1. Hypertension during pregnancy, antepartum, unspecified hypertension in pregnancy type Baseline labs- Check BP at home and log in [employer], begin ASA, has labetalol if needed. - CMP (Comprehensive metabolic panel) - Protein / creatinine ratio, urine - aspirin EC 81 MG tablet; Take 1 tablet (81 mg total) by mouth daily.  Dispense: 90 tablet; Refill: 3  2. Obesity in pregnancy, antepartum - Hemoglobin A1c  3. Supervision of high risk pregnancy, antepartum, first trimester New OB labs - Culture, OB Urine - Obstetric Panel, Including HIV - Cervicovaginal ancillary only( [place]) - [place] MFM OB COMP + 14 WK;  Future - Enroll patient in [employer] Program  4. History of gestational diabetes Will check CBGs and log in babyscripts  5. Multigravida of advanced maternal age in first trimester Declines genetics and testing  6. Previous cesarean section complicating pregnancy, antepartum condition or complication Desires VBAC again   Initial labs drawn. Continue prenatal vitamins. Genetic Screening discussed, NIPS: declined. Ultrasound discussed; fetal anatomic survey: ordered. Problem list reviewed and updated.  Routine obstetric precautions reviewed. Return in about 4 weeks (around 11/07/2019) for virtual.

## 2019-10-10 NOTE — Progress Notes (Signed)
cmpHistory of Hypertension- Patient is not taking labetalol Patient reports small amount of bleeding the last couple of days.  Declined flu  Last pap 08/30/2018- normal  No genetic testing today  Medical home form

## 2019-10-11 LAB — OBSTETRIC PANEL, INCLUDING HIV
Antibody Screen: NEGATIVE
Basophils Absolute: 0 10*3/uL (ref 0.0–0.2)
Basos: 0 %
EOS (ABSOLUTE): 0 10*3/uL (ref 0.0–0.4)
Eos: 1 %
HIV Screen 4th Generation wRfx: NONREACTIVE
Hematocrit: 35.4 % (ref 34.0–46.6)
Hemoglobin: 11.6 g/dL (ref 11.1–15.9)
Hepatitis B Surface Ag: NEGATIVE
Immature Grans (Abs): 0 10*3/uL (ref 0.0–0.1)
Immature Granulocytes: 0 %
Lymphocytes Absolute: 1.4 10*3/uL (ref 0.7–3.1)
Lymphs: 18 %
MCH: 25.3 pg — ABNORMAL LOW (ref 26.6–33.0)
MCHC: 32.8 g/dL (ref 31.5–35.7)
MCV: 77 fL — ABNORMAL LOW (ref 79–97)
Monocytes Absolute: 0.4 10*3/uL (ref 0.1–0.9)
Monocytes: 5 %
Neutrophils Absolute: 6 10*3/uL (ref 1.4–7.0)
Neutrophils: 76 %
Platelets: 230 10*3/uL (ref 150–450)
RBC: 4.58 x10E6/uL (ref 3.77–5.28)
RDW: 14.8 % (ref 11.7–15.4)
RPR Ser Ql: NONREACTIVE
Rh Factor: POSITIVE
Rubella Antibodies, IGG: 1.31 index (ref >0.99)
WBC: 7.9 10*3/uL (ref 3.4–10.8)

## 2019-10-11 LAB — HEMOGLOBIN A1C
Est. average glucose Bld gHb Est-mCnc: 114 mg/dL
Hgb A1c MFr Bld: 5.6 % (ref 4.8–5.6)

## 2019-10-11 LAB — COMPREHENSIVE METABOLIC PANEL
ALT: 12 IU/L (ref 0–32)
AST: 13 IU/L (ref 0–40)
Albumin/Globulin Ratio: 1.5 (ref 1.2–2.2)
Albumin: 3.7 g/dL — ABNORMAL LOW (ref 3.8–4.8)
Alkaline Phosphatase: 45 IU/L (ref 39–117)
BUN/Creatinine Ratio: 14 (ref 9–23)
BUN: 10 mg/dL (ref 6–20)
Bilirubin Total: 0.2 mg/dL (ref 0.0–1.2)
CO2: 20 mmol/L (ref 20–29)
Calcium: 9 mg/dL (ref 8.7–10.2)
Chloride: 106 mmol/L (ref 96–106)
Creatinine, Ser: 0.7 mg/dL (ref 0.57–1.00)
GFR calc Af Amer: 126 mL/min/{1.73_m2} (ref >59)
GFR calc non Af Amer: 109 mL/min/{1.73_m2} (ref >59)
Globulin, Total: 2.5 g/dL (ref 1.5–4.5)
Glucose: 93 mg/dL (ref 65–99)
Potassium: 4.1 mmol/L (ref 3.5–5.2)
Sodium: 140 mmol/L (ref 134–144)
Total Protein: 6.2 g/dL (ref 6.0–8.5)

## 2019-10-11 LAB — PROTEIN / CREATININE RATIO, URINE
Creatinine, Urine: 253.9 mg/dL
Protein, Ur: 17.7 mg/dL
Protein/Creat Ratio: 70 mg/g creat (ref 0–200)

## 2019-10-15 LAB — URINE CULTURE, OB REFLEX

## 2019-10-15 LAB — CULTURE, OB URINE

## 2019-10-16 ENCOUNTER — Other Ambulatory Visit: Payer: Self-pay | Admitting: Family Medicine

## 2019-10-16 DIAGNOSIS — O0991 Supervision of high risk pregnancy, unspecified, first trimester: Secondary | ICD-10-CM

## 2019-10-16 DIAGNOSIS — R8271 Bacteriuria: Secondary | ICD-10-CM | POA: Insufficient documentation

## 2019-10-16 MED ORDER — AMOXICILLIN 500 MG PO CAPS: 500.0000 mg | ORAL_CAPSULE | Freq: Three times a day (TID) | ORAL | 2 refills | Status: DC

## 2019-10-20 NOTE — L&D Delivery Note (Addendum)
OB/GYN Faculty Practice Delivery Note  [name] is a 39 y.o. [id number] s/p VBAC at [redacted]w[redacted]d. She was admitted for IOL for worsening cHTN.   ROM: 10h 54m with clear fluid GBS Status: Positive/-- (12/22 0000) Maximum Maternal Temperature: 98.55F  Labor Progress: . Initial SVE: 1/50/-3. Patient received Foley balloon, Pitocin and AROM. Received epidural. She then progressed to complete.   Delivery Date/Time: 6/14 @ 0341 Delivery: Called to room as patient requesting repeat c-section. Cervical exam at this time 9.5/100/0 and requested patient start pushing. Four contractions later, head delivered ROA. No nuchal cord present. Shoulder and body delivered in usual fashion. Infant with spontaneous cry, placed on mother's abdomen, dried and stimulated. Cord clamped x 2 after 15 minute delay (per parental request), and cut by FOB. Cord blood drawn. Placenta delivered spontaneously with gentle cord traction. Fundus firm with massage and Pitocin. Labia, perineum, vagina, and cervix inspected inspected with no lacerations. Rectal Cytotec placed due to multiparity.  Baby Weight: pending  Placenta: Sent home with patient Complications: None Lacerations: None EBL: 205 mL Analgesia: Epidural   Infant:  APGAR (1 MIN): 9   APGAR (5 MINS): 9   APGAR (10 MINS):     Jerilynn Birkenhead, MD OB Family Medicine Fellow, [facility] for [facility], [facility] 04/01/2020, 4:08 AM

## 2019-10-24 LAB — CERVICOVAGINAL ANCILLARY ONLY
Bacterial Vaginitis-Urine: NEGATIVE
Candida Urine: NEGATIVE
Chlamydia: NEGATIVE
Comment: NEGATIVE
Comment: NEGATIVE
Comment: NORMAL
Neisseria Gonorrhea: NEGATIVE
Trichomonas: NEGATIVE

## 2019-11-06 ENCOUNTER — Ambulatory Visit (INDEPENDENT_AMBULATORY_CARE_PROVIDER_SITE_OTHER): Payer: Medicaid Other | Admitting: Family Medicine

## 2019-11-06 ENCOUNTER — Other Ambulatory Visit: Payer: Self-pay

## 2019-11-06 VITALS — BP 161/98 | HR 94 | Wt 319.0 lb

## 2019-11-06 DIAGNOSIS — Z8632 Personal history of gestational diabetes: Secondary | ICD-10-CM

## 2019-11-06 DIAGNOSIS — Z3A16 16 weeks gestation of pregnancy: Secondary | ICD-10-CM

## 2019-11-06 DIAGNOSIS — I10 Essential (primary) hypertension: Secondary | ICD-10-CM

## 2019-11-06 DIAGNOSIS — O09521 Supervision of elderly multigravida, first trimester: Secondary | ICD-10-CM

## 2019-11-06 DIAGNOSIS — O0991 Supervision of high risk pregnancy, unspecified, first trimester: Secondary | ICD-10-CM

## 2019-11-06 DIAGNOSIS — O162 Unspecified maternal hypertension, second trimester: Secondary | ICD-10-CM

## 2019-11-06 DIAGNOSIS — O0992 Supervision of high risk pregnancy, unspecified, second trimester: Secondary | ICD-10-CM

## 2019-11-06 DIAGNOSIS — O169 Unspecified maternal hypertension, unspecified trimester: Secondary | ICD-10-CM

## 2019-11-06 DIAGNOSIS — O09522 Supervision of elderly multigravida, second trimester: Secondary | ICD-10-CM

## 2019-11-06 MED ORDER — LABETALOL HCL 200 MG PO TABS: 200.0000 mg | ORAL_TABLET | Freq: Two times a day (BID) | ORAL | 5 refills | Status: DC

## 2019-11-06 MED ORDER — ASPIRIN EC 81 MG PO TBEC: 81.0000 mg | DELAYED_RELEASE_TABLET | Freq: Every day | ORAL | 3 refills | Status: DC

## 2019-11-06 NOTE — Patient Instructions (Signed)
 Breastfeeding  Choosing to breastfeed is one of the [place] decisions you can make for yourself and your baby. A change in hormones during pregnancy causes your breasts to make breast milk in your milk-producing glands. Hormones prevent breast milk from being released before your baby is born. They also prompt milk flow after birth. Once breastfeeding has begun, thoughts of your baby, as well as his or her sucking or crying, can stimulate the release of milk from your milk-producing glands. Benefits of breastfeeding Research shows that breastfeeding offers many health benefits for infants and mothers. It also offers a cost-free and convenient way to feed your baby. For your baby  Your first milk (colostrum) helps your baby's digestive system to function better.  Special cells in your milk (antibodies) help your baby to fight off infections.  Breastfed babies are less likely to develop asthma, allergies, obesity, or type 2 diabetes. They are also at lower risk for sudden infant death syndrome (SIDS).  Nutrients in breast milk are better able to meet your baby's needs compared to infant formula.  Breast milk improves your baby's brain development. For you  Breastfeeding helps to create a very special bond between you and your baby.  Breastfeeding is convenient. Breast milk costs nothing and is always available at the correct temperature.  Breastfeeding helps to burn calories. It helps you to lose the weight that you gained during pregnancy.  Breastfeeding makes your uterus return faster to its size before pregnancy. It also slows bleeding (lochia) after you give birth.  Breastfeeding helps to lower your risk of developing type 2 diabetes, osteoporosis, rheumatoid arthritis, cardiovascular disease, and breast, ovarian, uterine, and endometrial cancer later in life. Breastfeeding basics Starting breastfeeding  Find a comfortable place to sit or lie down, with your neck and back  well-supported.  Place a pillow or a rolled-up blanket under your baby to bring him or her to the level of your breast (if you are seated). Nursing pillows are specially designed to help support your arms and your baby while you breastfeed.  Make sure that your baby's tummy (abdomen) is facing your abdomen.  Gently massage your breast. With your fingertips, massage from the outer edges of your breast inward toward the nipple. This encourages milk flow. If your milk flows slowly, you may need to continue this action during the feeding.  Support your breast with 4 fingers underneath and your thumb above your nipple (make the letter "C" with your hand). Make sure your fingers are well away from your nipple and your baby's mouth.  Stroke your baby's lips gently with your finger or nipple.  When your baby's mouth is open wide enough, quickly bring your baby to your breast, placing your entire nipple and as much of the areola as possible into your baby's mouth. The areola is the colored area around your nipple. ? More areola should be visible above your baby's upper lip than below the lower lip. ? Your baby's lips should be opened and extended outward (flanged) to ensure an adequate, comfortable latch. ? Your baby's tongue should be between his or her lower gum and your breast.  Make sure that your baby's mouth is correctly positioned around your nipple (latched). Your baby's lips should create a seal on your breast and be turned out (everted).  It is common for your baby to suck about 2-3 minutes in order to start the flow of breast milk. Latching Teaching your baby how to latch onto your breast properly   is very important. An improper latch can cause nipple pain, decreased milk supply, and poor weight gain in your baby. Also, if your baby is not latched onto your nipple properly, he or she may swallow some air during feeding. This can make your baby fussy. Burping your baby when you switch breasts  during the feeding can help to get rid of the air. However, teaching your baby to latch on properly is still the [place] way to prevent fussiness from swallowing air while breastfeeding. Signs that your baby has successfully latched onto your nipple  Silent tugging or silent sucking, without causing you pain. Infant's lips should be extended outward (flanged).  Swallowing heard between every 3-4 sucks once your milk has started to flow (after your let-down milk reflex occurs).  Muscle movement above and in front of his or her ears while sucking. Signs that your baby has not successfully latched onto your nipple  Sucking sounds or smacking sounds from your baby while breastfeeding.  Nipple pain. If you think your baby has not latched on correctly, slip your finger into the corner of your baby's mouth to break the suction and place it between your baby's gums. Attempt to start breastfeeding again. Signs of successful breastfeeding Signs from your baby  Your baby will gradually decrease the number of sucks or will completely stop sucking.  Your baby will fall asleep.  Your baby's body will relax.  Your baby will retain a small amount of milk in his or her mouth.  Your baby will let go of your breast by himself or herself. Signs from you  Breasts that have increased in firmness, weight, and size 1-3 hours after feeding.  Breasts that are softer immediately after breastfeeding.  Increased milk volume, as well as a change in milk consistency and color by the fifth day of breastfeeding.  Nipples that are not sore, cracked, or bleeding. Signs that your baby is getting enough milk  Wetting at least 1-2 diapers during the first 24 hours after birth.  Wetting at least 5-6 diapers every 24 hours for the first week after birth. The urine should be clear or pale yellow by the age of 5 days.  Wetting 6-8 diapers every 24 hours as your baby continues to grow and develop.  At least 3 stools in  a 24-hour period by the age of 5 days. The stool should be soft and yellow.  At least 3 stools in a 24-hour period by the age of 7 days. The stool should be seedy and yellow.  No loss of weight greater than 10% of birth weight during the first 3 days of life.  Average weight gain of 4-7 oz (113-198 g) per week after the age of 4 days.  Consistent daily weight gain by the age of 5 days, without weight loss after the age of 2 weeks. After a feeding, your baby may spit up a small amount of milk. This is normal. Breastfeeding frequency and duration Frequent feeding will help you make more milk and can prevent sore nipples and extremely full breasts (breast engorgement). Breastfeed when you feel the need to reduce the fullness of your breasts or when your baby shows signs of hunger. This is called "breastfeeding on demand." Signs that your baby is hungry include:  Increased alertness, activity, or restlessness.  Movement of the head from side to side.  Opening of the mouth when the corner of the mouth or cheek is stroked (rooting).  Increased sucking sounds, smacking lips,   cooing, sighing, or squeaking.  Hand-to-mouth movements and sucking on fingers or hands.  Fussing or crying. Avoid introducing a pacifier to your baby in the first 4-6 weeks after your baby is born. After this time, you may choose to use a pacifier. Research has shown that pacifier use during the first year of a baby's life decreases the risk of sudden infant death syndrome (SIDS). Allow your baby to feed on each breast as long as he or she wants. When your baby unlatches or falls asleep while feeding from the first breast, offer the second breast. Because newborns are often sleepy in the first few weeks of life, you may need to awaken your baby to get him or her to feed. Breastfeeding times will vary from baby to baby. However, the following rules can serve as a guide to help you make sure that your baby is properly  fed:  Newborns (babies 4 weeks of age or younger) may breastfeed every 1-3 hours.  Newborns should not go without breastfeeding for longer than 3 hours during the day or 5 hours during the night.  You should breastfeed your baby a minimum of 8 times in a 24-hour period. Breast milk pumping     Pumping and storing breast milk allows you to make sure that your baby is exclusively fed your breast milk, even at times when you are unable to breastfeed. This is especially important if you go back to work while you are still breastfeeding, or if you are not able to be present during feedings. Your lactation consultant can help you find a method of pumping that works [place] for you and give you guidelines about how long it is safe to store breast milk. Caring for your breasts while you breastfeed Nipples can become dry, cracked, and sore while breastfeeding. The following recommendations can help keep your breasts moisturized and healthy:  Avoid using soap on your nipples.  Wear a supportive bra designed especially for nursing. Avoid wearing underwire-style bras or extremely tight bras (sports bras).  Air-dry your nipples for 3-4 minutes after each feeding.  Use only cotton bra pads to absorb leaked breast milk. Leaking of breast milk between feedings is normal.  Use lanolin on your nipples after breastfeeding. Lanolin helps to maintain your skin's normal moisture barrier. Pure lanolin is not harmful (not toxic) to your baby. You may also hand express a few drops of breast milk and gently massage that milk into your nipples and allow the milk to air-dry. In the first few weeks after giving birth, some women experience breast engorgement. Engorgement can make your breasts feel heavy, warm, and tender to the touch. Engorgement peaks within 3-5 days after you give birth. The following recommendations can help to ease engorgement:  Completely empty your breasts while breastfeeding or pumping. You may  want to start by applying warm, moist heat (in the shower or with warm, water-soaked hand towels) just before feeding or pumping. This increases circulation and helps the milk flow. If your baby does not completely empty your breasts while breastfeeding, pump any extra milk after he or she is finished.  Apply ice packs to your breasts immediately after breastfeeding or pumping, unless this is too uncomfortable for you. To do this: ? Put ice in a plastic bag. ? Place a towel between your skin and the bag. ? Leave the ice on for 20 minutes, 2-3 times a day.  Make sure that your baby is latched on and positioned properly while breastfeeding.   If engorgement persists after 48 hours of following these recommendations, contact your health care provider or a lactation consultant. Overall health care recommendations while breastfeeding  Eat 3 healthy meals and 3 snacks every day. Well-nourished mothers who are breastfeeding need an additional 450-500 calories a day. You can meet this requirement by increasing the amount of a balanced diet that you eat.  Drink enough water to keep your urine pale yellow or clear.  Rest often, relax, and continue to take your prenatal vitamins to prevent fatigue, stress, and low vitamin and mineral levels in your body (nutrient deficiencies).  Do not use any products that contain nicotine or tobacco, such as cigarettes and e-cigarettes. Your baby may be harmed by chemicals from cigarettes that pass into breast milk and exposure to secondhand smoke. If you need help quitting, ask your health care provider.  Avoid alcohol.  Do not use illegal drugs or marijuana.  Talk with your health care provider before taking any medicines. These include over-the-counter and prescription medicines as well as vitamins and herbal supplements. Some medicines that may be harmful to your baby can pass through breast milk.  It is possible to become pregnant while breastfeeding. If birth  control is desired, ask your health care provider about options that will be safe while breastfeeding your baby. Where to find more information: [employer]: www.llli.org Contact a health care provider if:  You feel like you want to stop breastfeeding or have become frustrated with breastfeeding.  Your nipples are cracked or bleeding.  Your breasts are red, tender, or warm.  You have: ? Painful breasts or nipples. ? A swollen area on either breast. ? A fever or chills. ? Nausea or vomiting. ? Drainage other than breast milk from your nipples.  Your breasts do not become full before feedings by the fifth day after you give birth.  You feel sad and depressed.  Your baby is: ? Too sleepy to eat well. ? Having trouble sleeping. ? More than 1 week old and wetting fewer than 6 diapers in a 24-hour period. ? Not gaining weight by 5 days of age.  Your baby has fewer than 3 stools in a 24-hour period.  Your baby's skin or the white parts of his or her eyes become yellow. Get help right away if:  Your baby is overly tired (lethargic) and does not want to wake up and feed.  Your baby develops an unexplained fever. Summary  Breastfeeding offers many health benefits for infant and mothers.  Try to breastfeed your infant when he or she shows early signs of hunger.  Gently tickle or stroke your baby's lips with your finger or nipple to allow the baby to open his or her mouth. Bring the baby to your breast. Make sure that much of the areola is in your baby's mouth. Offer one side and burp the baby before you offer the other side.  Talk with your health care provider or lactation consultant if you have questions or you face problems as you breastfeed. This information is not intended to replace advice given to you by your health care provider. Make sure you discuss any questions you have with your health care provider. Document Revised: 12/30/2017 Document Reviewed:  11/06/2016 Elsevier Patient Education  2020 [employer]  

## 2019-11-06 NOTE — Progress Notes (Signed)
   PRENATAL VISIT NOTE  Subjective:  [name] is a 39 y.o. [id number] at [redacted]w[redacted]d being seen today for ongoing prenatal care.  She is currently monitored for the following issues for this high-risk pregnancy and has Obesity (BMI 35.0-39.9 without comorbidity); GERD; Acquired cyst of kidney; Anxiety with flying; Hydronephrosis, right; Nephrolithiasis; Hypertension in pregnancy, antepartum; Chronic fatigue; AMA (advanced maternal age) multigravida 35+; Previous cesarean section complicating pregnancy, antepartum condition or complication; Vitamin D insufficiency; Obesity in pregnancy, antepartum; Episodic paroxysmal anxiety disorder; Excess, menstruation; Kidney stone; Insomnia; Allergic rhinitis; Supervision of high risk pregnancy, antepartum, first trimester; History of gestational diabetes; Atypical squamous cells cannot exclude high grade squamous intraepithelial lesion on cytologic smear of cervix (ASC-H); Depression, major, single episode; Chest pain; Gastroenteritis; Hyperglycemia; and Group B streptococcal bacteriuria on their problem list.  Patient reports no complaints.   . Vag. Bleeding: None.  Movement: Present. Denies leaking of fluid.   The following portions of the patient's history were reviewed and updated as appropriate: allergies, current medications, past family history, past medical history, past social history, past surgical history and problem list.   Objective:   Vitals:   11/06/19 1505  BP: (!) 161/98  Pulse: 94  Weight: (!) 319 lb (144.7 kg)    Fetal Status: Fetal Heart Rate (bpm): 154   Movement: Present     General:  Alert, oriented and cooperative. Patient is in no acute distress.  Skin: Skin is warm and dry. No rash noted.   Cardiovascular: Normal heart rate noted  Respiratory: Normal respiratory effort, no problems with respiration noted  Abdomen: Soft, gravid, appropriate for gestational age.  Pain/Pressure: Absent     Pelvic: Cervical exam deferred         Extremities: Normal range of motion.  Edema: Trace  Mental Status: Normal mood and affect. Normal behavior. Normal judgment and thought content.   Assessment and Plan:  Pregnancy: [id number] at [redacted]w[redacted]d   1. Multigravida of advanced maternal age in first trimester Declined NIPT  2. History of gestational diabetes Nml fasting at home and A1C.  3. Supervision of high risk pregnancy, antepartum, first trimester Has anantomy u/s scheduled.  4. Hypertension during pregnancy, antepartum, unspecified hypertension in pregnancy type BP is still up--will start Labetalol and needs to start ASA - labetalol (NORMODYNE) 200 MG tablet; Take 1 tablet (200 mg total) by mouth every 12 (twelve) hours.  Dispense: 180 tablet; Refill: 5 - aspirin EC 81 MG tablet; Take 1 tablet (81 mg total) by mouth daily.  Dispense: 90 tablet; Refill: 3  General obstetric precautions including but not limited to vaginal bleeding, contractions, leaking of fluid and fetal movement were reviewed in detail with the patient. Please refer to After Visit Summary for other counseling recommendations.   Return in 4 weeks (on 12/04/2019) for virtual, needs U/S.  Future Appointments  Date Time Provider Department Center  11/27/2019 10:30 AM [facility] [place] [facility]  12/14/2019 10:30 AM Reva Bores, MD [facility]    Reva Bores, MD

## 2019-11-07 ENCOUNTER — Encounter: Payer: Medicaid Other | Admitting: Obstetrics and Gynecology

## 2019-11-27 ENCOUNTER — Encounter: Payer: Self-pay | Admitting: Internal Medicine

## 2019-11-27 ENCOUNTER — Ambulatory Visit (INDEPENDENT_AMBULATORY_CARE_PROVIDER_SITE_OTHER): Payer: Medicaid Other | Admitting: Internal Medicine

## 2019-11-27 ENCOUNTER — Encounter (HOSPITAL_COMMUNITY): Payer: Self-pay

## 2019-11-27 ENCOUNTER — Other Ambulatory Visit (HOSPITAL_COMMUNITY): Payer: Self-pay | Admitting: *Deleted

## 2019-11-27 ENCOUNTER — Ambulatory Visit (HOSPITAL_COMMUNITY)
Admission: RE | Admit: 2019-11-27 | Discharge: 2019-11-27 | Disposition: A | Discharge status: Home / Self Care | Payer: Medicaid Other | Source: Ambulatory Visit | Attending: Obstetrics and Gynecology | Admitting: Obstetrics and Gynecology

## 2019-11-27 ENCOUNTER — Ambulatory Visit (HOSPITAL_COMMUNITY): Payer: Medicaid Other | Admitting: *Deleted

## 2019-11-27 ENCOUNTER — Other Ambulatory Visit: Payer: Self-pay

## 2019-11-27 VITALS — BP 156/86 | HR 93 | Temp 97.9°F | Ht 73.0 in | Wt 321.8 lb

## 2019-11-27 VITALS — BP 150/78 | Temp 98.0°F

## 2019-11-27 DIAGNOSIS — R8271 Bacteriuria: Secondary | ICD-10-CM

## 2019-11-27 DIAGNOSIS — O169 Unspecified maternal hypertension, unspecified trimester: Secondary | ICD-10-CM

## 2019-11-27 DIAGNOSIS — O10919 Unspecified pre-existing hypertension complicating pregnancy, unspecified trimester: Secondary | ICD-10-CM | POA: Diagnosis not present

## 2019-11-27 DIAGNOSIS — R079 Chest pain, unspecified: Secondary | ICD-10-CM | POA: Diagnosis not present

## 2019-11-27 DIAGNOSIS — O34219 Maternal care for unspecified type scar from previous cesarean delivery: Secondary | ICD-10-CM | POA: Diagnosis not present

## 2019-11-27 DIAGNOSIS — O09292 Supervision of pregnancy with other poor reproductive or obstetric history, second trimester: Secondary | ICD-10-CM | POA: Diagnosis not present

## 2019-11-27 DIAGNOSIS — O99212 Obesity complicating pregnancy, second trimester: Secondary | ICD-10-CM | POA: Diagnosis not present

## 2019-11-27 DIAGNOSIS — O09522 Supervision of elderly multigravida, second trimester: Secondary | ICD-10-CM

## 2019-11-27 DIAGNOSIS — O09529 Supervision of elderly multigravida, unspecified trimester: Secondary | ICD-10-CM

## 2019-11-27 DIAGNOSIS — O0991 Supervision of high risk pregnancy, unspecified, first trimester: Secondary | ICD-10-CM | POA: Diagnosis not present

## 2019-11-27 DIAGNOSIS — Z3A18 18 weeks gestation of pregnancy: Secondary | ICD-10-CM

## 2019-11-27 DIAGNOSIS — O09212 Supervision of pregnancy with history of pre-term labor, second trimester: Secondary | ICD-10-CM | POA: Diagnosis not present

## 2019-11-27 MED ORDER — NIFEDIPINE ER OSMOTIC RELEASE 30 MG PO TB24: 30.0000 mg | ORAL_TABLET | Freq: Every day | ORAL | 6 refills | Status: DC

## 2019-11-27 NOTE — Progress Notes (Signed)
Cardiology Office Note:    Date:  11/27/2019   ID:  [name], DOB 10-Jul-1981, MRN [id number]  PCP:  Malva Limes, MD  Cardiologist:  No primary care provider on file.  Electrophysiologist:  None   Referring MD: Malva Limes, MD   Chief Complaint: Chest pain, current pregnancy  History of Present Illness:    [name] is a 39 y.o. female with a history of kidney stones and hypertension.  She was seen in hospital for chest pain due to an elevated troponin.  CCTA was unremarkable for CAD though it was a somewhat suboptimal contrast opacification study.  She has had 2 episodes of a chest sensation since July that last for approximately 10 seconds.  They feel more like skipped beats or pauses in her heart rhythm more so than chest discomfort.  She has not had to take nitroglycerin.  These episodes occurred several months ago and then 1 prior to becoming pregnant.  She is currently [redacted] weeks pregnant with a little girl.  Of note she has 6 children.  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  She stays quite active and busy during the day.  She has no significant symptoms of exertional chest pain or shortness of breath.  She takes labetalol for hypertension.  She had stopped for a period of time because her blood pressure improved with weight loss, but recently she restarted several weeks ago.  She has tolerated this well but has noticed suboptimal blood pressure control at home.  She is currently on labetalol 200 mg twice daily.  She is a former trauma nurse and feels very comfortable taking her blood pressure at home.  Past Medical History:  Diagnosis Date  . Anginal pain (HCC)   . Anxiety   . Deaf, left   . Gestational diabetes 2008    glyburide this pregnancy  . History of chicken pox   . Kidney stones 2013   13 passed  . Pregnancy induced hypertension    labetolol  . Preterm labor    delivery    Past Surgical History:  Procedure  Laterality Date  . CESAREAN SECTION  2006  . WISDOM TOOTH EXTRACTION     x4    Current Medications: Current Meds  Medication Sig  . Ascorbic Acid (VITA-C PO) Take by mouth.  Marland Kitchen aspirin EC 81 MG tablet Take 1 tablet (81 mg total) by mouth daily.  Marland Kitchen labetalol (NORMODYNE) 200 MG tablet Take 1 tablet (200 mg total) by mouth every 12 (twelve) hours.  . Multiple Vitamins-Minerals (ZINC PO) Take by mouth.  Marland Kitchen VITAMIN D PO Take by mouth.     Allergies:   Ivp dye [iodinated diagnostic agents], Iodine, and Phenergan fortis [promethazine]   Social History   Socioeconomic History  . Marital status: Married    Spouse name: Not on file  . Number of children: Not on file  . Years of education: Not on file  . Highest education level: Not on file  Occupational History  . 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 Smoking status: Never Smoker  . Smokeless tobacco: Never Used  Substance and Sexual Activity  . Alcohol use: No    Alcohol/week: 0.0 standard drinks  . Drug use: No  . Sexual activity: Yes    Partners: Male    Birth control/protection: None  Other Topics Concern  . Not on file  Social History Narrative  . Not on file   Social Determinants of Health   Financial Resource Strain:   . Difficulty of Paying Living Expenses: Not on file  Food Insecurity:   . Worried About Charity fundraiser in the Last Year: Not on file  . Ran Out of Food in the Last Year: Not on file  Transportation Needs:   . Lack of Transportation (Medical): Not on file  . Lack of Transportation (Non-Medical): Not on file  Physical Activity:   . Days of Exercise per Week: Not on file  . Minutes of Exercise per Session: Not on file  Stress:   . Feeling of Stress : Not on file  Social Connections:   . Frequency of Communication with Friends and Family: Not on file  . Frequency of Social Gatherings with Friends and Family: Not on file  . Attends Religious Services: Not on  file  . Active Member of Clubs or Organizations: Not on file  . Attends Archivist Meetings: Not on file  . Marital Status: Not on file     Family History: The patient's family history includes Allergies in her sister; Anxiety disorder in her mother; Atrial fibrillation in her mother and paternal grandfather; Depression in her mother; Diabetes in her paternal grandfather; Heart attack in her maternal grandfather and paternal grandfather; Mental illness in her father; Schizophrenia in her maternal grandmother.  ROS:   Please see the history of present illness.    All other systems reviewed and are negative.  EKGs/Labs/Other Studies Reviewed:    The following studies were reviewed today:  EKG: Normal sinus rhythm, rate 93  Recent Labs: 04/18/2019: TSH 1.791 10/10/2019: ALT 12; BUN 10; Creatinine, Ser 0.70; Hemoglobin 11.6; Platelets 230; Potassium 4.1; Sodium 140  Recent Lipid Panel    Component Value Date/Time   CHOL 126 04/18/2019 0937   TRIG 80 04/18/2019 0937   HDL 38 (L) 04/18/2019 0937   CHOLHDL 3.3 04/18/2019 0937   VLDL 16 04/18/2019 0937   LDLCALC 72 04/18/2019 0937    Physical Exam:    VS:  BP (!) 156/86   Pulse 93   Temp 97.9 F (36.6 C)   Ht 6\' 1"  (1.854 m)   Wt (!) 321 lb 12.8 oz (146 kg)   LMP 07/17/2019 (LMP Unknown)   SpO2 99%   BMI 42.46 kg/m     Wt Readings from Last 5 Encounters:  11/27/19 (!) 321 lb 12.8 oz (146 kg)  11/06/19 (!) 319 lb (144.7 kg)  10/10/19 (!) 307 lb (139.3 kg)  05/12/19 (!) 302 lb (137 kg)  04/19/19 299 lb 8 oz (135.9 kg)     Constitutional: No acute distress Eyes: sclera non-icteric, normal conjunctiva and lids ENMT: normal dentition, moist mucous membranes Cardiovascular: regular rhythm, normal rate, no murmurs. S1 and S2 normal. Radial pulses normal bilaterally. No jugular venous distention.  Respiratory: clear to auscultation bilaterally GI : normal bowel sounds, soft and nontender.  Gravid abdomen. MSK:  extremities warm, well perfused. No edema.  NEURO: grossly nonfocal exam, moves all extremities. PSYCH: alert and oriented x 3, normal mood and affect.   ASSESSMENT:    1. Hypertension during pregnancy, antepartum, unspecified hypertension in pregnancy type   2. Chest pain, unspecified type    PLAN:    Hypertension in pregnancy-she has suboptimal control of her blood pressure today and has noted higher blood pressures recently.  We will need to optimize her blood pressure considering her current pregnancy.  We will use nifedipine 30 mg long-acting.  I would like to see her back in close follow-up to ensure her blood pressure has not become too low and that we are adequately treating her hypertension.  I have asked her to review this with Dr. Shawnie Pons as well for any additional thoughts.  Dr. Shawnie Pons has also recommended aspirin therapy in pregnancy.  Chest discomfort-no significant episodes of chest discomfort.  And she has not required nitroglycerin.  Given her current pregnancy, we will avoid nitroglycerin use.  If she has episodes of chest discomfort that she would normally take nitro for, I have asked her to contact the office to discuss other options.  She is on labetalol which hopefully will help from an antianginal standpoint if this does not fact represent microvascular dysfunction.  Nifedipine may also help.  Possible palpitations-very infrequent.  We discussed the use of a cardiac event monitor today.  She feels it will not capture her symptoms in 30 days given how infrequent the symptoms are.  We participated in shared decision making.  She will contact me if she would like a cardiac monitor placed.  Total time of encounter: 40 minutes total time of encounter, including 30 minutes spent in face-to-face patient care. This time includes coordination of care and counseling regarding above mentioned problem list. Remainder of non-face-to-face time involved reviewing chart documents/testing  relevant to the patient encounter and documentation in the medical record. I have independently reviewed documentation from referring provider.   Weston Brass, MD [place]  [facility]    Medication Adjustments/Labs and Tests Ordered: Current medicines are reviewed at length with the patient today.  Concerns regarding medicines are outlined above.  No orders of the defined types were placed in this encounter.  No orders of the defined types were placed in this encounter.   There are no Patient Instructions on file for this visit.

## 2019-11-27 NOTE — Patient Instructions (Signed)
Medication Instructions:  Do not take nitroglycerin tablets sublingual  Start Taking Nifedipine 30 mg  One tablet  *If you need a refill on your cardiac medications before your next appointment, please call your pharmacy*  Lab Work: Not needed.  Testing/Procedures: Not needed  Follow-Up: At [facility], you and your health needs are our priority.  As part of our continuing mission to provide you with exceptional heart care, we have created designated Provider Care Teams.  These Care Teams include your primary Cardiologist (physician) and Advanced Practice Providers (APPs -  Physician Assistants and Nurse Practitioners) who all work together to provide you with the care you need, when you need it.  Your next appointment:   3 week(s)  The format for your next appointment:   Virtual Visit   Provider:   Weston Brass, MD  Other Instructions n/a

## 2019-12-12 ENCOUNTER — Telehealth: Payer: Self-pay | Admitting: *Deleted

## 2019-12-12 ENCOUNTER — Telehealth: Payer: Medicaid Other | Admitting: Internal Medicine

## 2019-12-12 NOTE — Telephone Encounter (Signed)
First attempt to contact patient for  Today's virtual visit with Dr Jacques Navy

## 2019-12-12 NOTE — Telephone Encounter (Signed)
3RD ATTEMPT. Patient did not answer - left voicemail - office will call to reschedule virtual appointment with Dr Jacques Navy

## 2019-12-12 NOTE — Telephone Encounter (Signed)
2nd attempt to contact patient. Left  Message on voice mail will cal back @ 5 -10 min

## 2019-12-14 ENCOUNTER — Encounter: Payer: Self-pay | Admitting: Family Medicine

## 2019-12-14 ENCOUNTER — Telehealth: Payer: Medicaid Other | Admitting: Family Medicine

## 2019-12-14 NOTE — Progress Notes (Signed)
Patient did not keep appointment today. She will be called to reschedule.  

## 2019-12-15 ENCOUNTER — Telehealth: Payer: Self-pay | Admitting: *Deleted

## 2019-12-15 ENCOUNTER — Encounter: Payer: Self-pay | Admitting: Internal Medicine

## 2019-12-15 ENCOUNTER — Telehealth (INDEPENDENT_AMBULATORY_CARE_PROVIDER_SITE_OTHER): Payer: Medicaid Other | Admitting: Internal Medicine

## 2019-12-15 VITALS — BP 132/82 | HR 86 | Ht 73.0 in

## 2019-12-15 DIAGNOSIS — Z3A22 22 weeks gestation of pregnancy: Secondary | ICD-10-CM

## 2019-12-15 DIAGNOSIS — Z8632 Personal history of gestational diabetes: Secondary | ICD-10-CM | POA: Diagnosis not present

## 2019-12-15 DIAGNOSIS — R079 Chest pain, unspecified: Secondary | ICD-10-CM | POA: Diagnosis not present

## 2019-12-15 DIAGNOSIS — O162 Unspecified maternal hypertension, second trimester: Secondary | ICD-10-CM | POA: Diagnosis not present

## 2019-12-15 NOTE — Progress Notes (Signed)
Virtual Visit via Telephone Note   This visit type was conducted due to national recommendations for restrictions regarding the COVID-19 Pandemic (e.g. social distancing) in an effort to limit this patient's exposure and mitigate transmission in our community.  Due to her co-morbid illnesses, this patient is at least at moderate risk for complications without adequate follow up.  This format is felt to be most appropriate for this patient at this time.  The patient did not have access to video technology/had technical difficulties with video requiring transitioning to audio format only (telephone).  All issues noted in this document were discussed and addressed.  No physical exam could be performed with this format.  Please refer to the patient's chart for her  consent to telehealth for [facility].   Date:  12/15/2019   ID:  [name], DOB 02-06-1981, MRN [id number]  Patient Location: Home Provider Location: Home  PCP:  Birdie Sons, MD  Cardiologist:  Elouise Munroe, MD  Electrophysiologist:  None   Evaluation Performed:  Follow-Up Visit  Chief Complaint:  F/u HTN  History of Present Illness:    [name] is a 39 y.o. female with current 22 week pregnancy who presents for follow up of HTN, CP and BP management in pregnancy.   Procardia - felt bad with that medicine. LE edema, extreme hunger, fatigue. BP has been in 130s-140s at home, while on labetalol.  Virtual visit with OB next Tuesday, will discuss it further there as well.  The patient does not have symptoms concerning for COVID-19 infection (fever, chills, cough, or new shortness of breath).    Past Medical History:  Diagnosis Date  . Anginal pain ([place])   . Anxiety   . Deaf, left   . Gestational diabetes 2008    glyburide this pregnancy  . History of chicken pox   . Kidney stones 2013   13 passed  . Pregnancy induced hypertension    labetolol  . Preterm labor    delivery   Past Surgical  History:  Procedure Laterality Date  . CESAREAN SECTION  2006  . WISDOM TOOTH EXTRACTION     x4     Current Meds  Medication Sig  . Ascorbic Acid (VITA-C PO) Take by mouth.  Marland Kitchen aspirin EC 81 MG tablet Take 1 tablet (81 mg total) by mouth daily.  Marland Kitchen labetalol (NORMODYNE) 200 MG tablet Take 1 tablet (200 mg total) by mouth every 12 (twelve) hours.  . Multiple Vitamins-Minerals (ZINC PO) Take by mouth.  . nitroGLYCERIN (NITROSTAT) 0.4 MG SL tablet Place 1 tablet (0.4 mg total) under the tongue every 5 (five) minutes as needed for chest pain.  Marland Kitchen VITAMIN D PO Take by mouth.     Allergies:   Ivp dye [iodinated diagnostic agents], Iodine, and Phenergan fortis [promethazine]   Social History   Tobacco Use  . Smoking status: Never Smoker  . Smokeless tobacco: Never Used  Substance Use Topics  . Alcohol use: No    Alcohol/week: 0.0 standard drinks  . Drug use: No     Family Hx: The patient's family history includes Allergies in her sister; Anxiety disorder in her mother; Atrial fibrillation in her mother and paternal grandfather; Depression in her mother; Diabetes in her paternal grandfather; Heart attack in her maternal grandfather and paternal grandfather; Mental illness in her father; Schizophrenia in her maternal grandmother.  ROS:   Please see the history of present illness.     All other systems  reviewed and are negative.   Prior CV studies:   The following studies were reviewed today:    Labs/Other Tests and Data Reviewed:    EKG:  No ECG reviewed.  Recent Labs: 04/18/2019: TSH 1.791 10/10/2019: ALT 12; BUN 10; Creatinine, Ser 0.70; Hemoglobin 11.6; Platelets 230; Potassium 4.1; Sodium 140   Recent Lipid Panel Lab Results  Component Value Date/Time   CHOL 126 04/18/2019 09:37 AM   TRIG 80 04/18/2019 09:37 AM   HDL 38 (L) 04/18/2019 09:37 AM   CHOLHDL 3.3 04/18/2019 09:37 AM   LDLCALC 72 04/18/2019 09:37 AM    Wt Readings from Last 3 Encounters:  11/27/19 (!)  321 lb 12.8 oz (146 kg)  11/06/19 (!) 319 lb (144.7 kg)  10/10/19 (!) 307 lb (139.3 kg)     Objective:    Vital Signs:  BP 132/82   Pulse 86   Ht 6\' 1"  (1.854 m)   LMP 07/17/2019 (LMP Unknown)   BMI 42.46 kg/m    VITAL SIGNS:  reviewed GEN:  no acute distress RESPIRATORY:  normal respiratory effort,  NEURO:  alert and oriented x 3 PSYCH:  normal affect  ASSESSMENT & PLAN:    1. Hypertension during pregnancy, antepartum, unspecified hypertension in pregnancy type   2. Chest pain, unspecified type   3. History of gestational diabetes    HTN in pregnancy - will continue to manage with labetalol. Can uptitrate to 300 mg BID if needed. Did not tolerate nifedipine, will avoid for now. Continue asa 81 mg daily.   CP - aware that she should avoid sublingual nitro while pregnant given lack of data. No strong indication to take nitro, she has been overall well.   COVID-19 Education: The signs and symptoms of COVID-19 were discussed with the patient and how to seek care for testing (follow up with PCP or arrange E-visit).  The importance of social distancing was discussed today.  Time:   Today, I have spent 15 minutes with the patient with telehealth technology discussing the above problems.     Medication Adjustments/Labs and Tests Ordered: Current medicines are reviewed at length with the patient today.  Concerns regarding medicines are outlined above.   Tests Ordered: No orders of the defined types were placed in this encounter.   Medication Changes: No orders of the defined types were placed in this encounter.   Follow Up: 3-4 weeks after next OB exam  Signed, 07/19/2019, MD  12/15/2019 9:26 PM    [facility]

## 2019-12-15 NOTE — Patient Instructions (Addendum)
Medication Instructions:  Discontinue procardia. ( it has been removed from your medication list)   HOLD nitro during pregnancy.   *If you need a refill on your cardiac medications before your next appointment, please call your pharmacy*   Lab Work: Not needed   Testing/Procedures: Not needed  Follow-Up: At [facility], you and your health needs are our priority.  As part of our continuing mission to provide you with exceptional heart care, we have created designated Provider Care Teams.  These Care Teams include your primary Cardiologist (physician) and Advanced Practice Providers (APPs -  Physician Assistants and Nurse Practitioners) who all work together to provide you with the care you need, when you need it.   Your next appointment:   3 week(s)  The format for your next appointment:   Either In Person or Virtual  Provider:   Weston Brass, MD   Other Instructions n/a

## 2019-12-15 NOTE — Telephone Encounter (Signed)
Called patient to no answer , unable to leave message. avs summary sent  Via mychart.  appointment schedule for virtual if patient not available may send message or call to reschedule

## 2019-12-15 NOTE — Telephone Encounter (Signed)
RN spoke to patient. Instruction were given  from today's virtual visit 12/15/19 .  AVS SUMMARY has been sent by mychart . Follow up appointment schedule.   Patient verbalized understanding

## 2019-12-19 ENCOUNTER — Telehealth (INDEPENDENT_AMBULATORY_CARE_PROVIDER_SITE_OTHER): Payer: Medicaid Other | Admitting: Obstetrics and Gynecology

## 2019-12-19 ENCOUNTER — Encounter: Payer: Self-pay | Admitting: Obstetrics and Gynecology

## 2019-12-19 ENCOUNTER — Other Ambulatory Visit: Payer: Self-pay

## 2019-12-19 VITALS — BP 136/90

## 2019-12-19 DIAGNOSIS — O99212 Obesity complicating pregnancy, second trimester: Secondary | ICD-10-CM | POA: Diagnosis not present

## 2019-12-19 DIAGNOSIS — Z98891 History of uterine scar from previous surgery: Secondary | ICD-10-CM

## 2019-12-19 DIAGNOSIS — R8271 Bacteriuria: Secondary | ICD-10-CM | POA: Diagnosis not present

## 2019-12-19 DIAGNOSIS — O09522 Supervision of elderly multigravida, second trimester: Secondary | ICD-10-CM

## 2019-12-19 DIAGNOSIS — Z8632 Personal history of gestational diabetes: Secondary | ICD-10-CM

## 2019-12-19 DIAGNOSIS — Z6841 Body Mass Index (BMI) 40.0 and over, adult: Secondary | ICD-10-CM

## 2019-12-19 DIAGNOSIS — Z3A22 22 weeks gestation of pregnancy: Secondary | ICD-10-CM

## 2019-12-19 DIAGNOSIS — O98812 Other maternal infectious and parasitic diseases complicating pregnancy, second trimester: Secondary | ICD-10-CM

## 2019-12-19 DIAGNOSIS — O10912 Unspecified pre-existing hypertension complicating pregnancy, second trimester: Secondary | ICD-10-CM | POA: Diagnosis not present

## 2019-12-19 DIAGNOSIS — O34219 Maternal care for unspecified type scar from previous cesarean delivery: Secondary | ICD-10-CM | POA: Diagnosis not present

## 2019-12-19 DIAGNOSIS — O0991 Supervision of high risk pregnancy, unspecified, first trimester: Secondary | ICD-10-CM

## 2019-12-19 DIAGNOSIS — O10919 Unspecified pre-existing hypertension complicating pregnancy, unspecified trimester: Secondary | ICD-10-CM

## 2019-12-19 DIAGNOSIS — O99210 Obesity complicating pregnancy, unspecified trimester: Secondary | ICD-10-CM

## 2019-12-19 NOTE — Progress Notes (Signed)
TELEHEALTH VIRTUAL OBSTETRICS VISIT ENCOUNTER NOTE  Clinic: Center for [facility]  I connected with [name] on 12/19/19 at  1:00 PM EST by telephone at home and verified that I am speaking with the correct person using two identifiers.   I discussed the limitations, risks, security and privacy concerns of performing an evaluation and management service by telephone and the availability of in person appointments. I also discussed with the patient that there may be a patient responsible charge related to this service. The patient expressed understanding and agreed to proceed.  Subjective:  [name] is a 39 y.o. [id number] at [redacted]w[redacted]d being followed for ongoing prenatal care.  She is currently monitored for the following issues for this high-risk pregnancy and has Obesity in pregnancy; GERD; Acquired cyst of kidney; Anxiety with flying; Chronic hypertension affecting pregnancy; Chronic fatigue; AMA (advanced maternal age) multigravida 17+; History of VBAC; Vitamin D insufficiency; Obesity in pregnancy, antepartum; Episodic paroxysmal anxiety disorder; Insomnia; Supervision of high risk pregnancy, antepartum, first trimester; Atypical squamous cells cannot exclude high grade squamous intraepithelial lesion on cytologic smear of cervix (ASC-H); Depression, major, single episode; Chest pain; Group B streptococcal bacteriuria; and BMI 40.0-44.9, adult ([place]) on their problem list.  Patient reports no complaints. Reports fetal movement. Denies any contractions, bleeding or leaking of fluid.   The following portions of the patient's history were reviewed and updated as appropriate: allergies, current medications, past family history, past medical history, past social history, past surgical history and problem list.   Objective:   Vitals:   12/19/19 1312  BP: 136/90    Babyscripts Data Reviewed: not applicable  General:  Alert, oriented and cooperative.   Mental  Status: Normal mood and affect perceived. Normal judgment and thought content.  Rest of physical exam deferred due to type of encounter  Assessment and Plan:  Pregnancy: [id number] at [redacted]w[redacted]d 1. Group B streptococcal bacteriuria tx in labor  2. Chronic hypertension affecting pregnancy Followed by cardiology. Only on labetalol bid 200mg  and not with procardia. Confirms low dose asa  3. Multigravida of advanced maternal age in second trimester No issues  4. History of VBAC Needs formal consent  5. Obesity in pregnancy, antepartum Watch weight  6. Supervision of high risk pregnancy, antepartum, first trimester Routine care. I d/w her re: BTL and paperwork, etc. Pt considering. I also told her that whether we can do it PP will be based on surgeon preference her exam after she delivers.   Preterm labor symptoms and general obstetric precautions including but not limited to vaginal bleeding, contractions, leaking of fluid and fetal movement were reviewed in detail with the patient.  I discussed the assessment and treatment plan with the patient. The patient was provided an opportunity to ask questions and all were answered. The patient agreed with the plan and demonstrated an understanding of the instructions. The patient was advised to call back or seek an in-person office evaluation/go to [facility] at [facility] for any urgent or concerning symptoms. Please refer to After Visit Summary for other counseling recommendations.   I provided 10 minutes of non-face-to-face time during this encounter. The visit was conducted via MyChart-medicine  No follow-ups on file.  Future Appointments  Date Time Provider [place]  12/25/2019 11:00 AM [facility]  12/25/2019 11:00 AM [place] [facility]  01/05/2020  9:20 AM Elouise Munroe, MD [facility]  01/15/2020  3:00 PM Donnamae Jude, MD [facility]  [facility]    [name] Bing, MD Center for [employer], [facility]

## 2019-12-19 NOTE — Progress Notes (Signed)
I connected with  Lise Auer Marceaux on 12/19/19 at  1:00 PM EST by telephone and verified that I am speaking with the correct person using two identifiers.   I discussed the limitations, risks, security and privacy concerns of performing an evaluation and management service by telephone and the availability of in person appointments. I also discussed with the patient that there may be a patient responsible charge related to this service. The patient expressed understanding and agreed to proceed.  Scheryl Marten, RN 12/19/2019  1:13 PM    Took BP this Am prior to taking BP meds

## 2019-12-25 ENCOUNTER — Ambulatory Visit (HOSPITAL_COMMUNITY)
Admission: RE | Admit: 2019-12-25 | Discharge: 2019-12-25 | Disposition: A | Discharge status: Home / Self Care | Payer: Medicaid Other | Source: Ambulatory Visit | Attending: Obstetrics and Gynecology | Admitting: Obstetrics and Gynecology

## 2019-12-25 ENCOUNTER — Ambulatory Visit (HOSPITAL_COMMUNITY): Payer: Medicaid Other | Admitting: *Deleted

## 2019-12-25 ENCOUNTER — Encounter (HOSPITAL_COMMUNITY): Payer: Self-pay

## 2019-12-25 ENCOUNTER — Other Ambulatory Visit (HOSPITAL_COMMUNITY): Payer: Self-pay | Admitting: *Deleted

## 2019-12-25 ENCOUNTER — Other Ambulatory Visit: Payer: Self-pay

## 2019-12-25 VITALS — BP 132/72 | HR 86 | Temp 97.6°F

## 2019-12-25 DIAGNOSIS — R8271 Bacteriuria: Secondary | ICD-10-CM | POA: Insufficient documentation

## 2019-12-25 DIAGNOSIS — O10919 Unspecified pre-existing hypertension complicating pregnancy, unspecified trimester: Secondary | ICD-10-CM | POA: Diagnosis not present

## 2019-12-25 DIAGNOSIS — O09522 Supervision of elderly multigravida, second trimester: Secondary | ICD-10-CM | POA: Diagnosis not present

## 2019-12-25 DIAGNOSIS — Z362 Encounter for other antenatal screening follow-up: Secondary | ICD-10-CM

## 2019-12-25 DIAGNOSIS — O09529 Supervision of elderly multigravida, unspecified trimester: Secondary | ICD-10-CM | POA: Insufficient documentation

## 2019-12-25 DIAGNOSIS — Z3A22 22 weeks gestation of pregnancy: Secondary | ICD-10-CM

## 2019-12-26 ENCOUNTER — Other Ambulatory Visit: Payer: Self-pay | Admitting: Family Medicine

## 2020-01-05 ENCOUNTER — Telehealth: Payer: Medicaid Other | Admitting: Internal Medicine

## 2020-01-10 ENCOUNTER — Encounter: Payer: Self-pay | Admitting: Internal Medicine

## 2020-01-15 ENCOUNTER — Other Ambulatory Visit: Payer: Self-pay

## 2020-01-15 ENCOUNTER — Ambulatory Visit (INDEPENDENT_AMBULATORY_CARE_PROVIDER_SITE_OTHER): Payer: Medicaid Other | Admitting: Family Medicine

## 2020-01-15 VITALS — BP 143/89 | HR 74 | Wt 324.0 lb

## 2020-01-15 DIAGNOSIS — O10912 Unspecified pre-existing hypertension complicating pregnancy, second trimester: Secondary | ICD-10-CM

## 2020-01-15 DIAGNOSIS — O0991 Supervision of high risk pregnancy, unspecified, first trimester: Secondary | ICD-10-CM

## 2020-01-15 DIAGNOSIS — Z3A26 26 weeks gestation of pregnancy: Secondary | ICD-10-CM

## 2020-01-15 DIAGNOSIS — O09522 Supervision of elderly multigravida, second trimester: Secondary | ICD-10-CM

## 2020-01-15 DIAGNOSIS — Z98891 History of uterine scar from previous surgery: Secondary | ICD-10-CM

## 2020-01-15 DIAGNOSIS — O10919 Unspecified pre-existing hypertension complicating pregnancy, unspecified trimester: Secondary | ICD-10-CM

## 2020-01-15 DIAGNOSIS — O99210 Obesity complicating pregnancy, unspecified trimester: Secondary | ICD-10-CM

## 2020-01-15 DIAGNOSIS — O99212 Obesity complicating pregnancy, second trimester: Secondary | ICD-10-CM

## 2020-01-15 NOTE — Progress Notes (Signed)
   PRENATAL VISIT NOTE  Subjective:  [name] is a 39 y.o. [id number] at [redacted]w[redacted]d being seen today for ongoing prenatal care.  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  Patient reports no complaints.  Contractions: Not present.  .  Movement: Present. Denies leaking of fluid.   The following portions of the patient's history were reviewed and updated as appropriate: allergies, current medications, past family history, past medical history, past social history, past surgical history and problem list.   Objective:   Vitals:   01/15/20 1512  BP: (!) 143/89  Pulse: 74  Weight: (!) 324 lb (147 kg)    Fetal Status: Fetal Heart Rate (bpm): 154   Movement: Present     General:  Alert, oriented and cooperative. Patient is in no acute distress.  Skin: Skin is warm and dry. No rash noted.   Cardiovascular: Normal heart rate noted  Respiratory: Normal respiratory effort, no problems with respiration noted  Abdomen: Soft, gravid, appropriate for gestational age.  Pain/Pressure: Absent     Pelvic: Cervical exam deferred        Extremities: Normal range of motion.  Edema: None  Mental Status: Normal mood and affect. Normal behavior. Normal judgment and thought content.   Assessment and Plan:  Pregnancy: [id number] at [redacted]w[redacted]d 1. Chronic  hypertension affecting pregnancy BP is ok--On labetalol Will decline continue growth scans with MFM  2. Multigravida of advanced maternal age in second trimester Declines genetics  3. History of VBAC Desires VBAC again. Consent signed today  4. Supervision of high risk pregnancy, antepartum, first trimester 28 wk labs--declines 2 hour--will check CBGs--declines TDaP Fasting CBGS are in the 80s. 2 hour pp 80s. - CBC - Hemoglobin A1c - RPR - HIV Antibody (routine testing w rflx)  5. Obesity in pregnancy, antepartum   Preterm labor symptoms and general obstetric precautions including but not limited to vaginal bleeding, contractions, leaking of fluid and fetal movement were reviewed in detail with the patient. Please refer to After Visit Summary for other counseling recommendations.   Return in 2 weeks (on 01/29/2020).  Future Appointments  Date Time Provider Department Center  01/18/2020  9:40 AM Parke Poisson, MD [facility]  01/23/2020 11:00 AM [facility] NURSE [facility]  01/23/2020 11:00 AM [facility] [place] 3 [facility]  01/30/2020 10:15 AM Reva Bores, MD [facility]    Reva Bores, MD

## 2020-01-15 NOTE — Patient Instructions (Signed)
 Breastfeeding  Choosing to breastfeed is one of the [place] decisions you can make for yourself and your baby. A change in hormones during pregnancy causes your breasts to make breast milk in your milk-producing glands. Hormones prevent breast milk from being released before your baby is born. They also prompt milk flow after birth. Once breastfeeding has begun, thoughts of your baby, as well as his or her sucking or crying, can stimulate the release of milk from your milk-producing glands. Benefits of breastfeeding Research shows that breastfeeding offers many health benefits for infants and mothers. It also offers a cost-free and convenient way to feed your baby. For your baby  Your first milk (colostrum) helps your baby's digestive system to function better.  Special cells in your milk (antibodies) help your baby to fight off infections.  Breastfed babies are less likely to develop asthma, allergies, obesity, or type 2 diabetes. They are also at lower risk for sudden infant death syndrome (SIDS).  Nutrients in breast milk are better able to meet your baby's needs compared to infant formula.  Breast milk improves your baby's brain development. For you  Breastfeeding helps to create a very special bond between you and your baby.  Breastfeeding is convenient. Breast milk costs nothing and is always available at the correct temperature.  Breastfeeding helps to burn calories. It helps you to lose the weight that you gained during pregnancy.  Breastfeeding makes your uterus return faster to its size before pregnancy. It also slows bleeding (lochia) after you give birth.  Breastfeeding helps to lower your risk of developing type 2 diabetes, osteoporosis, rheumatoid arthritis, cardiovascular disease, and breast, ovarian, uterine, and endometrial cancer later in life. Breastfeeding basics Starting breastfeeding  Find a comfortable place to sit or lie down, with your neck and back  well-supported.  Place a pillow or a rolled-up blanket under your baby to bring him or her to the level of your breast (if you are seated). Nursing pillows are specially designed to help support your arms and your baby while you breastfeed.  Make sure that your baby's tummy (abdomen) is facing your abdomen.  Gently massage your breast. With your fingertips, massage from the outer edges of your breast inward toward the nipple. This encourages milk flow. If your milk flows slowly, you may need to continue this action during the feeding.  Support your breast with 4 fingers underneath and your thumb above your nipple (make the letter "C" with your hand). Make sure your fingers are well away from your nipple and your baby's mouth.  Stroke your baby's lips gently with your finger or nipple.  When your baby's mouth is open wide enough, quickly bring your baby to your breast, placing your entire nipple and as much of the areola as possible into your baby's mouth. The areola is the colored area around your nipple. ? More areola should be visible above your baby's upper lip than below the lower lip. ? Your baby's lips should be opened and extended outward (flanged) to ensure an adequate, comfortable latch. ? Your baby's tongue should be between his or her lower gum and your breast.  Make sure that your baby's mouth is correctly positioned around your nipple (latched). Your baby's lips should create a seal on your breast and be turned out (everted).  It is common for your baby to suck about 2-3 minutes in order to start the flow of breast milk. Latching Teaching your baby how to latch onto your breast properly   is very important. An improper latch can cause nipple pain, decreased milk supply, and poor weight gain in your baby. Also, if your baby is not latched onto your nipple properly, he or she may swallow some air during feeding. This can make your baby fussy. Burping your baby when you switch breasts  during the feeding can help to get rid of the air. However, teaching your baby to latch on properly is still the [place] way to prevent fussiness from swallowing air while breastfeeding. Signs that your baby has successfully latched onto your nipple  Silent tugging or silent sucking, without causing you pain. Infant's lips should be extended outward (flanged).  Swallowing heard between every 3-4 sucks once your milk has started to flow (after your let-down milk reflex occurs).  Muscle movement above and in front of his or her ears while sucking. Signs that your baby has not successfully latched onto your nipple  Sucking sounds or smacking sounds from your baby while breastfeeding.  Nipple pain. If you think your baby has not latched on correctly, slip your finger into the corner of your baby's mouth to break the suction and place it between your baby's gums. Attempt to start breastfeeding again. Signs of successful breastfeeding Signs from your baby  Your baby will gradually decrease the number of sucks or will completely stop sucking.  Your baby will fall asleep.  Your baby's body will relax.  Your baby will retain a small amount of milk in his or her mouth.  Your baby will let go of your breast by himself or herself. Signs from you  Breasts that have increased in firmness, weight, and size 1-3 hours after feeding.  Breasts that are softer immediately after breastfeeding.  Increased milk volume, as well as a change in milk consistency and color by the fifth day of breastfeeding.  Nipples that are not sore, cracked, or bleeding. Signs that your baby is getting enough milk  Wetting at least 1-2 diapers during the first 24 hours after birth.  Wetting at least 5-6 diapers every 24 hours for the first week after birth. The urine should be clear or pale yellow by the age of 5 days.  Wetting 6-8 diapers every 24 hours as your baby continues to grow and develop.  At least 3 stools in  a 24-hour period by the age of 5 days. The stool should be soft and yellow.  At least 3 stools in a 24-hour period by the age of 7 days. The stool should be seedy and yellow.  No loss of weight greater than 10% of birth weight during the first 3 days of life.  Average weight gain of 4-7 oz (113-198 g) per week after the age of 4 days.  Consistent daily weight gain by the age of 5 days, without weight loss after the age of 2 weeks. After a feeding, your baby may spit up a small amount of milk. This is normal. Breastfeeding frequency and duration Frequent feeding will help you make more milk and can prevent sore nipples and extremely full breasts (breast engorgement). Breastfeed when you feel the need to reduce the fullness of your breasts or when your baby shows signs of hunger. This is called "breastfeeding on demand." Signs that your baby is hungry include:  Increased alertness, activity, or restlessness.  Movement of the head from side to side.  Opening of the mouth when the corner of the mouth or cheek is stroked (rooting).  Increased sucking sounds, smacking lips,   cooing, sighing, or squeaking.  Hand-to-mouth movements and sucking on fingers or hands.  Fussing or crying. Avoid introducing a pacifier to your baby in the first 4-6 weeks after your baby is born. After this time, you may choose to use a pacifier. Research has shown that pacifier use during the first year of a baby's life decreases the risk of sudden infant death syndrome (SIDS). Allow your baby to feed on each breast as long as he or she wants. When your baby unlatches or falls asleep while feeding from the first breast, offer the second breast. Because newborns are often sleepy in the first few weeks of life, you may need to awaken your baby to get him or her to feed. Breastfeeding times will vary from baby to baby. However, the following rules can serve as a guide to help you make sure that your baby is properly  fed:  Newborns (babies 4 weeks of age or younger) may breastfeed every 1-3 hours.  Newborns should not go without breastfeeding for longer than 3 hours during the day or 5 hours during the night.  You should breastfeed your baby a minimum of 8 times in a 24-hour period. Breast milk pumping     Pumping and storing breast milk allows you to make sure that your baby is exclusively fed your breast milk, even at times when you are unable to breastfeed. This is especially important if you go back to work while you are still breastfeeding, or if you are not able to be present during feedings. Your lactation consultant can help you find a method of pumping that works [place] for you and give you guidelines about how long it is safe to store breast milk. Caring for your breasts while you breastfeed Nipples can become dry, cracked, and sore while breastfeeding. The following recommendations can help keep your breasts moisturized and healthy:  Avoid using soap on your nipples.  Wear a supportive bra designed especially for nursing. Avoid wearing underwire-style bras or extremely tight bras (sports bras).  Air-dry your nipples for 3-4 minutes after each feeding.  Use only cotton bra pads to absorb leaked breast milk. Leaking of breast milk between feedings is normal.  Use lanolin on your nipples after breastfeeding. Lanolin helps to maintain your skin's normal moisture barrier. Pure lanolin is not harmful (not toxic) to your baby. You may also hand express a few drops of breast milk and gently massage that milk into your nipples and allow the milk to air-dry. In the first few weeks after giving birth, some women experience breast engorgement. Engorgement can make your breasts feel heavy, warm, and tender to the touch. Engorgement peaks within 3-5 days after you give birth. The following recommendations can help to ease engorgement:  Completely empty your breasts while breastfeeding or pumping. You may  want to start by applying warm, moist heat (in the shower or with warm, water-soaked hand towels) just before feeding or pumping. This increases circulation and helps the milk flow. If your baby does not completely empty your breasts while breastfeeding, pump any extra milk after he or she is finished.  Apply ice packs to your breasts immediately after breastfeeding or pumping, unless this is too uncomfortable for you. To do this: ? Put ice in a plastic bag. ? Place a towel between your skin and the bag. ? Leave the ice on for 20 minutes, 2-3 times a day.  Make sure that your baby is latched on and positioned properly while breastfeeding.   If engorgement persists after 48 hours of following these recommendations, contact your health care provider or a lactation consultant. Overall health care recommendations while breastfeeding  Eat 3 healthy meals and 3 snacks every day. Well-nourished mothers who are breastfeeding need an additional 450-500 calories a day. You can meet this requirement by increasing the amount of a balanced diet that you eat.  Drink enough water to keep your urine pale yellow or clear.  Rest often, relax, and continue to take your prenatal vitamins to prevent fatigue, stress, and low vitamin and mineral levels in your body (nutrient deficiencies).  Do not use any products that contain nicotine or tobacco, such as cigarettes and e-cigarettes. Your baby may be harmed by chemicals from cigarettes that pass into breast milk and exposure to secondhand smoke. If you need help quitting, ask your health care provider.  Avoid alcohol.  Do not use illegal drugs or marijuana.  Talk with your health care provider before taking any medicines. These include over-the-counter and prescription medicines as well as vitamins and herbal supplements. Some medicines that may be harmful to your baby can pass through breast milk.  It is possible to become pregnant while breastfeeding. If birth  control is desired, ask your health care provider about options that will be safe while breastfeeding your baby. Where to find more information: [employer]: www.llli.org Contact a health care provider if:  You feel like you want to stop breastfeeding or have become frustrated with breastfeeding.  Your nipples are cracked or bleeding.  Your breasts are red, tender, or warm.  You have: ? Painful breasts or nipples. ? A swollen area on either breast. ? A fever or chills. ? Nausea or vomiting. ? Drainage other than breast milk from your nipples.  Your breasts do not become full before feedings by the fifth day after you give birth.  You feel sad and depressed.  Your baby is: ? Too sleepy to eat well. ? Having trouble sleeping. ? More than 1 week old and wetting fewer than 6 diapers in a 24-hour period. ? Not gaining weight by 5 days of age.  Your baby has fewer than 3 stools in a 24-hour period.  Your baby's skin or the white parts of his or her eyes become yellow. Get help right away if:  Your baby is overly tired (lethargic) and does not want to wake up and feed.  Your baby develops an unexplained fever. Summary  Breastfeeding offers many health benefits for infant and mothers.  Try to breastfeed your infant when he or she shows early signs of hunger.  Gently tickle or stroke your baby's lips with your finger or nipple to allow the baby to open his or her mouth. Bring the baby to your breast. Make sure that much of the areola is in your baby's mouth. Offer one side and burp the baby before you offer the other side.  Talk with your health care provider or lactation consultant if you have questions or you face problems as you breastfeed. This information is not intended to replace advice given to you by your health care provider. Make sure you discuss any questions you have with your health care provider. Document Revised: 12/30/2017 Document Reviewed:  11/06/2016 Elsevier Patient Education  2020 Elsevier Inc.  

## 2020-01-16 LAB — HEMOGLOBIN A1C
Est. average glucose Bld gHb Est-mCnc: 105 mg/dL
Hgb A1c MFr Bld: 5.3 % (ref 4.8–5.6)

## 2020-01-16 LAB — CBC
Hematocrit: 32.9 % — ABNORMAL LOW (ref 34.0–46.6)
Hemoglobin: 10.8 g/dL — ABNORMAL LOW (ref 11.1–15.9)
MCH: 27 pg (ref 26.6–33.0)
MCHC: 32.8 g/dL (ref 31.5–35.7)
MCV: 82 fL (ref 79–97)
Platelets: 194 10*3/uL (ref 150–450)
RBC: 4 x10E6/uL (ref 3.77–5.28)
RDW: 14.1 % (ref 11.7–15.4)
WBC: 8.3 10*3/uL (ref 3.4–10.8)

## 2020-01-16 LAB — HIV ANTIBODY (ROUTINE TESTING W REFLEX): HIV Screen 4th Generation wRfx: NONREACTIVE

## 2020-01-16 LAB — RPR: RPR Ser Ql: NONREACTIVE

## 2020-01-18 ENCOUNTER — Telehealth: Payer: Medicaid Other | Admitting: Internal Medicine

## 2020-01-23 ENCOUNTER — Ambulatory Visit (HOSPITAL_COMMUNITY): Payer: Medicaid Other

## 2020-01-23 ENCOUNTER — Ambulatory Visit (HOSPITAL_COMMUNITY): Payer: Medicaid Other | Attending: Obstetrics and Gynecology

## 2020-01-30 ENCOUNTER — Other Ambulatory Visit: Payer: Self-pay

## 2020-01-30 ENCOUNTER — Telehealth (INDEPENDENT_AMBULATORY_CARE_PROVIDER_SITE_OTHER): Payer: Medicaid Other | Admitting: Family Medicine

## 2020-01-30 ENCOUNTER — Encounter: Payer: Self-pay | Admitting: Family Medicine

## 2020-01-30 VITALS — BP 134/76 | HR 90

## 2020-01-30 DIAGNOSIS — O10913 Unspecified pre-existing hypertension complicating pregnancy, third trimester: Secondary | ICD-10-CM

## 2020-01-30 DIAGNOSIS — Z3A28 28 weeks gestation of pregnancy: Secondary | ICD-10-CM

## 2020-01-30 DIAGNOSIS — O0991 Supervision of high risk pregnancy, unspecified, first trimester: Secondary | ICD-10-CM

## 2020-01-30 DIAGNOSIS — O0993 Supervision of high risk pregnancy, unspecified, third trimester: Secondary | ICD-10-CM

## 2020-01-30 DIAGNOSIS — O99343 Other mental disorders complicating pregnancy, third trimester: Secondary | ICD-10-CM

## 2020-01-30 DIAGNOSIS — O10919 Unspecified pre-existing hypertension complicating pregnancy, unspecified trimester: Secondary | ICD-10-CM

## 2020-01-30 DIAGNOSIS — Z98891 History of uterine scar from previous surgery: Secondary | ICD-10-CM

## 2020-01-30 DIAGNOSIS — F321 Major depressive disorder, single episode, moderate: Secondary | ICD-10-CM

## 2020-01-30 DIAGNOSIS — O09523 Supervision of elderly multigravida, third trimester: Secondary | ICD-10-CM

## 2020-01-30 NOTE — Progress Notes (Signed)
OBSTETRICS PRENATAL VIRTUAL VISIT ENCOUNTER NOTE  Provider location: [facility] for [facility]   I connected with [name] on 01/30/20 at 10:15 AM EDT by MyChart Video Encounter at home and verified that I am speaking with the correct person using two identifiers.   I discussed the limitations, risks, security and privacy concerns of performing an evaluation and management service virtually and the availability of in person appointments. I also discussed with the patient that there may be a patient responsible charge related to this service. The patient expressed understanding and agreed to proceed. Subjective:  [name] is a 39 y.o. [id number] at [redacted]w[redacted]d being seen today for ongoing prenatal care.  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  Patient reports no complaints.  Contractions: Not present.  .  Movement: Present. Denies any leaking of fluid.   The following portions of the patient's history were reviewed and updated as appropriate: allergies, current medications, past family history, past medical history, past social history, past surgical history and problem list.   Objective:   Vitals:   01/30/20 1021  BP: 134/76  Pulse: 90    Fetal Status:     Movement: Present     General:  Alert,  oriented and cooperative. Patient is in no acute distress.  Respiratory: Normal respiratory effort, no problems with respiration noted  Mental Status: Normal mood and affect. Normal behavior. Normal judgment and thought content.  Rest of physical exam deferred due to type of encounter   Assessment and Plan:  Pregnancy: [id number] at [redacted]w[redacted]d 1. Chronic hypertension affecting pregnancy Continue Labetalol and Procardia and ASA as she remembers to take it BP is well controlled 137/74 Notes for mood she has low energy, but her mood feels fine.  2. Multigravida of advanced maternal age in third trimester Declines genetics  3. Supervision of high risk pregnancy, antepartum, first trimester   4. History of VBAC Signed Consent, for Repeat VBAC  5. Current moderate episode of major depressive disorder without prior episode (HCC) Mood is ok, except for low energy--off Wellbutrin  6. History of Gestational Diabetes Declines formal testing, checks at home CBGs fasting 87, 84, 91 2 hour < 85  Preterm labor symptoms and general obstetric precautions including but not limited to vaginal bleeding, contractions, leaking of fluid and fetal movement were reviewed in detail with the patient. I discussed the assessment and treatment plan with the patient. The patient was provided an opportunity to ask questions and all were answered. The patient agreed with the plan and demonstrated an understanding of the instructions. The patient was advised to call back or seek an in-person office evaluation/go to [facility] at [facility] for any urgent or concerning symptoms. Please refer to After Visit Summary for other counseling recommendations.   I provided 11 minutes of face-to-face time during this encounter.  Return in 2 weeks (on 02/13/2020) for virtual, 4 wks with OB visit and BPP if can be done  here.  Future Appointments  Date Time Provider Department Center  02/27/2020 11:00 AM Donnamae Jude, MD  [facility]    Donnamae Jude, [place] [facility] [employer]

## 2020-01-30 NOTE — Patient Instructions (Signed)
 Breastfeeding  Choosing to breastfeed is one of the [place] decisions you can make for yourself and your baby. A change in hormones during pregnancy causes your breasts to make breast milk in your milk-producing glands. Hormones prevent breast milk from being released before your baby is born. They also prompt milk flow after birth. Once breastfeeding has begun, thoughts of your baby, as well as his or her sucking or crying, can stimulate the release of milk from your milk-producing glands. Benefits of breastfeeding Research shows that breastfeeding offers many health benefits for infants and mothers. It also offers a cost-free and convenient way to feed your baby. For your baby  Your first milk (colostrum) helps your baby's digestive system to function better.  Special cells in your milk (antibodies) help your baby to fight off infections.  Breastfed babies are less likely to develop asthma, allergies, obesity, or type 2 diabetes. They are also at lower risk for sudden infant death syndrome (SIDS).  Nutrients in breast milk are better able to meet your baby's needs compared to infant formula.  Breast milk improves your baby's brain development. For you  Breastfeeding helps to create a very special bond between you and your baby.  Breastfeeding is convenient. Breast milk costs nothing and is always available at the correct temperature.  Breastfeeding helps to burn calories. It helps you to lose the weight that you gained during pregnancy.  Breastfeeding makes your uterus return faster to its size before pregnancy. It also slows bleeding (lochia) after you give birth.  Breastfeeding helps to lower your risk of developing type 2 diabetes, osteoporosis, rheumatoid arthritis, cardiovascular disease, and breast, ovarian, uterine, and endometrial cancer later in life. Breastfeeding basics Starting breastfeeding  Find a comfortable place to sit or lie down, with your neck and back  well-supported.  Place a pillow or a rolled-up blanket under your baby to bring him or her to the level of your breast (if you are seated). Nursing pillows are specially designed to help support your arms and your baby while you breastfeed.  Make sure that your baby's tummy (abdomen) is facing your abdomen.  Gently massage your breast. With your fingertips, massage from the outer edges of your breast inward toward the nipple. This encourages milk flow. If your milk flows slowly, you may need to continue this action during the feeding.  Support your breast with 4 fingers underneath and your thumb above your nipple (make the letter "C" with your hand). Make sure your fingers are well away from your nipple and your baby's mouth.  Stroke your baby's lips gently with your finger or nipple.  When your baby's mouth is open wide enough, quickly bring your baby to your breast, placing your entire nipple and as much of the areola as possible into your baby's mouth. The areola is the colored area around your nipple. ? More areola should be visible above your baby's upper lip than below the lower lip. ? Your baby's lips should be opened and extended outward (flanged) to ensure an adequate, comfortable latch. ? Your baby's tongue should be between his or her lower gum and your breast.  Make sure that your baby's mouth is correctly positioned around your nipple (latched). Your baby's lips should create a seal on your breast and be turned out (everted).  It is common for your baby to suck about 2-3 minutes in order to start the flow of breast milk. Latching Teaching your baby how to latch onto your breast properly   is very important. An improper latch can cause nipple pain, decreased milk supply, and poor weight gain in your baby. Also, if your baby is not latched onto your nipple properly, he or she may swallow some air during feeding. This can make your baby fussy. Burping your baby when you switch breasts  during the feeding can help to get rid of the air. However, teaching your baby to latch on properly is still the Job way to prevent fussiness from swallowing air while breastfeeding. Signs that your baby has successfully latched onto your nipple  Silent tugging or silent sucking, without causing you pain. Infant's lips should be extended outward (flanged).  Swallowing heard between every 3-4 sucks once your milk has started to flow (after your let-down milk reflex occurs).  Muscle movement above and in front of his or her ears while sucking. Signs that your baby has not successfully latched onto your nipple  Sucking sounds or smacking sounds from your baby while breastfeeding.  Nipple pain. If you think your baby has not latched on correctly, slip your finger into the corner of your baby's mouth to break the suction and place it between your baby's gums. Attempt to start breastfeeding again. Signs of successful breastfeeding Signs from your baby  Your baby will gradually decrease the number of sucks or will completely stop sucking.  Your baby will fall asleep.  Your baby's body will relax.  Your baby will retain a small amount of milk in his or her mouth.  Your baby will let go of your breast by himself or herself. Signs from you  Breasts that have increased in firmness, weight, and size 1-3 hours after feeding.  Breasts that are softer immediately after breastfeeding.  Increased milk volume, as well as a change in milk consistency and color by the fifth day of breastfeeding.  Nipples that are not sore, cracked, or bleeding. Signs that your baby is getting enough milk  Wetting at least 1-2 diapers during the first 24 hours after birth.  Wetting at least 5-6 diapers every 24 hours for the first week after birth. The urine should be clear or pale yellow by the age of 5 days.  Wetting 6-8 diapers every 24 hours as your baby continues to grow and develop.  At least 3 stools in  a 24-hour period by the age of 5 days. The stool should be soft and yellow.  At least 3 stools in a 24-hour period by the age of 7 days. The stool should be seedy and yellow.  No loss of weight greater than 10% of birth weight during the first 3 days of life.  Average weight gain of 4-7 oz (113-198 g) per week after the age of 4 days.  Consistent daily weight gain by the age of 5 days, without weight loss after the age of 2 weeks. After a feeding, your baby may spit up a small amount of milk. This is normal. Breastfeeding frequency and duration Frequent feeding will help you make more milk and can prevent sore nipples and extremely full breasts (breast engorgement). Breastfeed when you feel the need to reduce the fullness of your breasts or when your baby shows signs of hunger. This is called "breastfeeding on demand." Signs that your baby is hungry include:  Increased alertness, activity, or restlessness.  Movement of the head from side to side.  Opening of the mouth when the corner of the mouth or cheek is stroked (rooting).  Increased sucking sounds, smacking lips,   cooing, sighing, or squeaking.  Hand-to-mouth movements and sucking on fingers or hands.  Fussing or crying. Avoid introducing a pacifier to your baby in the first 4-6 weeks after your baby is born. After this time, you may choose to use a pacifier. Research has shown that pacifier use during the first year of a baby's life decreases the risk of sudden infant death syndrome (SIDS). Allow your baby to feed on each breast as long as he or she wants. When your baby unlatches or falls asleep while feeding from the first breast, offer the second breast. Because newborns are often sleepy in the first few weeks of life, you may need to awaken your baby to get him or her to feed. Breastfeeding times will vary from baby to baby. However, the following rules can serve as a guide to help you make sure that your baby is properly  fed:  Newborns (babies 4 weeks of age or younger) may breastfeed every 1-3 hours.  Newborns should not go without breastfeeding for longer than 3 hours during the day or 5 hours during the night.  You should breastfeed your baby a minimum of 8 times in a 24-hour period. Breast milk pumping     Pumping and storing breast milk allows you to make sure that your baby is exclusively fed your breast milk, even at times when you are unable to breastfeed. This is especially important if you go back to work while you are still breastfeeding, or if you are not able to be present during feedings. Your lactation consultant can help you find a method of pumping that works [place] for you and give you guidelines about how long it is safe to store breast milk. Caring for your breasts while you breastfeed Nipples can become dry, cracked, and sore while breastfeeding. The following recommendations can help keep your breasts moisturized and healthy:  Avoid using soap on your nipples.  Wear a supportive bra designed especially for nursing. Avoid wearing underwire-style bras or extremely tight bras (sports bras).  Air-dry your nipples for 3-4 minutes after each feeding.  Use only cotton bra pads to absorb leaked breast milk. Leaking of breast milk between feedings is normal.  Use lanolin on your nipples after breastfeeding. Lanolin helps to maintain your skin's normal moisture barrier. Pure lanolin is not harmful (not toxic) to your baby. You may also hand express a few drops of breast milk and gently massage that milk into your nipples and allow the milk to air-dry. In the first few weeks after giving birth, some women experience breast engorgement. Engorgement can make your breasts feel heavy, warm, and tender to the touch. Engorgement peaks within 3-5 days after you give birth. The following recommendations can help to ease engorgement:  Completely empty your breasts while breastfeeding or pumping. You may  want to start by applying warm, moist heat (in the shower or with warm, water-soaked hand towels) just before feeding or pumping. This increases circulation and helps the milk flow. If your baby does not completely empty your breasts while breastfeeding, pump any extra milk after he or she is finished.  Apply ice packs to your breasts immediately after breastfeeding or pumping, unless this is too uncomfortable for you. To do this: ? Put ice in a plastic bag. ? Place a towel between your skin and the bag. ? Leave the ice on for 20 minutes, 2-3 times a day.  Make sure that your baby is latched on and positioned properly while breastfeeding.   If engorgement persists after 48 hours of following these recommendations, contact your health care provider or a lactation consultant. Overall health care recommendations while breastfeeding  Eat 3 healthy meals and 3 snacks every day. Well-nourished mothers who are breastfeeding need an additional 450-500 calories a day. You can meet this requirement by increasing the amount of a balanced diet that you eat.  Drink enough water to keep your urine pale yellow or clear.  Rest often, relax, and continue to take your prenatal vitamins to prevent fatigue, stress, and low vitamin and mineral levels in your body (nutrient deficiencies).  Do not use any products that contain nicotine or tobacco, such as cigarettes and e-cigarettes. Your baby may be harmed by chemicals from cigarettes that pass into breast milk and exposure to secondhand smoke. If you need help quitting, ask your health care provider.  Avoid alcohol.  Do not use illegal drugs or marijuana.  Talk with your health care provider before taking any medicines. These include over-the-counter and prescription medicines as well as vitamins and herbal supplements. Some medicines that may be harmful to your baby can pass through breast milk.  It is possible to become pregnant while breastfeeding. If birth  control is desired, ask your health care provider about options that will be safe while breastfeeding your baby. Where to find more information: [employer]: www.llli.org Contact a health care provider if:  You feel like you want to stop breastfeeding or have become frustrated with breastfeeding.  Your nipples are cracked or bleeding.  Your breasts are red, tender, or warm.  You have: ? Painful breasts or nipples. ? A swollen area on either breast. ? A fever or chills. ? Nausea or vomiting. ? Drainage other than breast milk from your nipples.  Your breasts do not become full before feedings by the fifth day after you give birth.  You feel sad and depressed.  Your baby is: ? Too sleepy to eat well. ? Having trouble sleeping. ? More than 1 week old and wetting fewer than 6 diapers in a 24-hour period. ? Not gaining weight by 5 days of age.  Your baby has fewer than 3 stools in a 24-hour period.  Your baby's skin or the white parts of his or her eyes become yellow. Get help right away if:  Your baby is overly tired (lethargic) and does not want to wake up and feed.  Your baby develops an unexplained fever. Summary  Breastfeeding offers many health benefits for infant and mothers.  Try to breastfeed your infant when he or she shows early signs of hunger.  Gently tickle or stroke your baby's lips with your finger or nipple to allow the baby to open his or her mouth. Bring the baby to your breast. Make sure that much of the areola is in your baby's mouth. Offer one side and burp the baby before you offer the other side.  Talk with your health care provider or lactation consultant if you have questions or you face problems as you breastfeed. This information is not intended to replace advice given to you by your health care provider. Make sure you discuss any questions you have with your health care provider. Document Revised: 12/30/2017 Document Reviewed:  11/06/2016 Elsevier Patient Education  2020 [employer]  

## 2020-02-13 ENCOUNTER — Telehealth: Payer: Self-pay | Admitting: *Deleted

## 2020-02-13 NOTE — Telephone Encounter (Signed)
Pt called office to let us know she pretty certain she has covid based off her symptoms that started last Saturday. Pt states she is very weak and gets dizzy, but has been able to manage at home. She just wanted to make sure if there was anything she needed to do. Informed to rest, hydrate and call if she needs anything and will inform Dr Shawnie Pons.   Called pt back after talking with Dr Shawnie Pons. Informed pt that Dr Shawnie Pons suggested that she get tested to confirm Covid so that when she goes into the hospital she may not need to be tested again, since pt can have a positive results for up to 90 days. Pt understands and will consider getting tested.

## 2020-02-14 ENCOUNTER — Ambulatory Visit: Payer: Medicaid Other | Attending: Internal Medicine

## 2020-02-14 DIAGNOSIS — Z20822 Contact with and (suspected) exposure to covid-19: Secondary | ICD-10-CM | POA: Diagnosis not present

## 2020-02-15 LAB — NOVEL CORONAVIRUS, NAA: SARS-CoV-2, NAA: DETECTED — AB

## 2020-02-15 LAB — SARS-COV-2, NAA 2 DAY TAT

## 2020-02-16 ENCOUNTER — Telehealth: Payer: Self-pay | Admitting: *Deleted

## 2020-02-16 MED ORDER — HYDROXYZINE HCL 25 MG PO TABS: 25.0000 mg | ORAL_TABLET | Freq: Four times a day (QID) | ORAL | 2 refills | Status: DC | PRN

## 2020-02-16 NOTE — Telephone Encounter (Signed)
Pt called office stating she was having itching all over her body with no rash. Informed Dr Shawnie Pons, pt should get bile acids drawn and we can send in atarax for itching. Pt informed.

## 2020-02-19 ENCOUNTER — Other Ambulatory Visit: Payer: Medicaid Other

## 2020-02-19 ENCOUNTER — Other Ambulatory Visit: Payer: Self-pay

## 2020-02-19 DIAGNOSIS — O0991 Supervision of high risk pregnancy, unspecified, first trimester: Secondary | ICD-10-CM

## 2020-02-19 LAB — BILE ACIDS, TOTAL: Bile Acids Total: 6.8 umol/L (ref 0.0–10.0)

## 2020-02-20 ENCOUNTER — Telehealth: Payer: Self-pay | Admitting: *Deleted

## 2020-02-20 LAB — COMPREHENSIVE METABOLIC PANEL
ALT: 64 IU/L — ABNORMAL HIGH (ref 0–32)
AST: 37 IU/L (ref 0–40)
Albumin/Globulin Ratio: 1.4 (ref 1.2–2.2)
Albumin: 3.2 g/dL — ABNORMAL LOW (ref 3.8–4.8)
Alkaline Phosphatase: 73 IU/L (ref 39–117)
BUN/Creatinine Ratio: 15 (ref 9–23)
BUN: 9 mg/dL (ref 6–20)
Bilirubin Total: 0.2 mg/dL (ref 0.0–1.2)
CO2: 19 mmol/L — ABNORMAL LOW (ref 20–29)
Calcium: 8.5 mg/dL — ABNORMAL LOW (ref 8.7–10.2)
Chloride: 108 mmol/L — ABNORMAL HIGH (ref 96–106)
Creatinine, Ser: 0.62 mg/dL (ref 0.57–1.00)
GFR calc Af Amer: 131 mL/min/{1.73_m2} (ref >59)
GFR calc non Af Amer: 114 mL/min/{1.73_m2} (ref >59)
Globulin, Total: 2.3 g/dL (ref 1.5–4.5)
Glucose: 125 mg/dL — ABNORMAL HIGH (ref 65–99)
Potassium: 3.8 mmol/L (ref 3.5–5.2)
Sodium: 139 mmol/L (ref 134–144)
Total Protein: 5.5 g/dL — ABNORMAL LOW (ref 6.0–8.5)

## 2020-02-20 NOTE — Telephone Encounter (Signed)
Pt called back, informed of normal lab results.

## 2020-02-20 NOTE — Telephone Encounter (Signed)
Attempted to call pt to give her her lab resutls

## 2020-02-27 ENCOUNTER — Ambulatory Visit (INDEPENDENT_AMBULATORY_CARE_PROVIDER_SITE_OTHER): Payer: Medicaid Other | Admitting: Family Medicine

## 2020-02-27 VITALS — BP 137/83 | HR 98 | Wt 327.0 lb

## 2020-02-27 DIAGNOSIS — O09523 Supervision of elderly multigravida, third trimester: Secondary | ICD-10-CM

## 2020-02-27 DIAGNOSIS — O10013 Pre-existing essential hypertension complicating pregnancy, third trimester: Secondary | ICD-10-CM

## 2020-02-27 DIAGNOSIS — O0993 Supervision of high risk pregnancy, unspecified, third trimester: Secondary | ICD-10-CM | POA: Diagnosis not present

## 2020-02-27 DIAGNOSIS — O10919 Unspecified pre-existing hypertension complicating pregnancy, unspecified trimester: Secondary | ICD-10-CM

## 2020-02-27 DIAGNOSIS — Z3A32 32 weeks gestation of pregnancy: Secondary | ICD-10-CM | POA: Diagnosis not present

## 2020-02-27 DIAGNOSIS — O0991 Supervision of high risk pregnancy, unspecified, first trimester: Secondary | ICD-10-CM

## 2020-02-27 DIAGNOSIS — Z98891 History of uterine scar from previous surgery: Secondary | ICD-10-CM

## 2020-02-27 NOTE — Progress Notes (Signed)
   PRENATAL VISIT NOTE  Subjective:  [name] is a 39 y.o. [id number] at [redacted]w[redacted]d being seen today for ongoing prenatal care.  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  Patient reports feels better, has recovered from COVID.  Contractions: Irregular. Vag. Bleeding: None.  Movement: Present. Denies leaking of fluid.   The following portions of the patient's history were reviewed and updated as appropriate: allergies, current medications, past family history, past medical history, past social history, past surgical history and problem list.   Objective:   Vitals:   02/27/20 1115  BP: 137/83  Pulse: 98  Weight: (!) 327 lb (148.3 kg)    Fetal Status:     Movement: Present     General:  Alert, oriented and cooperative. Patient is in no acute distress.  Skin: Skin is warm and dry. No rash noted.   Cardiovascular: Normal heart rate noted  Respiratory: Normal respiratory effort, no problems with respiration noted  Abdomen: Soft, gravid, appropriate for gestational age.  Pain/Pressure: Absent     Pelvic: Cervical exam deferred        Extremities: Normal range of motion.  Edema: Trace  Mental Status: Normal mood and affect. Normal behavior. Normal judgment and thought content.   Assessment and Plan:  Pregnancy: [id number] at [redacted]w[redacted]d 1.  Chronic hypertension affecting pregnancy BP is well controlled on labetalol and Procardia NST:  Baseline: 145 bpm, Variability: Good {> 6 bpm), Accelerations: Reactive and Decelerations: Absent Declines further growth scans  2. Multigravida of advanced maternal age in third trimester Declines NIPT  3. History of VBAC For VBAC again, consent signed  4. Supervision of high risk pregnancy, antepartum, first trimester Continue prenatal care.   5. Gestational diabetes likely Minimal CBG checks recently  Preterm labor symptoms and general obstetric precautions including but not limited to vaginal bleeding, contractions, leaking of fluid and fetal movement were reviewed in detail with the patient. Please refer to After Visit Summary for other counseling recommendations.   Return in 1 week (on 03/05/2020) for in person, OB visit and NST.  Future Appointments  Date Time Provider Department Center  03/04/2020  1:00 PM Reva Bores, MD [facility]    Reva Bores, MD

## 2020-02-27 NOTE — Patient Instructions (Signed)
 Breastfeeding  Choosing to breastfeed is one of the [place] decisions you can make for yourself and your baby. A change in hormones during pregnancy causes your breasts to make breast milk in your milk-producing glands. Hormones prevent breast milk from being released before your baby is born. They also prompt milk flow after birth. Once breastfeeding has begun, thoughts of your baby, as well as his or her sucking or crying, can stimulate the release of milk from your milk-producing glands. Benefits of breastfeeding Research shows that breastfeeding offers many health benefits for infants and mothers. It also offers a cost-free and convenient way to feed your baby. For your baby  Your first milk (colostrum) helps your baby's digestive system to function better.  Special cells in your milk (antibodies) help your baby to fight off infections.  Breastfed babies are less likely to develop asthma, allergies, obesity, or type 2 diabetes. They are also at lower risk for sudden infant death syndrome (SIDS).  Nutrients in breast milk are better able to meet your baby's needs compared to infant formula.  Breast milk improves your baby's brain development. For you  Breastfeeding helps to create a very special bond between you and your baby.  Breastfeeding is convenient. Breast milk costs nothing and is always available at the correct temperature.  Breastfeeding helps to burn calories. It helps you to lose the weight that you gained during pregnancy.  Breastfeeding makes your uterus return faster to its size before pregnancy. It also slows bleeding (lochia) after you give birth.  Breastfeeding helps to lower your risk of developing type 2 diabetes, osteoporosis, rheumatoid arthritis, cardiovascular disease, and breast, ovarian, uterine, and endometrial cancer later in life. Breastfeeding basics Starting breastfeeding  Find a comfortable place to sit or lie down, with your neck and back  well-supported.  Place a pillow or a rolled-up blanket under your baby to bring him or her to the level of your breast (if you are seated). Nursing pillows are specially designed to help support your arms and your baby while you breastfeed.  Make sure that your baby's tummy (abdomen) is facing your abdomen.  Gently massage your breast. With your fingertips, massage from the outer edges of your breast inward toward the nipple. This encourages milk flow. If your milk flows slowly, you may need to continue this action during the feeding.  Support your breast with 4 fingers underneath and your thumb above your nipple (make the letter "C" with your hand). Make sure your fingers are well away from your nipple and your baby's mouth.  Stroke your baby's lips gently with your finger or nipple.  When your baby's mouth is open wide enough, quickly bring your baby to your breast, placing your entire nipple and as much of the areola as possible into your baby's mouth. The areola is the colored area around your nipple. ? More areola should be visible above your baby's upper lip than below the lower lip. ? Your baby's lips should be opened and extended outward (flanged) to ensure an adequate, comfortable latch. ? Your baby's tongue should be between his or her lower gum and your breast.  Make sure that your baby's mouth is correctly positioned around your nipple (latched). Your baby's lips should create a seal on your breast and be turned out (everted).  It is common for your baby to suck about 2-3 minutes in order to start the flow of breast milk. Latching Teaching your baby how to latch onto your breast properly   is very important. An improper latch can cause nipple pain, decreased milk supply, and poor weight gain in your baby. Also, if your baby is not latched onto your nipple properly, he or she may swallow some air during feeding. This can make your baby fussy. Burping your baby when you switch breasts  during the feeding can help to get rid of the air. However, teaching your baby to latch on properly is still the [place] way to prevent fussiness from swallowing air while breastfeeding. Signs that your baby has successfully latched onto your nipple  Silent tugging or silent sucking, without causing you pain. Infant's lips should be extended outward (flanged).  Swallowing heard between every 3-4 sucks once your milk has started to flow (after your let-down milk reflex occurs).  Muscle movement above and in front of his or her ears while sucking. Signs that your baby has not successfully latched onto your nipple  Sucking sounds or smacking sounds from your baby while breastfeeding.  Nipple pain. If you think your baby has not latched on correctly, slip your finger into the corner of your baby's mouth to break the suction and place it between your baby's gums. Attempt to start breastfeeding again. Signs of successful breastfeeding Signs from your baby  Your baby will gradually decrease the number of sucks or will completely stop sucking.  Your baby will fall asleep.  Your baby's body will relax.  Your baby will retain a small amount of milk in his or her mouth.  Your baby will let go of your breast by himself or herself. Signs from you  Breasts that have increased in firmness, weight, and size 1-3 hours after feeding.  Breasts that are softer immediately after breastfeeding.  Increased milk volume, as well as a change in milk consistency and color by the fifth day of breastfeeding.  Nipples that are not sore, cracked, or bleeding. Signs that your baby is getting enough milk  Wetting at least 1-2 diapers during the first 24 hours after birth.  Wetting at least 5-6 diapers every 24 hours for the first week after birth. The urine should be clear or pale yellow by the age of 5 days.  Wetting 6-8 diapers every 24 hours as your baby continues to grow and develop.  At least 3 stools in  a 24-hour period by the age of 5 days. The stool should be soft and yellow.  At least 3 stools in a 24-hour period by the age of 7 days. The stool should be seedy and yellow.  No loss of weight greater than 10% of birth weight during the first 3 days of life.  Average weight gain of 4-7 oz (113-198 g) per week after the age of 4 days.  Consistent daily weight gain by the age of 5 days, without weight loss after the age of 2 weeks. After a feeding, your baby may spit up a small amount of milk. This is normal. Breastfeeding frequency and duration Frequent feeding will help you make more milk and can prevent sore nipples and extremely full breasts (breast engorgement). Breastfeed when you feel the need to reduce the fullness of your breasts or when your baby shows signs of hunger. This is called "breastfeeding on demand." Signs that your baby is hungry include:  Increased alertness, activity, or restlessness.  Movement of the head from side to side.  Opening of the mouth when the corner of the mouth or cheek is stroked (rooting).  Increased sucking sounds, smacking lips,   cooing, sighing, or squeaking.  Hand-to-mouth movements and sucking on fingers or hands.  Fussing or crying. Avoid introducing a pacifier to your baby in the first 4-6 weeks after your baby is born. After this time, you may choose to use a pacifier. Research has shown that pacifier use during the first year of a baby's life decreases the risk of sudden infant death syndrome (SIDS). Allow your baby to feed on each breast as long as he or she wants. When your baby unlatches or falls asleep while feeding from the first breast, offer the second breast. Because newborns are often sleepy in the first few weeks of life, you may need to awaken your baby to get him or her to feed. Breastfeeding times will vary from baby to baby. However, the following rules can serve as a guide to help you make sure that your baby is properly  fed:  Newborns (babies 4 weeks of age or younger) may breastfeed every 1-3 hours.  Newborns should not go without breastfeeding for longer than 3 hours during the day or 5 hours during the night.  You should breastfeed your baby a minimum of 8 times in a 24-hour period. Breast milk pumping     Pumping and storing breast milk allows you to make sure that your baby is exclusively fed your breast milk, even at times when you are unable to breastfeed. This is especially important if you go back to work while you are still breastfeeding, or if you are not able to be present during feedings. Your lactation consultant can help you find a method of pumping that works [place] for you and give you guidelines about how long it is safe to store breast milk. Caring for your breasts while you breastfeed Nipples can become dry, cracked, and sore while breastfeeding. The following recommendations can help keep your breasts moisturized and healthy:  Avoid using soap on your nipples.  Wear a supportive bra designed especially for nursing. Avoid wearing underwire-style bras or extremely tight bras (sports bras).  Air-dry your nipples for 3-4 minutes after each feeding.  Use only cotton bra pads to absorb leaked breast milk. Leaking of breast milk between feedings is normal.  Use lanolin on your nipples after breastfeeding. Lanolin helps to maintain your skin's normal moisture barrier. Pure lanolin is not harmful (not toxic) to your baby. You may also hand express a few drops of breast milk and gently massage that milk into your nipples and allow the milk to air-dry. In the first few weeks after giving birth, some women experience breast engorgement. Engorgement can make your breasts feel heavy, warm, and tender to the touch. Engorgement peaks within 3-5 days after you give birth. The following recommendations can help to ease engorgement:  Completely empty your breasts while breastfeeding or pumping. You may  want to start by applying warm, moist heat (in the shower or with warm, water-soaked hand towels) just before feeding or pumping. This increases circulation and helps the milk flow. If your baby does not completely empty your breasts while breastfeeding, pump any extra milk after he or she is finished.  Apply ice packs to your breasts immediately after breastfeeding or pumping, unless this is too uncomfortable for you. To do this: ? Put ice in a plastic bag. ? Place a towel between your skin and the bag. ? Leave the ice on for 20 minutes, 2-3 times a day.  Make sure that your baby is latched on and positioned properly while breastfeeding.   If engorgement persists after 48 hours of following these recommendations, contact your health care provider or a lactation consultant. Overall health care recommendations while breastfeeding  Eat 3 healthy meals and 3 snacks every day. Well-nourished mothers who are breastfeeding need an additional 450-500 calories a day. You can meet this requirement by increasing the amount of a balanced diet that you eat.  Drink enough water to keep your urine pale yellow or clear.  Rest often, relax, and continue to take your prenatal vitamins to prevent fatigue, stress, and low vitamin and mineral levels in your body (nutrient deficiencies).  Do not use any products that contain nicotine or tobacco, such as cigarettes and e-cigarettes. Your baby may be harmed by chemicals from cigarettes that pass into breast milk and exposure to secondhand smoke. If you need help quitting, ask your health care provider.  Avoid alcohol.  Do not use illegal drugs or marijuana.  Talk with your health care provider before taking any medicines. These include over-the-counter and prescription medicines as well as vitamins and herbal supplements. Some medicines that may be harmful to your baby can pass through breast milk.  It is possible to become pregnant while breastfeeding. If birth  control is desired, ask your health care provider about options that will be safe while breastfeeding your baby. Where to find more information: [employer]: www.llli.org Contact a health care provider if:  You feel like you want to stop breastfeeding or have become frustrated with breastfeeding.  Your nipples are cracked or bleeding.  Your breasts are red, tender, or warm.  You have: ? Painful breasts or nipples. ? A swollen area on either breast. ? A fever or chills. ? Nausea or vomiting. ? Drainage other than breast milk from your nipples.  Your breasts do not become full before feedings by the fifth day after you give birth.  You feel sad and depressed.  Your baby is: ? Too sleepy to eat well. ? Having trouble sleeping. ? More than 1 week old and wetting fewer than 6 diapers in a 24-hour period. ? Not gaining weight by 5 days of age.  Your baby has fewer than 3 stools in a 24-hour period.  Your baby's skin or the white parts of his or her eyes become yellow. Get help right away if:  Your baby is overly tired (lethargic) and does not want to wake up and feed.  Your baby develops an unexplained fever. Summary  Breastfeeding offers many health benefits for infant and mothers.  Try to breastfeed your infant when he or she shows early signs of hunger.  Gently tickle or stroke your baby's lips with your finger or nipple to allow the baby to open his or her mouth. Bring the baby to your breast. Make sure that much of the areola is in your baby's mouth. Offer one side and burp the baby before you offer the other side.  Talk with your health care provider or lactation consultant if you have questions or you face problems as you breastfeed. This information is not intended to replace advice given to you by your health care provider. Make sure you discuss any questions you have with your health care provider. Document Revised: 12/30/2017 Document Reviewed:  11/06/2016 [employer] Patient Education  2020 [employer]  

## 2020-03-04 ENCOUNTER — Ambulatory Visit (INDEPENDENT_AMBULATORY_CARE_PROVIDER_SITE_OTHER): Payer: Medicaid Other | Admitting: Family Medicine

## 2020-03-04 ENCOUNTER — Other Ambulatory Visit: Payer: Self-pay

## 2020-03-04 VITALS — BP 136/88 | HR 79 | Wt 329.0 lb

## 2020-03-04 DIAGNOSIS — Z98891 History of uterine scar from previous surgery: Secondary | ICD-10-CM

## 2020-03-04 DIAGNOSIS — E668 Other obesity: Secondary | ICD-10-CM

## 2020-03-04 DIAGNOSIS — O09523 Supervision of elderly multigravida, third trimester: Secondary | ICD-10-CM

## 2020-03-04 DIAGNOSIS — Z8632 Personal history of gestational diabetes: Secondary | ICD-10-CM

## 2020-03-04 DIAGNOSIS — Z3A33 33 weeks gestation of pregnancy: Secondary | ICD-10-CM

## 2020-03-04 DIAGNOSIS — O10913 Unspecified pre-existing hypertension complicating pregnancy, third trimester: Secondary | ICD-10-CM

## 2020-03-04 DIAGNOSIS — O0993 Supervision of high risk pregnancy, unspecified, third trimester: Secondary | ICD-10-CM

## 2020-03-04 DIAGNOSIS — O34219 Maternal care for unspecified type scar from previous cesarean delivery: Secondary | ICD-10-CM

## 2020-03-04 DIAGNOSIS — O10919 Unspecified pre-existing hypertension complicating pregnancy, unspecified trimester: Secondary | ICD-10-CM

## 2020-03-04 DIAGNOSIS — O0991 Supervision of high risk pregnancy, unspecified, first trimester: Secondary | ICD-10-CM

## 2020-03-04 DIAGNOSIS — O99213 Obesity complicating pregnancy, third trimester: Secondary | ICD-10-CM

## 2020-03-04 NOTE — Patient Instructions (Signed)
 Breastfeeding  Choosing to breastfeed is one of the Backer decisions you can make for yourself and your baby. A change in hormones during pregnancy causes your breasts to make breast milk in your milk-producing glands. Hormones prevent breast milk from being released before your baby is born. They also prompt milk flow after birth. Once breastfeeding has begun, thoughts of your baby, as well as his or her sucking or crying, can stimulate the release of milk from your milk-producing glands. Benefits of breastfeeding Research shows that breastfeeding offers many health benefits for infants and mothers. It also offers a cost-free and convenient way to feed your baby. For your baby  Your first milk (colostrum) helps your baby's digestive system to function better.  Special cells in your milk (antibodies) help your baby to fight off infections.  Breastfed babies are less likely to develop asthma, allergies, obesity, or type 2 diabetes. They are also at lower risk for sudden infant death syndrome (SIDS).  Nutrients in breast milk are better able to meet your baby's needs compared to infant formula.  Breast milk improves your baby's brain development. For you  Breastfeeding helps to create a very special bond between you and your baby.  Breastfeeding is convenient. Breast milk costs nothing and is always available at the correct temperature.  Breastfeeding helps to burn calories. It helps you to lose the weight that you gained during pregnancy.  Breastfeeding makes your uterus return faster to its size before pregnancy. It also slows bleeding (lochia) after you give birth.  Breastfeeding helps to lower your risk of developing type 2 diabetes, osteoporosis, rheumatoid arthritis, cardiovascular disease, and breast, ovarian, uterine, and endometrial cancer later in life. Breastfeeding basics Starting breastfeeding  Find a comfortable place to sit or lie down, with your neck and back  well-supported.  Place a pillow or a rolled-up blanket under your baby to bring him or her to the level of your breast (if you are seated). Nursing pillows are specially designed to help support your arms and your baby while you breastfeed.  Make sure that your baby's tummy (abdomen) is facing your abdomen.  Gently massage your breast. With your fingertips, massage from the outer edges of your breast inward toward the nipple. This encourages milk flow. If your milk flows slowly, you may need to continue this action during the feeding.  Support your breast with 4 fingers underneath and your thumb above your nipple (make the letter "C" with your hand). Make sure your fingers are well away from your nipple and your baby's mouth.  Stroke your baby's lips gently with your finger or nipple.  When your baby's mouth is open wide enough, quickly bring your baby to your breast, placing your entire nipple and as much of the areola as possible into your baby's mouth. The areola is the colored area around your nipple. ? More areola should be visible above your baby's upper lip than below the lower lip. ? Your baby's lips should be opened and extended outward (flanged) to ensure an adequate, comfortable latch. ? Your baby's tongue should be between his or her lower gum and your breast.  Make sure that your baby's mouth is correctly positioned around your nipple (latched). Your baby's lips should create a seal on your breast and be turned out (everted).  It is common for your baby to suck about 2-3 minutes in order to start the flow of breast milk. Latching Teaching your baby how to latch onto your breast properly   is very important. An improper latch can cause nipple pain, decreased milk supply, and poor weight gain in your baby. Also, if your baby is not latched onto your nipple properly, he or she may swallow some air during feeding. This can make your baby fussy. Burping your baby when you switch breasts  during the feeding can help to get rid of the air. However, teaching your baby to latch on properly is still the [place] way to prevent fussiness from swallowing air while breastfeeding. Signs that your baby has successfully latched onto your nipple  Silent tugging or silent sucking, without causing you pain. Infant's lips should be extended outward (flanged).  Swallowing heard between every 3-4 sucks once your milk has started to flow (after your let-down milk reflex occurs).  Muscle movement above and in front of his or her ears while sucking. Signs that your baby has not successfully latched onto your nipple  Sucking sounds or smacking sounds from your baby while breastfeeding.  Nipple pain. If you think your baby has not latched on correctly, slip your finger into the corner of your baby's mouth to break the suction and place it between your baby's gums. Attempt to start breastfeeding again. Signs of successful breastfeeding Signs from your baby  Your baby will gradually decrease the number of sucks or will completely stop sucking.  Your baby will fall asleep.  Your baby's body will relax.  Your baby will retain a small amount of milk in his or her mouth.  Your baby will let go of your breast by himself or herself. Signs from you  Breasts that have increased in firmness, weight, and size 1-3 hours after feeding.  Breasts that are softer immediately after breastfeeding.  Increased milk volume, as well as a change in milk consistency and color by the fifth day of breastfeeding.  Nipples that are not sore, cracked, or bleeding. Signs that your baby is getting enough milk  Wetting at least 1-2 diapers during the first 24 hours after birth.  Wetting at least 5-6 diapers every 24 hours for the first week after birth. The urine should be clear or pale yellow by the age of 5 days.  Wetting 6-8 diapers every 24 hours as your baby continues to grow and develop.  At least 3 stools in  a 24-hour period by the age of 5 days. The stool should be soft and yellow.  At least 3 stools in a 24-hour period by the age of 7 days. The stool should be seedy and yellow.  No loss of weight greater than 10% of birth weight during the first 3 days of life.  Average weight gain of 4-7 oz (113-198 g) per week after the age of 4 days.  Consistent daily weight gain by the age of 5 days, without weight loss after the age of 2 weeks. After a feeding, your baby may spit up a small amount of milk. This is normal. Breastfeeding frequency and duration Frequent feeding will help you make more milk and can prevent sore nipples and extremely full breasts (breast engorgement). Breastfeed when you feel the need to reduce the fullness of your breasts or when your baby shows signs of hunger. This is called "breastfeeding on demand." Signs that your baby is hungry include:  Increased alertness, activity, or restlessness.  Movement of the head from side to side.  Opening of the mouth when the corner of the mouth or cheek is stroked (rooting).  Increased sucking sounds, smacking lips,   cooing, sighing, or squeaking.  Hand-to-mouth movements and sucking on fingers or hands.  Fussing or crying. Avoid introducing a pacifier to your baby in the first 4-6 weeks after your baby is born. After this time, you may choose to use a pacifier. Research has shown that pacifier use during the first year of a baby's life decreases the risk of sudden infant death syndrome (SIDS). Allow your baby to feed on each breast as long as he or she wants. When your baby unlatches or falls asleep while feeding from the first breast, offer the second breast. Because newborns are often sleepy in the first few weeks of life, you may need to awaken your baby to get him or her to feed. Breastfeeding times will vary from baby to baby. However, the following rules can serve as a guide to help you make sure that your baby is properly  fed:  Newborns (babies 4 weeks of age or younger) may breastfeed every 1-3 hours.  Newborns should not go without breastfeeding for longer than 3 hours during the day or 5 hours during the night.  You should breastfeed your baby a minimum of 8 times in a 24-hour period. Breast milk pumping     Pumping and storing breast milk allows you to make sure that your baby is exclusively fed your breast milk, even at times when you are unable to breastfeed. This is especially important if you go back to work while you are still breastfeeding, or if you are not able to be present during feedings. Your lactation consultant can help you find a method of pumping that works [place] for you and give you guidelines about how long it is safe to store breast milk. Caring for your breasts while you breastfeed Nipples can become dry, cracked, and sore while breastfeeding. The following recommendations can help keep your breasts moisturized and healthy:  Avoid using soap on your nipples.  Wear a supportive bra designed especially for nursing. Avoid wearing underwire-style bras or extremely tight bras (sports bras).  Air-dry your nipples for 3-4 minutes after each feeding.  Use only cotton bra pads to absorb leaked breast milk. Leaking of breast milk between feedings is normal.  Use lanolin on your nipples after breastfeeding. Lanolin helps to maintain your skin's normal moisture barrier. Pure lanolin is not harmful (not toxic) to your baby. You may also hand express a few drops of breast milk and gently massage that milk into your nipples and allow the milk to air-dry. In the first few weeks after giving birth, some women experience breast engorgement. Engorgement can make your breasts feel heavy, warm, and tender to the touch. Engorgement peaks within 3-5 days after you give birth. The following recommendations can help to ease engorgement:  Completely empty your breasts while breastfeeding or pumping. You may  want to start by applying warm, moist heat (in the shower or with warm, water-soaked hand towels) just before feeding or pumping. This increases circulation and helps the milk flow. If your baby does not completely empty your breasts while breastfeeding, pump any extra milk after he or she is finished.  Apply ice packs to your breasts immediately after breastfeeding or pumping, unless this is too uncomfortable for you. To do this: ? Put ice in a plastic bag. ? Place a towel between your skin and the bag. ? Leave the ice on for 20 minutes, 2-3 times a day.  Make sure that your baby is latched on and positioned properly while breastfeeding.   If engorgement persists after 48 hours of following these recommendations, contact your health care provider or a lactation consultant. Overall health care recommendations while breastfeeding  Eat 3 healthy meals and 3 snacks every day. Well-nourished mothers who are breastfeeding need an additional 450-500 calories a day. You can meet this requirement by increasing the amount of a balanced diet that you eat.  Drink enough water to keep your urine pale yellow or clear.  Rest often, relax, and continue to take your prenatal vitamins to prevent fatigue, stress, and low vitamin and mineral levels in your body (nutrient deficiencies).  Do not use any products that contain nicotine or tobacco, such as cigarettes and e-cigarettes. Your baby may be harmed by chemicals from cigarettes that pass into breast milk and exposure to secondhand smoke. If you need help quitting, ask your health care provider.  Avoid alcohol.  Do not use illegal drugs or marijuana.  Talk with your health care provider before taking any medicines. These include over-the-counter and prescription medicines as well as vitamins and herbal supplements. Some medicines that may be harmful to your baby can pass through breast milk.  It is possible to become pregnant while breastfeeding. If birth  control is desired, ask your health care provider about options that will be safe while breastfeeding your baby. Where to find more information: [employer]: www.llli.org Contact a health care provider if:  You feel like you want to stop breastfeeding or have become frustrated with breastfeeding.  Your nipples are cracked or bleeding.  Your breasts are red, tender, or warm.  You have: ? Painful breasts or nipples. ? A swollen area on either breast. ? A fever or chills. ? Nausea or vomiting. ? Drainage other than breast milk from your nipples.  Your breasts do not become full before feedings by the fifth day after you give birth.  You feel sad and depressed.  Your baby is: ? Too sleepy to eat well. ? Having trouble sleeping. ? More than 1 week old and wetting fewer than 6 diapers in a 24-hour period. ? Not gaining weight by 5 days of age.  Your baby has fewer than 3 stools in a 24-hour period.  Your baby's skin or the white parts of his or her eyes become yellow. Get help right away if:  Your baby is overly tired (lethargic) and does not want to wake up and feed.  Your baby develops an unexplained fever. Summary  Breastfeeding offers many health benefits for infant and mothers.  Try to breastfeed your infant when he or she shows early signs of hunger.  Gently tickle or stroke your baby's lips with your finger or nipple to allow the baby to open his or her mouth. Bring the baby to your breast. Make sure that much of the areola is in your baby's mouth. Offer one side and burp the baby before you offer the other side.  Talk with your health care provider or lactation consultant if you have questions or you face problems as you breastfeed. This information is not intended to replace advice given to you by your health care provider. Make sure you discuss any questions you have with your health care provider. Document Revised: 12/30/2017 Document Reviewed:  11/06/2016 Elsevier Patient Education  2020 Elsevier Inc.  

## 2020-03-05 NOTE — Progress Notes (Signed)
   PRENATAL VISIT NOTE  Subjective:  [name] is a 39 y.o. [id number] at [redacted]w[redacted]d being seen today for ongoing prenatal care.  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  Patient reports contractions which can be regular and will go away with rest, at times they are painful..  Contractions: Not present.  .  Movement: Present. Denies leaking of fluid.   The following portions of the patient's history were reviewed and updated as appropriate: allergies, current medications, past family history, past medical history, past social history, past surgical history and problem list.   Objective:   Vitals:   03/04/20 1319  BP: 136/88  Pulse: 79  Weight: (!) 329 lb (149.2 kg)    Fetal Status:     Movement: Present     General:  Alert, oriented and cooperative. Patient is in no acute distress.  Skin: Skin is warm and dry. No rash noted.   Cardiovascular: Normal heart rate noted  Respiratory: Normal respiratory effort, no problems with respiration noted  Abdomen: Soft, gravid, appropriate for gestational age.  Pain/Pressure: Present     Pelvic: Cervical exam deferred        Extremities: Normal range of motion.  Edema: Trace  Mental Status: Normal mood and affect. Normal behavior. Normal judgment and thought content.   Assessment and Plan:    Pregnancy: [id number] at 108w1d 1. Chronic hypertension affecting pregnancy BP ok--continue Labetalol and Procardia. Has declined growth - during NST a 4 min. Bradycardia noted, but monitored x 1 hour after with very reassuring results. Limited US reveals transverse lie, AFI of 10, + fetal movement and tone noted. Fetal kick counts reviewed. Weekly NSTs. - Fetal nonstress test  2. Multigravida of advanced maternal age in third trimester Declined genetics  3. History of VBAC Consent signed and on the chart  4. Supervision of high risk pregnancy, antepartum, first trimester Likely has GDM, declined challenge. Reports fasting CBGs in 89-100 range.  Preterm labor symptoms and general obstetric precautions including but not limited to vaginal bleeding, contractions, leaking of fluid and fetal movement were reviewed in detail with the patient. Please refer to After Visit Summary for other counseling recommendations.   Return in 1 week (on 03/11/2020).  Future Appointments  Date Time Provider Department Center  03/11/2020  1:45 PM [place] Bing, MD [facility]    Reva Bores, MD

## 2020-03-11 ENCOUNTER — Ambulatory Visit (INDEPENDENT_AMBULATORY_CARE_PROVIDER_SITE_OTHER): Payer: Medicaid Other | Admitting: Obstetrics and Gynecology

## 2020-03-11 ENCOUNTER — Other Ambulatory Visit: Payer: Self-pay

## 2020-03-11 VITALS — BP 140/85 | HR 97 | Wt 334.0 lb

## 2020-03-11 DIAGNOSIS — Z6841 Body Mass Index (BMI) 40.0 and over, adult: Secondary | ICD-10-CM

## 2020-03-11 DIAGNOSIS — Z8616 Personal history of COVID-19: Secondary | ICD-10-CM

## 2020-03-11 DIAGNOSIS — Z3A34 34 weeks gestation of pregnancy: Secondary | ICD-10-CM

## 2020-03-11 DIAGNOSIS — O10013 Pre-existing essential hypertension complicating pregnancy, third trimester: Secondary | ICD-10-CM

## 2020-03-11 DIAGNOSIS — O0991 Supervision of high risk pregnancy, unspecified, first trimester: Secondary | ICD-10-CM

## 2020-03-11 DIAGNOSIS — O99213 Obesity complicating pregnancy, third trimester: Secondary | ICD-10-CM

## 2020-03-11 DIAGNOSIS — R8271 Bacteriuria: Secondary | ICD-10-CM

## 2020-03-11 DIAGNOSIS — E669 Obesity, unspecified: Secondary | ICD-10-CM

## 2020-03-11 DIAGNOSIS — O09523 Supervision of elderly multigravida, third trimester: Secondary | ICD-10-CM

## 2020-03-11 DIAGNOSIS — O24419 Gestational diabetes mellitus in pregnancy, unspecified control: Secondary | ICD-10-CM

## 2020-03-11 DIAGNOSIS — Z98891 History of uterine scar from previous surgery: Secondary | ICD-10-CM

## 2020-03-11 DIAGNOSIS — O0993 Supervision of high risk pregnancy, unspecified, third trimester: Secondary | ICD-10-CM

## 2020-03-11 DIAGNOSIS — O99210 Obesity complicating pregnancy, unspecified trimester: Secondary | ICD-10-CM

## 2020-03-11 DIAGNOSIS — O329XX0 Maternal care for malpresentation of fetus, unspecified, not applicable or unspecified: Secondary | ICD-10-CM | POA: Insufficient documentation

## 2020-03-11 DIAGNOSIS — O10919 Unspecified pre-existing hypertension complicating pregnancy, unspecified trimester: Secondary | ICD-10-CM

## 2020-03-11 HISTORY — DX: Personal history of COVID-19: Z86.16

## 2020-03-11 NOTE — Progress Notes (Signed)
Prenatal Visit Note Date: 03/11/2020 Clinic: [facility]  Subjective:  [name] is a 39 y.o. [id number] at [redacted]w[redacted]d being seen today for ongoing prenatal care.  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  Patient reports no complaints.   Contractions: Irregular. Vag. Bleeding: None.  Movement: Present. Denies leaking of fluid.   The following portions of the patient's history were reviewed and updated as appropriate: allergies, current medications, past family history, past medical history, past social history, past surgical history and problem list. Problem list updated.  Objective:   Vitals:   03/11/20 1350  BP: 140/85  Pulse: 97  Weight: (!) 334 lb (151.5 kg)    Fetal Status: Fetal Heart Rate (bpm): NST   Movement: Present     General:  Alert, oriented and cooperative. Patient is in no acute distress.  Skin: Skin is warm and dry. No rash noted.   Cardiovascular: Normal heart rate noted  Respiratory: Normal respiratory effort, no problems with respiration noted  Abdomen: Soft, gravid, appropriate for gestational age. Pain/Pressure: Present     Pelvic:  Cervical exam deferred        Extremities: Normal  range of motion.  Edema: Mild pitting, slight indentation  Mental Status: Normal mood and affect. Normal behavior. Normal judgment and thought content.   Urinalysis:      Assessment and Plan:  Pregnancy: [id number] at [redacted]w[redacted]d  1. Chronic hypertension affecting pregnancy Patient confirms only on labetalol 200 bid and usually remembers to take the low dose ASA I told her I recommend serial growth u/s qmonth and qwk BPP. Pt amenable to BPPs but declines serial growth u/s. I told her if she changes her mind we can add on the serial growth u/s I also told her will follow up mfm recs re: delivery planning as b/w 37-39wks is recommended for cHTN and given her co-moribidities (see below) a 37wk IOL may be [name] - [place] MFM FETAL BPP WO NON STRESS; Future  2. Supervision of high risk pregnancy, antepartum, first trimester Reactive NST today: 145 baseline, +accels, no decel, mod variability, toco quiet x 27m - [place] MFM FETAL BPP WO NON STRESS; Future  3. Obesity in pregnancy, antepartum  4. History of VBAC 3/29 vbac consent signed   5. Group B streptococcal bacteriuria tx in labor  6. BMI 40.0-44.9, adult ([name])  7. Multigravida of advanced maternal age in third trimester  8. Malpresentation before onset of labor, single or unspecified fetus Transverse on u/s last week. Follow up BPPs  9. Gestational diabetes mellitus (GDM) in third trimester, gestational diabetes method of control unspecified Had GDM last pregnancy. Declined GTT this pregnancy. She checks her CBGs a few times a week, am fasting and 2h PP and states numbers are normal. I recommend checking them as much as possibl  Preterm labor symptoms and general obstetric precautions including but not limited to vaginal bleeding, contractions, leaking  of fluid and fetal movement were reviewed in detail with the patient. Please refer to After Visit Summary for other counseling recommendations.  Return in about 1 week (around 03/18/2020) for in  person, high risk.    Bing, MD

## 2020-03-14 ENCOUNTER — Ambulatory Visit: Payer: Medicaid Other | Attending: Obstetrics and Gynecology

## 2020-03-14 ENCOUNTER — Other Ambulatory Visit: Payer: Self-pay

## 2020-03-14 DIAGNOSIS — O10919 Unspecified pre-existing hypertension complicating pregnancy, unspecified trimester: Secondary | ICD-10-CM | POA: Diagnosis not present

## 2020-03-14 DIAGNOSIS — O09523 Supervision of elderly multigravida, third trimester: Secondary | ICD-10-CM | POA: Diagnosis not present

## 2020-03-14 DIAGNOSIS — R8271 Bacteriuria: Secondary | ICD-10-CM | POA: Diagnosis not present

## 2020-03-14 DIAGNOSIS — O10913 Unspecified pre-existing hypertension complicating pregnancy, third trimester: Secondary | ICD-10-CM | POA: Diagnosis not present

## 2020-03-14 DIAGNOSIS — O0991 Supervision of high risk pregnancy, unspecified, first trimester: Secondary | ICD-10-CM | POA: Insufficient documentation

## 2020-03-14 DIAGNOSIS — Z3A34 34 weeks gestation of pregnancy: Secondary | ICD-10-CM

## 2020-03-15 ENCOUNTER — Other Ambulatory Visit: Payer: Self-pay | Admitting: *Deleted

## 2020-03-15 DIAGNOSIS — O10919 Unspecified pre-existing hypertension complicating pregnancy, unspecified trimester: Secondary | ICD-10-CM

## 2020-03-17 ENCOUNTER — Encounter: Payer: Self-pay | Admitting: Obstetrics and Gynecology

## 2020-03-17 DIAGNOSIS — O283 Abnormal ultrasonic finding on antenatal screening of mother: Secondary | ICD-10-CM | POA: Insufficient documentation

## 2020-03-19 ENCOUNTER — Telehealth: Payer: Medicaid Other | Admitting: Family Medicine

## 2020-03-21 ENCOUNTER — Telehealth: Payer: Self-pay

## 2020-03-21 NOTE — Telephone Encounter (Signed)
Received call from patient she reports having some higher blood pressure reading and a mild headache for the last couple days. Patient reports b/p this am was 150/100 this was taken straight out of bed with no medications. She is currently taken her labetalol  Q12 hours. She does take aspirin as instructed. Per Dr.Pickens she should drink lots of water, eat a good breakfast and can take tylenol 1,000mg  for her headache if she desires. I have advised patient to monitor her blood pressure and if she continues to have high reading she should report to the women's and children unit for follow-up care. She can also send the office her b/p reading using her my-chart. Patient voice understanding at this time.

## 2020-03-22 ENCOUNTER — Other Ambulatory Visit: Payer: Self-pay

## 2020-03-22 ENCOUNTER — Ambulatory Visit: Payer: Medicaid Other | Admitting: *Deleted

## 2020-03-22 ENCOUNTER — Encounter (HOSPITAL_COMMUNITY): Payer: Self-pay | Admitting: Obstetrics and Gynecology

## 2020-03-22 ENCOUNTER — Inpatient Hospital Stay (HOSPITAL_COMMUNITY)
Admission: AD | Admit: 2020-03-22 | Discharge: 2020-03-22 | Disposition: A | Discharge status: Home / Self Care | Payer: Medicaid Other | Attending: Obstetrics and Gynecology | Admitting: Obstetrics and Gynecology

## 2020-03-22 ENCOUNTER — Ambulatory Visit: Payer: Medicaid Other | Attending: Obstetrics and Gynecology

## 2020-03-22 DIAGNOSIS — R519 Headache, unspecified: Secondary | ICD-10-CM | POA: Diagnosis not present

## 2020-03-22 DIAGNOSIS — Z7982 Long term (current) use of aspirin: Secondary | ICD-10-CM | POA: Insufficient documentation

## 2020-03-22 DIAGNOSIS — O10013 Pre-existing essential hypertension complicating pregnancy, third trimester: Secondary | ICD-10-CM

## 2020-03-22 DIAGNOSIS — O10913 Unspecified pre-existing hypertension complicating pregnancy, third trimester: Secondary | ICD-10-CM

## 2020-03-22 DIAGNOSIS — Z79899 Other long term (current) drug therapy: Secondary | ICD-10-CM | POA: Insufficient documentation

## 2020-03-22 DIAGNOSIS — Z8616 Personal history of COVID-19: Secondary | ICD-10-CM | POA: Insufficient documentation

## 2020-03-22 DIAGNOSIS — Z8249 Family history of ischemic heart disease and other diseases of the circulatory system: Secondary | ICD-10-CM | POA: Insufficient documentation

## 2020-03-22 DIAGNOSIS — O10919 Unspecified pre-existing hypertension complicating pregnancy, unspecified trimester: Secondary | ICD-10-CM

## 2020-03-22 DIAGNOSIS — O09523 Supervision of elderly multigravida, third trimester: Secondary | ICD-10-CM | POA: Diagnosis not present

## 2020-03-22 DIAGNOSIS — R8271 Bacteriuria: Secondary | ICD-10-CM | POA: Diagnosis not present

## 2020-03-22 DIAGNOSIS — Z888 Allergy status to other drugs, medicaments and biological substances status: Secondary | ICD-10-CM | POA: Insufficient documentation

## 2020-03-22 DIAGNOSIS — Z3689 Encounter for other specified antenatal screening: Secondary | ICD-10-CM

## 2020-03-22 DIAGNOSIS — Z3A35 35 weeks gestation of pregnancy: Secondary | ICD-10-CM | POA: Diagnosis not present

## 2020-03-22 DIAGNOSIS — I10 Essential (primary) hypertension: Secondary | ICD-10-CM

## 2020-03-22 LAB — COMPREHENSIVE METABOLIC PANEL
ALT: 17 U/L (ref 0–44)
AST: 17 U/L (ref 15–41)
Albumin: 2.7 g/dL — ABNORMAL LOW (ref 3.5–5.0)
Alkaline Phosphatase: 79 U/L (ref 38–126)
Anion gap: 10 (ref 5–15)
BUN: 9 mg/dL (ref 6–20)
CO2: 21 mmol/L — ABNORMAL LOW (ref 22–32)
Calcium: 8.9 mg/dL (ref 8.9–10.3)
Chloride: 106 mmol/L (ref 98–111)
Creatinine, Ser: 0.66 mg/dL (ref 0.44–1.00)
GFR calc Af Amer: >60 mL/min (ref >60)
GFR calc non Af Amer: >60 mL/min (ref >60)
Glucose, Bld: 91 mg/dL (ref 70–99)
Potassium: 3.7 mmol/L (ref 3.5–5.1)
Sodium: 137 mmol/L (ref 135–145)
Total Bilirubin: 0.3 mg/dL (ref 0.3–1.2)
Total Protein: 5.9 g/dL — ABNORMAL LOW (ref 6.5–8.1)

## 2020-03-22 LAB — PROTEIN / CREATININE RATIO, URINE
Creatinine, Urine: 126.21 mg/dL
Protein Creatinine Ratio: 0.06 mg/mg{Cre} (ref 0.00–0.15)
Total Protein, Urine: 8 mg/dL

## 2020-03-22 LAB — CBC
HCT: 32.3 % — ABNORMAL LOW (ref 36.0–46.0)
Hemoglobin: 10.2 g/dL — ABNORMAL LOW (ref 12.0–15.0)
MCH: 26.5 pg (ref 26.0–34.0)
MCHC: 31.6 g/dL (ref 30.0–36.0)
MCV: 83.9 fL (ref 80.0–100.0)
Platelets: 218 10*3/uL (ref 150–400)
RBC: 3.85 MIL/uL — ABNORMAL LOW (ref 3.87–5.11)
RDW: 14.5 % (ref 11.5–15.5)
WBC: 8.1 10*3/uL (ref 4.0–10.5)
nRBC: 0 % (ref 0.0–0.2)

## 2020-03-22 MED ORDER — LABETALOL HCL 5 MG/ML IV SOLN: 40.0000 mg | INTRAVENOUS | Status: DC | PRN

## 2020-03-22 MED ORDER — LABETALOL HCL 5 MG/ML IV SOLN: 80.0000 mg | INTRAVENOUS | Status: DC | PRN

## 2020-03-22 MED ORDER — LABETALOL HCL 100 MG PO TABS
200.0000 mg | ORAL_TABLET | Freq: Once | ORAL | Status: AC
  Administered 2020-03-22: 200 mg via ORAL
  Filled 2020-03-22: qty 2

## 2020-03-22 MED ORDER — LABETALOL HCL 200 MG PO TABS: 200.0000 mg | ORAL_TABLET | Freq: Three times a day (TID) | ORAL | 3 refills | Status: DC

## 2020-03-22 MED ORDER — ACETAMINOPHEN 500 MG PO TABS
1000.0000 mg | ORAL_TABLET | Freq: Once | ORAL | Status: AC
  Administered 2020-03-22: 1000 mg via ORAL
  Filled 2020-03-22: qty 2

## 2020-03-22 MED ORDER — HYDRALAZINE HCL 20 MG/ML IJ SOLN: 10.0000 mg | INTRAMUSCULAR | Status: DC | PRN

## 2020-03-22 MED ORDER — LABETALOL HCL 5 MG/ML IV SOLN: 20.0000 mg | INTRAVENOUS | Status: DC | PRN

## 2020-03-22 NOTE — Discharge Instructions (Signed)
Preeclampsia and Eclampsia Preeclampsia is a serious condition that may develop during pregnancy. This condition causes high blood pressure and increased protein in your urine along with other symptoms, such as headaches and vision changes. These symptoms may develop as the condition gets worse. Preeclampsia may occur at 20 weeks of pregnancy or later. Diagnosing and treating preeclampsia early is very important. If not treated early, it can cause serious problems for you and your baby. One problem it can lead to is eclampsia. Eclampsia is a condition that causes muscle jerking or shaking (convulsions or seizures) and other serious problems for the mother. During pregnancy, delivering your baby may be the [place] treatment for preeclampsia or eclampsia. For most women, preeclampsia and eclampsia symptoms go away after giving birth. In rare cases, a woman may develop preeclampsia after giving birth (postpartum preeclampsia). This usually occurs within 48 hours after childbirth but may occur up to 6 weeks after giving birth. What are the causes? The cause of preeclampsia is not known. What increases the risk? The following risk factors make you more likely to develop preeclampsia:  Being pregnant for the first time.  Having had preeclampsia during a past pregnancy.  Having a family history of preeclampsia.  Having high blood pressure.  Being pregnant with more than one baby.  Being 35 or older.  Being African-American.  Having kidney disease or diabetes.  Having medical conditions such as lupus or blood diseases.  Being very overweight (obese). What are the signs or symptoms? The most common symptoms are:  Severe headaches.  Vision problems, such as blurred or double vision.  Abdominal pain, especially upper abdominal pain. Other symptoms that may develop as the condition gets worse include:  Sudden weight gain.  Sudden swelling of the hands, face, legs, and feet.  Severe nausea  and vomiting.  Numbness in the face, arms, legs, and feet.  Dizziness.  Urinating less than usual.  Slurred speech.  Convulsions or seizures. How is this diagnosed? There are no screening tests for preeclampsia. Your health care provider will ask you about symptoms and check for signs of preeclampsia during your prenatal visits. You may also have tests that include:  Checking your blood pressure.  Urine tests to check for protein. Your health care provider will check for this at every prenatal visit.  Blood tests.  Monitoring your baby's heart rate.  Ultrasound. How is this treated? You and your health care provider will determine the treatment approach that is [place] for you. Treatment may include:  Having more frequent prenatal exams to check for signs of preeclampsia, if you have an increased risk for preeclampsia.  Medicine to lower your blood pressure.  Staying in the hospital, if your condition is severe. There, treatment will focus on controlling your blood pressure and the amount of fluids in your body (fluid retention).  Taking medicine (magnesium sulfate) to prevent seizures. This may be given as an injection or through an IV.  Taking a low-dose aspirin during your pregnancy.  Delivering your baby early. You may have your labor started with medicine (induced), or you may have a cesarean delivery. Follow these instructions at home: Eating and drinking   Drink enough fluid to keep your urine pale yellow.  Avoid caffeine. Lifestyle  Do not use any products that contain nicotine or tobacco, such as cigarettes and e-cigarettes. If you need help quitting, ask your health care provider.  Do not use alcohol or drugs.  Avoid stress as much as possible. Rest and get   plenty of sleep. General instructions  Take over-the-counter and prescription medicines only as told by your health care provider.  When lying down, lie on your left side. This keeps pressure off your  major blood vessels.  When sitting or lying down, raise (elevate) your feet. Try putting some pillows underneath your lower legs.  Exercise regularly. Ask your health care provider what kinds of exercise are [place] for you.  Keep all follow-up and prenatal visits as told by your health care provider. This is important. How is this prevented? There is no known way of preventing preeclampsia or eclampsia from developing. However, to lower your risk of complications and detect problems early:  Get regular prenatal care. Your health care provider may be able to diagnose and treat the condition early.  Maintain a healthy weight. Ask your health care provider for help managing weight gain during pregnancy.  Work with your health care provider to manage any long-term (chronic) health conditions you have, such as diabetes or kidney problems.  You may have tests of your blood pressure and kidney function after giving birth.  Your health care provider may have you take low-dose aspirin during your next pregnancy. Contact a health care provider if:  You have symptoms that your health care provider told you may require more treatment or monitoring, such as: ? Headaches. ? Nausea or vomiting. ? Abdominal pain. ? Dizziness. ? Light-headedness. Get help right away if:  You have severe: ? Abdominal pain. ? Headaches that do not get better. ? Dizziness. ? Vision problems. ? Confusion. ? Nausea or vomiting.  You have any of the following: ? A seizure. ? Sudden, rapid weight gain. ? Sudden swelling in your hands, ankles, or face. ? Trouble moving any part of your body. ? Numbness in any part of your body. ? Trouble speaking. ? Abnormal bleeding.  You faint. Summary  Preeclampsia is a serious condition that may develop during pregnancy.  This condition causes high blood pressure and increased protein in your urine along with other symptoms, such as headaches and vision  changes.  Diagnosing and treating preeclampsia early is very important. If not treated early, it can cause serious problems for you and your baby.  Get help right away if you have symptoms that your health care provider told you to watch for. This information is not intended to replace advice given to you by your health care provider. Make sure you discuss any questions you have with your health care provider. Document Revised: 06/07/2018 Document Reviewed: 05/11/2016 Elsevier Patient Education  2020 [employer]  

## 2020-03-22 NOTE — MAU Note (Signed)
BP up, is on Labetalol. Started not feeling well on Sat, very anxious, vision off, off and on mild HA since then. Mild HA now, denies vision changes or epigastric pain; swelling has increased in lower extr.  Has been having some ctx's, not regular.

## 2020-03-22 NOTE — MAU Provider Note (Signed)
History     CSN: [id number]  Arrival date and time: 03/22/20 1728  First Provider Initiated Contact with Patient 03/22/20 1758     Chief Complaint  Patient presents with  . Headache  . Hypertension  . Foot Swelling   HPI [name] is a 39 y.o. [id number] at [redacted]w[redacted]d who presents to MAU from [facility]FM for evaluation of severe range blood pressure x 1 and mild headache in setting of Chronic Hypertension. She manages her blood pressure with Labetalol 200 mg BID. She states she was due for her evening dose at 5pm but did not take it.  She endorses mild headache, pain score 2-3/10. Her pain is anterior, non-radiating. She denies visual disturbances, RUQ/epigastric pain, new onset swelling or weight gain.  Patient reports she could tell her blood pressure was high last weekend because she "felt off". 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 She reports readings of 150s/80s during that time.  She denies vaginal bleeding, leaking of fluid, decreased fetal movement, fever, falls, or recent illness.   She receives care at [facility] and her next appointment is a virtual visit on 03/26/20.  OB History    Gravida  10   Para  6   Term  5   Preterm  1   AB  3   Living  6     SAB  3   TAB      Ectopic      Multiple  0   Live Births  6           Past Medical History:  Diagnosis Date  . Allergic rhinitis 09/06/2015  . Anginal pain (HCC)   . Anxiety   . Deaf, left   . Excess, menstruation 09/06/2015  . Gestational diabetes 2008    glyburide this pregnancy  . History of chicken pox   . History of COVID-19 03/11/2020   April 2021  . History of gestational diabetes 04/16/2016   Early a1c neg  . Hydronephrosis, right 11/08/2012  . Insomnia 09/06/2015  . Kidney stones 2013   13 passed  . Nephrolithiasis 11/29/2012  . Pregnancy induced hypertension    labetolol  . Preterm labor    delivery    Past Surgical History:   Procedure Laterality Date  . CESAREAN SECTION  2006  . WISDOM TOOTH EXTRACTION     x4    Family History  Problem Relation Age of Onset  . Schizophrenia Maternal Grandmother   . Atrial fibrillation Mother   . Depression Mother   . Anxiety disorder Mother   . Mental illness Father   . Heart attack Maternal Grandfather   . Atrial fibrillation Paternal Grandfather   . Diabetes Paternal Grandfather   . Heart attack Paternal Grandfather   . Allergies Sister     Social History   Tobacco Use  . Smoking status: Never Smoker  . Smokeless tobacco: Never Used  Substance Use Topics  . Alcohol use: No    Alcohol/week: 0.0 standard drinks  . Drug use: No    Allergies:  Allergies  Allergen Reactions  . Ivp Dye [Iodinated Diagnostic Agents] Anaphylaxis  . Iodine   . Phenergan Fortis [Promethazine] Itching    Itching/pins and needles for hours.    Medications Prior to Admission  Medication Sig Dispense Refill Last Dose  . aspirin EC 81 MG tablet Take 1 tablet (81 mg total) by mouth daily. 90 tablet 3 Past  Week at Unknown time  . labetalol (NORMODYNE) 200 MG tablet Take 1 tablet (200 mg total) by mouth every 12 (twelve) hours. 180 tablet 5 03/22/2020 at Unknown time  . Prenatal Vit-Fe Fumarate-FA (PRENATAL MULTIVITAMIN) TABS tablet Take 1 tablet by mouth daily at 12 noon.   03/22/2020 at Unknown time  . Ascorbic Acid (VITA-C PO) Take by mouth.     . hydrOXYzine (ATARAX/VISTARIL) 25 MG tablet Take 1 tablet (25 mg total) by mouth every 6 (six) hours as needed for itching. (Patient not taking: Reported on 02/27/2020) 30 tablet 2   . Multiple Vitamins-Minerals (MULTIVITAMIN WITH MINERALS) tablet Take 1 tablet by mouth daily.     . Multiple Vitamins-Minerals (ZINC PO) Take by mouth.     . nitroGLYCERIN (NITROSTAT) 0.4 MG SL tablet Place 1 tablet (0.4 mg total) under the tongue every 5 (five) minutes as needed for chest pain. 90 tablet 3   . VITAMIN D PO Take by mouth.       Review of  Systems  Eyes: Negative for photophobia and visual disturbance.  Cardiovascular: Negative for palpitations.  Gastrointestinal: Negative for abdominal pain.  Genitourinary: Negative for vaginal bleeding.  Musculoskeletal: Negative for back pain.  Neurological: Positive for headaches. Negative for syncope and weakness.  All other systems reviewed and are negative.  Physical Exam   Blood pressure (!) 155/75, pulse 96, temperature 99.2 F (37.3 C), temperature source Oral, resp. rate 20, height 6\' 1"  (1.854 m), weight (!) 151.5 kg, last menstrual period 07/17/2019, SpO2 100 %, unknown if currently breastfeeding.  Physical Exam  Nursing note and vitals reviewed. Constitutional: She is oriented to person, place, and time. She appears well-developed and well-nourished.  Cardiovascular: Normal rate and normal heart sounds.  Respiratory: Effort normal and breath sounds normal. She has no decreased breath sounds.  GI: Soft. She exhibits no distension. There is no abdominal tenderness. There is no rebound, no guarding and no CVA tenderness.  Neurological: She is alert and oriented to person, place, and time.  Skin: Skin is warm and dry.  Psychiatric: She has a normal mood and affect. Her behavior is normal. Judgment and thought content normal.    MAU Course/MDM  Procedures  --Given outpatient evening dose of Labetalol in MAU. Administered at 1816 --Reactive tracing: baseline 145, moderate variability, positive accels, no decels --Toco: rare contractions --No severe range blood pressures. PEC labs normal. Headache mild, slight improvement with 1g Tylenol --Discussed with Dr. Rip Harbour, who advises alteration in Labetalol.   Patient Vitals for the past 24 hrs:  BP Temp Temp src Pulse Resp SpO2 Height Weight  03/22/20 1905 -- -- -- -- 16 -- -- --  03/22/20 1847 (!) 128/50 -- -- 94 -- -- -- --  03/22/20 1831 (!) 153/77 -- -- 92 -- -- -- --  03/22/20 1830 -- -- -- -- -- 100 % -- --  03/22/20 1825  -- -- -- -- -- 99 % -- --  03/22/20 1820 -- -- -- -- -- 100 % -- --  03/22/20 1816 (!) 155/63 -- -- 93 -- -- -- --  03/22/20 1815 -- -- -- -- -- 98 % -- --  03/22/20 1810 -- -- -- -- -- 99 % -- --  03/22/20 1805 -- -- -- -- -- 100 % -- --  03/22/20 1800 -- -- -- -- -- 99 % -- --  03/22/20 1757 (!) 155/75 -- -- 96 -- -- -- --  03/22/20 1755 -- -- -- -- -- 99 % -- --  03/22/20 1740 (!) 150/84 99.2 F (37.3 C) Oral 93 20 100 % 6\' 1"  (1.854 m) (!) 151.5 kg   Results for orders placed or performed during the hospital encounter of 03/22/20 (from the past 24 hour(s))  Protein / creatinine ratio, urine     Status: None   Collection Time: 03/22/20  5:30 PM  Result Value Ref Range   Creatinine, Urine 126.21 mg/dL   Total Protein, Urine 8 mg/dL   Protein Creatinine Ratio 0.06 0.00 - 0.15 mg/mg[Cre]  Comprehensive metabolic panel     Status: Abnormal   Collection Time: 03/22/20  5:50 PM  Result Value Ref Range   Sodium 137 135 - 145 mmol/L   Potassium 3.7 3.5 - 5.1 mmol/L   Chloride 106 98 - 111 mmol/L   CO2 21 (L) 22 - 32 mmol/L   Glucose, Bld 91 70 - 99 mg/dL   BUN 9 6 - 20 mg/dL   Creatinine, Ser 05/22/20 0.44 - 1.00 mg/dL   Calcium 8.9 8.9 - 4.31 mg/dL   Total Protein 5.9 (L) 6.5 - 8.1 g/dL   Albumin 2.7 (L) 3.5 - 5.0 g/dL   AST 17 15 - 41 U/L   ALT 17 0 - 44 U/L   Alkaline Phosphatase 79 38 - 126 U/L   Total Bilirubin 0.3 0.3 - 1.2 mg/dL   GFR calc non Af Amer >60 >60 mL/min   GFR calc Af Amer >60 >60 mL/min   Anion gap 10 5 - 15  CBC     Status: Abnormal   Collection Time: 03/22/20  5:50 PM  Result Value Ref Range   WBC 8.1 4.0 - 10.5 K/uL   RBC 3.85 (L) 3.87 - 5.11 MIL/uL   Hemoglobin 10.2 (L) 12.0 - 15.0 g/dL   HCT 05/22/20 (L) 08.6 - 76.1 %   MCV 83.9 80.0 - 100.0 fL   MCH 26.5 26.0 - 34.0 pg   MCHC 31.6 30.0 - 36.0 g/dL   RDW 95.0 93.2 - 67.1 %   Platelets 218 150 - 400 K/uL   nRBC 0.0 0.0 - 0.2 %   Meds ordered this encounter  Medications  . AND Linked Order Group   .  labetalol (NORMODYNE) injection 20 mg   . labetalol (NORMODYNE) injection 40 mg   . labetalol (NORMODYNE) injection 80 mg   . hydrALAZINE (APRESOLINE) injection 10 mg  . acetaminophen (TYLENOL) tablet 1,000 mg  . labetalol (NORMODYNE) tablet 200 mg    Patient's outpatient evening dose  . labetalol (NORMODYNE) 200 MG tablet    Sig: Take 1 tablet (200 mg total) by mouth 3 (three) times daily.    Dispense:  30 tablet    Refill:  3    Order Specific Question:   Supervising Provider    Answer:   24.5 [[id number]]    Assessment and Plan  --39 y.o. 24 at [redacted]w[redacted]d  --Reactive tracing --Chronic Hypertension, new rx Labetalol 200 mg TID --Discharge home in stable condition  F/U: --[facility] 03/26/2020. Advised switch to in person, patient amenable  05/26/2020, CNM 03/22/2020, 8:03 PM

## 2020-03-26 ENCOUNTER — Ambulatory Visit (INDEPENDENT_AMBULATORY_CARE_PROVIDER_SITE_OTHER): Payer: Medicaid Other | Admitting: Family Medicine

## 2020-03-26 ENCOUNTER — Other Ambulatory Visit: Payer: Self-pay

## 2020-03-26 VITALS — BP 138/89 | HR 108

## 2020-03-26 DIAGNOSIS — O09523 Supervision of elderly multigravida, third trimester: Secondary | ICD-10-CM

## 2020-03-26 DIAGNOSIS — O0991 Supervision of high risk pregnancy, unspecified, first trimester: Secondary | ICD-10-CM

## 2020-03-26 DIAGNOSIS — O24410 Gestational diabetes mellitus in pregnancy, diet controlled: Secondary | ICD-10-CM

## 2020-03-26 DIAGNOSIS — O10919 Unspecified pre-existing hypertension complicating pregnancy, unspecified trimester: Secondary | ICD-10-CM

## 2020-03-26 DIAGNOSIS — O99824 Streptococcus B carrier state complicating childbirth: Secondary | ICD-10-CM

## 2020-03-26 DIAGNOSIS — Z98891 History of uterine scar from previous surgery: Secondary | ICD-10-CM

## 2020-03-26 DIAGNOSIS — O10913 Unspecified pre-existing hypertension complicating pregnancy, third trimester: Secondary | ICD-10-CM

## 2020-03-26 DIAGNOSIS — Z3A36 36 weeks gestation of pregnancy: Secondary | ICD-10-CM

## 2020-03-26 DIAGNOSIS — R8271 Bacteriuria: Secondary | ICD-10-CM

## 2020-03-26 NOTE — Patient Instructions (Signed)
 Breastfeeding  Choosing to breastfeed is one of the [place] decisions you can make for yourself and your baby. A change in hormones during pregnancy causes your breasts to make breast milk in your milk-producing glands. Hormones prevent breast milk from being released before your baby is born. They also prompt milk flow after birth. Once breastfeeding has begun, thoughts of your baby, as well as his or her sucking or crying, can stimulate the release of milk from your milk-producing glands. Benefits of breastfeeding Research shows that breastfeeding offers many health benefits for infants and mothers. It also offers a cost-free and convenient way to feed your baby. For your baby  Your first milk (colostrum) helps your baby's digestive system to function better.  Special cells in your milk (antibodies) help your baby to fight off infections.  Breastfed babies are less likely to develop asthma, allergies, obesity, or type 2 diabetes. They are also at lower risk for sudden infant death syndrome (SIDS).  Nutrients in breast milk are better able to meet your baby's needs compared to infant formula.  Breast milk improves your baby's brain development. For you  Breastfeeding helps to create a very special bond between you and your baby.  Breastfeeding is convenient. Breast milk costs nothing and is always available at the correct temperature.  Breastfeeding helps to burn calories. It helps you to lose the weight that you gained during pregnancy.  Breastfeeding makes your uterus return faster to its size before pregnancy. It also slows bleeding (lochia) after you give birth.  Breastfeeding helps to lower your risk of developing type 2 diabetes, osteoporosis, rheumatoid arthritis, cardiovascular disease, and breast, ovarian, uterine, and endometrial cancer later in life. Breastfeeding basics Starting breastfeeding  Find a comfortable place to sit or lie down, with your neck and back  well-supported.  Place a pillow or a rolled-up blanket under your baby to bring him or her to the level of your breast (if you are seated). Nursing pillows are specially designed to help support your arms and your baby while you breastfeed.  Make sure that your baby's tummy (abdomen) is facing your abdomen.  Gently massage your breast. With your fingertips, massage from the outer edges of your breast inward toward the nipple. This encourages milk flow. If your milk flows slowly, you may need to continue this action during the feeding.  Support your breast with 4 fingers underneath and your thumb above your nipple (make the letter "C" with your hand). Make sure your fingers are well away from your nipple and your baby's mouth.  Stroke your baby's lips gently with your finger or nipple.  When your baby's mouth is open wide enough, quickly bring your baby to your breast, placing your entire nipple and as much of the areola as possible into your baby's mouth. The areola is the colored area around your nipple. ? More areola should be visible above your baby's upper lip than below the lower lip. ? Your baby's lips should be opened and extended outward (flanged) to ensure an adequate, comfortable latch. ? Your baby's tongue should be between his or her lower gum and your breast.  Make sure that your baby's mouth is correctly positioned around your nipple (latched). Your baby's lips should create a seal on your breast and be turned out (everted).  It is common for your baby to suck about 2-3 minutes in order to start the flow of breast milk. Latching Teaching your baby how to latch onto your breast properly   is very important. An improper latch can cause nipple pain, decreased milk supply, and poor weight gain in your baby. Also, if your baby is not latched onto your nipple properly, he or she may swallow some air during feeding. This can make your baby fussy. Burping your baby when you switch breasts  during the feeding can help to get rid of the air. However, teaching your baby to latch on properly is still the [place] way to prevent fussiness from swallowing air while breastfeeding. Signs that your baby has successfully latched onto your nipple  Silent tugging or silent sucking, without causing you pain. Infant's lips should be extended outward (flanged).  Swallowing heard between every 3-4 sucks once your milk has started to flow (after your let-down milk reflex occurs).  Muscle movement above and in front of his or her ears while sucking. Signs that your baby has not successfully latched onto your nipple  Sucking sounds or smacking sounds from your baby while breastfeeding.  Nipple pain. If you think your baby has not latched on correctly, slip your finger into the corner of your baby's mouth to break the suction and place it between your baby's gums. Attempt to start breastfeeding again. Signs of successful breastfeeding Signs from your baby  Your baby will gradually decrease the number of sucks or will completely stop sucking.  Your baby will fall asleep.  Your baby's body will relax.  Your baby will retain a small amount of milk in his or her mouth.  Your baby will let go of your breast by himself or herself. Signs from you  Breasts that have increased in firmness, weight, and size 1-3 hours after feeding.  Breasts that are softer immediately after breastfeeding.  Increased milk volume, as well as a change in milk consistency and color by the fifth day of breastfeeding.  Nipples that are not sore, cracked, or bleeding. Signs that your baby is getting enough milk  Wetting at least 1-2 diapers during the first 24 hours after birth.  Wetting at least 5-6 diapers every 24 hours for the first week after birth. The urine should be clear or pale yellow by the age of 5 days.  Wetting 6-8 diapers every 24 hours as your baby continues to grow and develop.  At least 3 stools in  a 24-hour period by the age of 5 days. The stool should be soft and yellow.  At least 3 stools in a 24-hour period by the age of 7 days. The stool should be seedy and yellow.  No loss of weight greater than 10% of birth weight during the first 3 days of life.  Average weight gain of 4-7 oz (113-198 g) per week after the age of 4 days.  Consistent daily weight gain by the age of 5 days, without weight loss after the age of 2 weeks. After a feeding, your baby may spit up a small amount of milk. This is normal. Breastfeeding frequency and duration Frequent feeding will help you make more milk and can prevent sore nipples and extremely full breasts (breast engorgement). Breastfeed when you feel the need to reduce the fullness of your breasts or when your baby shows signs of hunger. This is called "breastfeeding on demand." Signs that your baby is hungry include:  Increased alertness, activity, or restlessness.  Movement of the head from side to side.  Opening of the mouth when the corner of the mouth or cheek is stroked (rooting).  Increased sucking sounds, smacking lips,   cooing, sighing, or squeaking.  Hand-to-mouth movements and sucking on fingers or hands.  Fussing or crying. Avoid introducing a pacifier to your baby in the first 4-6 weeks after your baby is born. After this time, you may choose to use a pacifier. Research has shown that pacifier use during the first year of a baby's life decreases the risk of sudden infant death syndrome (SIDS). Allow your baby to feed on each breast as long as he or she wants. When your baby unlatches or falls asleep while feeding from the first breast, offer the second breast. Because newborns are often sleepy in the first few weeks of life, you may need to awaken your baby to get him or her to feed. Breastfeeding times will vary from baby to baby. However, the following rules can serve as a guide to help you make sure that your baby is properly  fed:  Newborns (babies 4 weeks of age or younger) may breastfeed every 1-3 hours.  Newborns should not go without breastfeeding for longer than 3 hours during the day or 5 hours during the night.  You should breastfeed your baby a minimum of 8 times in a 24-hour period. Breast milk pumping     Pumping and storing breast milk allows you to make sure that your baby is exclusively fed your breast milk, even at times when you are unable to breastfeed. This is especially important if you go back to work while you are still breastfeeding, or if you are not able to be present during feedings. Your lactation consultant can help you find a method of pumping that works [place] for you and give you guidelines about how long it is safe to store breast milk. Caring for your breasts while you breastfeed Nipples can become dry, cracked, and sore while breastfeeding. The following recommendations can help keep your breasts moisturized and healthy:  Avoid using soap on your nipples.  Wear a supportive bra designed especially for nursing. Avoid wearing underwire-style bras or extremely tight bras (sports bras).  Air-dry your nipples for 3-4 minutes after each feeding.  Use only cotton bra pads to absorb leaked breast milk. Leaking of breast milk between feedings is normal.  Use lanolin on your nipples after breastfeeding. Lanolin helps to maintain your skin's normal moisture barrier. Pure lanolin is not harmful (not toxic) to your baby. You may also hand express a few drops of breast milk and gently massage that milk into your nipples and allow the milk to air-dry. In the first few weeks after giving birth, some women experience breast engorgement. Engorgement can make your breasts feel heavy, warm, and tender to the touch. Engorgement peaks within 3-5 days after you give birth. The following recommendations can help to ease engorgement:  Completely empty your breasts while breastfeeding or pumping. You may  want to start by applying warm, moist heat (in the shower or with warm, water-soaked hand towels) just before feeding or pumping. This increases circulation and helps the milk flow. If your baby does not completely empty your breasts while breastfeeding, pump any extra milk after he or she is finished.  Apply ice packs to your breasts immediately after breastfeeding or pumping, unless this is too uncomfortable for you. To do this: ? Put ice in a plastic bag. ? Place a towel between your skin and the bag. ? Leave the ice on for 20 minutes, 2-3 times a day.  Make sure that your baby is latched on and positioned properly while breastfeeding.   If engorgement persists after 48 hours of following these recommendations, contact your health care provider or a lactation consultant. Overall health care recommendations while breastfeeding  Eat 3 healthy meals and 3 snacks every day. Well-nourished mothers who are breastfeeding need an additional 450-500 calories a day. You can meet this requirement by increasing the amount of a balanced diet that you eat.  Drink enough water to keep your urine pale yellow or clear.  Rest often, relax, and continue to take your prenatal vitamins to prevent fatigue, stress, and low vitamin and mineral levels in your body (nutrient deficiencies).  Do not use any products that contain nicotine or tobacco, such as cigarettes and e-cigarettes. Your baby may be harmed by chemicals from cigarettes that pass into breast milk and exposure to secondhand smoke. If you need help quitting, ask your health care provider.  Avoid alcohol.  Do not use illegal drugs or marijuana.  Talk with your health care provider before taking any medicines. These include over-the-counter and prescription medicines as well as vitamins and herbal supplements. Some medicines that may be harmful to your baby can pass through breast milk.  It is possible to become pregnant while breastfeeding. If birth  control is desired, ask your health care provider about options that will be safe while breastfeeding your baby. Where to find more information: [employer]: www.llli.org Contact a health care provider if:  You feel like you want to stop breastfeeding or have become frustrated with breastfeeding.  Your nipples are cracked or bleeding.  Your breasts are red, tender, or warm.  You have: ? Painful breasts or nipples. ? A swollen area on either breast. ? A fever or chills. ? Nausea or vomiting. ? Drainage other than breast milk from your nipples.  Your breasts do not become full before feedings by the fifth day after you give birth.  You feel sad and depressed.  Your baby is: ? Too sleepy to eat well. ? Having trouble sleeping. ? More than 1 week old and wetting fewer than 6 diapers in a 24-hour period. ? Not gaining weight by 5 days of age.  Your baby has fewer than 3 stools in a 24-hour period.  Your baby's skin or the white parts of his or her eyes become yellow. Get help right away if:  Your baby is overly tired (lethargic) and does not want to wake up and feed.  Your baby develops an unexplained fever. Summary  Breastfeeding offers many health benefits for infant and mothers.  Try to breastfeed your infant when he or she shows early signs of hunger.  Gently tickle or stroke your baby's lips with your finger or nipple to allow the baby to open his or her mouth. Bring the baby to your breast. Make sure that much of the areola is in your baby's mouth. Offer one side and burp the baby before you offer the other side.  Talk with your health care provider or lactation consultant if you have questions or you face problems as you breastfeed. This information is not intended to replace advice given to you by your health care provider. Make sure you discuss any questions you have with your health care provider. Document Revised: 12/30/2017 Document Reviewed:  11/06/2016 Elsevier Patient Education  2020 Elsevier Inc.  

## 2020-03-27 ENCOUNTER — Encounter: Payer: Self-pay | Admitting: Family Medicine

## 2020-03-27 NOTE — Progress Notes (Signed)
   PRENATAL VISIT NOTE  Subjective:  [name] is a 39 y.o. [id number] at [redacted]w[redacted]d being seen today for ongoing prenatal care.  She is currently monitored for the following issues for this high-risk pregnancy and has GERD; Acquired cyst of kidney; Anxiety with flying; Chronic hypertension affecting pregnancy; Chronic fatigue; AMA (advanced maternal age) multigravida 35+; History of VBAC; Vitamin D insufficiency; Gestational diabetes mellitus (GDM) in third trimester; Obesity in pregnancy, antepartum; Episodic paroxysmal anxiety disorder; Supervision of high risk pregnancy, antepartum, first trimester; History of gestational diabetes; Atypical squamous cells cannot exclude high grade squamous intraepithelial lesion on cytologic smear of cervix (ASC-H); Depression, major, single episode; Chest pain; Group B streptococcal bacteriuria; BMI 40.0-44.9, adult (HCC); and Fetal right renal solitary cyst on their problem list.  Patient reports no complaints.  Contractions: Not present. Vag. Bleeding: None.  Movement: Present. Denies leaking of fluid.   The following portions of the patient's history were reviewed and updated as appropriate: allergies, current medications, past family history, past medical history, past social history, past surgical history and problem list.   Objective:   Vitals:   03/26/20 1137 03/26/20 1210  BP: (!) 148/84 138/89  Pulse: (!) 108     Fetal Status: Fetal Heart Rate (bpm): 153   Movement: Present     General:  Alert, oriented and cooperative. Patient is in no acute distress.  Skin: Skin is warm and dry. No rash noted.   Cardiovascular: Normal heart rate noted  Respiratory: Normal respiratory effort, no problems with respiration noted  Abdomen: Soft, gravid, appropriate for gestational age.  Pain/Pressure: Present     Pelvic: Cervical exam deferred        Extremities: Normal range of motion.  Edema: Deep pitting, indentation remains for a short time  Mental Status:  Normal mood and affect. Normal behavior. Normal judgment and thought content.   Assessment and Plan:  Pregnancy: [id number] at [redacted]w[redacted]d 1. Chronic hypertension affecting pregnancy Has had to increase her BP med to tid. At the time, having headache and normal labs. Offered 37 week IOL and she declines this. IOL sometime between 37-39 weeks and sooner if having to escalate meds again. She is agreeable.   2. Diet controlled gestational diabetes mellitus (GDM) in third trimester Continue diet  3. Multigravida of advanced maternal age in third trimester Declined genetics, nml u/s  4. Group B streptococcal bacteriuria Will need treatment in labor  5. History of VBAC Consent signed  6. Supervision of high risk pregnancy, antepartum, third trimester   Preterm labor symptoms and general obstetric precautions including but not limited to vaginal bleeding, contractions, leaking of fluid and fetal movement were reviewed in detail with the patient. Please refer to After Visit Summary for other counseling recommendations.   Return in 1 week (on 04/02/2020).  Future Appointments  Date Time Provider Department Center  03/28/2020  9:15 AM [facility] NURSE [facility]  03/28/2020  9:15 AM [facility] US2 [facility]  04/01/2020  4:15 PM Reva Bores, MD [facility]  04/04/2020  3:45 PM [facility] NURSE [facility]  04/04/2020  3:45 PM [facility] US1 [facility]  04/11/2020 10:30 AM Laughlin Bing, MD [facility]  04/11/2020 12:45 PM [facility] NURSE [facility]  04/11/2020 12:45 PM [facility] US5 [facility]    Reva Bores, MD

## 2020-03-28 ENCOUNTER — Other Ambulatory Visit: Payer: Self-pay

## 2020-03-28 ENCOUNTER — Ambulatory Visit: Payer: Medicaid Other

## 2020-03-28 ENCOUNTER — Ambulatory Visit: Payer: Medicaid Other | Attending: Obstetrics and Gynecology

## 2020-03-28 ENCOUNTER — Ambulatory Visit: Payer: Medicaid Other | Admitting: *Deleted

## 2020-03-28 VITALS — BP 126/85 | HR 97

## 2020-03-28 DIAGNOSIS — O09293 Supervision of pregnancy with other poor reproductive or obstetric history, third trimester: Secondary | ICD-10-CM

## 2020-03-28 DIAGNOSIS — O10913 Unspecified pre-existing hypertension complicating pregnancy, third trimester: Secondary | ICD-10-CM | POA: Diagnosis not present

## 2020-03-28 DIAGNOSIS — O09523 Supervision of elderly multigravida, third trimester: Secondary | ICD-10-CM | POA: Diagnosis not present

## 2020-03-28 DIAGNOSIS — Z3A36 36 weeks gestation of pregnancy: Secondary | ICD-10-CM | POA: Diagnosis not present

## 2020-03-28 DIAGNOSIS — E669 Obesity, unspecified: Secondary | ICD-10-CM

## 2020-03-28 DIAGNOSIS — O09213 Supervision of pregnancy with history of pre-term labor, third trimester: Secondary | ICD-10-CM

## 2020-03-28 DIAGNOSIS — R8271 Bacteriuria: Secondary | ICD-10-CM

## 2020-03-28 DIAGNOSIS — O34219 Maternal care for unspecified type scar from previous cesarean delivery: Secondary | ICD-10-CM

## 2020-03-28 DIAGNOSIS — O99213 Obesity complicating pregnancy, third trimester: Secondary | ICD-10-CM

## 2020-03-28 DIAGNOSIS — O10919 Unspecified pre-existing hypertension complicating pregnancy, unspecified trimester: Secondary | ICD-10-CM | POA: Diagnosis not present

## 2020-03-28 DIAGNOSIS — Z362 Encounter for other antenatal screening follow-up: Secondary | ICD-10-CM | POA: Diagnosis not present

## 2020-03-29 ENCOUNTER — Encounter: Payer: Self-pay | Admitting: Family Medicine

## 2020-03-29 NOTE — Progress Notes (Signed)
Induction Assessment Scheduling Form: Fax to [facility]:  [phone] Route to [facility] Labor Delivery   [name]                                                                                   DOB:  [birth year]                                                            MRN:  [id number]  Phone:  Home Phone [phone]  Mobile [phone]    Provider:  [facility] ([facility])  Admission Date/Time:  04/04/2020 GP:  [id number]     Gestational age on admission: 37 4/7                                                 Estimated Date of Delivery: 04/22/20  Dating Criteria: LMP  There were no vitals filed for this visit.  GBS:  Positive in urine HIV:  Non Reactive (03/29 1534)  Reason for induction:  CHTN with superimposed pre-eclampsia, worsening BP Scheduling Provider Signature:  Reva Bores, MD         Method of induction(proposed):  Foley - inpatient + Pitocin   Scheduling Provider Signature:  Reva Bores, MD                                            Today's Date:  03/29/2020

## 2020-03-31 ENCOUNTER — Encounter (HOSPITAL_COMMUNITY): Payer: Self-pay | Admitting: Obstetrics & Gynecology

## 2020-03-31 ENCOUNTER — Inpatient Hospital Stay (HOSPITAL_COMMUNITY)
Admission: RE | Admit: 2020-03-31 | Discharge: 2020-04-02 | DRG: 807 | Disposition: A | Discharge status: Home / Self Care | Payer: Medicaid Other | Attending: Family Medicine | Admitting: Family Medicine

## 2020-03-31 ENCOUNTER — Other Ambulatory Visit: Payer: Self-pay

## 2020-03-31 DIAGNOSIS — I10 Essential (primary) hypertension: Secondary | ICD-10-CM | POA: Diagnosis present

## 2020-03-31 DIAGNOSIS — O1092 Unspecified pre-existing hypertension complicating childbirth: Secondary | ICD-10-CM | POA: Diagnosis not present

## 2020-03-31 DIAGNOSIS — O10913 Unspecified pre-existing hypertension complicating pregnancy, third trimester: Secondary | ICD-10-CM | POA: Diagnosis not present

## 2020-03-31 DIAGNOSIS — Z3A36 36 weeks gestation of pregnancy: Secondary | ICD-10-CM

## 2020-03-31 DIAGNOSIS — Z3A37 37 weeks gestation of pregnancy: Secondary | ICD-10-CM | POA: Diagnosis not present

## 2020-03-31 DIAGNOSIS — Z7982 Long term (current) use of aspirin: Secondary | ICD-10-CM | POA: Diagnosis not present

## 2020-03-31 DIAGNOSIS — O24420 Gestational diabetes mellitus in childbirth, diet controlled: Secondary | ICD-10-CM | POA: Diagnosis present

## 2020-03-31 DIAGNOSIS — O1002 Pre-existing essential hypertension complicating childbirth: Secondary | ICD-10-CM | POA: Diagnosis present

## 2020-03-31 DIAGNOSIS — O139 Gestational [pregnancy-induced] hypertension without significant proteinuria, unspecified trimester: Secondary | ICD-10-CM | POA: Diagnosis present

## 2020-03-31 DIAGNOSIS — O99210 Obesity complicating pregnancy, unspecified trimester: Secondary | ICD-10-CM | POA: Diagnosis present

## 2020-03-31 DIAGNOSIS — F329 Major depressive disorder, single episode, unspecified: Secondary | ICD-10-CM | POA: Diagnosis present

## 2020-03-31 DIAGNOSIS — Z3689 Encounter for other specified antenatal screening: Secondary | ICD-10-CM

## 2020-03-31 DIAGNOSIS — O99344 Other mental disorders complicating childbirth: Secondary | ICD-10-CM | POA: Diagnosis present

## 2020-03-31 DIAGNOSIS — O24419 Gestational diabetes mellitus in pregnancy, unspecified control: Secondary | ICD-10-CM | POA: Diagnosis present

## 2020-03-31 DIAGNOSIS — O358XX0 Maternal care for other (suspected) fetal abnormality and damage, not applicable or unspecified: Secondary | ICD-10-CM | POA: Diagnosis present

## 2020-03-31 DIAGNOSIS — Z8616 Personal history of COVID-19: Secondary | ICD-10-CM

## 2020-03-31 DIAGNOSIS — O34219 Maternal care for unspecified type scar from previous cesarean delivery: Secondary | ICD-10-CM | POA: Diagnosis present

## 2020-03-31 DIAGNOSIS — O99824 Streptococcus B carrier state complicating childbirth: Secondary | ICD-10-CM | POA: Diagnosis not present

## 2020-03-31 DIAGNOSIS — R8271 Bacteriuria: Secondary | ICD-10-CM | POA: Diagnosis present

## 2020-03-31 DIAGNOSIS — E669 Obesity, unspecified: Secondary | ICD-10-CM | POA: Diagnosis present

## 2020-03-31 DIAGNOSIS — O10919 Unspecified pre-existing hypertension complicating pregnancy, unspecified trimester: Secondary | ICD-10-CM

## 2020-03-31 DIAGNOSIS — F419 Anxiety disorder, unspecified: Secondary | ICD-10-CM | POA: Diagnosis present

## 2020-03-31 DIAGNOSIS — O164 Unspecified maternal hypertension, complicating childbirth: Secondary | ICD-10-CM | POA: Diagnosis not present

## 2020-03-31 DIAGNOSIS — O99214 Obesity complicating childbirth: Secondary | ICD-10-CM | POA: Diagnosis present

## 2020-03-31 DIAGNOSIS — Z8632 Personal history of gestational diabetes: Secondary | ICD-10-CM | POA: Diagnosis present

## 2020-03-31 DIAGNOSIS — O24429 Gestational diabetes mellitus in childbirth, unspecified control: Secondary | ICD-10-CM | POA: Diagnosis not present

## 2020-03-31 DIAGNOSIS — Z98891 History of uterine scar from previous surgery: Secondary | ICD-10-CM

## 2020-03-31 DIAGNOSIS — O09529 Supervision of elderly multigravida, unspecified trimester: Secondary | ICD-10-CM

## 2020-03-31 LAB — URINALYSIS, ROUTINE W REFLEX MICROSCOPIC
Bacteria, UA: NONE SEEN
Bilirubin Urine: NEGATIVE
Glucose, UA: NEGATIVE mg/dL
Hgb urine dipstick: NEGATIVE
Ketones, ur: 5 mg/dL — AB
Leukocytes,Ua: NEGATIVE
Nitrite: NEGATIVE
Protein, ur: 30 mg/dL — AB
Specific Gravity, Urine: 1.025 (ref 1.005–1.030)
pH: 5 (ref 5.0–8.0)

## 2020-03-31 LAB — COMPREHENSIVE METABOLIC PANEL
ALT: 12 U/L (ref 0–44)
AST: 15 U/L (ref 15–41)
Albumin: 2.5 g/dL — ABNORMAL LOW (ref 3.5–5.0)
Alkaline Phosphatase: 72 U/L (ref 38–126)
Anion gap: 10 (ref 5–15)
BUN: 11 mg/dL (ref 6–20)
CO2: 21 mmol/L — ABNORMAL LOW (ref 22–32)
Calcium: 8.7 mg/dL — ABNORMAL LOW (ref 8.9–10.3)
Chloride: 105 mmol/L (ref 98–111)
Creatinine, Ser: 0.74 mg/dL (ref 0.44–1.00)
GFR calc Af Amer: >60 mL/min (ref >60)
GFR calc non Af Amer: >60 mL/min (ref >60)
Glucose, Bld: 113 mg/dL — ABNORMAL HIGH (ref 70–99)
Potassium: 3.9 mmol/L (ref 3.5–5.1)
Sodium: 136 mmol/L (ref 135–145)
Total Bilirubin: 0.5 mg/dL (ref 0.3–1.2)
Total Protein: 5.2 g/dL — ABNORMAL LOW (ref 6.5–8.1)

## 2020-03-31 LAB — CBC
HCT: 29.9 % — ABNORMAL LOW (ref 36.0–46.0)
HCT: 30.6 % — ABNORMAL LOW (ref 36.0–46.0)
Hemoglobin: 9.2 g/dL — ABNORMAL LOW (ref 12.0–15.0)
Hemoglobin: 9.6 g/dL — ABNORMAL LOW (ref 12.0–15.0)
MCH: 25.9 pg — ABNORMAL LOW (ref 26.0–34.0)
MCH: 26.2 pg (ref 26.0–34.0)
MCHC: 30.8 g/dL (ref 30.0–36.0)
MCHC: 31.4 g/dL (ref 30.0–36.0)
MCV: 84.2 fL (ref 80.0–100.0)
MCV: 83.4 fL (ref 80.0–100.0)
Platelets: 194 10*3/uL (ref 150–400)
Platelets: 227 10*3/uL (ref 150–400)
RBC: 3.55 MIL/uL — ABNORMAL LOW (ref 3.87–5.11)
RBC: 3.67 MIL/uL — ABNORMAL LOW (ref 3.87–5.11)
RDW: 14.3 % (ref 11.5–15.5)
RDW: 14.4 % (ref 11.5–15.5)
WBC: 7 10*3/uL (ref 4.0–10.5)
WBC: 8 10*3/uL (ref 4.0–10.5)
nRBC: 0 % (ref 0.0–0.2)
nRBC: 0 % (ref 0.0–0.2)

## 2020-03-31 LAB — PROTEIN / CREATININE RATIO, URINE
Creatinine, Urine: 259.65 mg/dL
Protein Creatinine Ratio: 0.08 mg/mg{Cre} (ref 0.00–0.15)
Total Protein, Urine: 22 mg/dL

## 2020-03-31 LAB — TYPE AND SCREEN
ABO/RH(D): B POS
Antibody Screen: NEGATIVE

## 2020-03-31 LAB — GLUCOSE, CAPILLARY
Glucose-Capillary: 90 mg/dL (ref 70–99)
Glucose-Capillary: 89 mg/dL (ref 70–99)

## 2020-03-31 LAB — ABO/RH: ABO/RH(D): B POS

## 2020-03-31 MED ORDER — LACTATED RINGERS IV SOLN: INTRAVENOUS | Status: DC

## 2020-03-31 MED ORDER — FENTANYL CITRATE (PF) 100 MCG/2ML IJ SOLN
INTRAMUSCULAR | Status: AC
  Filled 2020-03-31: qty 2

## 2020-03-31 MED ORDER — TRANEXAMIC ACID-NACL 1000-0.7 MG/100ML-% IV SOLN
1000.0000 mg | INTRAVENOUS | Status: AC
  Administered 2020-04-01: 1000 mg via INTRAVENOUS
  Filled 2020-03-31: qty 100

## 2020-03-31 MED ORDER — LABETALOL HCL 200 MG PO TABS
200.0000 mg | ORAL_TABLET | Freq: Three times a day (TID) | ORAL | Status: DC
  Administered 2020-03-31: 200 mg via ORAL
  Filled 2020-03-31: qty 1

## 2020-03-31 MED ORDER — OXYTOCIN-SODIUM CHLORIDE 30-0.9 UT/500ML-% IV SOLN
1.0000 m[IU]/min | INTRAVENOUS | Status: DC
  Administered 2020-03-31: 1 m[IU]/min via INTRAVENOUS
  Filled 2020-03-31: qty 500

## 2020-03-31 MED ORDER — SODIUM CHLORIDE 0.9 % IV SOLN
5.0000 10*6.[IU] | Freq: Once | INTRAVENOUS | Status: AC
  Administered 2020-03-31: 5 10*6.[IU] via INTRAVENOUS
  Filled 2020-03-31: qty 5

## 2020-03-31 MED ORDER — PHENYLEPHRINE 40 MCG/ML (10ML) SYRINGE FOR IV PUSH (FOR BLOOD PRESSURE SUPPORT): 80.0000 ug | PREFILLED_SYRINGE | INTRAVENOUS | Status: DC | PRN

## 2020-03-31 MED ORDER — OXYCODONE-ACETAMINOPHEN 5-325 MG PO TABS: 2.0000 | ORAL_TABLET | ORAL | Status: DC | PRN

## 2020-03-31 MED ORDER — EPHEDRINE 5 MG/ML INJ: 10.0000 mg | INTRAVENOUS | Status: DC | PRN

## 2020-03-31 MED ORDER — ACETAMINOPHEN 325 MG PO TABS: 650.0000 mg | ORAL_TABLET | ORAL | Status: DC | PRN

## 2020-03-31 MED ORDER — FLEET ENEMA 7-19 GM/118ML RE ENEM: 1.0000 | ENEMA | RECTAL | Status: DC | PRN

## 2020-03-31 MED ORDER — BUTALBITAL-APAP-CAFFEINE 50-325-40 MG PO TABS
2.0000 | ORAL_TABLET | Freq: Once | ORAL | Status: AC
  Administered 2020-03-31: 2 via ORAL
  Filled 2020-03-31: qty 2

## 2020-03-31 MED ORDER — OXYTOCIN-SODIUM CHLORIDE 30-0.9 UT/500ML-% IV SOLN: 2.5000 [IU]/h | INTRAVENOUS | Status: DC

## 2020-03-31 MED ORDER — SOD CITRATE-CITRIC ACID 500-334 MG/5ML PO SOLN: 30.0000 mL | ORAL | Status: DC | PRN

## 2020-03-31 MED ORDER — OXYTOCIN BOLUS FROM INFUSION
500.0000 mL | Freq: Once | INTRAVENOUS | Status: AC
  Administered 2020-04-01: 500 mL via INTRAVENOUS

## 2020-03-31 MED ORDER — LIDOCAINE HCL (PF) 1 % IJ SOLN: 30.0000 mL | INTRAMUSCULAR | Status: DC | PRN

## 2020-03-31 MED ORDER — ONDANSETRON HCL 4 MG/2ML IJ SOLN: 4.0000 mg | Freq: Four times a day (QID) | INTRAMUSCULAR | Status: DC | PRN

## 2020-03-31 MED ORDER — LABETALOL HCL 200 MG PO TABS: 400.0000 mg | ORAL_TABLET | Freq: Three times a day (TID) | ORAL | 1 refills | Status: DC

## 2020-03-31 MED ORDER — DIPHENHYDRAMINE HCL 50 MG/ML IJ SOLN: 12.5000 mg | INTRAMUSCULAR | Status: DC | PRN

## 2020-03-31 MED ORDER — LACTATED RINGERS IV SOLN: 500.0000 mL | INTRAVENOUS | Status: DC | PRN

## 2020-03-31 MED ORDER — PENICILLIN G POT IN DEXTROSE 60000 UNIT/ML IV SOLN
3.0000 10*6.[IU] | INTRAVENOUS | Status: DC
  Administered 2020-03-31 – 2020-04-01 (×3): 3 10*6.[IU] via INTRAVENOUS
  Filled 2020-03-31 (×3): qty 50

## 2020-03-31 MED ORDER — FENTANYL CITRATE (PF) 100 MCG/2ML IJ SOLN
100.0000 ug | INTRAMUSCULAR | Status: DC | PRN
  Administered 2020-03-31 (×3): 100 ug via INTRAVENOUS
  Filled 2020-03-31 (×2): qty 2

## 2020-03-31 MED ORDER — FENTANYL-BUPIVACAINE-NACL 0.5-0.125-0.9 MG/250ML-% EP SOLN
12.0000 mL/h | EPIDURAL | Status: DC | PRN
  Filled 2020-03-31: qty 250

## 2020-03-31 MED ORDER — OXYCODONE-ACETAMINOPHEN 5-325 MG PO TABS: 1.0000 | ORAL_TABLET | ORAL | Status: DC | PRN

## 2020-03-31 MED ORDER — LACTATED RINGERS IV SOLN: 500.0000 mL | Freq: Once | INTRAVENOUS | Status: DC

## 2020-03-31 MED ORDER — TERBUTALINE SULFATE 1 MG/ML IJ SOLN: 0.2500 mg | Freq: Once | INTRAMUSCULAR | Status: DC | PRN

## 2020-03-31 NOTE — MAU Note (Signed)
Patient reports to MAU c/o HBP. Pt states around 0200 she woke up not feeling well pt states she was experiencing some nausea and feeling dizzy (even while lying in bed). Pt states she took her BP and it was 180/110. Pt proceeded to take her labetalol 200 mg and rechecked her BP in 1 hr and it was 140/90. Pt was instructed to come per call a nurse line. Pt states she took a tylenol along with the labetalol at 0245 and she states her HA is not all the way resolved but states it did help some but not a lot.  Pt states her vision is "weird." pt denies bleeding or LOF. +FM.

## 2020-03-31 NOTE — Progress Notes (Signed)
[name] is a 39 y.o. [id number] at [redacted]w[redacted]d admitted for IOL worsening cHTN  Subjective: Comfortable, not really feeling contractions  Objective: BP (!) 142/68   Pulse 75   Temp 98.2 F (36.8 C) (Oral)   Resp 16   Ht 6\' 1"  (1.854 m)   Wt (!) 153.5 kg   LMP 07/17/2019 (LMP Unknown)   BMI 44.63 kg/m  Total I/O In: 388.7 [I.V.:388.7] Out: -   FHT:  FHR: 135 bpm, variability: moderate-marked,  accelerations:  Present,  decelerations:  Absent UC:   irregular, every 4-5 minutes  SVE:   Dilation: 1 Effacement (%): 70 Station: -3 Exam by:: Dr. [email or web address]   Head not felt on SVE BSUS, elbow presenting part  Pitocin @ 8 mu/min  Labs: Lab Results  Component Value Date   WBC 7.0 03/31/2020   HGB 9.2 (L) 03/31/2020   HCT 29.9 (L) 03/31/2020   MCV 84.2 03/31/2020   PLT 194 03/31/2020    Assessment / Plan: 39 yo 24 at 36.3 EGA with worsening cHTN here for IOL.  Labor: s/p FB. Pit currently on 8. Elbow felt on SVE, most liekly unstable lie- BSUS shows elbow at cervical Os. Pit turned off, hands and knees position, then high fowlers to try to reposition baby. Will recheck in 1 hour, consider abdominal binder if baby Vertex. Fetal Wellbeing:  Category I Pain Control:  per patient request Worsening cHTN: BP not severe range, have been better on L&D. Mag held off, continue to monitor. I/D:  GBS pos, PCN Anticipated MOD:  vaginal  Platon Arocho L Almena Hokenson DO OB Fellow, Faculty Practice 03/31/2020, 2:15 PM

## 2020-03-31 NOTE — Progress Notes (Addendum)
[name] is a 39 y.o. [id number] at 49w6dadmitted for IOL 2/2 worsening hypertension  Subjective: Met patient and discussed plan. Very comfortable.   Objective: BP 123/74   Pulse 83   Temp 98.7 F (37.1 C) (Oral)   Resp 18   Ht 6' 1"  (1.854 m)   Wt (!) 153.5 kg   LMP 07/17/2019 (LMP Unknown)   BMI 44.63 kg/m  No intake/output data recorded.  FHT:  FHR: 130 bpm, variability: moderate,  accelerations:  Present,  decelerations:  Absent UC:   None tracing currently   SVE:   Dilation: 3 Effacement (%): 70 Station: -3 Exam by:: Dr. PKennon Rounds Labs: Lab Results  Component Value Date   WBC 8.0 03/31/2020   HGB 9.6 (L) 03/31/2020   HCT 30.6 (L) 03/31/2020   MCV 83.4 03/31/2020   PLT 227 03/31/2020    Assessment / Plan: 39yo [id number] 36.3 EGA with worsening cHTN here for IOL.  Labor: s/p FB. VTX by BSUS and SVE. FSE placed. S/p AROM. Cont Pitocin. Consider IUPC at next check. Anticipate VBAC.  Fetal Wellbeing:  Category I Pain Control:  per patient request Worsening cHTN: One severe range pressure, right after cervical check. Mag held off, continue to monitor. Has not been on home Labetalol; last dose 0245. Will restart meds. Pr/Cr and CMP WNL. I/D:  GBS pos, PCN  CChauncey Mann, MD OB Fellow, Faculty Practice 03/31/2020, 8:47 PM

## 2020-03-31 NOTE — Progress Notes (Signed)
Patient states she did not take the glucose tolerance test but took a more thorough test and it should be in her chart. She states she will not allow any more fingersticks to be done because she knows she is not a gestational diabetic.

## 2020-03-31 NOTE — Progress Notes (Signed)
[name] is a 39 y.o. [id number] at [redacted]w[redacted]d admitted for IOL 2/2 worsening hypertension  Subjective: Still feeling few ctx. Agreeable to IUPC.   Objective: BP (!) 124/58    Pulse 78    Temp 98.5 F (36.9 C) (Oral)    Resp 18    Ht 6\' 1"  (1.854 m)    Wt (!) 153.5 kg    LMP 07/17/2019 (LMP Unknown)    BMI 44.63 kg/m  No intake/output data recorded.  FHT:  FHR: 130 bpm, variability: moderate,  accelerations:  Present,  decelerations:  Absent UC:   None currently   SVE:   Dilation: 5 Effacement (%): 70 Station: -3 Exam by:: Ashima Shrake  Labs: Lab Results  Component Value Date   WBC 8.0 03/31/2020   HGB 9.6 (L) 03/31/2020   HCT 30.6 (L) 03/31/2020   MCV 83.4 03/31/2020   PLT 227 03/31/2020    Assessment / Plan: 39 yo 24 at 36.3 EGA with worsening cHTN here for IOL.  Labor: s/p FB. S/p AROM. IUPC placed left anterior due to Pit now at 20 and not tracing ctx. Cont to titrate. Anticipate VBAC.  Fetal Wellbeing:  Category I Pain Control:  per patient request Worsening cHTN: One severe range pressure, right after cervical check. Mag held off, continue to monitor. Has not been on home Labetalol; last dose 0245. Restarted home Labetalol med. Pr/Cr and CMP WNL. I/D:  GBS pos, PCN  [id number] , MD OB Fellow, Faculty Practice 03/31/2020, 11:35 PM

## 2020-03-31 NOTE — Progress Notes (Signed)
[name] is a 39 y.o. [id number] at [redacted]w[redacted]d admitted for IOL 2/2 worsening hypertension  Subjective: Feeling baby move  Objective: BP (!) 148/93   Pulse 100   Temp 98.2 F (36.8 C) (Oral)   Resp 18   Ht 6\' 1"  (1.854 m)   Wt (!) 153.5 kg   LMP 07/17/2019 (LMP Unknown)   BMI 44.63 kg/m  Total I/O In: 388.7 [I.V.:388.7] Out: -   FHT:  FHR: 120 bpm, variability: moderate,  accelerations:  Present,  decelerations:  Absent UC:   none  SVE:   Dilation: 3 Effacement (%): 70 Station: -3 Exam by:: Dr. [email or web address]  Labs: Lab Results  Component Value Date   WBC 7.0 03/31/2020   HGB 9.2 (L) 03/31/2020   HCT 29.9 (L) 03/31/2020   MCV 84.2 03/31/2020   PLT 194 03/31/2020    Assessment / Plan: 39 yo 24 at 36.3 EGA with worsening cHTN here for IOL.  Labor: s/p FB. VTX by BSUS and SVE. FSE placed. Pitocin restarted. Fetal Wellbeing:  Category I Pain Control:  per patient request Worsening cHTN: One severe range pressure, right after cervical check. Mag held off, continue to monitor. I/D:  GBS pos, PCN Anticipated MOD:  vaginal  Kazuto Sevey L Dann Ventress DO OB Fellow, [facility] 03/31/2020, 5:00 PM

## 2020-03-31 NOTE — Progress Notes (Signed)
   [name] is a 39 y.o. [id number] at [redacted]w[redacted]d  admitted for induction of labor due to worsening CHTN.  Subjective: Patient comfortable in bed in high fowlers  Objective: Vitals:   03/31/20 1336 03/31/20 1400 03/31/20 1510 03/31/20 1530  BP: (!) 142/68 (!) 148/75 (!) 144/78 (!) 157/84  Pulse: 75 77 93 91  Resp: 16 18 18 16   Temp:      TempSrc:      Weight:      Height:       Total I/O In: 388.7 [I.V.:388.7] Out: -   FHT:  FHR: 130 bpm, variability: moderate,  accelerations:  Present,  decelerations:  Absent UC:   irregular, irratability SVE:   Dilation: 3 Effacement (%): 70 Station: -3 Exam by:: Dr. [email or web address] Pitocin at 0 mu/min Labs: Lab Results  Component Value Date   WBC 7.0 03/31/2020   HGB 9.2 (L) 03/31/2020   HCT 29.9 (L) 03/31/2020   MCV 84.2 03/31/2020   PLT 194 03/31/2020    Assessment / Plan: Patient does not want to be checked right now to determine presenting part; 04/02/2020 shows Head is cephalic but off to maternal left. Patient would like to be checked when Dr. [place] and attending are out of the OR; at that time we will reassess with possible AROM, IUPC and pitocin restart.   Labor: early labor Fetal Wellbeing:  Category I Pain Control:  Labor support without medications Anticipated MOD:  NSVD  [name] 03/31/2020, 3:48 PM

## 2020-03-31 NOTE — MAU Provider Note (Signed)
History     CSN: [id number]  Arrival date and time: 03/31/20 1517   First Provider Initiated Contact with Patient 03/31/20 0606      Chief Complaint  Patient presents with   Hypertension   [name] is a 39 y.o. [id number] at [redacted]w[redacted]d who receives care at [facility].  She presents today for Hypertension.  Patient states she took her Labetalol prior to bed and woke up around 0130 feeling "kinda queasy." She states she woke up and took her blood pressure which was 182/110.  Patient states she takes her blood pressure manually because she used to be a [employer] fund[employer]. She reports she took a shower and took her labetalol and her blood pressure came down to 160/90 then to 140/90s.  She states she called the nurse line and was told to come for evaluation.  Patient states she had the "same episode" the night before but blood pressure was around 160s/90s. Patient reports her Labetalol (200mg ) was increased to 3x/day last week after an MFM visit.  Unsolicited patient reports no floaters or dizziness, but states she has a headache and "feels a little weird" when her blood pressure gets high. Patient reports headache "fluctuates between a 3 and a 6/10." Patient reports she took tylenol at 0245am, but does not feel it helped. She endorses fetal movement and denies vaginal concerns including leaking, bleeding, or discharge.  She also denies any abdominal pain or cramping.      OB History    Gravida  10   Para  6   Term  5   Preterm  1   AB  3   Living  6     SAB  3   TAB      Ectopic      Multiple  0   Live Births  6           Past Medical History:  Diagnosis Date   Allergic rhinitis 09/06/2015   Anginal pain (HCC)    Anxiety    Deaf, left    Excess, menstruation 09/06/2015   Gestational diabetes 2008    glyburide this pregnancy   History of chicken pox    History of COVID-19 03/11/2020   April 2021   History of gestational diabetes 04/16/2016   Early a1c neg   Hydronephrosis,  right 11/08/2012   Insomnia 09/06/2015   Kidney stones 2013   13 passed   Nephrolithiasis 11/29/2012   Pregnancy induced hypertension    labetolol   Preterm labor    delivery    Past Surgical History:  Procedure Laterality Date   CESAREAN SECTION  2006   WISDOM TOOTH EXTRACTION     x4    Family History  Problem Relation Age of Onset   Schizophrenia Maternal Grandmother    Atrial fibrillation Mother    Depression Mother    Anxiety disorder Mother    Mental illness Father    Heart attack Maternal Grandfather    Atrial fibrillation Paternal Grandfather    Diabetes Paternal Grandfather    Heart attack Paternal Grandfather    Allergies Sister     Social History   Tobacco Use   Smoking status: Never Smoker   Smokeless tobacco: Never Used  Vaping Use   Vaping Use: Never used  Substance Use Topics   Alcohol use: No    Alcohol/week: 0.0 standard drinks   Drug use: No    Allergies:  Allergies  Allergen Reactions   Ivp Dye [Iodinated Diagnostic Agents]  Anaphylaxis   Iodine    Phenergan Fortis [Promethazine] Itching    Itching/pins and needles for hours.    Medications Prior to Admission  Medication Sig Dispense Refill Last Dose   aspirin EC 81 MG tablet Take 1 tablet (81 mg total) by mouth daily. 90 tablet 3 Past Week at Unknown time   labetalol (NORMODYNE) 200 MG tablet Take 1 tablet (200 mg total) by mouth 3 (three) times daily. 30 tablet 3 03/31/2020 at 0245   Multiple Vitamins-Minerals (MULTIVITAMIN WITH MINERALS) tablet Take 1 tablet by mouth daily.   03/30/2020 at Unknown time   Ascorbic Acid (VITA-C PO) Take by mouth. (Patient not taking: Reported on 03/28/2020)      hydrOXYzine (ATARAX/VISTARIL) 25 MG tablet Take 1 tablet (25 mg total) by mouth every 6 (six) hours as needed for itching. (Patient not taking: Reported on 02/27/2020) 30 tablet 2    Multiple Vitamins-Minerals (ZINC PO) Take by mouth. (Patient not taking: Reported on  03/28/2020)      Prenatal Vit-Fe Fumarate-FA (PRENATAL MULTIVITAMIN) TABS tablet Take 1 tablet by mouth daily at 12 noon. (Patient not taking: Reported on 03/28/2020)       Review of Systems  Eyes: Negative for visual disturbance.  Respiratory: Negative for cough and shortness of breath.   Gastrointestinal: Positive for nausea. Negative for abdominal pain and vomiting.  Genitourinary: Positive for pelvic pain. Negative for difficulty urinating, dysuria, vaginal bleeding and vaginal discharge.  Musculoskeletal: Negative for back pain.  Neurological: Positive for dizziness (During elevated bp), light-headedness (During elevated bp) and headaches.   Physical Exam   Blood pressure (!) 144/80, pulse 88, temperature 98.4 F (36.9 C), temperature source Oral, resp. rate 20, height 6\' 1"  (1.854 m), weight (!) 153.5 kg, last menstrual period 07/17/2019, unknown if currently breastfeeding.  Vitals:   03/31/20 0701 03/31/20 0716 03/31/20 0731 03/31/20 0745  BP: (!) 156/90 (!) 158/93 (!) 145/75 (!) 157/84  Pulse: 81 84 83 85  Resp:      Temp:      TempSrc:      Weight:      Height:         Physical Exam  Nursing note and vitals reviewed. Constitutional: She is oriented to person, place, and time.  HENT:  Head: Normocephalic and atraumatic.  Eyes: Conjunctivae are normal.  Cardiovascular: Regular rhythm and normal heart sounds.  Respiratory: Effort normal and breath sounds normal. No respiratory distress.  GI: Normal appearance.  Gravid--fundal height appears LGA, Soft, NT   Musculoskeletal:        General: Normal range of motion.     Right lower leg: Edema present.     Left lower leg: Edema present.  Neurological: She is alert and oriented to person, place, and time.  Skin: Skin is warm and dry.  Psychiatric: Mood, judgment and thought content normal.    Fetal Assessment 135 bpm, Mod Var, -Decels, +Accels Toco: No ctx graphed  MAU Course   Results for orders placed or  performed during the hospital encounter of 03/31/20 (from the past 24 hour(s))  Urinalysis, Routine w reflex microscopic     Status: Abnormal   Collection Time: 03/31/20  5:40 AM  Result Value Ref Range   Color, Urine YELLOW YELLOW   APPearance HAZY (A) CLEAR   Specific Gravity, Urine 1.025 1.005 - 1.030   pH 5.0 5.0 - 8.0   Glucose, UA NEGATIVE NEGATIVE mg/dL   Hgb urine dipstick NEGATIVE NEGATIVE   Bilirubin Urine NEGATIVE NEGATIVE  Ketones, ur 5 (A) NEGATIVE mg/dL   Protein, ur 30 (A) NEGATIVE mg/dL   Nitrite NEGATIVE NEGATIVE   Leukocytes,Ua NEGATIVE NEGATIVE   RBC / HPF 0-5 0 - 5 RBC/hpf   WBC, UA 0-5 0 - 5 WBC/hpf   Bacteria, UA NONE SEEN NONE SEEN   Squamous Epithelial / LPF 6-10 0 - 5   Mucus PRESENT    Hyaline Casts, UA PRESENT   Protein / creatinine ratio, urine     Status: None   Collection Time: 03/31/20  5:40 AM  Result Value Ref Range   Creatinine, Urine 259.65 mg/dL   Total Protein, Urine 22 mg/dL   Protein Creatinine Ratio 0.08 0.00 - 0.15 mg/mg[Cre]  CBC     Status: Abnormal   Collection Time: 03/31/20  6:35 AM  Result Value Ref Range   WBC 7.0 4.0 - 10.5 K/uL   RBC 3.55 (L) 3.87 - 5.11 MIL/uL   Hemoglobin 9.2 (L) 12.0 - 15.0 g/dL   HCT 06.3 (L) 36 - 46 %   MCV 84.2 80.0 - 100.0 fL   MCH 25.9 (L) 26.0 - 34.0 pg   MCHC 30.8 30.0 - 36.0 g/dL   RDW 01.6 01.0 - 93.2 %   Platelets 194 150 - 400 K/uL   nRBC 0.0 0.0 - 0.2 %  Comprehensive metabolic panel     Status: Abnormal   Collection Time: 03/31/20  6:35 AM  Result Value Ref Range   Sodium 136 135 - 145 mmol/L   Potassium 3.9 3.5 - 5.1 mmol/L   Chloride 105 98 - 111 mmol/L   CO2 21 (L) 22 - 32 mmol/L   Glucose, Bld 113 (H) 70 - 99 mg/dL   BUN 11 6 - 20 mg/dL   Creatinine, Ser 3.55 0.44 - 1.00 mg/dL   Calcium 8.7 (L) 8.9 - 10.3 mg/dL   Total Protein 5.2 (L) 6.5 - 8.1 g/dL   Albumin 2.5 (L) 3.5 - 5.0 g/dL   AST 15 15 - 41 U/L   ALT 12 0 - 44 U/L   Alkaline Phosphatase 72 38 - 126 U/L   Total  Bilirubin 0.5 0.3 - 1.2 mg/dL   GFR calc non Af Amer >60 >60 mL/min   GFR calc Af Amer >60 >60 mL/min   Anion gap 10 5 - 15   No results found.  MDM Physical Exam Labs: CBC, CMP, PC Ratio Measure BPQ15 min EFM Pain Management Assessment and Plan  39 year old [id number]  SIUP at 36.6weeks Cat I FT CHTN  -POC reviewed. -Exam performed and findings discussed. -Patient informed that provider will call MD and recommend increasing labetalol. -Patient declines medication for nausea.  -Patient agreeable to medication for headache stating headache fluctuates. -Will give Fioricet despite <6hrs since last tylenol dose. -Dr. Despina Hidden consulted and informed of patient status.  Agrees with plan to increase Labetalol to 400mg  TID. -Patient with scheduled appt on Monday June 14th with Dr. June 16.  -Will await lab results and review once complete.  Gildardo Griffes MSN, CNM 03/31/2020, 6:07 AM   Reassessment (8:01 AM)  -Nurse call reports labs return normal. -Provider reviews labs. -Patient reports headache remains between a 3 and 6. -Dr. 04/02/2020 consulted and advised that patient could be given option for IOL today. -Options discussed with patient who is agreeable.  -Dr. Shawnie Pons informed of patient status. Requests cervical exam.  Nurse notified. -Nurse instructed to release SH orders for anticipated IOL on 6/17. -Labor team notified.  Maryann Conners MSN, CNM Advanced Practice Provider, [facility]

## 2020-03-31 NOTE — Discharge Instructions (Signed)
   Managing Your Hypertension Hypertension is commonly called high blood pressure. This is when the force of your blood pressing against the walls of your arteries is too strong. Arteries are blood vessels that carry blood from your heart throughout your body. Hypertension forces the heart to work harder to pump blood, and may cause the arteries to become narrow or stiff. Having untreated or uncontrolled hypertension can cause heart attack, stroke, kidney disease, and other problems. What are blood pressure readings? A blood pressure reading consists of a higher number over a lower number. Ideally, your blood pressure should be below 120/80. The first ("top") number is called the systolic pressure. It is a measure of the pressure in your arteries as your heart beats. The second ("bottom") number is called the diastolic pressure. It is a measure of the pressure in your arteries as the heart relaxes. What does my blood pressure reading mean? Blood pressure is classified into four stages. Based on your blood pressure reading, your health care provider may use the following stages to determine what type of treatment you need, if any. Systolic pressure and diastolic pressure are measured in a unit called mm Hg. Normal  Systolic pressure: below 120.  Diastolic pressure: below 80. Elevated  Systolic pressure: 120-129.  Diastolic pressure: below 80. Hypertension stage 1  Systolic pressure: 130-139.  Diastolic pressure: 80-89. Hypertension stage 2  Systolic pressure: 140 or above.  Diastolic pressure: 90 or above. What health risks are associated with hypertension? Managing your hypertension is an important responsibility. Uncontrolled hypertension can lead to:  A heart attack.  A stroke.  A weakened blood vessel (aneurysm).  Heart failure.  Kidney damage.  Eye damage.  Metabolic syndrome.  Memory and concentration problems. What changes can I make to manage my  hypertension? Hypertension can be managed by making lifestyle changes and possibly by taking medicines. Your health care provider will help you make a plan to bring your blood pressure within a normal range. Eating and drinking   Eat a diet that is high in fiber and potassium, and low in salt (sodium), added sugar, and fat. An example eating plan is called the DASH (Dietary Approaches to Stop Hypertension) diet. To eat this way: ? Eat plenty of fresh fruits and vegetables. Try to fill half of your plate at each meal with fruits and vegetables. ? Eat whole grains, such as whole wheat pasta, brown rice, or whole grain bread. Fill about one quarter of your plate with whole grains. ? Eat low-fat diary products. ? Avoid fatty cuts of meat, processed or cured meats, and poultry with skin. Fill about one quarter of your plate with lean proteins such as fish, chicken without skin, beans, eggs, and tofu. ? Avoid premade and processed foods. These tend to be higher in sodium, added sugar, and fat.  Reduce your daily sodium intake. Most people with hypertension should eat less than 1,500 mg of sodium a day.  Limit alcohol intake to no more than 1 drink a day for nonpregnant women and 2 drinks a day for men. One drink equals 12 oz of beer, 5 oz of wine, or 1 oz of hard liquor. Lifestyle  Work with your health care provider to maintain a healthy body weight, or to lose weight. Ask what an ideal weight is for you.  Get at least 30 minutes of exercise that causes your heart to beat faster (aerobic exercise) most days of the week. Activities may include walking, swimming, or biking.    Include exercise to strengthen your muscles (resistance exercise), such as weight lifting, as part of your weekly exercise routine. Try to do these types of exercises for 30 minutes at least 3 days a week.  Do not use any products that contain nicotine or tobacco, such as cigarettes and e-cigarettes. If you need help quitting,  ask your health care provider.  Control any long-term (chronic) conditions you have, such as high cholesterol or diabetes. Monitoring  Monitor your blood pressure at home as told by your health care provider. Your personal target blood pressure may vary depending on your medical conditions, your age, and other factors.  Have your blood pressure checked regularly, as often as told by your health care provider. Working with your health care provider  Review all the medicines you take with your health care provider because there may be side effects or interactions.  Talk with your health care provider about your diet, exercise habits, and other lifestyle factors that may be contributing to hypertension.  Visit your health care provider regularly. Your health care provider can help you create and adjust your plan for managing hypertension. Will I need medicine to control my blood pressure? Your health care provider may prescribe medicine if lifestyle changes are not enough to get your blood pressure under control, and if:  Your systolic blood pressure is 130 or higher.  Your diastolic blood pressure is 80 or higher. Take medicines only as told by your health care provider. Follow the directions carefully. Blood pressure medicines must be taken as prescribed. The medicine does not work as well when you skip doses. Skipping doses also puts you at risk for problems. Contact a health care provider if:  You think you are having a reaction to medicines you have taken.  You have repeated (recurrent) headaches.  You feel dizzy.  You have swelling in your ankles.  You have trouble with your vision. Get help right away if:  You develop a severe headache or confusion.  You have unusual weakness or numbness, or you feel faint.  You have severe pain in your chest or abdomen.  You vomit repeatedly.  You have trouble breathing. Summary  Hypertension is when the force of blood pumping  through your arteries is too strong. If this condition is not controlled, it may put you at risk for serious complications.  Your personal target blood pressure may vary depending on your medical conditions, your age, and other factors. For most people, a normal blood pressure is less than 120/80.  Hypertension is managed by lifestyle changes, medicines, or both. Lifestyle changes include weight loss, eating a healthy, low-sodium diet, exercising more, and limiting alcohol. This information is not intended to replace advice given to you by your health care provider. Make sure you discuss any questions you have with your health care provider. Document Revised: 01/27/2019 Document Reviewed: 09/02/2016 Elsevier Patient Education  2020 [employer]  

## 2020-03-31 NOTE — H&P (Addendum)
OBSTETRIC ADMISSION HISTORY AND PHYSICAL  [name] is a 39 y.o. female [phone] with IUP at [redacted]w[redacted]d presenting for IOL 2/2 worsening blood pressures in the context of chronic hypertension. She reports +FMs. No LOF, VB, blurry vision, headaches, peripheral edema, or RUQ pain. She plans on breastfeeding. She declines birth control.  Dating: By LMP, c/w 11 week [place] --->  Estimated Date of Delivery: 04/22/20  Sono:   @[redacted]w[redacted]d , mostly normal anatomy, variable presentation, 567g, 57%ile, EFW 1#4 -fetal right renal solitary cyst -cord vessels not well visualized -Heart/RVOT/LVOT not well visualized  Prenatal History/Complications: Chronic hypertension on medication GERD Left Renal Cyst Anxiety Chronic Fatigue AMA History of Cesarean delivery History of VBAC x5 Vitamin D insufficiency GDM H/o ASC-H on Pap, HPV neg GBS bacteriuria  Past Medical History: Past Medical History:  Diagnosis Date  . Allergic rhinitis 09/06/2015  . Anginal pain ([place])   . Anxiety   . Deaf, left   . Excess, menstruation 09/06/2015  . Gestational diabetes 2008    glyburide this pregnancy  . History of chicken pox   . History of COVID-19 03/11/2020   April 2021  . History of gestational diabetes 04/16/2016   Early a1c neg  . Hydronephrosis, right 11/08/2012  . Insomnia 09/06/2015  . Kidney stones 2013   13 passed  . Nephrolithiasis 11/29/2012  . Pregnancy induced hypertension    labetolol  . Preterm labor    delivery    Past Surgical History: Past Surgical History:  Procedure Laterality Date  . CESAREAN SECTION  2006  . WISDOM TOOTH EXTRACTION     x4    Obstetrical History: OB History    Gravida  10   Para  6   Term  5   Preterm  1   AB  3   Living  6     SAB  3   TAB      Ectopic      Multiple  0   Live Births  6           Social History: Social History   Socioeconomic History  . Marital status: Married    Spouse name: Not on file  . Number of children: Not on  file  . Years of education: Not on file  . Highest education level: Not on file  Occupational History  . Occupation: Stay at home    [place] [employer]  Tobacco Use  . Smoking status: Never Smoker  . Smokeless tobacco: Never Used  Vaping Use  . Vaping Use: Never used  Substance and Sexual Activity  . Alcohol use: No    Alcohol/week: 0.0 standard drinks  . Drug use: No  . Sexual activity: Yes    Partners: Male    Birth control/protection: None  Other Topics Concern  . Not on file  Social History Narrative  . Not on file   Social Determinants of Health   Financial Resource Strain:   . Difficulty of Paying Living Expenses:   Food Insecurity:   . Worried About Charity fundraiser in the Last Year:   . Arboriculturist in the Last Year:   Transportation Needs:   . Film/video editor (Medical):   Marland Kitchen Lack of Transportation (Non-Medical):   Physical Activity:   . Days of Exercise per Week:   . Minutes of Exercise per Session:   Stress:   . Feeling of Stress :   Social Connections:   . Frequency of Communication  with Friends and Family:   . Frequency of Social Gatherings with Friends and Family:   . Attends Religious Services:   . Active Member of Clubs or Organizations:   . Attends Banker Meetings:   Marland Kitchen Marital Status:     Family History: Family History  Problem Relation Age of Onset  . Schizophrenia Maternal Grandmother   . Atrial fibrillation Mother   . Depression Mother   . Anxiety disorder Mother   . Mental illness Father   . Heart attack Maternal Grandfather   . Atrial fibrillation Paternal Grandfather   . Diabetes Paternal Grandfather   . Heart attack Paternal Grandfather   . Allergies Sister     Allergies: Allergies  Allergen Reactions  . Ivp Dye [Iodinated Diagnostic Agents] Anaphylaxis  . Iodine   . Phenergan Fortis [Promethazine] Itching    Itching/pins and needles for hours.    Medications Prior to Admission   Medication Sig Dispense Refill Last Dose  . aspirin EC 81 MG tablet Take 1 tablet (81 mg total) by mouth daily. 90 tablet 3 Past Week at Unknown time  . Multiple Vitamins-Minerals (MULTIVITAMIN WITH MINERALS) tablet Take 1 tablet by mouth daily.   03/30/2020 at Unknown time  . [DISCONTINUED] labetalol (NORMODYNE) 200 MG tablet Take 1 tablet (200 mg total) by mouth 3 (three) times daily. 30 tablet 3 03/31/2020 at 0245  . Ascorbic Acid (VITA-C PO) Take by mouth. (Patient not taking: Reported on 03/28/2020)     . hydrOXYzine (ATARAX/VISTARIL) 25 MG tablet Take 1 tablet (25 mg total) by mouth every 6 (six) hours as needed for itching. (Patient not taking: Reported on 02/27/2020) 30 tablet 2   . Multiple Vitamins-Minerals (ZINC PO) Take by mouth. (Patient not taking: Reported on 03/28/2020)     . Prenatal Vit-Fe Fumarate-FA (PRENATAL MULTIVITAMIN) TABS tablet Take 1 tablet by mouth daily at 12 noon. (Patient not taking: Reported on 03/28/2020)        Review of Systems:  All systems reviewed and negative except as stated in HPI  PE: Blood pressure (!) 151/77, pulse 92, temperature 98.8 F (37.1 C), temperature source Oral, resp. rate 20, height 6\' 1"  (1.854 m), weight (!) 153.5 kg, last menstrual period 07/17/2019, unknown if currently breastfeeding. General appearance: alert and cooperative Lungs: regular rate and effort Heart: regular rate  Abdomen: soft, non-tender Extremities: Homans sign is negative, no sign of DVT Presentation: cephalic EFM: 140 bpm, moderate variability, 15x15 accels, no decels Toco: no CTX Dilation: 1 Effacement (%): 70 Station: -3 Exam by:: Dr. [email or web address]  Prenatal labs: ABO, Rh: --/--/PENDING (06/13 0845) Antibody: PENDING (06/13 0845) Rubella: 1.31 (12/22 1153) RPR: Non Reactive (03/29 1534)  HBsAg: Negative (12/22 1153)  HIV: Non Reactive (03/29 1534)  GBS:   + in urine 2 hr GTT not done, but has a history  Prenatal Transfer Tool  Maternal Diabetes: Yes:   Diabetes Type:  Diet controlled Genetic Screening: Declined Maternal Ultrasounds/Referrals: Other: Right renal cyst, Heart not well visualized Fetal Ultrasounds or other Referrals:  Referred to Materal Fetal Medicine  Maternal Substance Abuse:  No Significant Maternal Medications:  None Significant Maternal Lab Results: Group B Strep positive  Results for orders placed or performed during the hospital encounter of 03/31/20 (from the past 24 hour(s))  Urinalysis, Routine w reflex microscopic   Collection Time: 03/31/20  5:40 AM  Result Value Ref Range   Color, Urine YELLOW YELLOW   APPearance HAZY (A) CLEAR   Specific Gravity, Urine 1.025 1.005 -  1.030   pH 5.0 5.0 - 8.0   Glucose, UA NEGATIVE NEGATIVE mg/dL   Hgb urine dipstick NEGATIVE NEGATIVE   Bilirubin Urine NEGATIVE NEGATIVE   Ketones, ur 5 (A) NEGATIVE mg/dL   Protein, ur 30 (A) NEGATIVE mg/dL   Nitrite NEGATIVE NEGATIVE   Leukocytes,Ua NEGATIVE NEGATIVE   RBC / HPF 0-5 0 - 5 RBC/hpf   WBC, UA 0-5 0 - 5 WBC/hpf   Bacteria, UA NONE SEEN NONE SEEN   Squamous Epithelial / LPF 6-10 0 - 5   Mucus PRESENT    Hyaline Casts, UA PRESENT   Protein / creatinine ratio, urine   Collection Time: 03/31/20  5:40 AM  Result Value Ref Range   Creatinine, Urine 259.65 mg/dL   Total Protein, Urine 22 mg/dL   Protein Creatinine Ratio 0.08 0.00 - 0.15 mg/mg[Cre]  CBC   Collection Time: 03/31/20  6:35 AM  Result Value Ref Range   WBC 7.0 4.0 - 10.5 K/uL   RBC 3.55 (L) 3.87 - 5.11 MIL/uL   Hemoglobin 9.2 (L) 12.0 - 15.0 g/dL   HCT 46.9 (L) 36 - 46 %   MCV 84.2 80.0 - 100.0 fL   MCH 25.9 (L) 26.0 - 34.0 pg   MCHC 30.8 30.0 - 36.0 g/dL   RDW 62.9 52.8 - 41.3 %   Platelets 194 150 - 400 K/uL   nRBC 0.0 0.0 - 0.2 %  Comprehensive metabolic panel   Collection Time: 03/31/20  6:35 AM  Result Value Ref Range   Sodium 136 135 - 145 mmol/L   Potassium 3.9 3.5 - 5.1 mmol/L   Chloride 105 98 - 111 mmol/L   CO2 21 (L) 22 - 32 mmol/L    Glucose, Bld 113 (H) 70 - 99 mg/dL   BUN 11 6 - 20 mg/dL   Creatinine, Ser 2.44 0.44 - 1.00 mg/dL   Calcium 8.7 (L) 8.9 - 10.3 mg/dL   Total Protein 5.2 (L) 6.5 - 8.1 g/dL   Albumin 2.5 (L) 3.5 - 5.0 g/dL   AST 15 15 - 41 U/L   ALT 12 0 - 44 U/L   Alkaline Phosphatase 72 38 - 126 U/L   Total Bilirubin 0.5 0.3 - 1.2 mg/dL   GFR calc non Af Amer >60 >60 mL/min   GFR calc Af Amer >60 >60 mL/min   Anion gap 10 5 - 15  Type and screen   Collection Time: 03/31/20  8:45 AM  Result Value Ref Range   ABO/RH(D) PENDING    Antibody Screen PENDING    Sample Expiration      04/03/2020,2359 Performed at [facility], [address] [place], [place] [address]     Patient Active Problem List   Diagnosis Date Noted  . Gestational hypertension 03/31/2020  . Fetal right renal solitary cyst 03/17/2020  . BMI 40.0-44.9, adult (HCC) 12/19/2019  . Group B streptococcal bacteriuria 10/16/2019  . Chest pain 04/18/2019  . Depression, major, single episode 09/27/2018  . Atypical squamous cells cannot exclude high grade squamous intraepithelial lesion on cytologic smear of cervix (ASC-H) 07/10/2016  . Supervision of high risk pregnancy, antepartum, first trimester 04/16/2016  . History of gestational diabetes 04/16/2016  . Episodic paroxysmal anxiety disorder 09/06/2015  . Obesity in pregnancy, antepartum   . Gestational diabetes mellitus (GDM) in third trimester 12/19/2014  . Vitamin D insufficiency 11/30/2014  . Chronic fatigue 11/27/2014  . AMA (advanced maternal age) multigravida 35+ 11/27/2014  . History of VBAC  11/27/2014  . Chronic hypertension affecting pregnancy 04/28/2013  . Anxiety with flying   . GERD 12/23/2007  . Acquired cyst of kidney 12/23/2007    Assessment: UMAIZA MATUSIK is a 39 y.o. [id number] at [redacted]w[redacted]d here for IOL 2/2 worsening chronic hypertension with almost severe range pressures  1. Labor: FB placed, will start Pitocin, hold at 6 until FB out. Bishop score  5. 2. FWB: Cat I 3. Pain: per patient request 4. GBS: positive in urine, PCN 5. TOLAC: s/p VBAC x5 6. Grand multiparity: TXA prior to delivery, discussed PPH risk 7. GDM: CBG q4h, q2h when active 8. CHTN with worsening BP:  P:Cr 0.08, but would most likely have severe range BP without labetalol. Will start Mag for Severe Pre-E by BP   Plan: Admit to L&D  Risks and benefits of induction were reviewed, including failure of method, prolonged labor, need for further intervention, risk of cesarean.  Patient and family seem to understand these risks and wish to proceed. Options of foley bulb, AROM, and pitocin reviewed, with use of each discussed.  Reviewed risks/benefits of TOLAC versus RCS in detail. Patient counseled regarding potential vaginal delivery, chance of success, future implications, possible uterine rupture and need for urgent/emergent repeat cesarean. Counseled regarding potential need for repeat c-section for reasons unrelated to first c-section. Counseled regarding scheduled repeat cesarean including risks of bleeding, infection, damage to surrounding tissue, abnormal placentation, implications for future pregnancies. All questions answered.  Patient desires TOLAC, consent signed 03/31/2020.  Pier Bosher L Ryann Pauli, DO  03/31/2020, 9:31 AM

## 2020-04-01 ENCOUNTER — Inpatient Hospital Stay (HOSPITAL_COMMUNITY): Payer: Medicaid Other | Admitting: Anesthesiology

## 2020-04-01 ENCOUNTER — Encounter: Payer: Medicaid Other | Admitting: Family Medicine

## 2020-04-01 ENCOUNTER — Encounter (HOSPITAL_COMMUNITY): Payer: Self-pay | Admitting: Family Medicine

## 2020-04-01 DIAGNOSIS — O99824 Streptococcus B carrier state complicating childbirth: Secondary | ICD-10-CM

## 2020-04-01 DIAGNOSIS — Z3A37 37 weeks gestation of pregnancy: Secondary | ICD-10-CM

## 2020-04-01 DIAGNOSIS — O34219 Maternal care for unspecified type scar from previous cesarean delivery: Secondary | ICD-10-CM

## 2020-04-01 DIAGNOSIS — O1092 Unspecified pre-existing hypertension complicating childbirth: Secondary | ICD-10-CM

## 2020-04-01 DIAGNOSIS — Z98891 History of uterine scar from previous surgery: Secondary | ICD-10-CM

## 2020-04-01 LAB — CBC
HCT: 27.4 % — ABNORMAL LOW (ref 36.0–46.0)
HCT: 29 % — ABNORMAL LOW (ref 36.0–46.0)
Hemoglobin: 8.6 g/dL — ABNORMAL LOW (ref 12.0–15.0)
Hemoglobin: 9 g/dL — ABNORMAL LOW (ref 12.0–15.0)
MCH: 25.8 pg — ABNORMAL LOW (ref 26.0–34.0)
MCH: 26.1 pg (ref 26.0–34.0)
MCHC: 31 g/dL (ref 30.0–36.0)
MCHC: 31.4 g/dL (ref 30.0–36.0)
MCV: 83.1 fL (ref 80.0–100.0)
MCV: 83.3 fL (ref 80.0–100.0)
Platelets: 203 10*3/uL (ref 150–400)
Platelets: 203 10*3/uL (ref 150–400)
RBC: 3.29 MIL/uL — ABNORMAL LOW (ref 3.87–5.11)
RBC: 3.49 MIL/uL — ABNORMAL LOW (ref 3.87–5.11)
RDW: 14.3 % (ref 11.5–15.5)
RDW: 14.5 % (ref 11.5–15.5)
WBC: 7.6 10*3/uL (ref 4.0–10.5)
WBC: 7.7 10*3/uL (ref 4.0–10.5)
nRBC: 0 % (ref 0.0–0.2)
nRBC: 0 % (ref 0.0–0.2)

## 2020-04-01 LAB — RPR: RPR Ser Ql: NONREACTIVE

## 2020-04-01 MED ORDER — BUPIVACAINE HCL (PF) 0.25 % IJ SOLN
INTRAMUSCULAR | Status: DC | PRN
  Administered 2020-04-01: 8 mL via EPIDURAL

## 2020-04-01 MED ORDER — ONDANSETRON HCL 4 MG PO TABS: 4.0000 mg | ORAL_TABLET | ORAL | Status: DC | PRN

## 2020-04-01 MED ORDER — LIDOCAINE HCL (PF) 1 % IJ SOLN
INTRAMUSCULAR | Status: DC | PRN
  Administered 2020-04-01: 10 mL via EPIDURAL
  Administered 2020-04-01: 2 mL via EPIDURAL

## 2020-04-01 MED ORDER — SIMETHICONE 80 MG PO CHEW: 80.0000 mg | CHEWABLE_TABLET | ORAL | Status: DC | PRN

## 2020-04-01 MED ORDER — DIPHENHYDRAMINE HCL 25 MG PO CAPS: 25.0000 mg | ORAL_CAPSULE | Freq: Four times a day (QID) | ORAL | Status: DC | PRN

## 2020-04-01 MED ORDER — IBUPROFEN 800 MG PO TABS
800.0000 mg | ORAL_TABLET | Freq: Three times a day (TID) | ORAL | Status: DC | PRN
  Administered 2020-04-02 (×2): 800 mg via ORAL
  Filled 2020-04-01 (×2): qty 1

## 2020-04-01 MED ORDER — TETANUS-DIPHTH-ACELL PERTUSSIS 5-2.5-18.5 LF-MCG/0.5 IM SUSP: 0.5000 mL | Freq: Once | INTRAMUSCULAR | Status: DC

## 2020-04-01 MED ORDER — IBUPROFEN 600 MG PO TABS
600.0000 mg | ORAL_TABLET | Freq: Three times a day (TID) | ORAL | Status: DC | PRN
  Administered 2020-04-01 (×2): 600 mg via ORAL
  Filled 2020-04-01 (×2): qty 1

## 2020-04-01 MED ORDER — MEASLES, MUMPS & RUBELLA VAC IJ SOLR: 0.5000 mL | Freq: Once | INTRAMUSCULAR | Status: DC

## 2020-04-01 MED ORDER — ACETAMINOPHEN 325 MG PO TABS: 650.0000 mg | ORAL_TABLET | Freq: Four times a day (QID) | ORAL | Status: DC | PRN

## 2020-04-01 MED ORDER — SODIUM CHLORIDE 0.9 % IV SOLN
510.0000 mg | Freq: Once | INTRAVENOUS | Status: AC
  Administered 2020-04-01: 510 mg via INTRAVENOUS
  Filled 2020-04-01: qty 17

## 2020-04-01 MED ORDER — SODIUM CHLORIDE (PF) 0.9 % IJ SOLN
INTRAMUSCULAR | Status: DC | PRN
  Administered 2020-04-01: 12 mL/h via EPIDURAL

## 2020-04-01 MED ORDER — METHYLERGONOVINE MALEATE 0.2 MG/ML IJ SOLN
INTRAMUSCULAR | Status: AC
  Administered 2020-04-01: 0.2 mg
  Filled 2020-04-01: qty 1

## 2020-04-01 MED ORDER — FENTANYL CITRATE (PF) 100 MCG/2ML IJ SOLN
INTRAMUSCULAR | Status: DC | PRN
  Administered 2020-04-01: 100 ug via EPIDURAL

## 2020-04-01 MED ORDER — ONDANSETRON HCL 4 MG/2ML IJ SOLN: 4.0000 mg | INTRAMUSCULAR | Status: DC | PRN

## 2020-04-01 MED ORDER — DIBUCAINE (PERIANAL) 1 % EX OINT: 1.0000 "application " | TOPICAL_OINTMENT | CUTANEOUS | Status: DC | PRN

## 2020-04-01 MED ORDER — MISOPROSTOL 200 MCG PO TABS
ORAL_TABLET | ORAL | Status: AC
  Filled 2020-04-01: qty 4

## 2020-04-01 MED ORDER — PRENATAL MULTIVITAMIN CH
1.0000 | ORAL_TABLET | Freq: Every day | ORAL | Status: DC
  Administered 2020-04-01 – 2020-04-02 (×2): 1 via ORAL
  Filled 2020-04-01 (×2): qty 1

## 2020-04-01 MED ORDER — BENZOCAINE-MENTHOL 20-0.5 % EX AERO: 1.0000 "application " | INHALATION_SPRAY | CUTANEOUS | Status: DC | PRN

## 2020-04-01 MED ORDER — WITCH HAZEL-GLYCERIN EX PADS: 1.0000 "application " | MEDICATED_PAD | CUTANEOUS | Status: DC | PRN

## 2020-04-01 MED ORDER — FENTANYL CITRATE (PF) 100 MCG/2ML IJ SOLN
INTRAMUSCULAR | Status: AC
  Filled 2020-04-01: qty 2

## 2020-04-01 MED ORDER — COCONUT OIL OIL: 1.0000 "application " | TOPICAL_OIL | Status: DC | PRN

## 2020-04-01 MED ORDER — SENNOSIDES-DOCUSATE SODIUM 8.6-50 MG PO TABS
2.0000 | ORAL_TABLET | ORAL | Status: DC
  Filled 2020-04-01: qty 2

## 2020-04-01 MED ORDER — MISOPROSTOL 200 MCG PO TABS
800.0000 ug | ORAL_TABLET | Freq: Once | ORAL | Status: AC
  Administered 2020-04-01: 800 ug via RECTAL

## 2020-04-01 MED ORDER — AMLODIPINE BESYLATE 5 MG PO TABS
10.0000 mg | ORAL_TABLET | Freq: Every day | ORAL | Status: DC
  Administered 2020-04-01 – 2020-04-02 (×2): 10 mg via ORAL
  Filled 2020-04-01 (×2): qty 2

## 2020-04-01 MED ORDER — OXYCODONE HCL 5 MG PO TABS
5.0000 mg | ORAL_TABLET | ORAL | Status: DC | PRN
  Administered 2020-04-01 – 2020-04-02 (×4): 5 mg via ORAL
  Filled 2020-04-01 (×4): qty 1

## 2020-04-01 NOTE — Discharge Summary (Addendum)
Postpartum Discharge Summary     Patient Name: [name] DOB: Jul 05, 1981 MRN: [id number]  Date of admission: 03/31/2020 Delivery date:04/01/2020  Delivering provider: Chauncey Mann  Date of discharge: 04/02/2020  Admitting diagnosis: Gestational hypertension [O13.9] Intrauterine pregnancy: [redacted]w[redacted]d    Secondary diagnosis:  Principal Problem:   Chronic hypertension affecting pregnancy Active Problems:   AMA (advanced maternal age) multigravida 40+   History of VBAC   Gestational diabetes mellitus (GDM) in third trimester   Obesity in pregnancy, antepartum   History of gestational diabetes   Depression, major, single episode   Group B streptococcal bacteriuria   History of cesarean section  Additional problems: None    Discharge diagnosis: Term Pregnancy Delivered, VBAC, CHTN and GDM A1                                              Post partum procedures: None Augmentation: AROM, Pitocin and IP Foley Complications: None  Hospital course: Induction of Labor With Vaginal Delivery   39y.o. yo [id number] admitted to the hospital 03/31/2020 for induction of labor.  Indication for induction:  worsening cHTN .  Patient had an uncomplicated labor course as follows: Initial SVE: 1/50/-3. Patient received Foley balloon, Pitocin and AROM. Received epidural. She then progressed to complete.  Membrane Rupture Time/Date: 4:55 PM ,03/31/2020   Delivery Method:Vaginal, Spontaneous  Episiotomy: None  Lacerations:  None  Details of delivery can be found in separate delivery note. Patient declined fasting CBG. Norvasc 10 mg initiated; BP's monitored and well-controlled. Patient had a routine postpartum course. Patient is discharged home 04/02/20.  She met with soEducation officer, museumnd was interested in starting medications for anxiety/depression.  She had previously tried Celexa and Lexapro, both with some benefit, but not tolerable due to side effects.  She most recently tried Wellbutrin  and had good results with this, but felt she needed another medication to augment.  After long discussion, she wanted to start both Wellbutrin and Zoloft.  Discussed proper use and side effects.       Newborn Data: Birth date:04/01/2020  Birth time:3:41 AM  Gender:Female  Living status:Living  Apgars:9 ,9  Weight:3456 g   Magnesium Sulfate received: No BMZ received: No Rhophylac:No MMR:N/A T-DaP: declined Flu: No Transfusion:No  Physical exam  Vitals:   04/01/20 0300 04/01/20 0326 04/01/20 0334 04/01/20 0355  BP: (!) 162/99 (!) 164/91 (!) 166/81 (!) 149/76  Pulse: (!) 102 (!) 106 (!) 101 95  Resp: 18     Temp:      TempSrc:      Weight:      Height:       General: alert, cooperative and no distress Lochia: appropriate Uterine Fundus: firm Incision: N/A DVT Evaluation: No evidence of DVT seen on physical exam. Labs: Lab Results  Component Value Date   WBC 7.6 03/31/2020   HGB 9.0 (L) 03/31/2020   HCT 29.0 (L) 03/31/2020   MCV 83.1 03/31/2020   PLT 203 03/31/2020   CMP Latest Ref Rng & Units 03/31/2020  Glucose 70 - 99 mg/dL 113(H)  BUN 6 - 20 mg/dL 11  Creatinine 0.44 - 1.00 mg/dL 0.74  Sodium 135 - 145 mmol/L 136  Potassium 3.5 - 5.1 mmol/L 3.9  Chloride 98 - 111 mmol/L 105  CO2 22 - 32 mmol/L 21(L)  Calcium  8.9 - 10.3 mg/dL 8.7(L)  Total Protein 6.5 - 8.1 g/dL 5.2(L)  Total Bilirubin 0.3 - 1.2 mg/dL 0.5  Alkaline Phos 38 - 126 U/L 72  AST 15 - 41 U/L 15  ALT 0 - 44 U/L 12   Edinburgh Score: Edinburgh Postnatal Depression Scale Screening Tool 04/13/2017  I have been able to laugh and see the funny side of things. 0  I have looked forward with enjoyment to things. 0  I have blamed myself unnecessarily when things went wrong. 3  I have been anxious or worried for no good reason. 3  I have felt scared or panicky for no good reason. 3  Things have been getting on top of me. 2  I have been so unhappy that I have had difficulty sleeping. 0  I have felt sad  or miserable. 1  I have been so unhappy that I have been crying. 1  The thought of harming myself has occurred to me. 0  Edinburgh Postnatal Depression Scale Total 13     After visit meds:  Allergies as of 04/02/2020       Reactions   Ivp Dye [iodinated Diagnostic Agents] Anaphylaxis   Iodine    Phenergan Fortis [promethazine] Itching   Itching/pins and needles for hours.        Medication List     STOP taking these medications    hydrOXYzine 25 MG tablet Commonly known as: ATARAX/VISTARIL   labetalol 200 MG tablet Commonly known as: NORMODYNE   VITA-C PO   ZINC PO       TAKE these medications    acetaminophen 325 MG tablet Commonly known as: Tylenol Take 2 tablets (650 mg total) by mouth every 6 (six) hours as needed (for pain scale < 4).   amLODipine 10 MG tablet Commonly known as: NORVASC Take 1 tablet (10 mg total) by mouth daily.   aspirin EC 81 MG tablet Take 1 tablet (81 mg total) by mouth daily.   buPROPion 150 MG 24 hr tablet Commonly known as: Wellbutrin XL Take 1 tablet (150 mg total) by mouth every morning.   ibuprofen 800 MG tablet Commonly known as: ADVIL Take 1 tablet (800 mg total) by mouth every 8 (eight) hours as needed for mild pain.   multivitamin with minerals tablet Take 1 tablet by mouth daily.   prenatal multivitamin Tabs tablet Take 1 tablet by mouth daily at 12 noon.   sertraline 25 MG tablet Commonly known as: Zoloft Take 1 tablet (25 mg total) by mouth daily.         Discharge home in stable condition Infant Feeding: Breast Infant Disposition:home with mother Discharge instruction: per After Visit Summary and Postpartum booklet. Activity: Advance as tolerated. Pelvic rest for 6 weeks.  Diet: routine diet Future Appointments: Future Appointments  Date Time Provider [place]  04/01/2020  4:15 PM Donnamae Jude, MD [facility]  04/11/2020 10:30 AM Aletha Halim, MD [facility]    Started on Wellbutrin XL 150 mg daily and Zoloft 25 mg daily and will have a mood check in 2 weeks.  Follow up Visit:   Please schedule this patient for a In person postpartum visit in 4 weeks with the following provider: Any provider. Additional Postpartum F/U:Postpartum Depression checkup, 2 hour GTT and BP check 1 week (mood check in 2 weeks) High risk pregnancy complicated by: GDM and HTN Delivery mode:  Vaginal, Spontaneous  Anticipated Birth Control:   declines   EMILY M GREEN,  MD PGY-2 Resident Family Medicine 04/02/2020, 3:30 PM  I personally saw and evaluated the patient, performing the key elements of the service. I developed and verified the management plan that is described in the resident's/student's note, and I agree with the content with my edits above. VSS, HRR&R, Resp unlabored, Legs neg.  Nigel Berthold, CNM 04/04/2020 10:09 AM

## 2020-04-01 NOTE — Anesthesia Postprocedure Evaluation (Signed)
Anesthesia Post Note  Patient: [name]  Procedure(s) Performed: AN AD HOC LABOR EPIDURAL     Patient location during evaluation: Mother Baby Anesthesia Type: Epidural Level of consciousness: awake Pain management: satisfactory to patient Vital Signs Assessment: post-procedure vital signs reviewed and stable Respiratory status: spontaneous breathing Cardiovascular status: stable Anesthetic complications: no   No complications documented.  Last Vitals:  Vitals:   04/01/20 0738 04/01/20 0739  BP:  (!) 132/91  Pulse: 91   Resp: 19   Temp: 36.9 C   SpO2:      Last Pain:  Vitals:   04/01/20 0739  TempSrc:   PainSc: 4    Pain Goal: Patients Stated Pain Goal: 0 (03/31/20 0719)                 Cephus Shelling

## 2020-04-01 NOTE — Progress Notes (Signed)
Called to room by RN for continued trickle with clots with fundal rubs. Uterine sweep with evacuation of a large amount of clots. Fundus firm with boggy lower uterine segment. Cytotec given at delivery and Methergine given now (BP's mild range). Vitals stable and patient asymptomatic. Will give IV iron and check CBC at 1800.   Jerilynn Birkenhead, MD [facility] Family Medicine Fellow, [facility] for [employer], [facility]

## 2020-04-01 NOTE — Progress Notes (Signed)
[name] was referred for history of depression/anxiety. * Referral screened out by Clinical Social Worker because none of the following criteria appear to apply: ~ History of anxiety/depression during this pregnancy, or of post-partum depression following prior delivery. ~ Diagnosis of anxiety and/or depression within last 3 years. Per further chart review, it appears that [name] was diagnosed with anxiety in 2014. Per [facility] records, [name] anxiety is only around flying.  OR * [name]'s symptoms currently being treated with medication and/or therapy.   Please contact the Clinical Social Worker if needs arise, by [name] request, or if [name] scores greater than 9/yes to question 10 on Edinburgh Postpartum Depression Screen.     [name], MSW, LCSW [facility] [phone]   

## 2020-04-01 NOTE — Anesthesia Preprocedure Evaluation (Signed)
Anesthesia Evaluation  Patient identified by MRN, date of birth, ID band Patient awake    Reviewed: Allergy & Precautions, H&P , Patient's Chart, lab work & pertinent test results, reviewed documented beta blocker date and time   Airway Mallampati: II  TM Distance: >3 FB Neck ROM: full    Dental  (+) Teeth Intact   Pulmonary neg pulmonary ROS,    breath sounds clear to auscultation       Cardiovascular hypertension, Pt. on medications and Pt. on home beta blockers  Rhythm:regular Rate:Normal     Neuro/Psych PSYCHIATRIC DISORDERS Anxiety Depression negative neurological ROS     GI/Hepatic Neg liver ROS, GERD  ,  Endo/Other  diabetes, GestationalMorbid obesityBMI 45  Renal/GU negative Renal ROS  negative genitourinary   Musculoskeletal negative musculoskeletal ROS (+)   Abdominal (+) + obese,   Peds  Hematology  (+) Blood dyscrasia, anemia , hct 29, plt 203   Anesthesia Other Findings       Reproductive/Obstetrics (+) Pregnancy                             Anesthesia Physical  Anesthesia Plan  ASA: III and emergent  Anesthesia Plan: Epidural   Post-op Pain Management:    Induction:   PONV Risk Score and Plan: 2  Airway Management Planned: Natural Airway  Additional Equipment: None  Intra-op Plan:   Post-operative Plan:   Informed Consent: I have reviewed the patients History and Physical, chart, labs and discussed the procedure including the risks, benefits and alternatives for the proposed anesthesia with the patient or authorized representative who has indicated his/her understanding and acceptance.     Dental Advisory Given  Plan Discussed with:   Anesthesia Plan Comments: (Per patient, epidural did not work completely in the past during one of her labors. Other labors, pt had successful epidurals. Reviewed chart for past 3 epidurals. Will proceed with DPE to  confirm loss of resistance and ensure better spread. )        Anesthesia Quick Evaluation

## 2020-04-01 NOTE — Anesthesia Procedure Notes (Signed)
Epidural Patient location during procedure: OB Start time: 04/01/2020 12:35 AM End time: 04/01/2020 12:44 AM  Staffing Anesthesiologist: Lannie Fields, DO Performed: anesthesiologist   Preanesthetic Checklist Completed: patient identified, IV checked, risks and benefits discussed, monitors and equipment checked, pre-op evaluation and timeout performed  Epidural Patient position: sitting Prep: DuraPrep and site prepped and draped Patient monitoring: continuous pulse ox, blood pressure, heart rate and cardiac monitor Approach: midline Location: L3-L4 Injection technique: LOR air  Needle:  Needle type: Tuohy  Needle gauge: 17 G Needle length: 9 cm Needle insertion depth: 7 cm Catheter type: closed end flexible Catheter size: 19 Gauge Catheter at skin depth: 12 cm Test dose: negative  Assessment Sensory level: T8 Events: blood not aspirated, injection not painful, no injection resistance, no paresthesia and negative IV test  Additional Notes Patient identified. Risks/Benefits/Options discussed with patient including but not limited to bleeding, infection, nerve damage, paralysis, failed block, incomplete pain control, headache, blood pressure changes, nausea, vomiting, reactions to medication both or allergic, itching and postpartum back pain. Confirmed with bedside nurse the patient's most recent platelet count. Confirmed with patient that they are not currently taking any anticoagulation, have any bleeding history or any family history of bleeding disorders. Patient expressed understanding and wished to proceed. All questions were answered. Sterile technique was used throughout the entire procedure. Please see nursing notes for vital signs. Test dose was given through epidural catheter and negative prior to continuing to dose epidural or start infusion. Warning signs of high block given to the patient including shortness of breath, tingling/numbness in hands, complete motor  block, or any concerning symptoms with instructions to call for help. Patient was given instructions on fall risk and not to get out of bed. All questions and concerns addressed with instructions to call with any issues or inadequate analgesia.  Reason for block:procedure for pain

## 2020-04-01 NOTE — Progress Notes (Signed)
[name] is a 39 y.o. [id number] at [redacted]w[redacted]d admitted for IOL 2/2 worsening hypertension  Subjective: Getting more comfortable with epidural.   Objective: BP (!) 157/78   Pulse (!) 108   Temp 98.5 F (36.9 C) (Oral)   Resp 18   Ht 6\' 1"  (1.854 m)   Wt (!) 153.5 kg   LMP 07/17/2019 (LMP Unknown)   BMI 44.63 kg/m  No intake/output data recorded.  FHT:  FHR: 125 bpm, variability: moderate,  accelerations:  Present,  decelerations: 2 min prolonged UC:   q1-22m but not adequate MVU's  SVE:   Dilation: 7 Effacement (%): 90 Station: -2 Exam by:: J Mbugua RN  Labs: Lab Results  Component Value Date   WBC 7.6 03/31/2020   HGB 9.0 (L) 03/31/2020   HCT 29.0 (L) 03/31/2020   MCV 83.1 03/31/2020   PLT 203 03/31/2020    Assessment / Plan: 39 yo 40 at 37.0 EGA with worsening cHTN here for IOL.  Labor: s/p FB. S/p AROM and IUPC placement. Pit has been at 30 and would like to avoid going higher in [facility] patient. Due to frequency of ctx without adequate strength, will decrease Pit back to 20 and titrate back up PRN. Anticipate VBAC.  Fetal Wellbeing:  Category II; reassuring for moderate variability and accels  Pain Control:  per patient request Worsening cHTN: Mild range pressures. Restarted home Labetalol med. Pr/Cr and CMP WNL. I/D:  GBS pos, PCN  [facility] , MD OB Fellow, Faculty Practice 04/01/2020, 3:03 AM

## 2020-04-02 ENCOUNTER — Telehealth: Payer: Self-pay

## 2020-04-02 MED ORDER — SERTRALINE HCL 25 MG PO TABS
25.0000 mg | ORAL_TABLET | Freq: Every day | ORAL | 3 refills | Status: DC
Start: 2020-04-02 — End: 2020-05-01

## 2020-04-02 MED ORDER — FAMOTIDINE 20 MG PO TABS
20.0000 mg | ORAL_TABLET | Freq: Every day | ORAL | Status: DC | PRN
  Administered 2020-04-02: 20 mg via ORAL
  Filled 2020-04-02: qty 1

## 2020-04-02 MED ORDER — IBUPROFEN 800 MG PO TABS: 800.0000 mg | ORAL_TABLET | Freq: Three times a day (TID) | ORAL | 0 refills | Status: DC | PRN

## 2020-04-02 MED ORDER — BUPROPION HCL ER (XL) 150 MG PO TB24
150.0000 mg | ORAL_TABLET | ORAL | 3 refills | Status: DC
Start: 2020-04-02 — End: 2020-08-02

## 2020-04-02 MED ORDER — AMLODIPINE BESYLATE 10 MG PO TABS
10.0000 mg | ORAL_TABLET | Freq: Every day | ORAL | 11 refills | Status: DC
Start: 2020-04-02 — End: 2020-08-02

## 2020-04-02 MED ORDER — ACETAMINOPHEN 325 MG PO TABS: 650.0000 mg | ORAL_TABLET | Freq: Four times a day (QID) | ORAL | 0 refills | Status: DC | PRN

## 2020-04-02 MED ORDER — BUPROPION HCL ER (XL) 150 MG PO TB24
150.0000 mg | ORAL_TABLET | ORAL | 0 refills | Status: DC
Start: 2020-04-02 — End: 2020-04-02

## 2020-04-02 MED ORDER — SERTRALINE HCL 25 MG PO TABS
25.0000 mg | ORAL_TABLET | Freq: Every day | ORAL | 0 refills | Status: DC
Start: 2020-04-02 — End: 2020-04-02

## 2020-04-02 MED FILL — buPROPion HCL ER (XL) 150 M: 150 | 30 days supply | Qty: 30 | Fill #0

## 2020-04-02 MED FILL — ACETAMINOPHEN 325 MG TABS: 325 | 5 days supply | Qty: 30 | Fill #0

## 2020-04-02 MED FILL — IBUPROFEN 800 MG TAB: 800 | 10 days supply | Qty: 30 | Fill #0

## 2020-04-02 MED FILL — SERTRALINE HCL 25 MG TABS: 25 | 30 days supply | Qty: 30 | Fill #0

## 2020-04-02 NOTE — Progress Notes (Signed)
Post Partum Day 1 Subjective: no complaints, up ad lib, voiding and tolerating PO, small lochia, plans to breastfeed, plans to bottle feed, no method (has had counseling, aware of options).   Objective: Blood pressure 123/66, pulse 78, temperature 97.8 F (36.6 C), temperature source Oral, resp. rate 18, height 6\' 1"  (1.854 m), weight (!) 153.5 kg, last menstrual period 07/17/2019, SpO2 99 %, unknown if currently breastfeeding.  Physical Exam:  General: alert, cooperative and no distress Lochia:normal flow Chest: CTAB Heart: RRR no m/r/g Abdomen: +BS, soft, nontender,  Uterine Fundus: firm DVT Evaluation: No evidence of DVT seen on physical exam. Extremities: trace edema  Recent Labs    03/31/20 2345 04/01/20 1837  HGB 9.0* 8.6*  HCT 29.0* 27.4*    Assessment/Plan: Plan for discharge tomorrow (may want to go home later today, will play by ear)   LOS: 2 days   04/03/20 04/02/2020, 7:52 AM

## 2020-04-02 NOTE — Progress Notes (Signed)
CSW received consult due to score 12  on Edinburgh Depression Screen.    CSW congratulated [name] and FOB the birth of infant. CSW advised [name] and FOB of the HIPPA policy in which [name] was agreeable to FOB remaining in the room. CSW understanding and advised [name] of CSW's role and the reason for CSW coming to speak with her. [name] reported that she has dealt with anxiety most of her life. [name] reported that her anxiety was well during her pregnancy however toward the end her anxiety increased. [name] reported to CSW that she was on Wellbutrin in the past but hasn't been on anything recently. [name] did express a desire to speak with someone regarding being started on medications for her anxiety. CSW was informed by [name] that it was okay for CSW to reach out to [facility] provider to see if someone could assist [name] with medication needs for  anxiety. CSW also offered [name] therapy resources in which [name] was agreeable to. [name] expressed to this CSW that the last 7 days her anxiety was just up. [name] disclosed to CSW that she is usually able to recognize her anxiety as it begins. [name] reported that at this current and since giving birth she hasn't felt anxious. [name] reported no feelings of SI or HI to CSW.   CSW inquired from [name] on her mental health hx in which [name] reported no other hx. [name] informed CSW that she has all needed items to care for infant as well as supports from her spouse and family. [name] reported feeling good mentally since she gave birth and expressed no other concerns or needs to CSW,  CSW has spoken with OB and was advised that someone would follow up with [name] for further medication needs.   CSW provided education regarding Baby Blues vs PMADs and provided [name] with resources for mental health follow up.  CSW encouraged [name] to evaluate her mental health throughout the postpartum period with the use of the New Mom Checklist developed by Postpartum Progress as well as the [place] Postnatal Depression Scale and notify a  medical professional if symptoms arise.     Claude Manges Deasha Clendenin, MSW, LCSW [facility] [phone]

## 2020-04-02 NOTE — Telephone Encounter (Signed)
Patient advised the office does not seen children under the age of 40 years old.

## 2020-04-02 NOTE — Telephone Encounter (Signed)
Copied from CRM [id number]. Topic: General - Other >> Apr 01, 2020  4:18 PM Dalphine Handing A wrote: Patient would like to know if Dr. Sherrie Mustache would be able to see her newborn daughter to check her over. Advised patient that he only sees patients 7an up but she insisted a message be sent do to the fact she is choosing not to vaccinate the patient. Please advise

## 2020-04-04 ENCOUNTER — Ambulatory Visit: Payer: Medicaid Other

## 2020-04-04 ENCOUNTER — Inpatient Hospital Stay (HOSPITAL_COMMUNITY): Payer: Medicaid Other

## 2020-04-04 ENCOUNTER — Inpatient Hospital Stay (HOSPITAL_COMMUNITY): Admission: AD | Admit: 2020-04-04 | Payer: Medicaid Other | Source: Home / Self Care | Admitting: Family Medicine

## 2020-04-05 ENCOUNTER — Other Ambulatory Visit: Payer: Self-pay | Admitting: *Deleted

## 2020-04-05 MED ORDER — NITROFURANTOIN MONOHYD MACRO 100 MG PO CAPS: 100.0000 mg | ORAL_CAPSULE | Freq: Two times a day (BID) | ORAL | 0 refills | Status: DC

## 2020-04-08 ENCOUNTER — Telehealth: Payer: Self-pay | Admitting: Radiology

## 2020-04-08 NOTE — Telephone Encounter (Signed)
Spoke with patient about Postpartum GTT testing, patient states that she wants to wait a couple of months before she retakes the test

## 2020-04-09 NOTE — BH Specialist Note (Signed)
Integrated Behavioral Health via Telemedicine Phone (Caregility) Visit  04/09/2020 [name] [id number]  Number of Integrated Behavioral Health visits: 1 Session Start time: 1:38  Session End time: 2:04 Total time: 26  Referring Provider: Merian Capron, MD Type of Visit: Video Patient/Family location: Home [facility] Provider location: Center for [facility] at [facility]  All persons participating in visit: Patient [name] and [facility] [name]    Confirmed patient's address: Yes  Confirmed patient's phone number: Yes  Any changes to demographics: No   Confirmed patient's insurance: Yes  Any changes to patient's insurance: No   Discussed confidentiality: Yes   I connected with Lise Auer Schellinger  by a video enabled telemedicine application (Caregility) and verified that I am speaking with the correct person using two identifiers.     I discussed the limitations of evaluation and management by telemedicine and the availability of in person appointments.  I discussed that the purpose of this visit is to provide behavioral health care while limiting exposure to the novel coronavirus.   Discussed there is a possibility of technology failure and discussed alternative modes of communication if that failure occurs.  I discussed that engaging in this virtual visit, they consent to the provision of behavioral healthcare and the services will be billed under their insurance.  Patient and/or legal guardian expressed understanding and consented to virtual visit: Yes   PRESENTING CONCERNS: Patient and/or family reports the following symptoms/concerns: Pt states her primary concern today is a continual smell of cigarettes for the past four days since beginning Wellbutrin and Zoloft(first day was off and on smell only), with no other side effects; pt also grieving loss of friends' babies.  Duration of problem: Postpartum; Severity of problem:  moderate  STRENGTHS (Protective Factors/Coping Skills): Supportive friends and family; strong self-awareness; adhering to treatment   GOALS ADDRESSED: Patient will: 1.  Reduce symptoms of: anxiety and stress  2.  Increase knowledge and/or ability of: healthy habits  3.  Demonstrate ability to: Increase healthy adjustment to current life circumstances and Continue healthy grieving with friends over losses  INTERVENTIONS: Interventions utilized:  Medication Monitoring and Psychoeducation and/or Health Education Standardized Assessments completed: GAD-7 and PHQ 9  ASSESSMENT: Patient currently experiencing Adjustment disorder with anxiety and Grief.   Patient may benefit from psychoeducation and brief therapeutic interventions regarding coping with symptoms of anxiety and life stress with recent grief .  PLAN: 1. Follow up with behavioral health clinician on : Two weeks 2. Behavioral recommendations:  -Continue taking BH medications as prescribed -Continue taking prenatal vitamin daily until at least postpartum visit -Continue prioritizing healthy sleep and healthy meals daily (including high iron foods combined with high vit C foods) until postpartum medical visit -Continue using essential oils to help relieve unpleasant smell, for as long as remains helpful -Consider sharing grief resources with friends, as needed 3. Referral(s): Integrated [employer] (In Clinic)  I discussed the assessment and treatment plan with the patient and/or parent/guardian. They were provided an opportunity to ask questions and all were answered. They agreed with the plan and demonstrated an understanding of the instructions.   They were advised to call back or seek an in-person evaluation if the symptoms worsen or if the condition fails to improve as anticipated.  Valetta Close Helder Crisafulli  Depression screen [facility] 2/9 04/17/2020 12/03/2017 05/23/2015  Decreased Interest 1 1 0  Down, Depressed,  Hopeless 1 1 0  PHQ - 2 Score 2 2 0  Altered sleeping 0 0 -  Tired, decreased energy 0 0 -  Change in appetite 0 0 -  Feeling bad or failure about yourself  1 1 -  Trouble concentrating 3 0 -  Moving slowly or fidgety/restless 0 0 -  Suicidal thoughts 0 0 -  PHQ-9 Score 6 3 -  Difficult doing work/chores - Not difficult at all -  Some recent data might be hidden   GAD 7 : Generalized Anxiety Score 04/17/2020  Nervous, Anxious, on Edge 2  Control/stop worrying 3  Worry too much - different things 3  Trouble relaxing 0  Restless 0  Easily annoyed or irritable 3  Afraid - awful might happen 3  Total GAD 7 Score 14

## 2020-04-11 ENCOUNTER — Ambulatory Visit: Payer: Medicaid Other

## 2020-04-11 ENCOUNTER — Encounter: Payer: Medicaid Other | Admitting: Obstetrics and Gynecology

## 2020-04-17 ENCOUNTER — Other Ambulatory Visit: Payer: Self-pay

## 2020-04-17 ENCOUNTER — Ambulatory Visit (INDEPENDENT_AMBULATORY_CARE_PROVIDER_SITE_OTHER): Payer: Medicaid Other | Admitting: Clinical

## 2020-04-17 DIAGNOSIS — O99345 Other mental disorders complicating the puerperium: Secondary | ICD-10-CM

## 2020-04-17 DIAGNOSIS — F4322 Adjustment disorder with anxiety: Secondary | ICD-10-CM | POA: Diagnosis not present

## 2020-04-17 DIAGNOSIS — F4321 Adjustment disorder with depressed mood: Secondary | ICD-10-CM

## 2020-04-18 NOTE — Patient Instructions (Signed)
Center for [facility] at [facility] [address] [place], [place] [address] [phone] (main office) [phone] (Destan Franchini's office)   These are resources that have been helpful to women and families experiencing grief:   [place] virtual bereavement support group will be facilitated by pastoral care and perinatal education.  To start, this group will meet once a month. The registration location for this support group is on 's website > your wellbeing > classes and support groups > support groups  [facility] (Individual and group grief support)   [facility]  [phone]   Also:  [email or web address]  www.[email or web address]  www.[email or web address]  www.nilmdts.org        www.[email or web address]  www.rtzhope.org

## 2020-05-01 ENCOUNTER — Ambulatory Visit (INDEPENDENT_AMBULATORY_CARE_PROVIDER_SITE_OTHER): Payer: Medicaid Other | Admitting: Family Medicine

## 2020-05-01 ENCOUNTER — Other Ambulatory Visit: Payer: Self-pay

## 2020-05-01 ENCOUNTER — Encounter: Payer: Self-pay | Admitting: Family Medicine

## 2020-05-01 VITALS — BP 166/104 | HR 104 | Wt 314.0 lb

## 2020-05-01 DIAGNOSIS — O1003 Pre-existing essential hypertension complicating the puerperium: Secondary | ICD-10-CM

## 2020-05-01 DIAGNOSIS — I10 Essential (primary) hypertension: Secondary | ICD-10-CM

## 2020-05-01 DIAGNOSIS — O99345 Other mental disorders complicating the puerperium: Secondary | ICD-10-CM

## 2020-05-01 DIAGNOSIS — F411 Generalized anxiety disorder: Secondary | ICD-10-CM

## 2020-05-01 MED ORDER — SERTRALINE HCL 50 MG PO TABS: 50.0000 mg | ORAL_TABLET | Freq: Every day | ORAL | 3 refills | Status: DC

## 2020-05-01 MED ORDER — FUROSEMIDE 40 MG PO TABS
40.0000 mg | ORAL_TABLET | Freq: Two times a day (BID) | ORAL | 1 refills | Status: DC
Start: 2020-05-01 — End: 2020-06-03

## 2020-05-01 NOTE — Progress Notes (Signed)
    Post Partum Visit Note  [name] is a 39 y.o. [id number] female who presents for a postpartum visit. She is four weeks postpartum following a method vaginal delivery.  I have fully reviewed the prenatal and intrapartum course. The delivery was at 37 gestational weeks.  Anesthesia: Epidual. Postpartum course has been uncomplicated. Baby is doing well. Baby is feeding by breast/bottle. Bleeding yes. Bowel function is normal. Bladder function is normal. Patient is sexually active. Contraception method is nothing at this. Postpartum depression screening: negative.  The following portions of the patient's history were reviewed and updated as appropriate: allergies, current medications, past family history, past medical history, past social history, past surgical history and problem list.  Review of Systems Pertinent items noted in HPI and remainder of comprehensive ROS otherwise negative.    Objective:  Blood pressure (!) 166/104, pulse (!) 104, weight (!) 314 lb (142.4 kg), unknown if currently breastfeeding.  General:  alert, cooperative and appears stated age  Lungs: normal effort  Heart:  regular rate and rhythm  Abdomen: soft, non-tender; bowel sounds normal; no masses,  no organomegaly        Assessment:    Postpartum exam. Pap smear not done at today's visit.   Plan:   Essential components of care per ACOG recommendations:  1.  Mood and well being: Patient with negative depression screening today. On Wellbutrin and Zoloft (low dose) to help with anxiety--will increase to 50. She will let me know if needs another increase.  2. Infant care and feeding:  -Patient currently breastmilk feeding? Yes   3. Sexuality, contraception and birth spacing - Patient deos not want contraception  4. Sleep and fatigue -Encouraged family/partner/community support of 4 hrs of uninterrupted sleep to help with mood and fatigue  5. Physical Recovery  - Discussed patients delivery VBAC and  no known complications - Patient had no lacerations - Patient has urinary incontinence? No Having some urinary symptoms of UTI--will let us know if needs culture. - Patient is safe to resume physical and sexual activity  6.  Health Maintenance - Last pap smear done 2019 and was normal with negative HPV. Mammogram not due yet  7. Chronic Disease - stopped CCB due to LE edema--given lasix--if/when stops having babies, could consider ARB or ACE-I--continue healthy diet and exercise. Will check A1C in 3 months.  Reva Bores, MD Center for [employer], [facility]

## 2020-05-06 ENCOUNTER — Ambulatory Visit: Payer: Medicaid Other | Admitting: Clinical

## 2020-05-06 ENCOUNTER — Other Ambulatory Visit: Payer: Self-pay

## 2020-05-06 NOTE — BH Specialist Note (Signed)
Erroneous encounter; pt will call back to reschedule, due to scheduling conflict

## 2020-05-30 IMAGING — US US MFM FETAL BPP W/O NON-STRESS
1 series · 14 of 28 positions shown · non-contrast
Comparison: none

[Series 1: us mfm fetal bpp w/o non-stress · 48 acquisitions, 14 frames shown]
[im 2/48]
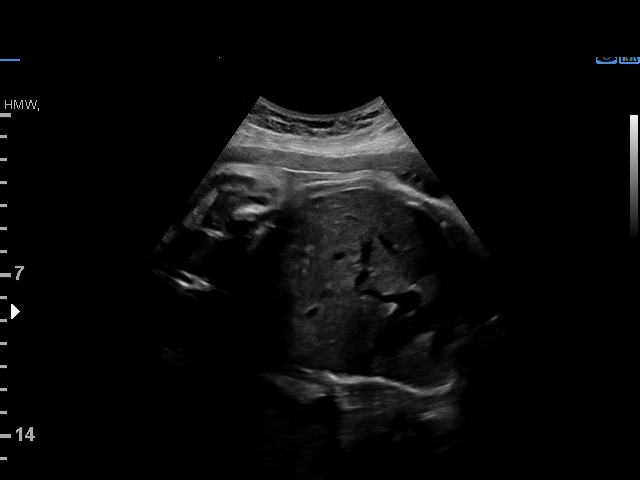
[im 6/48]
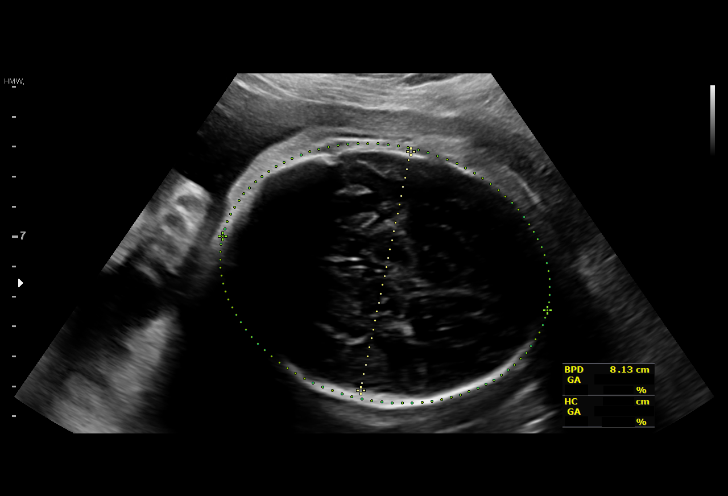
[im 9/48]
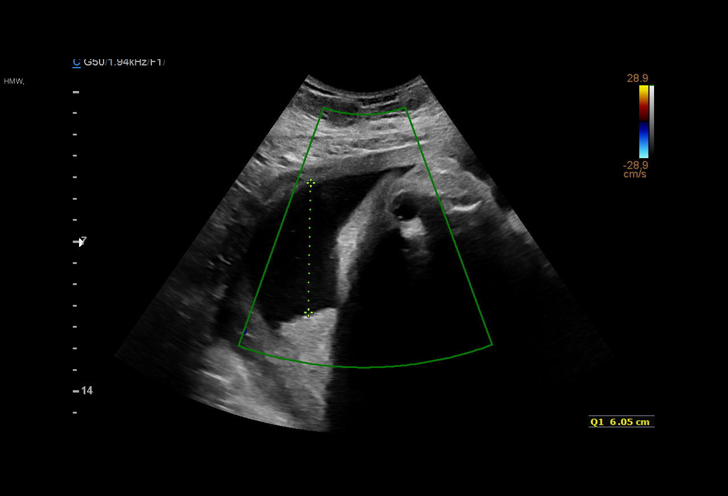
[im 13/48]
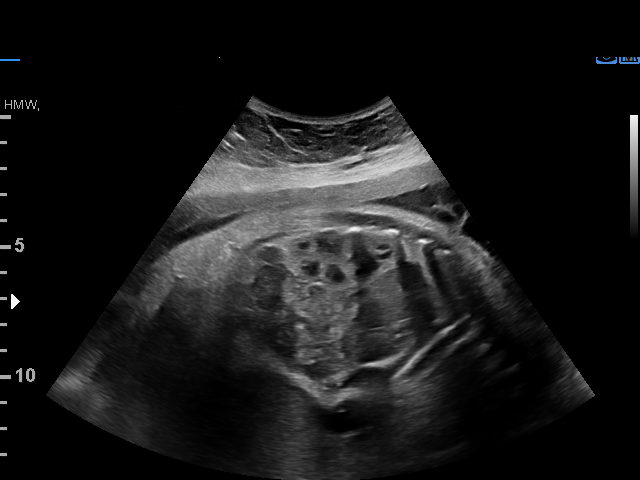
[im 16/48]
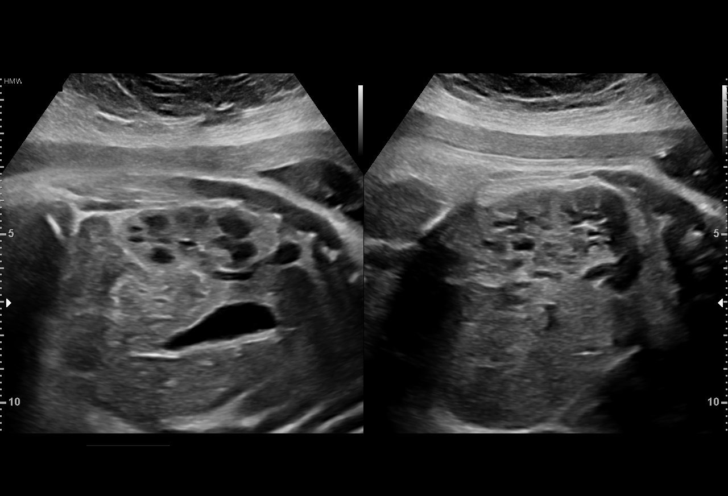
[im 20/48]
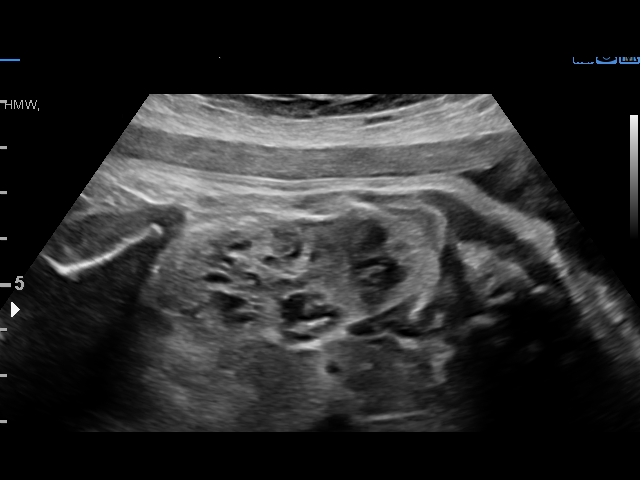
[im 23/48]
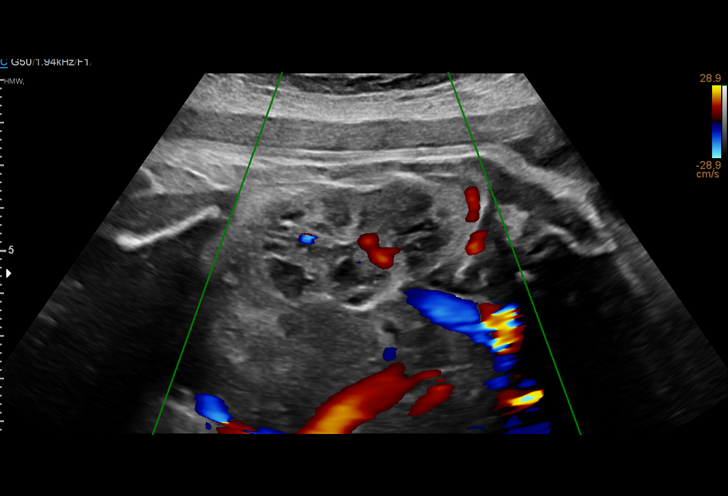
[im 27/48]
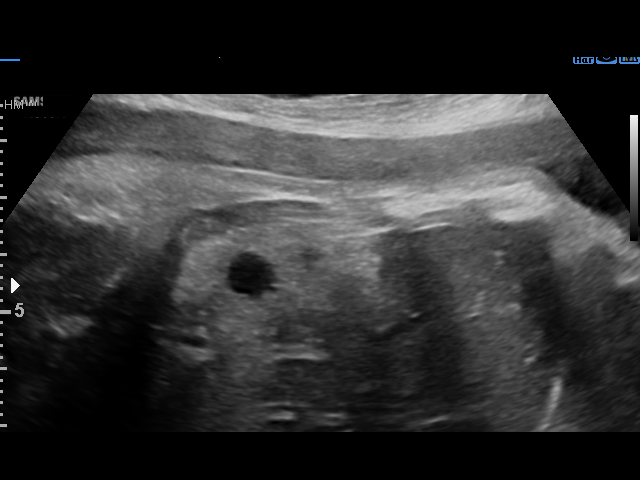
[im 30/48]
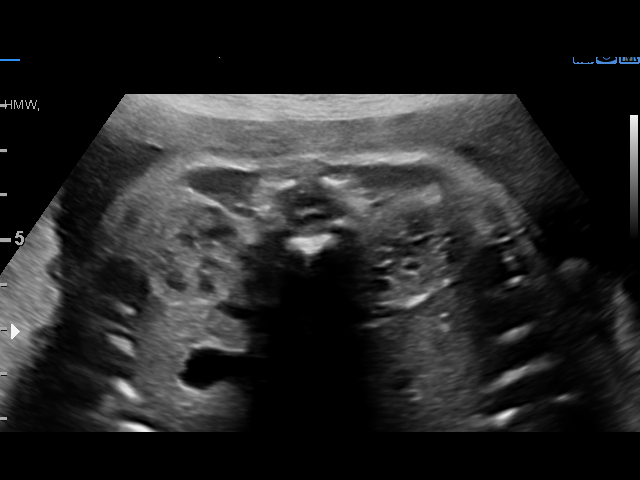
[im 34/48]
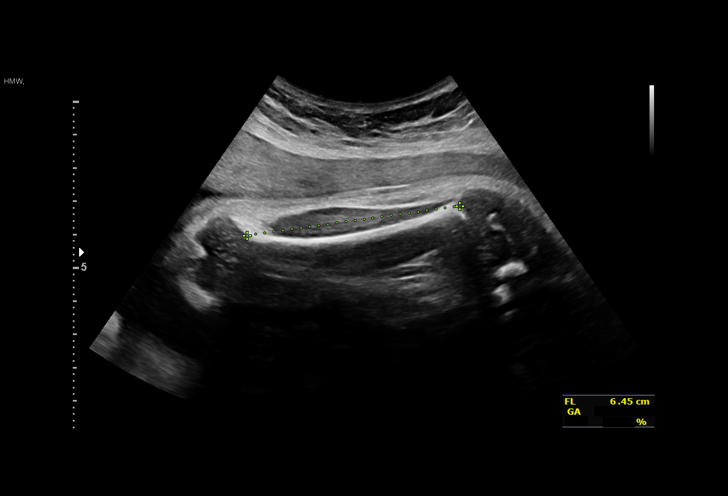
[im 37/48]
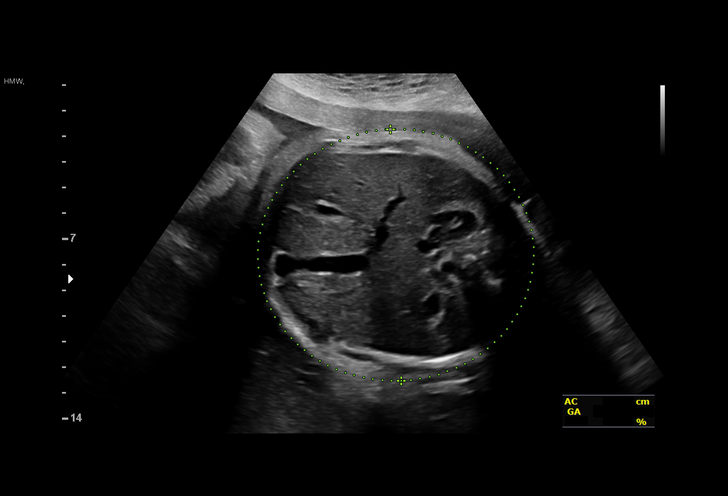
[im 41/48]
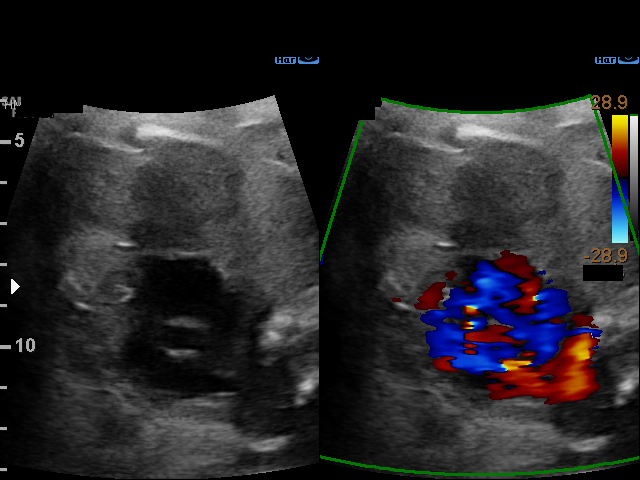
[im 44/48]
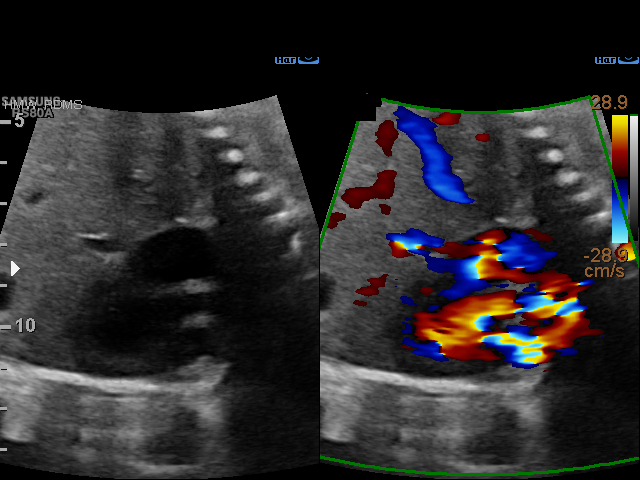
[im 48/48]
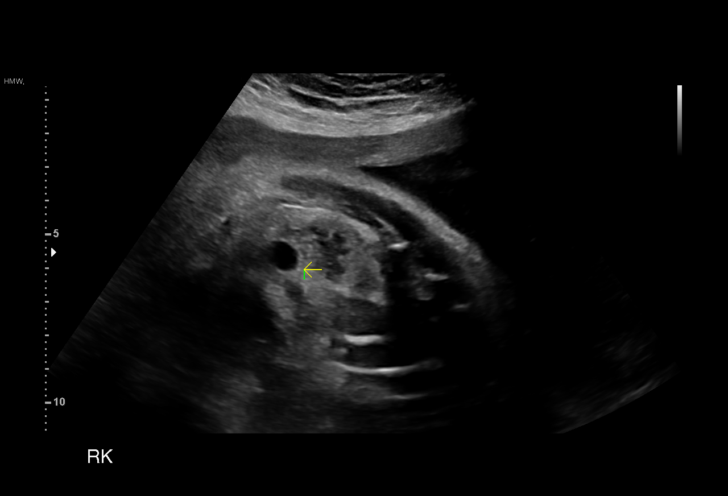

[14 of 28 positions shown; findings below may reference images not displayed]

Indications

 34 weeks gestation of pregnancy
 Encounter for other antenatal screening
 follow-up
 Poor obstetric history: Previous
 preeclampsia / eclampsia/gestational HTN
 Poor obstetric history: Previous gestational
 diabetes
 Advanced maternal age multigravida 35+,
 second trimester
 History of cesarean delivery, currently
 pregnant
 Poor obstetric history: Previous preterm
 delivery, antepartum (33 wks, c-section, due
 to preeclampsia)
 Obesity complicating pregnancy, second
 trimester
Fetal Evaluation

 Num Of Fetuses:         1
 Cardiac Activity:       Observed
 Presentation:           Cephalic
 Placenta:               Posterior
 P. Cord Insertion:      Previously Visualized

 Amniotic Fluid
 AFI FV:      Within normal limits

 AFI Sum(cm)     %Tile       Largest Pocket(cm)
 15.5            56

 RUQ(cm)       RLQ(cm)       LUQ(cm)        LLQ(cm)

Biophysical Evaluation

 Amniotic F.V:   Within normal limits       F. Tone:        Observed
 F. Movement:    Observed                   Score:          [DATE]
 F. Breathing:   Observed
Biometry

 BPD:      81.3  mm     G. Age:  32w 5d         10  %    CI:        69.99   %    70 - 86
                                                         FL/HC:      20.6   %    19.4 -
 HC:       310   mm     G. Age:  34w 4d         23  %    HC/AC:      0.96        0.96 -
 AC:       323   mm     G. Age:  36w 2d         94  %    FL/BPD:     78.7   %    71 - 87
 FL:         64  mm     G. Age:  33w 0d         13  %    FL/AC:      19.8   %    20 - 24

 Est. FW:    8082  gm      5 lb 9 oz     62  %
OB History

 Blood Type:   B+
 Gravidity:    10        Term:   5        Prem:   1        SAB:   3
 TOP:          0       Ectopic:  0        Living: 6
Gestational Age

 LMP:           34w 2d        Date:  07/18/19                 EDD:   04/23/20
 U/S Today:     34w 1d                                        EDD:   04/24/20
 Best:          34w 2d     Det. By:  LMP  (07/18/19)          EDD:   04/23/20
Anatomy

 Cranium:               Appears normal         Aortic Arch:            Previously seen
 Cavum:                 Previously seen        Ductal Arch:            Previously seen
 Ventricles:            Appears normal         Diaphragm:              Previously seen
 Choroid Plexus:        Previously seen        Stomach:                Appears normal, left
                                                                       sided
 Cerebellum:            Previously seen        Abdomen:                Previously seen
 Posterior Fossa:       Previously seen        Abdominal Wall:         Previously seen
 Nuchal Fold:           Not applicable (>20    Cord Vessels:           Not well visualized
                        wks GA)
 Face:                  Orbits and profile     Kidneys:                Right renal cyst,
                        previously seen                                see comments
 Lips:                  Previously seen        Bladder:                Appears normal
 Thoracic:              Appears normal         Spine:                  Previously seen
 Heart:                 Not well visualized    Upper Extremities:      Previously seen
 RVOT:                  Appears normal         Lower Extremities:      Previously seen
 LVOT:                  Not well visualized

 Other:  Technically difficult due to maternal habitus and fetal position.
Cervix Uterus Adnexa

 Cervix
 Not visualized (advanced GA >54wks)
Impression

 Patient with chronic hypertension return for antenatal testing.
 She takes labetalol and her blood pressures are reportedly
 well controlled.  Blood pressure today at our office is 130/82
 mmHg.
 Amniotic fluid is normal and good fetal activity seen.
 Antenatal testing is reassuring.  BPP [DATE].  A solitary right
 renal cyst is seen in the inferior pole.  Calyces are prominent,
 but corticomedullary differentiation is seen.
 I explained that right renal cyst is usually benign.  However
 only postnatal ultrasound evaluation will determine the
 diagnosis.
Recommendations

 -Continue weekly BPP till delivery.
                      Leen, [place]

## 2020-06-03 ENCOUNTER — Other Ambulatory Visit: Payer: Self-pay | Admitting: Family Medicine

## 2020-06-05 ENCOUNTER — Telehealth: Payer: Self-pay

## 2020-06-05 ENCOUNTER — Ambulatory Visit
Admission: EM | Admit: 2020-06-05 | Discharge: 2020-06-05 | Disposition: A | Discharge status: Home / Self Care | Payer: Medicaid Other | Attending: Emergency Medicine | Admitting: Emergency Medicine

## 2020-06-05 DIAGNOSIS — R030 Elevated blood-pressure reading, without diagnosis of hypertension: Secondary | ICD-10-CM | POA: Diagnosis not present

## 2020-06-05 DIAGNOSIS — N390 Urinary tract infection, site not specified: Secondary | ICD-10-CM | POA: Diagnosis not present

## 2020-06-05 LAB — POCT URINALYSIS DIP (MANUAL ENTRY)
Bilirubin, UA: NEGATIVE
Glucose, UA: NEGATIVE mg/dL
Nitrite, UA: POSITIVE — AB
Protein Ur, POC: 100 mg/dL — AB
Spec Grav, UA: >=1.03 — AB (ref 1.010–1.025)
Urobilinogen, UA: 0.2 E.U./dL
pH, UA: 5.5 (ref 5.0–8.0)

## 2020-06-05 MED ORDER — CEPHALEXIN 500 MG PO CAPS: 500.0000 mg | ORAL_CAPSULE | Freq: Two times a day (BID) | ORAL | 0 refills | Status: AC

## 2020-06-05 NOTE — Telephone Encounter (Signed)
Pt was seen today at the urgent care.

## 2020-06-05 NOTE — Telephone Encounter (Signed)
Virtual and can drop off urine at hospital lab, thanks.

## 2020-06-05 NOTE — Discharge Instructions (Addendum)
Take the antibiotic as directed.  Urine culture is pending.  We will call you if the culture indicates the need to change or discontinue your antibiotic.  Follow-up with your primary care provider if your symptoms are not improving.    Your blood pressure is elevated today at 152/98.  Please have this rechecked by your primary care provider in 2-4 weeks.

## 2020-06-05 NOTE — ED Triage Notes (Signed)
Patient complains of bilateral flank pain that radiates into the lower abdomen, frequency and burning with urination, and urgency since last week

## 2020-06-05 NOTE — Telephone Encounter (Signed)
Patient called office this morning with concerns of flank pain, nausea, dysuria, urgency and fever that has been occurring for the past 48hrs. I suggested offering virtual appointment for patient because she complained of fever and body aches but she states that she did not want virtual appointment to be assessed for a UTI and is asking if she can be seen in person. Please advise if appropriate to come in office. KW

## 2020-06-05 NOTE — ED Provider Notes (Signed)
Renaldo Fiddler    CSN: [id number] Arrival date & time: 06/05/20  1208      History   Chief Complaint Chief Complaint  Patient presents with  . Dysuria  . Flank Pain    HPI [name] is a 39 y.o. female.   Patient presents with dysuria, malodorous urine, frequency, urgency x1 week.  She also reports feeling warm but has not taken her temperature.  She reports lower abdominal discomfort and bilateral flank pain.  Treatment attempted at home with Tylenol and ibuprofen.  She denies rash, vomiting, diarrhea, vaginal discharge, pelvic pain, or other symptoms.  The history is provided by the patient.    Past Medical History:  Diagnosis Date  . Allergic rhinitis 09/06/2015  . Anginal pain (HCC)   . Anxiety   . Deaf, left   . Excess, menstruation 09/06/2015  . Gestational diabetes 2008    glyburide this pregnancy  . History of chicken pox   . History of COVID-19 03/11/2020   April 2021  . History of gestational diabetes 04/16/2016   Early a1c neg  . Hydronephrosis, right 11/08/2012  . Insomnia 09/06/2015  . Kidney stones 2013   13 passed  . Nephrolithiasis 11/29/2012  . Pregnancy induced hypertension    labetolol  . Preterm labor    delivery    Patient Active Problem List   Diagnosis Date Noted  . History of cesarean section 04/01/2020  . BMI 40.0-44.9, adult (HCC) 12/19/2019  . Depression, major, single episode 09/27/2018  . History of gestational diabetes 04/16/2016  . Episodic paroxysmal anxiety disorder 09/06/2015  . Vitamin D insufficiency 11/30/2014  . Chronic fatigue 11/27/2014  . History of VBAC 11/27/2014  . Benign essential hypertension 04/28/2013  . Anxiety with flying   . GERD 12/23/2007  . Acquired cyst of kidney 12/23/2007    Past Surgical History:  Procedure Laterality Date  . CESAREAN SECTION  2006  . WISDOM TOOTH EXTRACTION     x4    OB History    Gravida  10   Para  7   Term  6   Preterm  1   AB  3   Living  7      SAB  3   TAB      Ectopic      Multiple  0   Live Births  7            Home Medications    Prior to Admission medications   Medication Sig Start Date End Date Taking? Authorizing Provider  acetaminophen (TYLENOL) 325 MG tablet Take 2 tablets (650 mg total) by mouth every 6 (six) hours as needed (for pain scale < 4). Patient not taking: Reported on 05/01/2020 04/02/20   Reva Bores, MD  amLODipine (NORVASC) 10 MG tablet Take 1 tablet (10 mg total) by mouth daily. Patient not taking: Reported on 05/01/2020 04/02/20 04/02/21  Reva Bores, MD  aspirin EC 81 MG tablet Take 1 tablet (81 mg total) by mouth daily. Patient not taking: Reported on 05/01/2020 11/06/19   Reva Bores, MD  buPROPion (WELLBUTRIN XL) 150 MG 24 hr tablet Take 1 tablet (150 mg total) by mouth every morning. 04/02/20 04/02/21  Reva Bores, MD  cephALEXin (KEFLEX) 500 MG capsule Take 1 capsule (500 mg total) by mouth 2 (two) times daily for 5 days. 06/05/20 06/10/20  Mickie Bail, NP  furosemide (LASIX) 40 MG tablet TAKE 1 TABLET BY MOUTH TWICE DAILY 06/03/20  Reva Bores, MD  ibuprofen (ADVIL) 800 MG tablet Take 1 tablet (800 mg total) by mouth every 8 (eight) hours as needed for mild pain. Patient not taking: Reported on 05/01/2020 04/02/20   Reva Bores, MD  Multiple Vitamins-Minerals (MULTIVITAMIN WITH MINERALS) tablet Take 1 tablet by mouth daily.    [provider]  nitrofurantoin, macrocrystal-monohydrate, (MACROBID) 100 MG capsule Take 1 capsule (100 mg total) by mouth 2 (two) times daily. Patient not taking: Reported on 05/01/2020 04/05/20   Reva Bores, MD  Prenatal Vit-Fe Fumarate-FA (PRENATAL MULTIVITAMIN) TABS tablet Take 1 tablet by mouth daily at 12 noon. Patient not taking: Reported on 03/28/2020    [provider]  sertraline (ZOLOFT) 50 MG tablet Take 1 tablet (50 mg total) by mouth daily. 05/01/20 05/01/21  Reva Bores, MD    Family History Family History  Problem  Relation Age of Onset  . Schizophrenia Maternal Grandmother   . Atrial fibrillation Mother   . Depression Mother   . Anxiety disorder Mother   . Mental illness Father   . Heart attack Maternal Grandfather   . Atrial fibrillation Paternal Grandfather   . Diabetes Paternal Grandfather   . Heart attack Paternal Grandfather   . Allergies Sister     Social History Social History   Tobacco Use  . Smoking status: Never Smoker  . Smokeless tobacco: Never Used  Vaping Use  . Vaping Use: Never used  Substance Use Topics  . Alcohol use: No    Alcohol/week: 0.0 standard drinks  . Drug use: No     Allergies   Ivp dye [iodinated diagnostic agents], Iodine, and Phenergan fortis [promethazine]   Review of Systems Review of Systems  Constitutional: Negative for chills and fever.  HENT: Negative for ear pain and sore throat.   Eyes: Negative for pain and visual disturbance.  Respiratory: Negative for cough and shortness of breath.   Cardiovascular: Negative for chest pain and palpitations.  Gastrointestinal: Positive for abdominal pain. Negative for diarrhea, nausea and vomiting.  Genitourinary: Positive for dysuria, flank pain, frequency and urgency. Negative for hematuria, pelvic pain and vaginal discharge.  Musculoskeletal: Negative for arthralgias and back pain.  Skin: Negative for color change and rash.  Neurological: Negative for seizures and syncope.  All other systems reviewed and are negative.    Physical Exam Triage Vital Signs ED Triage Vitals  Enc Vitals Group     BP 06/05/20 1210 (!) 152/98     Pulse Rate 06/05/20 1210 97     Resp 06/05/20 1210 17     Temp 06/05/20 1210 98.7 F (37.1 C)     Temp src --      SpO2 06/05/20 1210 97 %     Weight --      Height --      Head Circumference --      Peak Flow --      Pain Score 06/05/20 1209 6     Pain Loc --      Pain Edu? --      Excl. in GC? --    No data found.  Updated Vital Signs BP (!) 152/98   Pulse  97   Temp 98.7 F (37.1 C)   Resp 17   SpO2 97%   Visual Acuity Right Eye Distance:   Left Eye Distance:   Bilateral Distance:    Right Eye Near:   Left Eye Near:    Bilateral Near:  Physical Exam Vitals and nursing note reviewed.  Constitutional:      General: She is not in acute distress.    Appearance: She is well-developed. She is not ill-appearing.  HENT:     Head: Normocephalic and atraumatic.     Mouth/Throat:     Mouth: Mucous membranes are moist.     Pharynx: Oropharynx is clear.  Eyes:     Conjunctiva/sclera: Conjunctivae normal.  Cardiovascular:     Rate and Rhythm: Normal rate and regular rhythm.     Heart sounds: No murmur heard.   Pulmonary:     Effort: Pulmonary effort is normal. No respiratory distress.     Breath sounds: Normal breath sounds.  Abdominal:     Palpations: Abdomen is soft.     Tenderness: There is no abdominal tenderness. There is no right CVA tenderness, left CVA tenderness, guarding or rebound.  Musculoskeletal:     Cervical back: Neck supple.  Skin:    General: Skin is warm and dry.     Findings: No rash.  Neurological:     General: No focal deficit present.     Mental Status: She is alert and oriented to person, place, and time.     Gait: Gait normal.  Psychiatric:        Mood and Affect: Mood normal.        Behavior: Behavior normal.      UC Treatments / Results  Labs (all labs ordered are listed, but only abnormal results are displayed) Labs Reviewed  POCT URINALYSIS DIP (MANUAL ENTRY) - Abnormal; Notable for the following components:      Result Value   Color, UA other (*)    Clarity, UA cloudy (*)    Ketones, POC UA trace (5) (*)    Spec Grav, UA >=1.030 (*)    Blood, UA moderate (*)    Protein Ur, POC =100 (*)    Nitrite, UA Positive (*)    Leukocytes, UA Moderate (2+) (*)    All other components within normal limits  URINE CULTURE    EKG   Radiology No results found.  Procedures Procedures  (including critical care time)  Medications Ordered in UC Medications - No data to display  Initial Impression / Assessment and Plan / UC Course  I have reviewed the triage vital signs and the nursing notes.  Pertinent labs & imaging results that were available during my care of the patient were reviewed by me and considered in my medical decision making (see chart for details).   UTI. Elevated blood pressure reading. Urine culture pending. Treating with Keflex. Discussed with patient that we will call her if the urine culture indicates the need to change or discontinue her antibiotic. Instructed her to follow-up with her PCP if her symptoms are not improving. Discussed that her blood pressure is elevated today needs to be rechecked by her PCP in 2 to 4 weeks. Patient agrees to plan of care.     Final Clinical Impressions(s) / UC Diagnoses   Final diagnoses:  Urinary tract infection without hematuria, site unspecified  Elevated blood pressure reading     Discharge Instructions     Take the antibiotic as directed.  Urine culture is pending.  We will call you if the culture indicates the need to change or discontinue your antibiotic.  Follow-up with your primary care provider if your symptoms are not improving.    Your blood pressure is elevated today at 152/98.  Please have this  rechecked by your primary care provider in 2-4 weeks.            ED Prescriptions    Medication Sig Dispense Auth. Provider   cephALEXin (KEFLEX) 500 MG capsule Take 1 capsule (500 mg total) by mouth 2 (two) times daily for 5 days. 10 capsule Mickie Bail, NP     I have reviewed the PDMP during this encounter.   Mickie Bail, NP 06/05/20 1241

## 2020-06-07 LAB — URINE CULTURE: Culture: >=100000 — AB

## 2020-06-13 IMAGING — US US MFM FETAL BPP W/O NON-STRESS
1 series · 14 of 20 positions shown · non-contrast
Comparison: none

[Series 1: us mfm fetal bpp w/o non-stress · 20 acquisitions, 14 frames shown]
[im 1/20]
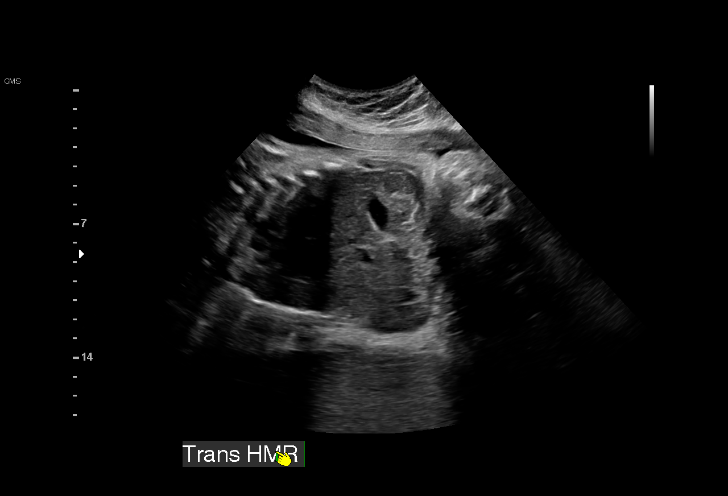
[im 3/20]
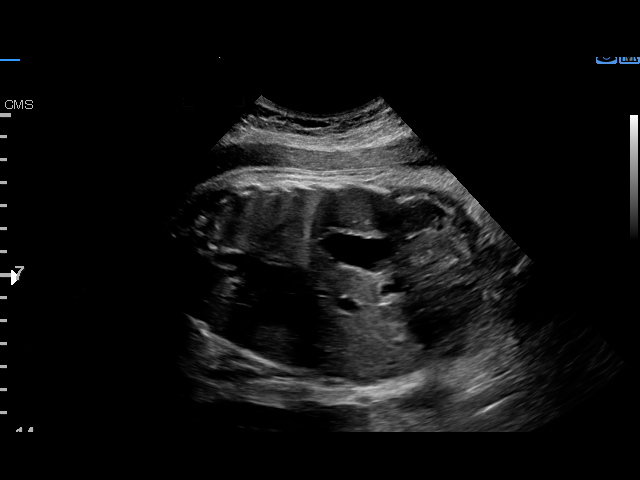
[im 4/20]
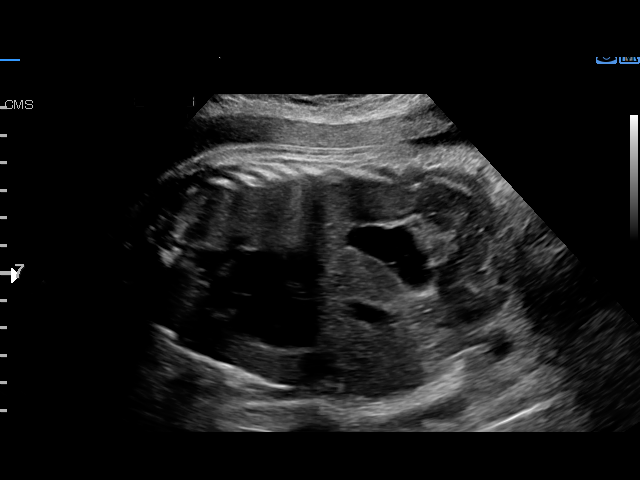
[im 6/20]
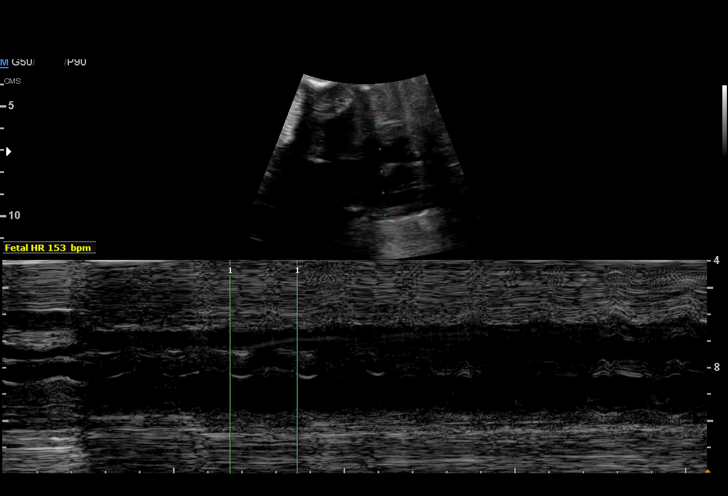
[im 7/20]
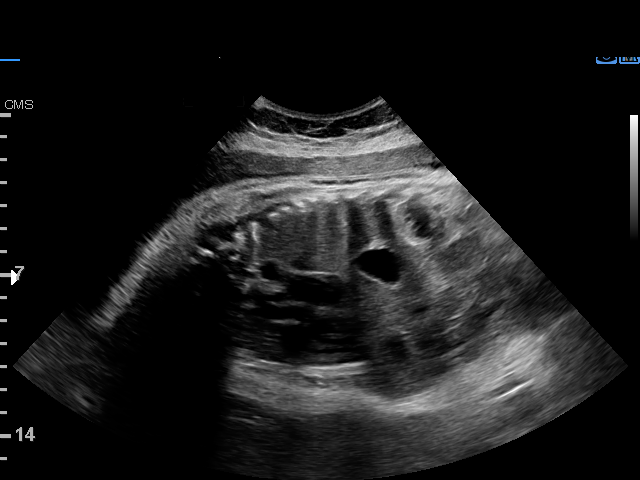
[im 8/20]
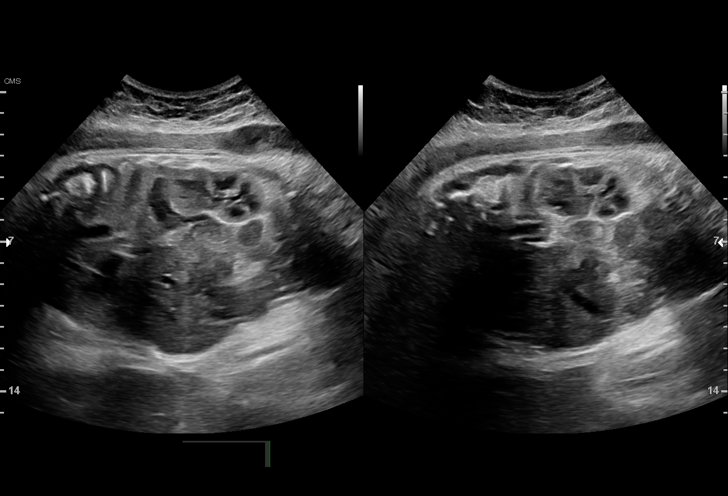
[im 10/20]
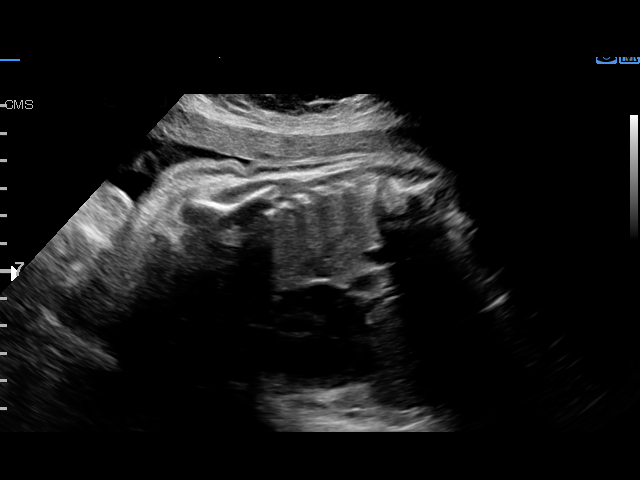
[im 11/20]
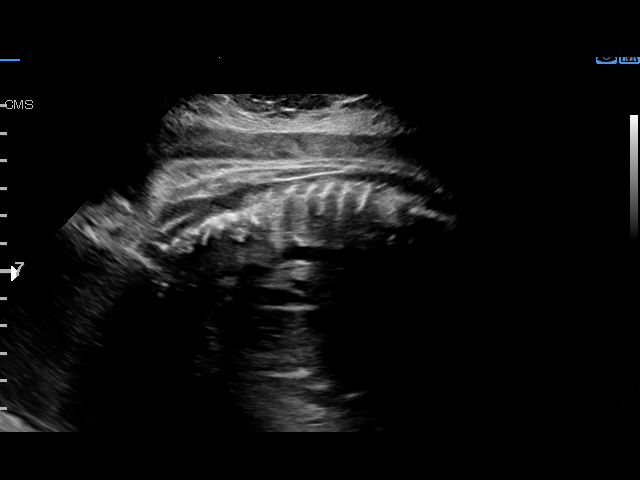
[im 13/20]
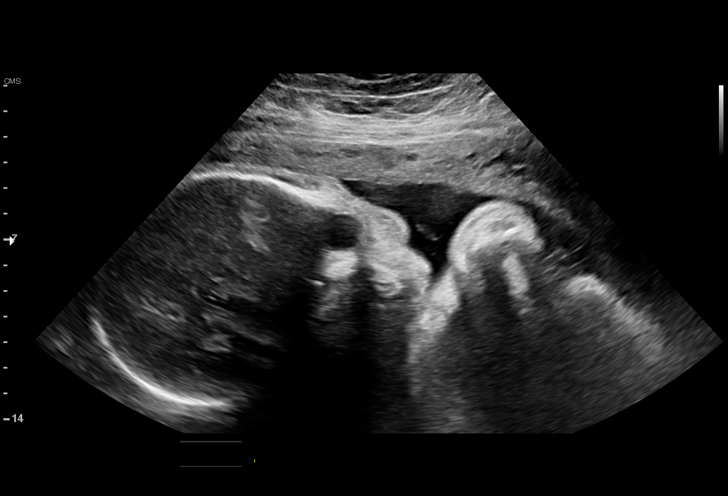
[im 14/20]
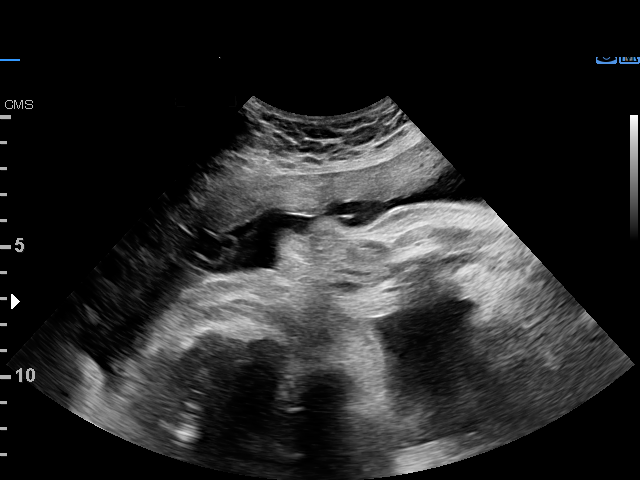
[im 16/20]
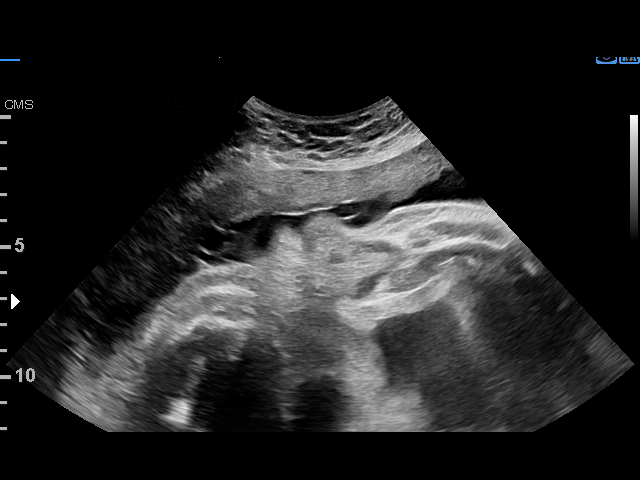
[im 17/20]
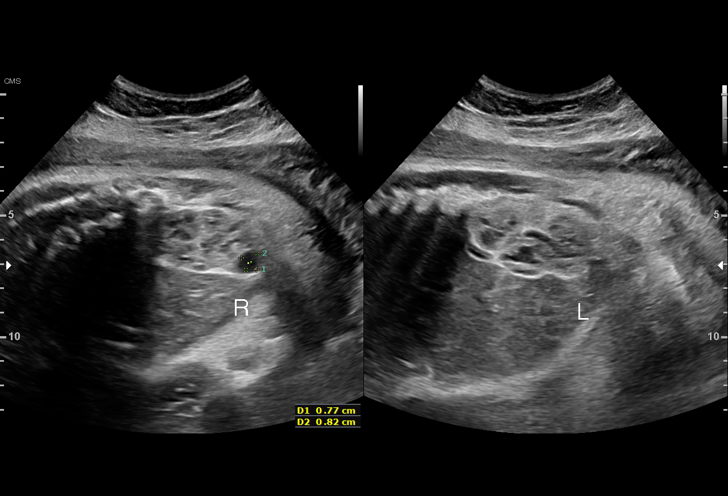
[im 18/20]
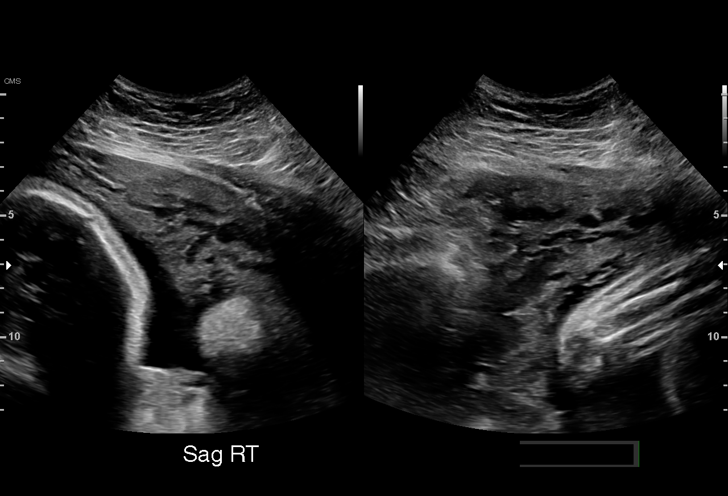
[im 20/20]
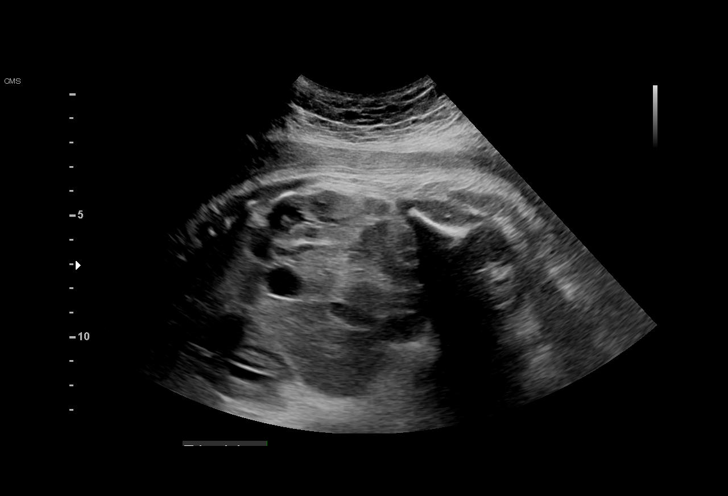

[14 of 20 positions shown; findings below may reference images not displayed]

Indications

 36 weeks gestation of pregnancy
 Encounter for other antenatal screening
 follow-up
 Poor obstetric history: Previous
 preeclampsia / eclampsia/gestational HTN
 Poor obstetric history: Previous gestational
 diabetes
 Advanced maternal age multigravida 35+,
 second trimester
 History of cesarean delivery, currently
 pregnant
 Poor obstetric history: Previous preterm
 delivery, antepartum (33 wks, c-section, due
 to preeclampsia)
 Obesity complicating pregnancy, second
 trimester
Fetal Evaluation

 Num Of Fetuses:         1
 Fetal Heart Rate(bpm):  153
 Cardiac Activity:       Observed
 Presentation:           Transverse, head to maternal right

 Amniotic Fluid
 AFI FV:      Within normal limits

 AFI Sum(cm)     %Tile       Largest Pocket(cm)
 13.9            51

 RUQ(cm)       RLQ(cm)       LUQ(cm)        LLQ(cm)

Biophysical Evaluation

 Amniotic F.V:   Pocket => 2 cm             F. Tone:        Observed
 F. Movement:    Observed                   Score:          [DATE]
 F. Breathing:   Observed
Biometry

 LV:        2.9  mm
OB History

 Blood Type:   B+
 Gravidity:    10        Term:   5        Prem:   1        SAB:   3
 TOP:          0       Ectopic:  0        Living: 6
Gestational Age

 LMP:           36w 2d        Date:  07/18/19                 EDD:   04/23/20
 Best:          36w 2d     Det. By:  LMP  (07/18/19)          EDD:   04/23/20
Anatomy

 Ventricles:            Appears normal         Kidneys:                Abnormal, see
                                                                       comments
 Diaphragm:             Appears normal         Bladder:                Appears normal
 Stomach:               Appears normal, left
                        sided
Cervix Uterus Adnexa

 Cervix
 Not visualized (advanced GA >13wks)

 Uterus
 No abnormality visualized.

 Right Ovary
 Not visualized.

 Left Ovary
 Not visualized.

 Cul De Sac
 No free fluid seen.

 Adnexa
 No free fluid.
Impression

 Patient with chronic hypertension returned for antenatal
 testing. She was evaluated at the [facility] for
 increased blood pressures. Labs including liver enzymes,
 creatinine and platelets were normal. Patient was advised to
 increase labetalol to 200 mg tid. She reports intermittent
 increase in blood pressures and symptoms of nausea. She
 feels well now. BP at our office today is 126/85 mm Hg.
 Amniotic fluid is normal and good fetal activity is seen.
 Antenatal testing is reassuring. BPP [DATE]. Cephalic
 presentation. Solitary right renal cyst (lower pole) is seen.
 I reassured the patient of the findings.
 Chronic hypertension that is not well controlled and with
 symptoms poses risks to the mother and the likelihood of
 superimposed preeclampsia is high. Delivery at 37 or 38
 weeks is likely to prevent maternal complications. Delivery at
 37/38 weeks as opposed to 39 weeks carries a small
 increased risk of respiratory-distress syndrome and NICU
 admission in the newborn.
 Maternal risks seem to outweigh newborn benefits. I
 recommended delivery at 37 to 38 weeks.
 She decided to undergo induction of labor next week. She will
 be calling your office to have her induction scheduled.
Recommendations

 -Delivery next week.
 -Postnatal evaluation of newborn's kidneys (ultrasound).
                 [place], [place]

## 2020-06-25 ENCOUNTER — Encounter: Payer: Self-pay | Admitting: Family Medicine

## 2020-06-25 ENCOUNTER — Ambulatory Visit: Payer: Medicaid Other | Admitting: Family Medicine

## 2020-06-25 NOTE — Progress Notes (Signed)
Patient did not keep appointment today. She may call to reschedule.  

## 2020-08-01 ENCOUNTER — Other Ambulatory Visit: Payer: Self-pay | Admitting: Family Medicine

## 2020-08-02 MED ORDER — BUPROPION HCL 100 MG PO TABS: 100.0000 mg | ORAL_TABLET | Freq: Two times a day (BID) | ORAL | 2 refills | Status: DC

## 2020-08-02 MED ORDER — FUROSEMIDE 40 MG PO TABS: 40.0000 mg | ORAL_TABLET | Freq: Two times a day (BID) | ORAL | 1 refills | Status: DC

## 2020-08-02 MED ORDER — FUROSEMIDE 40 MG PO TABS
40.0000 mg | ORAL_TABLET | Freq: Two times a day (BID) | ORAL | 1 refills | Status: DC
Start: 2020-08-02 — End: 2022-03-04

## 2020-08-02 NOTE — Addendum Note (Signed)
Addended by: Reva Bores on: 08/02/2020 10:54 AM   Modules accepted: Orders

## 2020-08-02 NOTE — Addendum Note (Signed)
Addended by: Allen Basista S on: 08/02/2020 05:08 PM   Modules accepted: Orders  

## 2020-11-06 ENCOUNTER — Ambulatory Visit: Payer: Self-pay | Admitting: *Deleted

## 2020-11-06 NOTE — Telephone Encounter (Signed)
Pt called in c/o chest pain that started a week ago intermittently that is now constant.  "The pain is always there but it's stronger and weaker but becoming more stronger".  (Pt was in a store while talking with me).    She has a cardiology follow up on Feb. 4, 2022.   She thinks it's anxiety but not sure.   She has anxiety at times when I asked her.   "I've never had chest pain like this with anxiety".   I have referred her to the ED.  Due to the covid pandemic and long wait times she may go to the urgent care instead.   I let her know that would be better than not being evaluated at all.   She was agreeable to going to the urgent care or ED.  I sent my notes to [facility] for Dr. Sherrie Mustache.  Reason for Disposition . [1] Chest pain (or "angina") comes and goes AND [2] is happening more often (increasing in frequency) or getting worse (increasing in severity) (Exception: chest pains that last only a few seconds)  Answer Assessment - Initial Assessment Questions 1. LOCATION: "Where does it hurt?"       A week I'm having mild chest pain.    It's coming and going. 2. RADIATION: "Does the pain go anywhere else?" (e.g., into neck, jaw, arms, back)     (Pt in store while I was talking with her).  The pain is under my left breast and into my armpit. 3. ONSET: "When did the chest pain begin?" (Minutes, hours or days)      No illness 4. PATTERN "Does the pain come and go, or has it been constant since it started?"  "Does it get worse with exertion?"      Intermittent. 5. DURATION: "How long does it last" (e.g., seconds, minutes, hours)     For a week 6. SEVERITY: "How bad is the pain?"  (e.g., Scale 1-10; mild, moderate, or severe)    - MILD (1-3): doesn't interfere with normal activities     - MODERATE (4-7): interferes with normal activities or awakens from sleep    - SEVERE (8-10): excruciating pain, unable to do any normal activities       I have anxiety I'm wondering if it's  anxiety. I have a cardiology on 11/22/2020 for a heart check. The pain is going and going but always there.   7. CARDIAC RISK FACTORS: "Do you have any history of heart problems or risk factors for heart disease?" (e.g., angina, prior heart attack; diabetes, high blood pressure, high cholesterol, smoker, or strong family history of heart disease)     I went to a cardiologist and everything was fine.   I have a follow up.   They couldn't find anything but my cardiac enzymes went up.     8. PULMONARY RISK FACTORS: "Do you have any history of lung disease?"  (e.g., blood clots in lung, asthma, emphysema, birth control pills)     No 9. CAUSE: "What do you think is causing the chest pain?"     Maybe anxiety 10. OTHER SYMPTOMS: "Do you have any other symptoms?" (e.g., dizziness, nausea, vomiting, sweating, fever, difficulty breathing, cough)       Denies shortness of breath, dizziness, feeling lightheaded. 11. PREGNANCY: "Is there any chance you are pregnant?" "When was your last menstrual period?"       Not asked  Protocols used: CHEST PAIN-A-AH

## 2020-11-22 ENCOUNTER — Ambulatory Visit: Payer: Medicaid Other | Admitting: Internal Medicine

## 2020-11-25 ENCOUNTER — Ambulatory Visit (INDEPENDENT_AMBULATORY_CARE_PROVIDER_SITE_OTHER): Payer: Medicaid Other | Admitting: Internal Medicine

## 2020-11-25 ENCOUNTER — Encounter: Payer: Self-pay | Admitting: Internal Medicine

## 2020-11-25 ENCOUNTER — Other Ambulatory Visit: Payer: Self-pay

## 2020-11-25 VITALS — BP 140/80 | HR 67 | Ht 73.0 in

## 2020-11-25 DIAGNOSIS — R079 Chest pain, unspecified: Secondary | ICD-10-CM | POA: Diagnosis not present

## 2020-11-25 DIAGNOSIS — Z8632 Personal history of gestational diabetes: Secondary | ICD-10-CM | POA: Diagnosis not present

## 2020-11-25 DIAGNOSIS — O169 Unspecified maternal hypertension, unspecified trimester: Secondary | ICD-10-CM | POA: Diagnosis not present

## 2020-11-25 DIAGNOSIS — I10 Essential (primary) hypertension: Secondary | ICD-10-CM | POA: Diagnosis not present

## 2020-11-25 MED ORDER — LABETALOL HCL 100 MG PO TABS: 100.0000 mg | ORAL_TABLET | Freq: Two times a day (BID) | ORAL | 11 refills | Status: DC

## 2020-11-25 NOTE — Patient Instructions (Addendum)
Medication Instructions:  STARTING LABETALOL 100mg  TWICE DAILY  *If you need a refill on your cardiac medications before your next appointment, please call your pharmacy*  Lab Work: BLOOD WORK TODAY  If you have labs (blood work) drawn today and your tests are completely normal, you will receive your results only by: MyChart Message (if you have MyChart) OR . A paper copy in the mail If you have any lab test that is abnormal or we need to change your treatment, we will call you to review the results.  Follow-Up: At [facility], you and your health needs are our priority.  As part of our continuing mission to provide you with exceptional heart care, we have created designated Provider Care Teams.  These Care Teams include your primary Cardiologist (physician) and Advanced Practice Providers (APPs -  Physician Assistants and Nurse Practitioners) who all work together to provide you with the care you need, when you need it.  Your next appointment:   2 month(s)  The format for your next appointment:   In Person  Provider:   [facility], MD

## 2020-11-25 NOTE — Progress Notes (Unsigned)
Cardiology Office Note:    Date:  11/25/2020   ID:  [name], DOB Oct 06, 1981, MRN [id number]  PCP:  Malva Limes, MD  Cardiologist:  Parke Poisson, MD  Electrophysiologist:  None   Referring MD: Malva Limes, MD   Chief Complaint/Reason for Referral: Chest pain  History of Present Illness:    [name] is a 40 y.o. female with a history of HTN, CP, and multiple prior pregnancies. Last pregnancy in 2021, 6 children who are healthy.   Severe chest pain 2 weeks ago. Minutes at a time and then intensity decreased. She began to drive to the ED. Got better and lessened and didn't go into ED. Finally 4 days ago went away.  We did a CCTA last year which was normal and echo grossly normal.   She snores and will need a sleep study. No evidence of RV dysfunction or PHTN on echo last year.   Past Medical History:  Diagnosis Date  . Allergic rhinitis 09/06/2015  . Anginal pain (HCC)   . Anxiety   . Deaf, left   . Excess, menstruation 09/06/2015  . Gestational diabetes 2008    glyburide this pregnancy  . History of chicken pox   . History of COVID-19 03/11/2020   April 2021  . History of gestational diabetes 04/16/2016   Early a1c neg  . Hydronephrosis, right 11/08/2012  . Insomnia 09/06/2015  . Kidney stones 2013   13 passed  . Nephrolithiasis 11/29/2012  . Pregnancy induced hypertension    labetolol  . Preterm labor    delivery    Past Surgical History:  Procedure Laterality Date  . CESAREAN SECTION  2006  . WISDOM TOOTH EXTRACTION     x4    Current Medications: Current Meds  Medication Sig  . buPROPion (WELLBUTRIN) 100 MG tablet Take 1 tablet (100 mg total) by mouth 2 (two) times daily.  . furosemide (LASIX) 40 MG tablet Take 1 tablet (40 mg total) by mouth 2 (two) times daily. (Patient taking differently: Take 40 mg by mouth daily as needed.)  . Multiple Vitamins-Minerals (MULTIVITAMIN WITH MINERALS) tablet Take 1 tablet by mouth daily.      Allergies:   Ivp dye [iodinated diagnostic agents], Iodine, and Phenergan fortis [promethazine]   Social History   Tobacco Use  . Smoking status: Never Smoker  . Smokeless tobacco: Never Used  Vaping Use  . Vaping Use: Never used  Substance Use Topics  . Alcohol use: No    Alcohol/week: 0.0 standard drinks  . Drug use: No     Family History: The patient's family history includes Allergies in her sister; Anxiety disorder in her mother; Atrial fibrillation in her mother and paternal grandfather; Depression in her mother; Diabetes in her paternal grandfather; Heart attack in her maternal grandfather and paternal grandfather; Mental illness in her father; Schizophrenia in her maternal grandmother.  ROS:   Please see the history of present illness.    All other systems reviewed and are negative.  EKGs/Labs/Other Studies Reviewed:    The following studies were reviewed today:  EKG:  NSR, ST T wave abnormality anteriorly - grossly unchanged.  Recent Labs: 03/31/2020: ALT 12; BUN 11; Creatinine, Ser 0.74; Potassium 3.9; Sodium 136 04/01/2020: Hemoglobin 8.6; Platelets 203  Recent Lipid Panel    Component Value Date/Time   CHOL 126 04/18/2019 0937   TRIG 80 04/18/2019 0937   HDL 38 (L) 04/18/2019 0937   CHOLHDL 3.3 04/18/2019 3710  VLDL 16 04/18/2019 0937   LDLCALC 72 04/18/2019 0937    Physical Exam:    VS:  BP 140/80 (BP Location: Left Arm, Patient Position: Sitting, Cuff Size: Large)   Pulse 67   Ht 6\' 1"  (1.854 m)   BMI 41.43 kg/m     Wt Readings from Last 5 Encounters:  05/01/20 (!) 314 lb (142.4 kg)  03/31/20 (!) 338 lb 4.8 oz (153.5 kg)  03/22/20 (!) 334 lb (151.5 kg)  03/11/20 (!) 334 lb (151.5 kg)  03/04/20 (!) 329 lb (149.2 kg)    Constitutional: No acute distress Eyes: sclera non-icteric, normal conjunctiva and lids ENMT: normal dentition, moist mucous membranes Cardiovascular: regular rhythm, normal rate, no murmurs. S1 and S2 normal. Radial pulses  normal bilaterally. No jugular venous distention.  Respiratory: clear to auscultation bilaterally GI : normal bowel sounds, soft and nontender. No distention.   MSK: extremities warm, well perfused. No edema.  NEURO: grossly nonfocal exam, moves all extremities. PSYCH: alert and oriented x 3, normal mood and affect.   ASSESSMENT:    1. Chest pain of uncertain etiology   2. Essential hypertension   3. Hypertension during pregnancy, antepartum, unspecified hypertension in pregnancy type   4. History of gestational diabetes    PLAN:    Chest pain of uncertain etiology - Plan: EKG 12-Lead - no structural CV issues from workup last year.  - will reassess for anemia with CBC and iron studies. Will get D-dimer to risk stratify since she did not present to ED.  - will consider echocardiogram for pericarditis if chest pain persists. No pleuritic symptoms.   Patient requesting TSH, will perform.   HTN - BP elevated, not on therapy. She does not have a method of birth control and does not attempt to prevent pregnancy. She is of child bearing age - will use labetalol for HTN since this would be safe in pregnancy and she has used this before. Labetalol 100mg  BID  Total time of encounter: 30 minutes total time of encounter, including 20 minutes spent in face-to-face patient care on the date of this encounter. This time includes coordination of care and counseling regarding above mentioned problem list. Remainder of non-face-to-face time involved reviewing chart documents/testing relevant to the patient encounter and documentation in the medical record. I have independently reviewed documentation from referring provider.   03/06/20, MD Grant  [facility]    Medication Adjustments/Labs and Tests Ordered: Current medicines are reviewed at length with the patient today.  Concerns regarding medicines are outlined above.   Orders Placed This Encounter  Procedures  . EKG 12-Lead    Meds ordered this encounter  Medications  . labetalol (NORMODYNE) 100 MG tablet    Sig: Take 1 tablet (100 mg total) by mouth 2 (two) times daily.    Dispense:  60 tablet    Refill:  11    Patient Instructions  Medication Instructions:  STARTING LABETALOL 100mg  TWICE DAILY  *If you need a refill on your cardiac medications before your next appointment, please call your pharmacy*  Lab Work: BLOOD WORK TODAY  If you have labs (blood work) drawn today and your tests are completely normal, you will receive your results only by: MyChart Message (if you have MyChart) OR . A paper copy in the mail If you have any lab test that is abnormal or we need to change your treatment, we will call you to review the results.  Follow-Up: At [facility], you and  your health needs are our priority.  As part of our continuing mission to provide you with exceptional heart care, we have created designated Provider Care Teams.  These Care Teams include your primary Cardiologist (physician) and Advanced Practice Providers (APPs -  Physician Assistants and Nurse Practitioners) who all work together to provide you with the care you need, when you need it.  Your next appointment:   2 month(s)  The format for your next appointment:   In Person  Provider:   Weston Brass, MD

## 2020-11-26 ENCOUNTER — Other Ambulatory Visit: Payer: Self-pay

## 2020-11-26 ENCOUNTER — Emergency Department (HOSPITAL_COMMUNITY): Payer: Medicaid Other

## 2020-11-26 ENCOUNTER — Telehealth: Payer: Self-pay

## 2020-11-26 ENCOUNTER — Emergency Department (HOSPITAL_COMMUNITY)
Admission: EM | Admit: 2020-11-26 | Discharge: 2020-11-26 | Disposition: A | Discharge status: Home / Self Care | Payer: Medicaid Other | Attending: Emergency Medicine | Admitting: Emergency Medicine

## 2020-11-26 ENCOUNTER — Ambulatory Visit (HOSPITAL_COMMUNITY)
Admission: RE | Admit: 2020-11-26 | Discharge: 2020-11-26 | Disposition: A | Discharge status: Home / Self Care | Payer: Medicaid Other | Source: Ambulatory Visit | Attending: Cardiovascular Disease | Admitting: Cardiovascular Disease

## 2020-11-26 ENCOUNTER — Encounter (HOSPITAL_COMMUNITY): Payer: Self-pay | Admitting: *Deleted

## 2020-11-26 DIAGNOSIS — R079 Chest pain, unspecified: Secondary | ICD-10-CM | POA: Diagnosis not present

## 2020-11-26 DIAGNOSIS — R0789 Other chest pain: Secondary | ICD-10-CM

## 2020-11-26 DIAGNOSIS — Z8616 Personal history of COVID-19: Secondary | ICD-10-CM | POA: Insufficient documentation

## 2020-11-26 DIAGNOSIS — R7989 Other specified abnormal findings of blood chemistry: Secondary | ICD-10-CM

## 2020-11-26 DIAGNOSIS — R42 Dizziness and giddiness: Secondary | ICD-10-CM | POA: Diagnosis not present

## 2020-11-26 DIAGNOSIS — U071 COVID-19: Secondary | ICD-10-CM | POA: Insufficient documentation

## 2020-11-26 DIAGNOSIS — R0602 Shortness of breath: Secondary | ICD-10-CM | POA: Diagnosis not present

## 2020-11-26 LAB — IRON,TIBC AND FERRITIN PANEL
Ferritin: 8 ng/mL — ABNORMAL LOW (ref 15–150)
Iron Saturation: 7 % — CL (ref 15–55)
Iron: 27 ug/dL (ref 27–159)
Total Iron Binding Capacity: 405 ug/dL (ref 250–450)
UIBC: 378 ug/dL (ref 131–425)

## 2020-11-26 LAB — CBC
HCT: 37.9 % (ref 36.0–46.0)
Hemoglobin: 11.3 g/dL — ABNORMAL LOW (ref 12.0–15.0)
MCH: 24.2 pg — ABNORMAL LOW (ref 26.0–34.0)
MCHC: 29.8 g/dL — ABNORMAL LOW (ref 30.0–36.0)
MCV: 81.2 fL (ref 80.0–100.0)
Platelets: 281 10*3/uL (ref 150–400)
RBC: 4.67 MIL/uL (ref 3.87–5.11)
RDW: 13.5 % (ref 11.5–15.5)
WBC: 7.5 10*3/uL (ref 4.0–10.5)
nRBC: 0 % (ref 0.0–0.2)

## 2020-11-26 LAB — RESP PANEL BY RT-PCR (FLU A&B, COVID) ARPGX2
Influenza A by PCR: NEGATIVE
Influenza B by PCR: NEGATIVE
SARS Coronavirus 2 by RT PCR: POSITIVE — AB

## 2020-11-26 LAB — BASIC METABOLIC PANEL
Anion gap: 10 (ref 5–15)
BUN: 12 mg/dL (ref 6–20)
CO2: 26 mmol/L (ref 22–32)
Calcium: 9.1 mg/dL (ref 8.9–10.3)
Chloride: 104 mmol/L (ref 98–111)
Creatinine, Ser: 0.95 mg/dL (ref 0.44–1.00)
GFR, Estimated: >60 mL/min (ref >60)
Glucose, Bld: 103 mg/dL — ABNORMAL HIGH (ref 70–99)
Potassium: 4 mmol/L (ref 3.5–5.1)
Sodium: 140 mmol/L (ref 135–145)

## 2020-11-26 LAB — CBC WITH DIFFERENTIAL/PLATELET
Basophils Absolute: 0 10*3/uL (ref 0.0–0.2)
Basos: 1 %
EOS (ABSOLUTE): 0.1 10*3/uL (ref 0.0–0.4)
Eos: 1 %
Hematocrit: 36.3 % (ref 34.0–46.6)
Hemoglobin: 11.7 g/dL (ref 11.1–15.9)
Immature Grans (Abs): 0 10*3/uL (ref 0.0–0.1)
Immature Granulocytes: 0 %
Lymphocytes Absolute: 1.8 10*3/uL (ref 0.7–3.1)
Lymphs: 31 %
MCH: 24.5 pg — ABNORMAL LOW (ref 26.6–33.0)
MCHC: 32.2 g/dL (ref 31.5–35.7)
MCV: 76 fL — ABNORMAL LOW (ref 79–97)
Monocytes Absolute: 0.3 10*3/uL (ref 0.1–0.9)
Monocytes: 5 %
Neutrophils Absolute: 3.6 10*3/uL (ref 1.4–7.0)
Neutrophils: 62 %
Platelets: 308 10*3/uL (ref 150–450)
RBC: 4.77 x10E6/uL (ref 3.77–5.28)
RDW: 13.5 % (ref 11.7–15.4)
WBC: 5.8 10*3/uL (ref 3.4–10.8)

## 2020-11-26 LAB — D-DIMER, QUANTITATIVE: D-DIMER: 1.04 mg/L FEU — ABNORMAL HIGH (ref 0.00–0.49)

## 2020-11-26 LAB — TROPONIN I (HIGH SENSITIVITY)
Troponin I (High Sensitivity): 37 ng/L — ABNORMAL HIGH (ref <18)
Troponin I (High Sensitivity): 37 ng/L — ABNORMAL HIGH (ref <18)

## 2020-11-26 LAB — I-STAT BETA HCG BLOOD, ED (MC, WL, AP ONLY): I-stat hCG, quantitative: <5 m[IU]/mL (ref <5)

## 2020-11-26 LAB — TSH: TSH: 0.781 u[IU]/mL (ref 0.450–4.500)

## 2020-11-26 MED ORDER — FERROUS SULFATE 325 (65 FE) MG PO TABS: 325.0000 mg | ORAL_TABLET | Freq: Two times a day (BID) | ORAL | 3 refills | Status: DC

## 2020-11-26 MED ORDER — TECHNETIUM TO 99M ALBUMIN AGGREGATED
4.4500 | Freq: Once | INTRAVENOUS | Status: AC | PRN
  Administered 2020-11-26: 4.45 via INTRAVENOUS

## 2020-11-26 NOTE — Discharge Instructions (Signed)
Please return for any problem.  °

## 2020-11-26 NOTE — ED Provider Notes (Signed)
[facility] EMERGENCY DEPARTMENT Provider Note   CSN: [id number] Arrival date & time: 11/26/20  1543     History Chief Complaint  Patient presents with  . Chest Pain  . Shortness of Breath    [name] is a 40 y.o. female.  40 year old female with prior medical history as detailed below presents for evaluation of reported shortness of breath.  Patient reports ongoing symptoms for the last roughly 2 weeks.  Patient was seen by cardiology yesterday.  Patient reports that she had an elevated D-dimer and was instructed to come to the ED for further work-up of possible PE.  Patient denies fever.  Patient denies cough.  Patient reports that she had a mild Covid infection approximately 1 year ago.  She denies receiving Covid vaccination.  Patient denies prior history of DVT or PE.  Patient reports cardiology work-up approximately 12 months ago for reported chest discomfort.  Patient was found to have a anatomically normal heart.  The history is provided by the patient and medical records.  Shortness of Breath Severity:  Mild Onset quality:  Gradual Duration:  2 weeks Timing:  Intermittent Progression:  Waxing and waning Chronicity:  New Relieved by:  Nothing Worsened by:  Nothing      Past Medical History:  Diagnosis Date  . Allergic rhinitis 09/06/2015  . Anginal pain (HCC)   . Anxiety   . Deaf, left   . Excess, menstruation 09/06/2015  . Gestational diabetes 2008    glyburide this pregnancy  . History of chicken pox   . History of COVID-19 03/11/2020   April 2021  . History of gestational diabetes 04/16/2016   Early a1c neg  . Hydronephrosis, right 11/08/2012  . Insomnia 09/06/2015  . Kidney stones 2013   13 passed  . Nephrolithiasis 11/29/2012  . Pregnancy induced hypertension    labetolol  . Preterm labor    delivery    Patient Active Problem List   Diagnosis Date Noted  . History of cesarean section 04/01/2020  . BMI 40.0-44.9, adult  (HCC) 12/19/2019  . Depression, major, single episode 09/27/2018  . History of gestational diabetes 04/16/2016  . Episodic paroxysmal anxiety disorder 09/06/2015  . Vitamin D insufficiency 11/30/2014  . Chronic fatigue 11/27/2014  . History of VBAC 11/27/2014  . Benign essential hypertension 04/28/2013  . Anxiety with flying   . GERD 12/23/2007  . Acquired cyst of kidney 12/23/2007    Past Surgical History:  Procedure Laterality Date  . CESAREAN SECTION  2006  . WISDOM TOOTH EXTRACTION     x4     OB History    Gravida  10   Para  7   Term  6   Preterm  1   AB  3   Living  7     SAB  3   IAB      Ectopic      Multiple  0   Live Births  7           Family History  Problem Relation Age of Onset  . Schizophrenia Maternal Grandmother   . Atrial fibrillation Mother   . Depression Mother   . Anxiety disorder Mother   . Mental illness Father   . Heart attack Maternal Grandfather   . Atrial fibrillation Paternal Grandfather   . Diabetes Paternal Grandfather   . Heart attack Paternal Grandfather   . Allergies Sister     Social History   Tobacco Use  .  Smoking status: Never Smoker  . Smokeless tobacco: Never Used  Vaping Use  . Vaping Use: Never used  Substance Use Topics  . Alcohol use: No    Alcohol/week: 0.0 standard drinks  . Drug use: No    Home Medications Prior to Admission medications   Medication Sig Start Date End Date Taking? Authorizing Provider  ascorbic acid (VITAMIN C) 500 MG tablet Take 500-1,000 mg by mouth daily.   Yes [provider]  furosemide (LASIX) 40 MG tablet Take 1 tablet (40 mg total) by mouth 2 (two) times daily. Patient taking differently: Take 40 mg by mouth daily as needed for fluid or edema. 08/02/20  Yes Reva Bores, MD  Multiple Vitamins-Calcium (ONE-A-DAY WOMENS FORMULA) TABS Take 1 tablet by mouth daily with breakfast.   Yes [provider]  buPROPion (WELLBUTRIN) 100 MG tablet Take 1  tablet (100 mg total) by mouth 2 (two) times daily. Patient not taking: No sig reported 08/02/20   Reva Bores, MD  ferrous sulfate 325 (65 FE) MG tablet Take 1 tablet (325 mg total) by mouth 2 (two) times daily with a meal. 11/26/20   Parke Poisson, MD  labetalol (NORMODYNE) 100 MG tablet Take 1 tablet (100 mg total) by mouth 2 (two) times daily. 11/25/20   Parke Poisson, MD    Allergies    Iodine, Ivp dye [iodinated diagnostic agents], and Phenergan fortis [promethazine]  Review of Systems   Review of Systems  Respiratory: Positive for shortness of breath.   All other systems reviewed and are negative.   Physical Exam Updated Vital Signs BP (!) 152/92   Pulse 87   Temp 98.2 F (36.8 C) (Oral)   Resp (!) 21   Ht 6\' 1"  (1.854 m)   Wt (!) 145.2 kg   LMP 11/26/2020   SpO2 99%   BMI 42.22 kg/m   Physical Exam Vitals and nursing note reviewed.  Constitutional:      General: She is not in acute distress.    Appearance: She is well-developed and well-nourished.  HENT:     Head: Normocephalic and atraumatic.     Mouth/Throat:     Mouth: Oropharynx is clear and moist.  Eyes:     Extraocular Movements: EOM normal.     Conjunctiva/sclera: Conjunctivae normal.     Pupils: Pupils are equal, round, and reactive to light.  Cardiovascular:     Rate and Rhythm: Normal rate and regular rhythm.     Heart sounds: Normal heart sounds.  Pulmonary:     Effort: Pulmonary effort is normal. No respiratory distress.     Breath sounds: Normal breath sounds.  Abdominal:     General: There is no distension.     Palpations: Abdomen is soft.     Tenderness: There is no abdominal tenderness.  Musculoskeletal:        General: No deformity or edema. Normal range of motion.     Cervical back: Normal range of motion and neck supple.  Skin:    General: Skin is warm and dry.  Neurological:     General: No focal deficit present.     Mental Status: She is alert and oriented to person,  place, and time.  Psychiatric:        Mood and Affect: Mood and affect normal.     ED Results / Procedures / Treatments   Labs (all labs ordered are listed, but only abnormal results are displayed) Labs Reviewed  RESP PANEL  BY RT-PCR (FLU A&B, COVID) ARPGX2 - Abnormal; Notable for the following components:      Result Value   SARS Coronavirus 2 by RT PCR POSITIVE (*)    All other components within normal limits  BASIC METABOLIC PANEL - Abnormal; Notable for the following components:   Glucose, Bld 103 (*)    All other components within normal limits  CBC - Abnormal; Notable for the following components:   Hemoglobin 11.3 (*)    MCH 24.2 (*)    MCHC 29.8 (*)    All other components within normal limits  TROPONIN I (HIGH SENSITIVITY) - Abnormal; Notable for the following components:   Troponin I (High Sensitivity) 37 (*)    All other components within normal limits  TROPONIN I (HIGH SENSITIVITY) - Abnormal; Notable for the following components:   Troponin I (High Sensitivity) 37 (*)    All other components within normal limits  I-STAT BETA HCG BLOOD, ED ([facility], [facility], [facility] ONLY)    EKG EKG Interpretation  Date/Time:  Tuesday November 26 2020 16:10:13 EST Ventricular Rate:  98 PR Interval:  142 QRS Duration: 92 QT Interval:  338 QTC Calculation: 431 R Axis:   64 Text Interpretation: Normal sinus rhythm Incomplete right bundle branch block Nonspecific ST abnormality Abnormal ECG Confirmed by Kristine Royal [phone]) on 11/26/2020 5:21:14 PM   Radiology NM Pulmonary Perfusion  Result Date: 11/26/2020 CLINICAL DATA:  Positive D-dimer, chest pain EXAM: NUCLEAR MEDICINE PERFUSION LUNG SCAN TECHNIQUE: Perfusion images were obtained in multiple projections after intravenous injection of radiopharmaceutical. Ventilation scans intentionally deferred if perfusion scan and chest x-ray adequate for interpretation during COVID 19 epidemic. RADIOPHARMACEUTICALS:  4.45 mCi Tc-53m MAA IV COMPARISON:   Chest x-ray today FINDINGS: No perfusion defects to suggest pulmonary embolus. IMPRESSION: No evidence of pulmonary embolus. Electronically Signed   By: Charlett Nose M.D.   On: 11/26/2020 20:13   DG Chest Port 1 View  Result Date: 11/26/2020 CLINICAL DATA:  Left-sided chest pain radiating to left arm, short of breath, dizziness for several days EXAM: PORTABLE CHEST 1 VIEW COMPARISON:  04/17/2019 FINDINGS: The heart size and mediastinal contours are within normal limits. Both lungs are clear. The visualized skeletal structures are unremarkable. IMPRESSION: No active disease. Electronically Signed   By: Sharlet Salina M.D.   On: 11/26/2020 18:33   VAS [place] LOWER EXTREMITY VENOUS (DVT)  Result Date: 11/26/2020  Lower Venous DVT Study Indications: Patient presents with chest pain for about 2 weeks and recent lab work with elevated D-dimer. Unable to have contrast dye, evaluate for DVT. Patient denies leg pain or swelling at this time.  Risk Factors: Obesity. Performing Technologist: Olegario Shearer RVT  Examination Guidelines: A complete evaluation includes B-mode imaging, spectral Doppler, color Doppler, and power Doppler as needed of all accessible portions of each vessel. Bilateral testing is considered an integral part of a complete examination. Limited examinations for reoccurring indications may be performed as noted. The reflux portion of the exam is performed with the patient in reverse Trendelenburg.  +---------+---------------+---------+-----------+----------+--------------+ RIGHT    CompressibilityPhasicitySpontaneityPropertiesThrombus Aging +---------+---------------+---------+-----------+----------+--------------+ CFV      Full           Yes      Yes                                 +---------+---------------+---------+-----------+----------+--------------+ SFJ      Full  Yes      Yes                                  +---------+---------------+---------+-----------+----------+--------------+ FV Prox  Full           Yes      Yes                                 +---------+---------------+---------+-----------+----------+--------------+ FV Mid   Full           Yes      Yes                                 +---------+---------------+---------+-----------+----------+--------------+ FV DistalFull           Yes      Yes                                 +---------+---------------+---------+-----------+----------+--------------+ PFV      Full                                                        +---------+---------------+---------+-----------+----------+--------------+ POP      Full           Yes      Yes                                 +---------+---------------+---------+-----------+----------+--------------+ PTV      Full           Yes      Yes                                 +---------+---------------+---------+-----------+----------+--------------+ PERO     Full           Yes      Yes                                 +---------+---------------+---------+-----------+----------+--------------+ Gastroc  Full                                                        +---------+---------------+---------+-----------+----------+--------------+ GSV      Full           Yes      Yes                                 +---------+---------------+---------+-----------+----------+--------------+   +---------+---------------+---------+-----------+----------+--------------+ LEFT     CompressibilityPhasicitySpontaneityPropertiesThrombus Aging +---------+---------------+---------+-----------+----------+--------------+ CFV      Full           Yes      Yes                                 +---------+---------------+---------+-----------+----------+--------------+  SFJ      Full           Yes      Yes                                  +---------+---------------+---------+-----------+----------+--------------+ FV Prox  Full           Yes      Yes                                 +---------+---------------+---------+-----------+----------+--------------+ FV Mid   Full           Yes      Yes                                 +---------+---------------+---------+-----------+----------+--------------+ FV DistalFull           Yes      Yes                                 +---------+---------------+---------+-----------+----------+--------------+ PFV      Full                                                        +---------+---------------+---------+-----------+----------+--------------+ POP      Full           Yes      Yes                                 +---------+---------------+---------+-----------+----------+--------------+ PTV      Full           Yes      Yes                                 +---------+---------------+---------+-----------+----------+--------------+ PERO     Full           Yes      Yes                                 +---------+---------------+---------+-----------+----------+--------------+ Gastroc  Full                                                        +---------+---------------+---------+-----------+----------+--------------+ GSV      Full           Yes      Yes                                 +---------+---------------+---------+-----------+----------+--------------+     Summary: RIGHT: - No evidence of deep vein thrombosis in the lower extremity. No indirect evidence of obstruction proximal to the inguinal ligament. - No cystic structure found  in the popliteal fossa.  LEFT: - No evidence of deep vein thrombosis in the lower extremity. No indirect evidence of obstruction proximal to the inguinal ligament. - No cystic structure found in the popliteal fossa.  *See table(s) above for measurements and observations.    Preliminary     Procedures Procedures    Medications Ordered in ED Medications  technetium albumin aggregated (MAA) injection solution 4.45 millicurie (4.45 millicuries Intravenous Contrast Given 11/26/20 1952)    ED Course  I have reviewed the triage vital signs and the nursing notes.  Pertinent labs & imaging results that were available during my care of the patient were reviewed by me and considered in my medical decision making (see chart for details).  Clinical Course as of 11/26/20 2214  Tue Nov 26, 2020  1759 NM PULMONARY VENT AND PERF (V/Q Scan) [PM]    Clinical Course User Index [PM] Wynetta Fines, MD   MDM Rules/Calculators/A&P                          MDM  Screen complete  Laquenta Whitsell Breed was evaluated in Emergency Department on 11/26/2020 for the symptoms described in the history of present illness. She was evaluated in the context of the global COVID-19 pandemic, which necessitated consideration that the patient might be at risk for infection with the SARS-CoV-2 virus that causes COVID-19. Institutional protocols and algorithms that pertain to the evaluation of patients at risk for COVID-19 are in a state of rapid change based on information released by regulatory bodies including the CDC and federal and state organizations. These policies and algorithms were followed during the patient's care in the ED.   Patient is presenting for evaluation of reported shortness of breath.  Patient symptoms been ongoing for the last 2 weeks.  Patient's work-up today reveals positive Covid test.  Patient without evidence of significant pulmonary infiltrates or PE.  Patient with prior work-up earlier today that did not demonstrate evidence of DVT.  Patient's EKG without evidence of acute ischemia.  Troponin x2 without significant delta.  Patient is aware of work-up findings today.  She understands need for close follow-up.  She desires discharge.  Strict return precautions given and understood. Importance of close  follow-up stressed.  Final Clinical Impression(s) / ED Diagnoses Final diagnoses:  COVID    Rx / DC Orders ED Discharge Orders    None       Wynetta Fines, MD 11/26/20 2225

## 2020-11-26 NOTE — ED Notes (Signed)
Pt given small amount of water per RN orders.

## 2020-11-26 NOTE — Telephone Encounter (Signed)
  Baird Cancer, RN  11/26/2020 12:23 PM EST Back to Top     Patient made aware of results. Patient states she is having chest pain. Advised patient that for active chest pain and elevated D-Dimer that presenting to the ED would be reccommended for her. Patient states she would not like to go to the ED at this time. Patient states she would like to treat this outpatient if possible. Spoke with Dr. Jacques Navy since patient has anaphylactic reaction to IV contrast and Per Dr. Jacques Navy- VQ scan would be most appropriate for patient. Dr. Jacques Navy also recommends patient having lower extremity venous duplex as well to rule out DVT.   Patient has been made aware, patient aware of recommendations of presenting to the ED. Orders have been placed. Patient has appointment today at [facility] office for Venous Duplex- scheduling is working on getting VQ scan scheduled. Patient also to start ferrous sulfate 325mg  BID. Patient verbalized understanding of all instructions.     Patient in office for Venous Duplex studies due to patients elevated D- Dimer as noted above.   Patient inquiring about VQ scan- advised patient that VQ scan is scheduled for this Thursday 2/10 at 12pm. Patient concerned and would not like to wait for scan. Patient states that her chest pain is worse today and patient states she had contemplated going to the ED earlier for evaluation. Advised patient that due to elevated D-Dimer and increase/worsening of chest pain it would be [place] if she report to the ED for evaluation. Patient verbalized understanding and states she will go to [facility] Emergency Room to be seen.    Will forward message to Dr. [facility] to make her aware.

## 2020-11-26 NOTE — ED Notes (Signed)
Pt seen leaving department with husband, EDP made aware, pt's IV on bedside table.

## 2020-11-26 NOTE — ED Triage Notes (Signed)
Pt reports chest pain that started approx 2 weeks ago but temporarily stopped. Pain is sharp and radiates through to her back. Pt reports sob. Went to cardiology office and had + D Dimer.

## 2020-11-26 NOTE — ED Notes (Signed)
Pt taken to nuc med for scan.

## 2020-11-27 ENCOUNTER — Telehealth: Payer: Self-pay | Admitting: *Deleted

## 2020-11-27 LAB — BASIC METABOLIC PANEL
BUN/Creatinine Ratio: 17 (ref 9–23)
BUN: 12 mg/dL (ref 6–24)
CO2: 23 mmol/L (ref 20–29)
Calcium: 8.8 mg/dL (ref 8.7–10.2)
Chloride: 107 mmol/L — ABNORMAL HIGH (ref 96–106)
Creatinine, Ser: 0.72 mg/dL (ref 0.57–1.00)
GFR calc Af Amer: 121 mL/min/{1.73_m2} (ref >59)
GFR calc non Af Amer: 105 mL/min/{1.73_m2} (ref >59)
Glucose: 111 mg/dL — ABNORMAL HIGH (ref 65–99)
Potassium: 4.1 mmol/L (ref 3.5–5.2)
Sodium: 144 mmol/L (ref 134–144)

## 2020-11-27 NOTE — Telephone Encounter (Signed)
Transition Care Management Follow-up Telephone Call  Date of discharge and from where: 11/26/2020 -   How have you been since you were released from the hospital? "Doing better, feeling dizzy at times."  Any questions or concerns? No  Items Reviewed:  Did the pt receive and understand the discharge instructions provided? Yes   Medications obtained and verified? Yes   Other? N/A  Any new allergies since your discharge? No   Dietary orders reviewed? Yes  Do you have support at home? Yes   Home Care and Equipment/Supplies: Were home health services ordered? not applicable If so, what is the name of the agency? N/A  Has the agency set up a time to come to the patient's home? not applicable Were any new equipment or medical supplies ordered?  No What is the name of the medical supply agency? N/A Were you able to get the supplies/equipment? not applicable Do you have any questions related to the use of the equipment or supplies? No  Functional Questionnaire: (I = Independent and D = Dependent) ADLs: I  Bathing/Dressing- I  Meal Prep- I  Eating- I  Maintaining continence- I  Transferring/Ambulation- I  Managing Meds- I  Follow up appointments reviewed:   PCP Hospital f/u appt confirmed? No   - Patient will call to make appointment if necessary.   Specialist Hospital f/u appt confirmed? N/A S  Are transportation arrangements needed? N/A  If their condition worsens, is the pt aware to call PCP or go to the Emergency Dept.? Yes  Was the patient provided with contact information for the PCP's office or ED? Yes  Was to pt encouraged to call back with questions or concerns? Yes

## 2020-11-27 NOTE — Telephone Encounter (Signed)
Called to discuss with patient about COVID-19 symptoms and the use of one of the available treatments for those with mild to moderate Covid symptoms and at a high risk of hospitalization.  Pt appears to qualify for outpatient treatment due to co-morbid conditions and/or a member of an at-risk group in accordance with the FDA Emergency Use Authorization.   Chart note from ED visit on 11/26/20 notes SOB.   Symptom onset:  Vaccinated: No Booster   No Immunocompromised? No Qualifiers: HTN  Unable to reach pt - Left VM to return call for information.   [name]

## 2020-11-28 ENCOUNTER — Encounter (HOSPITAL_COMMUNITY): Payer: Medicaid Other

## 2020-11-28 ENCOUNTER — Ambulatory Visit (HOSPITAL_COMMUNITY): Payer: Medicaid Other

## 2020-12-02 ENCOUNTER — Ambulatory Visit: Payer: Medicaid Other

## 2020-12-05 ENCOUNTER — Encounter: Payer: Self-pay | Admitting: Family Medicine

## 2020-12-05 DIAGNOSIS — E611 Iron deficiency: Secondary | ICD-10-CM | POA: Insufficient documentation

## 2021-08-05 ENCOUNTER — Telehealth: Payer: Self-pay | Admitting: Internal Medicine

## 2021-08-05 NOTE — Telephone Encounter (Signed)
Pt c/o medication issue:  1. Name of Medication: ferrous sulfate 325 (65 FE) MG tablet  2. How are you currently taking this medication (dosage and times per day)? Stopped taking the medicine  3. Are you having a reaction (difficulty breathing--STAT)? NO  4. What is your medication issue? PT STOPPED TAKING THIS MEDICINE IN 11/2020 WHEN IT WAS PRESCRIBED, PT STATES SHE WAS ADVISED TO CALL OUR OFFICE TO LET DR. ACHARYA KNOW HOW THE MEDICINE IS WORKING. PT IS CALLING TODAY TO LET OUR OFFICE KNOW THIS MEDICINE MADE HER HAVE DIARRHEA. PT DOES NOT HAVE MYCHART ANYMORE SHE WOULD LIKE A CALLBACK. PT STATES SHE WOULD RATHER HAVE THE IRON INFUSIONS INSTEAD OF THE PILLS

## 2021-08-05 NOTE — Telephone Encounter (Signed)
Not seen since 2019. Please advise patient she needs to schedule office visit for follow up, evaluation and treatment options for iron deficiency anemia anemia.

## 2021-08-05 NOTE — Telephone Encounter (Signed)
The patient was calling into let Dr. Jacques Navy know that she stopped taking the ferrous sulfate in February. She stated that it was giving her bad diarrhea.   She had been trying to eat a iron rich diet but feels like this is not helping. She has started to feel fatigued again. She denies any chest pain or shortness of breath. She would like to know if iron transfusions is an option or what the recommendation would be.  She has not had labs drawn anywhere recently.

## 2021-08-05 NOTE — Telephone Encounter (Signed)
Spoke with pt, aware of dr Lupe Carney recommendations. Note forwarded to PCP.

## 2021-08-05 NOTE — Telephone Encounter (Signed)
Spoke with pt, she reports that dr Jacques Navy started the ferrous sulfate. Aware will make dr Jacques Navy aware.

## 2021-08-06 NOTE — Telephone Encounter (Signed)
Pt advised.  She states she wants a referral for an iron infusion.  She is going to try and go through her OB/GYN first.  If they can't help her she will call back and schedule an appointment here.   Thanks,   -Vernona Rieger

## 2021-12-28 ENCOUNTER — Other Ambulatory Visit: Payer: Self-pay

## 2021-12-28 ENCOUNTER — Encounter: Payer: Self-pay | Admitting: Emergency Medicine

## 2021-12-28 ENCOUNTER — Ambulatory Visit
Admission: EM | Admit: 2021-12-28 | Discharge: 2021-12-28 | Disposition: A | Discharge status: Home / Self Care | Payer: Medicaid Other | Attending: Emergency Medicine | Admitting: Emergency Medicine

## 2021-12-28 DIAGNOSIS — B349 Viral infection, unspecified: Secondary | ICD-10-CM

## 2021-12-28 DIAGNOSIS — J029 Acute pharyngitis, unspecified: Secondary | ICD-10-CM

## 2021-12-28 LAB — POCT RAPID STREP A (OFFICE): Rapid Strep A Screen: NEGATIVE

## 2021-12-28 MED ORDER — LIDOCAINE VISCOUS HCL 2 % MT SOLN
15.0000 mL | OROMUCOSAL | 0 refills | Status: DC | PRN
Start: 2021-12-28 — End: 2022-03-04

## 2021-12-28 NOTE — ED Provider Notes (Signed)
?[facility] ? ? ? ?CSN: [id number] ?Arrival date & time: 12/28/21  1440 ? ? ?  ? ?History   ?Chief Complaint ?Chief Complaint  ?Patient presents with  ? Sore Throat  ? ? ?HPI ?[name] is a 41 y.o. female.  Patient presents with 2-day history of low-grade fever, sore throat, nonproductive cough.  Treatment at home with Tylenol and ibuprofen.  No rash, shortness of breath, vomiting, diarrhea, or other symptoms.  Her medical history includes hypertension, GERD, obesity, chronic fatigue. ? ?The history is provided by the patient and medical records.  ? ?Past Medical History:  ?Diagnosis Date  ? Allergic rhinitis 09/06/2015  ? Anginal pain (HCC)   ? Anxiety   ? Deaf, left   ? Excess, menstruation 09/06/2015  ? Gestational diabetes 2008   ? glyburide this pregnancy  ? History of chicken pox   ? History of COVID-19 03/11/2020  ? April 2021  ? History of gestational diabetes 04/16/2016  ? Early a1c neg  ? Hydronephrosis, right 11/08/2012  ? Insomnia 09/06/2015  ? Kidney stones 2013  ? 13 passed  ? Nephrolithiasis 11/29/2012  ? Pregnancy induced hypertension   ? labetolol  ? Preterm labor   ? delivery  ? ? ?Patient Active Problem List  ? Diagnosis Date Noted  ? Iron deficiency 12/05/2020  ? History of cesarean section 04/01/2020  ? BMI 40.0-44.9, adult (HCC) 12/19/2019  ? Depression, major, single episode 09/27/2018  ? History of gestational diabetes 04/16/2016  ? Episodic paroxysmal anxiety disorder 09/06/2015  ? Vitamin D insufficiency 11/30/2014  ? Chronic fatigue 11/27/2014  ? History of VBAC 11/27/2014  ? Benign essential hypertension 04/28/2013  ? Anxiety with flying   ? GERD 12/23/2007  ? Acquired cyst of kidney 12/23/2007  ? ? ?Past Surgical History:  ?Procedure Laterality Date  ? CESAREAN SECTION  2006  ? WISDOM TOOTH EXTRACTION    ? x4  ? ? ?OB History   ? ? Gravida  ?10  ? Para  ?7  ? Term  ?6  ? Preterm  ?1  ? AB  ?3  ? Living  ?7  ?  ? ? SAB  ?3  ? IAB  ?   ? Ectopic  ?   ? Multiple  ?0  ? Live  Births  ?7  ?   ?  ?  ? ? ? ?Home Medications   ? ?Prior to Admission medications   ?Medication Sig Start Date End Date Taking? Authorizing Provider  ?lidocaine (XYLOCAINE) 2 % solution Use as directed 15 mLs in the mouth or throat as needed for mouth pain. 12/28/21  Yes Mickie Bail, NP  ?ascorbic acid (VITAMIN C) 500 MG tablet Take 500-1,000 mg by mouth daily.    [provider]  ?buPROPion (WELLBUTRIN) 100 MG tablet Take 1 tablet (100 mg total) by mouth 2 (two) times daily. ?Patient not taking: No sig reported 08/02/20   Reva Bores, MD  ?ferrous sulfate 325 (65 FE) MG tablet Take 1 tablet (325 mg total) by mouth 2 (two) times daily with a meal. 11/26/20   Parke Poisson, MD  ?furosemide (LASIX) 40 MG tablet Take 1 tablet (40 mg total) by mouth 2 (two) times daily. ?Patient taking differently: Take 40 mg by mouth daily as needed for fluid or edema. 08/02/20   Reva Bores, MD  ?labetalol (NORMODYNE) 100 MG tablet Take 1 tablet (100 mg total) by mouth 2 (two) times daily. 11/25/20  Elouise Munroe, MD  ?Multiple Vitamins-Calcium (ONE-A-DAY WOMENS FORMULA) TABS Take 1 tablet by mouth daily with breakfast.    [provider]  ? ? ?Family History ?Family History  ?Problem Relation Age of Onset  ? Schizophrenia Maternal Grandmother   ? Atrial fibrillation Mother   ? Depression Mother   ? Anxiety disorder Mother   ? Mental illness Father   ? Heart attack Maternal Grandfather   ? Atrial fibrillation Paternal Grandfather   ? Diabetes Paternal Grandfather   ? Heart attack Paternal Grandfather   ? Allergies Sister   ? ? ?Social History ?Social History  ? ?Tobacco Use  ? Smoking status: Never  ? Smokeless tobacco: Never  ?Vaping Use  ? Vaping Use: Never used  ?Substance Use Topics  ? Alcohol use: No  ?  Alcohol/week: 0.0 standard drinks  ? Drug use: No  ? ? ? ?Allergies   ?Iodine, Ivp dye [iodinated contrast media], and Phenergan fortis [promethazine] ? ? ?Review of Systems ?Review of Systems   ?Constitutional:  Positive for fever. Negative for chills.  ?HENT:  Positive for sore throat. Negative for ear pain.   ?Respiratory:  Positive for cough. Negative for shortness of breath.   ?Cardiovascular:  Negative for chest pain and palpitations.  ?Gastrointestinal:  Negative for diarrhea and vomiting.  ?Skin:  Negative for color change and rash.  ?All other systems reviewed and are negative. ? ? ?Physical Exam ?Triage Vital Signs ?ED Triage Vitals  ?Enc Vitals Group  ?   BP   ?   Pulse   ?   Resp   ?   Temp   ?   Temp src   ?   SpO2   ?   Weight   ?   Height   ?   Head Circumference   ?   Peak Flow   ?   Pain Score   ?   Pain Loc   ?   Pain Edu?   ?   Excl. in [place]?   ? ?No data found. ? ?Updated Vital Signs ?BP 136/89   Pulse (!) 114   Temp 99.3 ?F (37.4 ?C)   Resp 18   LMP 12/21/2021 (Approximate)   SpO2 95%   Breastfeeding No  ? ?Visual Acuity ?Right Eye Distance:   ?Left Eye Distance:   ?Bilateral Distance:   ? ?Right Eye Near:   ?Left Eye Near:    ?Bilateral Near:    ? ?Physical Exam ?Vitals and nursing note reviewed.  ?Constitutional:   ?   General: She is not in acute distress. ?   Appearance: She is well-developed.  ?HENT:  ?   Right Ear: Tympanic membrane normal.  ?   Left Ear: Tympanic membrane normal.  ?   Nose: Nose normal.  ?   Mouth/Throat:  ?   Mouth: Mucous membranes are moist.  ?   Pharynx: Posterior oropharyngeal erythema present.  ?Cardiovascular:  ?   Rate and Rhythm: Normal rate and regular rhythm.  ?   Heart sounds: Normal heart sounds.  ?Pulmonary:  ?   Effort: Pulmonary effort is normal. No respiratory distress.  ?   Breath sounds: Normal breath sounds.  ?Musculoskeletal:  ?   Cervical back: Neck supple.  ?Skin: ?   General: Skin is warm and dry.  ?Neurological:  ?   Mental Status: She is alert.  ?Psychiatric:     ?   Mood and Affect: Mood normal.     ?  Behavior: Behavior normal.  ? ? ? ?UC Treatments / Results  ?Labs ?(all labs ordered are listed, but only abnormal results are  displayed) ?Labs Reviewed  ?POCT RAPID STREP A (OFFICE)  ? ? ?EKG ? ? ?Radiology ?No results found. ? ?Procedures ?Procedures (including critical care time) ? ?Medications Ordered in UC ?Medications - No data to display ? ?Initial Impression / Assessment and Plan / UC Course  ?I have reviewed the triage vital signs and the nursing notes. ? ?Pertinent labs & imaging results that were available during my care of the patient were reviewed by me and considered in my medical decision making (see chart for details). ? ?Viral pharyngitis, viral illness.  Strep negative.  Patient declines COVID or flu test.  Treating sore throat with viscous lidocaine.  Discussed continue Tylenol or ibuprofen.  Instructed patient to follow up with her PCP if her symptoms are not improving.  She agrees to plan of care.  ? ? ? ?Final Clinical Impressions(s) / UC Diagnoses  ? ?Final diagnoses:  ?Viral pharyngitis  ?Viral illness  ? ? ? ?Discharge Instructions   ? ?  ?Your strep test is negative.  Use the viscous lidocaine as directed.  Follow up with your primary care provider if your symptoms are not improving.   ? ? ? ? ? ?ED Prescriptions   ? ? Medication Sig Dispense Auth. Provider  ? lidocaine (XYLOCAINE) 2 % solution Use as directed 15 mLs in the mouth or throat as needed for mouth pain. 100 mL Sharion Balloon, NP  ? ?  ? ?PDMP not reviewed this encounter. ?  ?Sharion Balloon, NP ?12/28/21 1522 ? ?

## 2021-12-28 NOTE — ED Triage Notes (Signed)
Pt here with sore throat x 2 days. 

## 2021-12-28 NOTE — Discharge Instructions (Addendum)
Your strep test is negative.  Use the viscous lidocaine as directed.    Follow up with your primary care provider if your symptoms are not improving.    

## 2022-02-25 ENCOUNTER — Telehealth: Payer: Self-pay | Admitting: *Deleted

## 2022-02-25 NOTE — Telephone Encounter (Signed)
Attempted to call pt to schedule New OB interview appt, no answer and voice mail full  ?

## 2022-03-04 ENCOUNTER — Ambulatory Visit (INDEPENDENT_AMBULATORY_CARE_PROVIDER_SITE_OTHER): Payer: Medicaid Other

## 2022-03-04 ENCOUNTER — Ambulatory Visit (INDEPENDENT_AMBULATORY_CARE_PROVIDER_SITE_OTHER): Payer: Medicaid Other | Admitting: Family Medicine

## 2022-03-04 VITALS — BP 172/100 | HR 94

## 2022-03-04 DIAGNOSIS — O021 Missed abortion: Secondary | ICD-10-CM

## 2022-03-04 DIAGNOSIS — O3680X0 Pregnancy with inconclusive fetal viability, not applicable or unspecified: Secondary | ICD-10-CM | POA: Diagnosis not present

## 2022-03-04 NOTE — Progress Notes (Signed)
? ?  Subjective:  ? ? Patient ID: [name] is a 41 y.o. female presenting with new OB interview  ? on 03/04/2022 ? ?HPI: ?Here for viability scan--no bleeding, no cramping. ?On u/s there is a fetal pole without FHR. She is 11 wks by dates and measures Almost 8 weeks on u/s. Blood type is B pos ? ?Review of Systems  ?Constitutional:  Negative for chills and fever.  ?Respiratory:  Negative for shortness of breath.   ?Cardiovascular:  Negative for chest pain.  ?Gastrointestinal:  Negative for abdominal pain, nausea and vomiting.  ?Genitourinary:  Negative for dysuria.  ?Skin:  Negative for rash.  ?   ?Objective:  ?  ?BP (!) 172/100   Pulse 94   LMP 12/15/2021  ?Physical Exam ?Exam conducted with a chaperone present.  ?Constitutional:   ?   General: She is not in acute distress. ?   Appearance: She is well-developed.  ?HENT:  ?   Head: Normocephalic and atraumatic.  ?Eyes:  ?   General: No scleral icterus. ?Cardiovascular:  ?   Rate and Rhythm: Normal rate.  ?Pulmonary:  ?   Effort: Pulmonary effort is normal.  ?Abdominal:  ?   Palpations: Abdomen is soft.  ?Genitourinary: ?   General: Normal vulva.  ?Musculoskeletal:  ?   Cervical back: Neck supple.  ?Skin: ?   General: Skin is warm and dry.  ?Neurological:  ?   Mental Status: She is alert and oriented to person, place, and time.  ? ?Limited u/s reveals SIUP at 7 wk 6 days without FHR. Left and right ovaries appear normal. No free pelvic fluid. ? ?   ?Assessment & Plan:  ? ?Problem List Items Addressed This Visit   ?None ?Visit Diagnoses   ? ? Encounter to determine fetal viability of pregnancy, single or unspecified fetus    -  Primary  ? Relevant Orders  ? US OB Limited  ? Missed ab      ? options reviewed--elects for D & E--booked for next week. Orders placed.  ? ?  ? ? ? ?Return in about 3 weeks (around 03/25/2022) for postop check. ? ?Reva Bores, MD ?03/04/2022 ?4:58 PM ? ? ? ?

## 2022-03-04 NOTE — Progress Notes (Signed)
Patient informed that the ultrasound is considered a limited obstetric ultrasound and is not intended to be a complete ultrasound exam.  Patient also informed that the ultrasound is not being completed with the intent of assessing for fetal or placental anomalies or any pelvic abnormalities. Explained that the purpose of today's ultrasound is to assess for fetal heart rate.  Patient acknowledges the purpose of the exam and the limitations of the study.         

## 2022-03-05 ENCOUNTER — Other Ambulatory Visit: Payer: Self-pay

## 2022-03-05 ENCOUNTER — Encounter (HOSPITAL_COMMUNITY): Payer: Self-pay | Admitting: Obstetrics and Gynecology

## 2022-03-05 NOTE — Progress Notes (Signed)
Spoke with pt for pre-op call. Pt has hx of gestational HTN and states she was on Labetalol for a while after her last pregnancy about 2 years ago. Not taking it at this time. She is planning to see her PCP soon about whether she needs to start it back. She states it's been running high since finding about this pregnancy and recent miscarriage. Pt states she had gestational diabetes with her 1st pregnancy, but did not have with her 2nd pregnancy.   Shower instructions given to pt.

## 2022-03-05 NOTE — H&P (Signed)
[name] is an 41 y.o. female with Missed AB, confirmed by [facility] surgerical management  Blood type B positive  Menstrual History: Menarche age: 27 Patient's last menstrual period was 12/15/2021.    Past Medical History:  Diagnosis Date   Allergic rhinitis 09/06/2015   Anemia    Anginal pain (HCC)    Anxiety    COVID    1st time 2021, 2nd time this year 2023  Pt states she was not hospitalized. States she was pretty sick   Deaf, left    Depression    Excess, menstruation 09/06/2015   Gestational diabetes 2008   glyburide this pregnancy   History of chicken pox    History of COVID-19 03/11/2020   April 2021   History of gestational diabetes 04/16/2016   Early a1c neg   History of kidney stones    Hydronephrosis, right 11/08/2012   Insomnia 09/06/2015   Kidney stones 2013   13 passed   Nephrolithiasis 11/29/2012   Pregnancy induced hypertension    labetolol   Preterm labor    delivery    Past Surgical History:  Procedure Laterality Date   CESAREAN SECTION  2006   WISDOM TOOTH EXTRACTION     x4    Family History  Problem Relation Age of Onset   Schizophrenia Maternal Grandmother    Atrial fibrillation Mother    Depression Mother    Anxiety disorder Mother    Mental illness Father    Heart attack Maternal Grandfather    Atrial fibrillation Paternal Grandfather    Diabetes Paternal Grandfather    Heart attack Paternal Grandfather    Allergies Sister     Social History:  reports that she has never smoked. She has never used smokeless tobacco. She reports that she does not drink alcohol and does not use drugs.  Allergies:  Allergies  Allergen Reactions   Iodine Anaphylaxis   Ivp Dye [Iodinated Contrast Media] Anaphylaxis   Phenergan Fortis [Promethazine] Itching and Other (See Comments)    "Itching/pins and needles for hours"    No medications prior to admission.    Review of Systems  Constitutional: Negative.   Respiratory: Negative.     Cardiovascular: Negative.   Gastrointestinal: Negative.   Genitourinary: Negative.    Last menstrual period 12/15/2021, not currently breastfeeding. Physical Exam Constitutional:      Appearance: Normal appearance.  Cardiovascular:     Rate and Rhythm: Normal rate and regular rhythm.  Pulmonary:     Effort: Pulmonary effort is normal.     Breath sounds: Normal breath sounds.  Abdominal:     General: Bowel sounds are normal.     Palpations: Abdomen is soft.  Genitourinary:    Comments: Deferred to OR Neurological:     Mental Status: She is alert.    No results found for this or any previous visit (from the past 24 hour(s)).  No results found.  Assessment/Plan: Missed AB  Pt for suction D & C for missed AB. R/B/Post op care have been reviewed with pt. Pt has verbalized understanding and desires to proceed.  Hermina Staggers 03/05/2022, 10:34 AM

## 2022-03-09 ENCOUNTER — Ambulatory Visit (HOSPITAL_COMMUNITY)
Admission: RE | Admit: 2022-03-09 | Discharge: 2022-03-09 | Disposition: A | Discharge status: Home / Self Care | Payer: Medicaid Other | Attending: Obstetrics and Gynecology | Admitting: Obstetrics and Gynecology

## 2022-03-09 ENCOUNTER — Ambulatory Visit (HOSPITAL_BASED_OUTPATIENT_CLINIC_OR_DEPARTMENT_OTHER): Payer: Medicaid Other | Admitting: Anesthesiology

## 2022-03-09 ENCOUNTER — Encounter (HOSPITAL_COMMUNITY)
Admission: RE | Disposition: A | Discharge status: Home / Self Care | Payer: Self-pay | Source: Home / Self Care | Attending: Obstetrics and Gynecology

## 2022-03-09 ENCOUNTER — Other Ambulatory Visit: Payer: Self-pay

## 2022-03-09 ENCOUNTER — Encounter (HOSPITAL_COMMUNITY): Payer: Self-pay | Admitting: Obstetrics and Gynecology

## 2022-03-09 ENCOUNTER — Ambulatory Visit (HOSPITAL_COMMUNITY): Payer: Medicaid Other | Admitting: Anesthesiology

## 2022-03-09 DIAGNOSIS — Z3A09 9 weeks gestation of pregnancy: Secondary | ICD-10-CM | POA: Diagnosis not present

## 2022-03-09 DIAGNOSIS — O021 Missed abortion: Secondary | ICD-10-CM

## 2022-03-09 DIAGNOSIS — Z6841 Body Mass Index (BMI) 40.0 and over, adult: Secondary | ICD-10-CM | POA: Diagnosis not present

## 2022-03-09 HISTORY — DX: COVID-19: U07.1

## 2022-03-09 HISTORY — DX: Anemia, unspecified: D64.9

## 2022-03-09 HISTORY — PX: DILATION AND EVACUATION: SHX1459

## 2022-03-09 HISTORY — DX: Depression, unspecified: F32.A

## 2022-03-09 HISTORY — DX: Personal history of urinary calculi: Z87.442

## 2022-03-09 LAB — TYPE AND SCREEN
ABO/RH(D): B POS
Antibody Screen: NEGATIVE

## 2022-03-09 LAB — CBC
HCT: 32 % — ABNORMAL LOW (ref 36.0–46.0)
Hemoglobin: 9.2 g/dL — ABNORMAL LOW (ref 12.0–15.0)
MCH: 21.4 pg — ABNORMAL LOW (ref 26.0–34.0)
MCHC: 28.8 g/dL — ABNORMAL LOW (ref 30.0–36.0)
MCV: 74.6 fL — ABNORMAL LOW (ref 80.0–100.0)
Platelets: 247 10*3/uL (ref 150–400)
RBC: 4.29 MIL/uL (ref 3.87–5.11)
RDW: 17.5 % — ABNORMAL HIGH (ref 11.5–15.5)
WBC: 5.4 10*3/uL (ref 4.0–10.5)
nRBC: 0 % (ref 0.0–0.2)

## 2022-03-09 SURGERY — DILATION AND EVACUATION, UTERUS
Anesthesia: General

## 2022-03-09 MED ORDER — KETOROLAC TROMETHAMINE 30 MG/ML IJ SOLN
INTRAMUSCULAR | Status: DC | PRN
  Administered 2022-03-09: 30 mg via INTRAVENOUS

## 2022-03-09 MED ORDER — OXYCODONE HCL 5 MG/5ML PO SOLN: 5.0000 mg | Freq: Once | ORAL | Status: AC | PRN

## 2022-03-09 MED ORDER — OXYCODONE HCL 5 MG PO TABS
ORAL_TABLET | ORAL | Status: AC
  Filled 2022-03-09: qty 1

## 2022-03-09 MED ORDER — MUPIROCIN 2 % EX OINT
1.0000 | TOPICAL_OINTMENT | Freq: Once | CUTANEOUS | Status: AC
Start: 2022-03-09 — End: 2022-03-09

## 2022-03-09 MED ORDER — MISOPROSTOL 200 MCG PO TABS
ORAL_TABLET | ORAL | Status: DC | PRN
Start: 2022-03-09 — End: 2022-03-09
  Administered 2022-03-09: 200 ug via ORAL

## 2022-03-09 MED ORDER — OXYCODONE HCL 5 MG PO TABS: 5.0000 mg | ORAL_TABLET | Freq: Four times a day (QID) | ORAL | 0 refills | Status: DC | PRN

## 2022-03-09 MED ORDER — FERRIC SUBSULFATE 259 MG/GM EX SOLN
CUTANEOUS | Status: AC
  Filled 2022-03-09: qty 8

## 2022-03-09 MED ORDER — SCOPOLAMINE 1 MG/3DAYS TD PT72: 1.0000 | MEDICATED_PATCH | TRANSDERMAL | Status: DC

## 2022-03-09 MED ORDER — LIDOCAINE 2% (20 MG/ML) 5 ML SYRINGE
INTRAMUSCULAR | Status: DC | PRN
  Administered 2022-03-09: 60 mg via INTRAVENOUS

## 2022-03-09 MED ORDER — ONDANSETRON HCL 4 MG/2ML IJ SOLN: 4.0000 mg | Freq: Once | INTRAMUSCULAR | Status: DC | PRN

## 2022-03-09 MED ORDER — PROPOFOL 10 MG/ML IV BOLUS
INTRAVENOUS | Status: DC | PRN
  Administered 2022-03-09: 200 mg via INTRAVENOUS

## 2022-03-09 MED ORDER — FENTANYL CITRATE (PF) 250 MCG/5ML IJ SOLN
INTRAMUSCULAR | Status: AC
  Filled 2022-03-09: qty 5

## 2022-03-09 MED ORDER — MUPIROCIN 2 % EX OINT
TOPICAL_OINTMENT | CUTANEOUS | Status: AC
  Administered 2022-03-09: 1 via TOPICAL
  Filled 2022-03-09: qty 22

## 2022-03-09 MED ORDER — DEXAMETHASONE SODIUM PHOSPHATE 10 MG/ML IJ SOLN
INTRAMUSCULAR | Status: DC | PRN
  Administered 2022-03-09: 10 mg via INTRAVENOUS

## 2022-03-09 MED ORDER — IBUPROFEN 800 MG PO TABS: 800.0000 mg | ORAL_TABLET | Freq: Three times a day (TID) | ORAL | 0 refills | Status: DC | PRN

## 2022-03-09 MED ORDER — MIDAZOLAM HCL 2 MG/2ML IJ SOLN
INTRAMUSCULAR | Status: AC
  Filled 2022-03-09: qty 2

## 2022-03-09 MED ORDER — FENTANYL CITRATE (PF) 250 MCG/5ML IJ SOLN
INTRAMUSCULAR | Status: DC | PRN
  Administered 2022-03-09: 50 ug via INTRAVENOUS
  Administered 2022-03-09: 25 ug via INTRAVENOUS
  Administered 2022-03-09: 50 ug via INTRAVENOUS

## 2022-03-09 MED ORDER — BUPIVACAINE HCL 0.25 % IJ SOLN
INTRAMUSCULAR | Status: DC | PRN
  Administered 2022-03-09: 10 mL

## 2022-03-09 MED ORDER — AMISULPRIDE (ANTIEMETIC) 5 MG/2ML IV SOLN: 10.0000 mg | Freq: Once | INTRAVENOUS | Status: DC | PRN

## 2022-03-09 MED ORDER — LACTATED RINGERS IV SOLN: INTRAVENOUS | Status: DC

## 2022-03-09 MED ORDER — ONDANSETRON HCL 4 MG/2ML IJ SOLN
INTRAMUSCULAR | Status: DC | PRN
  Administered 2022-03-09: 4 mg via INTRAVENOUS

## 2022-03-09 MED ORDER — BUPIVACAINE HCL (PF) 0.25 % IJ SOLN
INTRAMUSCULAR | Status: AC
  Filled 2022-03-09: qty 10

## 2022-03-09 MED ORDER — ACETAMINOPHEN 500 MG PO TABS: 1000.0000 mg | ORAL_TABLET | Freq: Once | ORAL | Status: AC

## 2022-03-09 MED ORDER — HYDROMORPHONE HCL 1 MG/ML IJ SOLN
INTRAMUSCULAR | Status: AC
  Filled 2022-03-09: qty 1

## 2022-03-09 MED ORDER — HYDROMORPHONE HCL 1 MG/ML IJ SOLN
0.2500 mg | INTRAMUSCULAR | Status: DC | PRN
  Administered 2022-03-09: 0.5 mg via INTRAVENOUS

## 2022-03-09 MED ORDER — ACETAMINOPHEN 500 MG PO TABS
ORAL_TABLET | ORAL | Status: AC
  Administered 2022-03-09: 1000 mg via ORAL
  Filled 2022-03-09: qty 2

## 2022-03-09 MED ORDER — SODIUM CHLORIDE 0.9 % IV SOLN
100.0000 mg | INTRAVENOUS | Status: AC
  Administered 2022-03-09: 100 mg via INTRAVENOUS
  Filled 2022-03-09: qty 100

## 2022-03-09 MED ORDER — OXYCODONE HCL 5 MG PO TABS
5.0000 mg | ORAL_TABLET | Freq: Once | ORAL | Status: AC | PRN
  Administered 2022-03-09: 5 mg via ORAL

## 2022-03-09 MED ORDER — KETOROLAC TROMETHAMINE 30 MG/ML IJ SOLN
INTRAMUSCULAR | Status: AC
  Filled 2022-03-09: qty 1

## 2022-03-09 MED ORDER — ACETAMINOPHEN 500 MG PO TABS: 1000.0000 mg | ORAL_TABLET | ORAL | Status: DC

## 2022-03-09 MED ORDER — CHLORHEXIDINE GLUCONATE 0.12 % MT SOLN
OROMUCOSAL | Status: AC
  Administered 2022-03-09: 15 mL
  Filled 2022-03-09: qty 15

## 2022-03-09 MED ORDER — SCOPOLAMINE 1 MG/3DAYS TD PT72
MEDICATED_PATCH | TRANSDERMAL | Status: AC
  Administered 2022-03-09: 1.5 mg via TRANSDERMAL
  Filled 2022-03-09: qty 1

## 2022-03-09 MED ORDER — PROPOFOL 10 MG/ML IV BOLUS
INTRAVENOUS | Status: AC
  Filled 2022-03-09: qty 20

## 2022-03-09 MED ORDER — MIDAZOLAM HCL 2 MG/2ML IJ SOLN
INTRAMUSCULAR | Status: DC | PRN
  Administered 2022-03-09: 2 mg via INTRAVENOUS

## 2022-03-09 MED ORDER — MEPERIDINE HCL 25 MG/ML IJ SOLN: 6.2500 mg | INTRAMUSCULAR | Status: DC | PRN

## 2022-03-09 MED ORDER — KETOROLAC TROMETHAMINE 30 MG/ML IJ SOLN: 30.0000 mg | Freq: Once | INTRAMUSCULAR | Status: DC | PRN

## 2022-03-09 MED ORDER — MISOPROSTOL 200 MCG PO TABS
ORAL_TABLET | ORAL | Status: AC
  Filled 2022-03-09: qty 1

## 2022-03-09 SURGICAL SUPPLY — 23 items
CATH ROBINSON RED A/P 16FR (CATHETERS) ×2 IMPLANT
DECANTER SPIKE VIAL GLASS SM (MISCELLANEOUS) ×2 IMPLANT
FILTER UTR ASPR ASSEMBLY (MISCELLANEOUS) ×2 IMPLANT
GLOVE BIO SURGEON STRL SZ7.5 (GLOVE) ×2 IMPLANT
GLOVE BIOGEL PI IND STRL 7.0 (GLOVE) ×1 IMPLANT
GLOVE BIOGEL PI INDICATOR 7.0 (GLOVE) ×1
GOWN STRL REUS W/ TWL LRG LVL3 (GOWN DISPOSABLE) ×1 IMPLANT
GOWN STRL REUS W/ TWL XL LVL3 (GOWN DISPOSABLE) ×1 IMPLANT
GOWN STRL REUS W/TWL LRG LVL3 (GOWN DISPOSABLE) ×2
GOWN STRL REUS W/TWL XL LVL3 (GOWN DISPOSABLE) ×2
HOSE CONNECTING 18IN BERKELEY (TUBING) ×2 IMPLANT
KIT BERKELEY 1ST TRI 3/8 NO TR (MISCELLANEOUS) ×2 IMPLANT
KIT BERKELEY 1ST TRIMESTER 3/8 (MISCELLANEOUS) ×2 IMPLANT
NS IRRIG 1000ML POUR BTL (IV SOLUTION) ×2 IMPLANT
PACK VAGINAL MINOR WOMEN LF (CUSTOM PROCEDURE TRAY) ×2 IMPLANT
PAD OB MATERNITY 4.3X12.25 (PERSONAL CARE ITEMS) ×2 IMPLANT
SET BERKELEY SUCTION TUBING (SUCTIONS) ×2 IMPLANT
TOWEL GREEN STERILE FF (TOWEL DISPOSABLE) ×4 IMPLANT
UNDERPAD 30X36 HEAVY ABSORB (UNDERPADS AND DIAPERS) ×2 IMPLANT
VACURETTE 10 RIGID CVD (CANNULA) IMPLANT
VACURETTE 7MM CVD STRL WRAP (CANNULA) IMPLANT
VACURETTE 8 RIGID CVD (CANNULA) IMPLANT
VACURETTE 9 RIGID CVD (CANNULA) IMPLANT

## 2022-03-09 NOTE — Interval H&P Note (Signed)
History and Physical Interval Note:  03/09/2022 4:06 PM  Lise Auer Disanti  has presented today for surgery, with the diagnosis of MAB.  The various methods of treatment have been discussed with the patient and family. After consideration of risks, benefits and other options for treatment, the patient has consented to  Procedure(s): DILATATION AND EVACUATION (N/A) as a surgical intervention.  The patient's history has been reviewed, patient examined, no change in status, stable for surgery.  I have reviewed the patient's chart and labs.  Questions were answered to the patient's satisfaction.     Hermina Staggers

## 2022-03-09 NOTE — Anesthesia Preprocedure Evaluation (Addendum)
Anesthesia Evaluation  Patient identified by MRN, date of birth, ID band Patient awake    Reviewed: Allergy & Precautions, NPO status , Patient's Chart, lab work & pertinent test results  Airway Mallampati: IV  TM Distance: >3 FB Neck ROM: Full    Dental no notable dental hx. (+) Teeth Intact, Dental Advisory Given   Pulmonary neg pulmonary ROS,    Pulmonary exam normal breath sounds clear to auscultation       Cardiovascular hypertension (gest HTN, not currently on meds- per pt her HTN is situational), Normal cardiovascular exam Rhythm:Regular Rate:Normal     Neuro/Psych PSYCHIATRIC DISORDERS Anxiety Depression negative neurological ROS     GI/Hepatic Neg liver ROS, GERD  Controlled,  Endo/Other  diabetes, GestationalMorbid obesityBMI 40  Renal/GU negative Renal ROS  negative genitourinary   Musculoskeletal negative musculoskeletal ROS (+)   Abdominal (+) + obese,   Peds  Hematology negative hematology ROS (+)   Anesthesia Other Findings   Reproductive/Obstetrics negative OB ROS MAB 11weeks                            Anesthesia Physical Anesthesia Plan  ASA: 3  Anesthesia Plan: General   Post-op Pain Management: Tylenol PO (pre-op)*   Induction: Intravenous  PONV Risk Score and Plan: 4 or greater and Ondansetron, Dexamethasone, Midazolam, Scopolamine patch - Pre-op and Treatment may vary due to age or medical condition  Airway Management Planned: LMA  Additional Equipment: None  Intra-op Plan:   Post-operative Plan: Extubation in OR  Informed Consent: I have reviewed the patients History and Physical, chart, labs and discussed the procedure including the risks, benefits and alternatives for the proposed anesthesia with the patient or authorized representative who has indicated his/her understanding and acceptance.     Dental advisory given  Plan Discussed with:  CRNA  Anesthesia Plan Comments:        Anesthesia Quick Evaluation

## 2022-03-09 NOTE — Anesthesia Postprocedure Evaluation (Signed)
Anesthesia Post Note  Patient: [name]  Procedure(s) Performed: DILATATION AND EVACUATION     Patient location during evaluation: PACU Anesthesia Type: General Level of consciousness: awake and alert, oriented and patient cooperative Pain management: pain level controlled Vital Signs Assessment: post-procedure vital signs reviewed and stable Respiratory status: spontaneous breathing, nonlabored ventilation and respiratory function stable Cardiovascular status: blood pressure returned to baseline and stable Postop Assessment: no apparent nausea or vomiting Anesthetic complications: no   No notable events documented.  Last Vitals:  Vitals:   03/09/22 1315 03/09/22 1346  BP: (!) 178/99 (!) 152/96  Pulse: 80   Resp: 18   Temp: 36.7 C   SpO2: 100%     Last Pain:  Vitals:   03/09/22 1320  TempSrc:   PainSc: 0-No pain                 Lannie Fields

## 2022-03-09 NOTE — Anesthesia Procedure Notes (Signed)
Procedure Name: LMA Insertion Date/Time: 03/09/2022 4:23 PM Performed by: Dorthea Cove, CRNA Pre-anesthesia Checklist: Patient identified, Emergency Drugs available, Suction available and Patient being monitored Patient Re-evaluated:Patient Re-evaluated prior to induction Oxygen Delivery Method: Circle System Utilized Preoxygenation: Pre-oxygenation with 100% oxygen Induction Type: IV induction Ventilation: Mask ventilation without difficulty LMA: LMA inserted LMA Size: 4.0 Number of attempts: 1 Airway Equipment and Method: Bite block Placement Confirmation: positive ETCO2 Tube secured with: Tape Dental Injury: Teeth and Oropharynx as per pre-operative assessment

## 2022-03-09 NOTE — Transfer of Care (Signed)
Immediate Anesthesia Transfer of Care Note  Patient: [name]  Procedure(s) Performed: DILATATION AND EVACUATION  Patient Location: PACU  Anesthesia Type:General  Level of Consciousness: awake, alert  and oriented  Airway & Oxygen Therapy: Patient Spontanous Breathing  Post-op Assessment: Report given to RN and Post -op Vital signs reviewed and stable  Post vital signs: Reviewed and stable  Last Vitals:  Vitals Value Taken Time  BP 155/92 03/09/22 1655  Temp    Pulse 96 03/09/22 1656  Resp 21 03/09/22 1656  SpO2 99 % 03/09/22 1656  Vitals shown include unvalidated device data.  Last Pain:  Vitals:   03/09/22 1320  TempSrc:   PainSc: 0-No pain         Complications: No notable events documented.

## 2022-03-09 NOTE — Op Note (Signed)
Lise Auer Szilagyi PROCEDURE DATE: 03/09/2022  PREOPERATIVE DIAGNOSIS: 9 week missed abortion POSTOPERATIVE DIAGNOSIS: The same PROCEDURE:     Dilation and Evacuation SURGEON:  Dr. Nettie Elm  INDICATIONS: 41 y.o. [id number] with MAB at [redacted] weeks gestation, needing surgical completion.  Risks of surgery were discussed with the patient including but not limited to: bleeding which may require transfusion; infection which may require antibiotics; injury to uterus or surrounding organs; need for additional procedures including laparotomy or laparoscopy; possibility of intrauterine scarring which may impair future fertility; and other postoperative/anesthesia complications. Written informed consent was obtained.    FINDINGS:  A 11 week size uterus, moderate amounts of products of conception, specimen sent to pathology.  ANESTHESIA:    Monitored intravenous sedation, paracervical block. INTRAVENOUS FLUIDS: As recorded ESTIMATED BLOOD LOSS:  Less than 20 ml. SPECIMENS:  Products of conception sent to pathology COMPLICATIONS:  None immediate.  PROCEDURE DETAILS:  The patient received intravenous Doxycycline while in the preoperative area.  She was then taken to the operating room where monitored intravenous sedation was administered and was found to be adequate.  After an adequate timeout was performed, she was placed in the dorsal lithotomy position and examined; then prepped and draped in the sterile manner.   Her bladder was catheterized for an unmeasured amount of clear, yellow urine. A vaginal speculum was then placed in the patient's vagina and a single tooth tenaculum was applied to the anterior lip of the cervix.  A paracervical block using 10 ml of 0.5% Marcaine was administered. The cervix was gently dilated to accommodate a 8 mm suction curette that was gently advanced to the uterine fundus.  The suction device was then activated and curette slowly rotated to clear the uterus of products of  conception.  A sharp curettage was then performed to confirm complete emptying of the uterus. There was minimal bleeding noted and the tenaculum removed with good hemostasis noted.   All instruments were removed from the patient's vagina.  Sponge and instrument counts were correct times two  The patient tolerated the procedure well and was taken to the recovery area awake, and in stable condition.  The patient will be discharged to home as per PACU criteria.  Routine postoperative instructions given.  She was prescribed Percocet & Ibuprofen She will follow up in the clinic in 3-4 weeks for postoperative evaluation.   Nettie Elm, MD, FACOG Attending Obstetrician & Gynecologist Faculty Practice, [facility]

## 2022-03-10 ENCOUNTER — Encounter (HOSPITAL_COMMUNITY): Payer: Self-pay | Admitting: Obstetrics and Gynecology

## 2022-03-11 LAB — SURGICAL PATHOLOGY

## 2022-03-25 ENCOUNTER — Encounter: Payer: Medicaid Other | Admitting: Family Medicine

## 2022-04-07 ENCOUNTER — Encounter: Payer: Self-pay | Admitting: Family Medicine

## 2023-01-18 ENCOUNTER — Telehealth: Payer: Self-pay

## 2023-01-18 NOTE — Telephone Encounter (Signed)
Attempted to reach patient regarding message left with answering service.

## 2023-02-10 ENCOUNTER — Ambulatory Visit (INDEPENDENT_AMBULATORY_CARE_PROVIDER_SITE_OTHER): Payer: Medicaid Other | Admitting: Family Medicine

## 2023-02-10 ENCOUNTER — Encounter: Payer: Self-pay | Admitting: Family Medicine

## 2023-02-10 VITALS — BP 140/89 | HR 97 | Wt 317.2 lb

## 2023-02-10 DIAGNOSIS — R102 Pelvic and perineal pain: Secondary | ICD-10-CM

## 2023-02-10 DIAGNOSIS — I10 Essential (primary) hypertension: Secondary | ICD-10-CM | POA: Diagnosis not present

## 2023-02-10 DIAGNOSIS — K429 Umbilical hernia without obstruction or gangrene: Secondary | ICD-10-CM | POA: Diagnosis not present

## 2023-02-10 MED ORDER — DOXYCYCLINE HYCLATE 100 MG PO CAPS: 100.0000 mg | ORAL_CAPSULE | Freq: Two times a day (BID) | ORAL | 0 refills | Status: DC

## 2023-02-10 NOTE — Assessment & Plan Note (Signed)
Obvious umbilical hernia, needs surgical repair, will refer--she is self-pay--will look into insurance options.

## 2023-02-10 NOTE — Assessment & Plan Note (Signed)
Trial of doxy, check an u/s. Consider EMB and pap which is needed but she declined today due to lack of insurance.

## 2023-02-10 NOTE — Progress Notes (Signed)
   Subjective:    Patient ID: [name] is a 42 y.o. female presenting with Gynecologic Exam  on 02/10/2023  HPI: Has an aching pain in her abdomen, lower abdomen. She feels like after her D & C she has had a lot of pain. Got more pain meds from her PCP.  This was almost 1 year ago. Notes increasing pain since then, though not as intense. Pain is in the middle and it awakens her at night. Felt like she had severe pain, several months later, and a ripping sensation which changed her orgasms, and then remained sore. She feels like most of it has healed some. She has noticed having severe pain in her belly button area. ? Hernia in her abdomen. Has some left upper abdominal pain. She cannot lift or do her physical work at home. Radiated to the lower pelvis. Cycles are regular and are very heavy. Notes old blood and black comes out.   Review of Systems  Constitutional:  Negative for chills and fever.  Respiratory:  Negative for shortness of breath.   Cardiovascular:  Negative for chest pain.  Gastrointestinal:  Negative for abdominal pain, nausea and vomiting.  Genitourinary:  Negative for dysuria.  Skin:  Negative for rash.      Objective:    BP (!) 140/89   Pulse 97   Wt (!) 317 lb 3.2 oz (143.9 kg)   LMP 01/27/2023 (Approximate)   BMI 41.85 kg/m  Physical Exam Exam conducted with a chaperone present.  Constitutional:      General: She is not in acute distress.    Appearance: She is well-developed.  HENT:     Head: Normocephalic and atraumatic.  Eyes:     General: No scleral icterus. Cardiovascular:     Rate and Rhythm: Normal rate.  Pulmonary:     Effort: Pulmonary effort is normal.  Abdominal:     Palpations: Abdomen is soft.     Hernia: A hernia (umbilical) is present.  Musculoskeletal:     Cervical back: Neck supple.  Skin:    General: Skin is warm and dry.  Neurological:     Mental Status: She is alert and oriented to person, place, and time.          Assessment & Plan:   Problem List Items Addressed This Visit       Medium    Benign essential hypertension - Primary    DASH diet, BP is reportedly normal at home        Unprioritized   Pelvic pain    Trial of doxy, check an u/s. Consider EMB and pap which is needed but she declined today due to lack of insurance.      Relevant Medications   doxycycline (VIBRAMYCIN) 100 MG capsule   Other Relevant Orders   US PELVIC COMPLETE WITH TRANSVAGINAL   Umbilical hernia without obstruction and without gangrene    Obvious umbilical hernia, needs surgical repair, will refer--she is self-pay--will look into insurance options.      Relevant Orders   Ambulatory referral to General Surgery    Return in about 2 months (around 04/12/2023) for a follow-up.  Reva Bores, MD 02/10/2023 2:16 PM

## 2023-02-10 NOTE — Assessment & Plan Note (Signed)
DASH diet, BP is reportedly normal at home

## 2023-02-10 NOTE — Progress Notes (Signed)
CC: Had labs drawn at another office which she has on her cell phone  Had D & C last year pelvic and back pain was severe, Pt stating that she is still having pain and felt a ripping sensation and orgasms were not occurring, does not feel back to normal    Lower abdominal pain radiating down to pelvic  Left upper quad- poking out with physical activities

## 2023-02-17 ENCOUNTER — Ambulatory Visit
Admission: RE | Admit: 2023-02-17 | Discharge: 2023-02-17 | Disposition: A | Discharge status: Home / Self Care | Payer: Medicaid Other | Source: Ambulatory Visit | Attending: Family Medicine | Admitting: Family Medicine

## 2023-02-17 DIAGNOSIS — R102 Pelvic and perineal pain: Secondary | ICD-10-CM | POA: Insufficient documentation

## 2023-02-17 NOTE — Progress Notes (Signed)
Patient ID: [name], female   DOB: 1981-04-17, 42 y.o.   MRN: [id number]  Chief Complaint: Umbilical hernia  History of Present Illness [name] is a 42 y.o. female with first noted during pregnancy, 3 plus years ago.  Pain was noted approximately 6 months ago, with increased bulging.  Pain was provoked primarily through activity or heavy lifting.  Frequently causes pain through the same activities, increasing of late.  Denies any associated nausea or vomiting.  Reports bowel habits as stable.  Denies any fevers or chills.  She also reports some sporadic left costal/subcostal pain/discomfort.  Uncertain etiology, no prior surgery to this area.  Feels it is not reproducible with pressure to the costal margin.  Past Medical History Past Medical History:  Diagnosis Date   Allergic rhinitis 09/06/2015   Anemia    Anginal pain (HCC)    Anxiety    COVID    1st time 2021, 2nd time this year 2023  Pt states she was not hospitalized. States she was pretty sick   Deaf, left    Depression    Excess, menstruation 09/06/2015   Gestational diabetes 2008   glyburide this pregnancy   History of chicken pox    History of COVID-19 03/11/2020   April 2021   History of gestational diabetes 04/16/2016   Early a1c neg   History of kidney stones    Hydronephrosis, right 11/08/2012   Insomnia 09/06/2015   Kidney stones 2013   13 passed   Nephrolithiasis 11/29/2012   Pregnancy induced hypertension    labetolol   Preterm labor    delivery      Past Surgical History:  Procedure Laterality Date   CESAREAN SECTION  2006   DILATION AND EVACUATION N/A 03/09/2022   Procedure: DILATATION AND EVACUATION;  Surgeon: Hermina Staggers, MD;  Location: [facility] OR;  Service: Gynecology;  Laterality: N/A;   WISDOM TOOTH EXTRACTION     x4    Allergies  Allergen Reactions   Iodine Anaphylaxis   Ivp Dye [Iodinated Contrast Media] Anaphylaxis   Phenergan Fortis [Promethazine] Itching and Other (See  Comments)    "Itching/pins and needles for hours"    Current Outpatient Medications  Medication Sig Dispense Refill   Desiccated Beef Liver POWD by Does not apply route.     doxycycline (VIBRAMYCIN) 100 MG capsule Take 1 capsule (100 mg total) by mouth 2 (two) times daily. 20 capsule 0   Multiple Vitamins-Minerals (MULTIVITAMIN WITH MINERALS) tablet Take 1 tablet by mouth daily.     No current facility-administered medications for this visit.    Family History Family History  Problem Relation Age of Onset   Schizophrenia Maternal Grandmother    Atrial fibrillation Mother    Depression Mother    Anxiety disorder Mother    Mental illness Father    Heart attack Maternal Grandfather    Atrial fibrillation Paternal Grandfather    Diabetes Paternal Grandfather    Heart attack Paternal Grandfather    Allergies Sister       Social History Social History   Tobacco Use   Smoking status: Never    Passive exposure: Never   Smokeless tobacco: Never  Vaping Use   Vaping Use: Never used  Substance Use Topics   Alcohol use: No    Alcohol/week: 0.0 standard drinks of alcohol   Drug use: No        Review of Systems  Constitutional: Negative.   HENT:  Positive for hearing loss (  Left ear).   Eyes: Negative.   Respiratory: Negative.    Cardiovascular:  Positive for leg swelling.  Gastrointestinal:  Positive for abdominal pain.  Genitourinary: Negative.   Skin: Negative.   Neurological: Negative.   Psychiatric/Behavioral: Negative.       Physical Exam Blood pressure (!) 150/91, pulse 87, temperature 98 F (36.7 C), height 6\' 1"  (1.854 m), weight (!) 317 lb (143.8 kg), last menstrual period 01/27/2023, SpO2 97 %, unknown if currently breastfeeding. Last Weight  Most recent update: 02/18/2023 10:24 AM    Weight  143.8 kg (317 lb)               CONSTITUTIONAL: Well developed, and nourished, appropriately responsive and aware without distress.   EYES: Sclera non-icteric.    EARS, NOSE, MOUTH AND THROAT: Oral mucosa is pink and moist.   Hearing is intact to voice.  NECK: Trachea is midline, and there is no jugular venous distension.  LYMPH NODES:  Lymph nodes in the neck are not appreciated. RESPIRATORY:  Lungs are clear, and breath sounds are equal bilaterally.  Normal respiratory effort without pathologic use of accessory muscles. CARDIOVASCULAR: Heart is regular in rate and rhythm.  Well perfused.  GI: The abdomen is  soft, nontender, and nondistended.  The umbilical concavity is not present, on deep palpation I believe there is a 2 cm fascial defect, with a fully reduced hernia.  There were no other palpable masses.  I did not appreciate hepatosplenomegaly.  MUSCULOSKELETAL:  Symmetrical muscle tone appreciated in all four extremities.    SKIN: Skin turgor is normal. No pathologic skin lesions appreciated.  NEUROLOGIC:  Motor and sensation appear grossly normal.  Cranial nerves are grossly without defect. PSYCH:  Alert and oriented to person, place and time. Affect is appropriate for situation.  Data Reviewed I have personally reviewed what is currently available of the patient's imaging, recent labs and medical records.   Labs:     Latest Ref Rng & Units 03/09/2022    1:57 PM 11/26/2020    4:12 PM 11/25/2020    1:03 PM  CBC  WBC 4.0 - 10.5 K/uL 5.4  7.5  5.8   Hemoglobin 12.0 - 15.0 g/dL 9.2  16.1  09.6   Hematocrit 36.0 - 46.0 % 32.0  37.9  36.3   Platelets 150 - 400 K/uL 247  281  308       Latest Ref Rng & Units 11/26/2020    4:12 PM 11/26/2020    2:25 PM 03/31/2020    6:35 AM  CMP  Glucose 70 - 99 mg/dL 045  409  811   BUN 6 - 20 mg/dL 12  12  11    Creatinine 0.44 - 1.00 mg/dL 9.14  7.82  9.56   Sodium 135 - 145 mmol/L 140  144  136   Potassium 3.5 - 5.1 mmol/L 4.0  4.1  3.9   Chloride 98 - 111 mmol/L 104  107  105   CO2 22 - 32 mmol/L 26  23  21    Calcium 8.9 - 10.3 mg/dL 9.1  8.8  8.7   Total Protein 6.5 - 8.1 g/dL   5.2   Total Bilirubin 0.3 -  1.2 mg/dL   0.5   Alkaline Phos 38 - 126 U/L   72   AST 15 - 41 U/L   15   ALT 0 - 44 U/L   12       Imaging: Radiological images reviewed:  Within last 24 hrs: US PELVIC COMPLETE WITH TRANSVAGINAL  Result Date: 02/17/2023 CLINICAL DATA:  Pelvic pain, diffuse pain since D&C on 03/09/2022, LMP 01/29/2023 EXAM: TRANSABDOMINAL AND TRANSVAGINAL ULTRASOUND OF PELVIS TECHNIQUE: Both transabdominal and transvaginal ultrasound examinations of the pelvis were performed. Transabdominal technique was performed for global imaging of the pelvis including uterus, ovaries, adnexal regions, and pelvic cul-de-sac. It was necessary to proceed with endovaginal exam following the transabdominal exam to visualize the lower uterine segment/cervix and ovaries. COMPARISON:  None Available. FINDINGS: Uterus Measurements: 12.4 x 6.4 x 7.2 cm = volume: 297 mL. Anteverted. Anterior wall Caesarean section scar. Heterogeneous myometrium. No discrete mass. Endometrium Thickness: 15 mm. Thickened, heterogeneous, irregular, question containing hypoechoic central material complex fluid or blood versus heterogeneous increased endometrial thickening of 21 mm. No discrete mass. Right ovary Not visualized, likely obscured by bowel Left ovary Measurements: 3.1 x 2.5 x 3.1 cm = volume: 12.5 mL. Normal morphology without mass Other findings Trace nonspecific free pelvic fluid.  No adnexal masses. IMPRESSION: Nonvisualization of RIGHT ovary. Thickened heterogeneous irregular endometrial complex 15-21 mm thick questionably containing complex fluid; no discrete endometrial mass/polyp. Endometrial thickness is considered abnormal. Consider follow-up by [place] in 6-8 weeks, during the week immediately following menses (exam timing is critical). Remainder of exam unremarkable. Electronically Signed   By: Ulyses Southward M.D.   On: 02/17/2023 15:13    Assessment    Umbilical fascial defect, unexplained left upper quadrant pain. Patient Active Problem  List   Diagnosis Date Noted   Pelvic pain 02/10/2023   Umbilical hernia without obstruction and without gangrene 02/10/2023   Iron deficiency 12/05/2020   History of cesarean section 04/01/2020   BMI 40.0-44.9, adult (HCC) 12/19/2019   Depression, major, single episode 09/27/2018   History of gestational diabetes 04/16/2016   Episodic paroxysmal anxiety disorder 09/06/2015   Vitamin D insufficiency 11/30/2014   Chronic fatigue 11/27/2014   History of VBAC 11/27/2014   Benign essential hypertension 04/28/2013   Anxiety with flying    GERD 12/23/2007   Acquired cyst of kidney 12/23/2007    Plan    Will obtain abdominal pelvic CT scan without IV contrast due to allergy.  Although we could premedicate, I am attempting to evaluate the hernia and screen for other left upper quadrant process. Will have this pleasant couple back after the CT scan to discuss hernia approach, today we discussed a direct approach with primary repair versus a robotic approach utilizing mesh. We discussed the risks of untreated hernias, the need for reduction should persistent pain/increasing pain occur.  I believe they understand and anticipate following through.  Face-to-face time spent with the patient and accompanying care providers(if present) was 30 minutes, with more than 50% of the time spent counseling, educating, and coordinating care of the patient.    These notes generated with voice recognition software. I apologize for typographical errors.  Campbell Lerner M.D., FACS 02/18/2023, 10:59 AM

## 2023-02-18 ENCOUNTER — Encounter: Payer: Self-pay | Admitting: Surgery

## 2023-02-18 ENCOUNTER — Ambulatory Visit (INDEPENDENT_AMBULATORY_CARE_PROVIDER_SITE_OTHER): Payer: Self-pay | Admitting: Surgery

## 2023-02-18 VITALS — BP 150/91 | HR 87 | Temp 98.0°F | Ht 73.0 in | Wt 317.0 lb

## 2023-02-18 DIAGNOSIS — R102 Pelvic and perineal pain: Secondary | ICD-10-CM

## 2023-02-18 DIAGNOSIS — R1012 Left upper quadrant pain: Secondary | ICD-10-CM

## 2023-02-18 DIAGNOSIS — K429 Umbilical hernia without obstruction or gangrene: Secondary | ICD-10-CM

## 2023-02-18 NOTE — Patient Instructions (Addendum)
We will get you scheduled for a CT scan to better assess your hernia to let us know the [place] way to approach your surgery.   We will have you follow up here after we get the results of your CT scan.   You are scheduled for a CT [facility] at [facility] May [facility]th. You will need to arrive at [facility] at 10:45 am.    Follow up here on Thursday May 16th at 10:30 am with Dr Claudine Mouton.   Umbilical Hernia, Adult  A hernia is a bulge of tissue that pushes through an opening between muscles. An umbilical hernia happens in the abdomen, near the belly button (umbilicus). The hernia may contain tissues from the small intestine, large intestine, or fatty tissue covering the intestines. Umbilical hernias in adults tend to get worse over time, and they require surgical treatment. There are different types of umbilical hernias, including: Indirect hernia. This type is located just above or below the umbilicus. It is the most common type of umbilical hernia in adults. Direct hernia. This type forms through an opening formed by the umbilicus. Reducible hernia. This type of hernia comes and goes. It may be visible only when you strain, lift something heavy, or cough. This type of hernia can be pushed back into the abdomen (reduced). Incarcerated hernia. This type traps abdominal tissue inside the hernia. This type of hernia cannot be reduced. Strangulated hernia. This type of hernia cuts off blood flow to the tissues inside the hernia. The tissues can start to die if this happens. This type of hernia requires emergency treatment. What are the causes? An umbilical hernia happens when tissue inside the abdomen presses on a weak area of the abdominal muscles. What increases the risk? You may have a greater risk of this condition if you: Are obese. Have had several pregnancies. Have a buildup of fluid inside your abdomen. Have had surgery that weakens the abdominal muscles. What are the signs or  symptoms? The main symptom of this condition is a painless bulge at or near the belly button. A reducible hernia may be visible only when you strain, lift something heavy, or cough. Other symptoms may include: Dull pain. A feeling of pressure. Symptoms of a strangulated hernia may include: Pain that gets increasingly worse. Nausea and vomiting. Pain when pressing on the hernia. Skin over the hernia becoming red or purple. Constipation. Blood in the stool. How is this diagnosed? This condition may be diagnosed based on: A physical exam. You may be asked to cough or strain while standing. These actions increase the pressure inside your abdomen and can force the hernia through the opening in your muscles. Your health care provider may try to reduce the hernia by pressing on it. Your symptoms and medical history. How is this treated? Surgery is the only treatment for an umbilical hernia. Surgery for a strangulated hernia is done as soon as possible. If you have a small hernia that is not incarcerated, you may need to lose weight before having surgery. Follow these instructions at home: Lose weight, if told by your health care provider. Do not try to push the hernia back in. Watch your hernia for any changes in color or size. Tell your health care provider if any changes occur. You may need to avoid activities that increase pressure on your hernia. Do not lift anything that is heavier than 10 lb (4.5 kg), or the limit that you are told, until your health care provider  says that it is safe. Take over-the-counter and prescription medicines only as told by your health care provider. Keep all follow-up visits. This is important. Contact a health care provider if: Your hernia gets larger. Your hernia becomes painful. Get help right away if: You develop sudden, severe pain near the area of your hernia. You have pain as well as nausea or vomiting. You have pain and the skin over your hernia  changes color. You develop a fever or chills. Summary A hernia is a bulge of tissue that pushes through an opening between muscles. An umbilical hernia happens near the belly button. Surgery is the only treatment for an umbilical hernia. Do not try to push your hernia back in. Keep all follow-up visits. This is important. This information is not intended to replace advice given to you by your health care provider. Make sure you discuss any questions you have with your health care provider. Document Revised: 05/13/2020 Document Reviewed: 05/13/2020 Elsevier Patient Education  2023 [employer].

## 2023-02-19 ENCOUNTER — Telehealth: Payer: Self-pay

## 2023-02-19 ENCOUNTER — Other Ambulatory Visit: Payer: Self-pay | Admitting: *Deleted

## 2023-02-19 DIAGNOSIS — R102 Pelvic and perineal pain: Secondary | ICD-10-CM

## 2023-02-19 NOTE — Telephone Encounter (Signed)
Called pt to confirm if she has insurance prior to scheduling her U/S. Pt vm not set up.

## 2023-02-22 ENCOUNTER — Telehealth: Payer: Self-pay

## 2023-02-22 NOTE — Telephone Encounter (Signed)
Spoke to pt about her ultrasound appointment

## 2023-02-25 ENCOUNTER — Ambulatory Visit: Payer: Self-pay

## 2023-03-04 ENCOUNTER — Ambulatory Visit: Payer: Self-pay | Admitting: Surgery

## 2023-03-12 ENCOUNTER — Inpatient Hospital Stay: Payer: Medicaid Other | Attending: Internal Medicine | Admitting: Internal Medicine

## 2023-03-12 ENCOUNTER — Inpatient Hospital Stay: Payer: Medicaid Other

## 2023-03-12 VITALS — BP 163/96 | HR 66 | Temp 96.8°F | Wt 316.8 lb

## 2023-03-12 DIAGNOSIS — D509 Iron deficiency anemia, unspecified: Secondary | ICD-10-CM | POA: Insufficient documentation

## 2023-03-12 DIAGNOSIS — R9389 Abnormal findings on diagnostic imaging of other specified body structures: Secondary | ICD-10-CM

## 2023-03-12 DIAGNOSIS — N920 Excessive and frequent menstruation with regular cycle: Secondary | ICD-10-CM | POA: Diagnosis not present

## 2023-03-12 DIAGNOSIS — D500 Iron deficiency anemia secondary to blood loss (chronic): Secondary | ICD-10-CM

## 2023-03-12 DIAGNOSIS — R102 Pelvic and perineal pain: Secondary | ICD-10-CM | POA: Diagnosis not present

## 2023-03-12 DIAGNOSIS — R5383 Other fatigue: Secondary | ICD-10-CM | POA: Diagnosis not present

## 2023-03-12 NOTE — Progress Notes (Signed)
[facility]  [phone] [phone]  ID: [name] OB: 02-Sep-1981  MR#: [id number]  ION#:[id number]  Patient Care Team: Bary Leriche, MD as PCP - General (Internal Medicine) Parke Poisson, MD as PCP - Cardiology (Cardiology) Esmeralda Links Letitia Neri, MD (Otolaryngology)  REFERRING PROVIDER: Dr. Marvell Fuller  REASON FOR REFERRAL: Iron deficiency anemia  HPI: [name] is a 42 y.o. female with past medical history of IDA, COVID, nephrolithiasis, gestational diabetes was referred to hematology for management of iron deficiency anemia.  Patient reports feeling very fatigued and brain fog.  Over the past year, she has been getting frequent infections and takes about 1 to 2 months to recover.  Has heavy menstrual periods for past 3 years.  Follows with OB/GYN.  Transvaginal ultrasound (02/17/2023) showed thickened endometrium but was thought could be related to cycle.  Was advised to repeat ultrasound.  But now is seeing a different OB for pelvic pain and will be undergoing hormone testing.  Pelvic pain is ongoing for the past 1 year after she had D&C for miscarriage.  Denies any bleeding in urine or stool.  Occasional NSAID use.  Denies any gastric bypass surgery.  Eating iron rich food.  Has 7 children.  Took iron pills could not tolerate due to upset stomach.  Recent labs from 03/09/2023 reviewed.  Hemoglobin 9.5, ferritin 11 and saturation 4% (scanned in media section)  REVIEW OF SYSTEMS:   ROS  As per HPI. Otherwise, a complete review of systems is negative.  PAST MEDICAL HISTORY: Past Medical History:  Diagnosis Date   Allergic rhinitis 09/06/2015   Anemia    Anginal pain (HCC)    Anxiety    COVID    1st time 2021, 2nd time this year 2023  Pt states she was not hospitalized. States she was pretty sick   Deaf, left    Depression    Excess, menstruation 09/06/2015   Gestational diabetes 2008   glyburide this  pregnancy   History of chicken pox    History of COVID-19 03/11/2020   April 2021   History of gestational diabetes 04/16/2016   Early a1c neg   History of kidney stones    Hydronephrosis, right 11/08/2012   Insomnia 09/06/2015   Kidney stones 2013   13 passed   Nephrolithiasis 11/29/2012   Pregnancy induced hypertension    labetolol   Preterm labor    delivery    PAST SURGICAL HISTORY: Past Surgical History:  Procedure Laterality Date   CESAREAN SECTION  2006   DILATION AND EVACUATION N/A 03/09/2022   Procedure: DILATATION AND EVACUATION;  Surgeon: Hermina Staggers, MD;  Location: [facility] OR;  Service: Gynecology;  Laterality: N/A;   WISDOM TOOTH EXTRACTION     x4    FAMILY HISTORY: Family History  Problem Relation Age of Onset   Schizophrenia Maternal Grandmother    Atrial fibrillation Mother    Depression Mother    Anxiety disorder Mother    Mental illness Father    Heart attack Maternal Grandfather    Atrial fibrillation Paternal Grandfather    Diabetes Paternal Grandfather    Heart attack Paternal Grandfather    Allergies Sister     HEALTH MAINTENANCE: Social History   Tobacco Use   Smoking status: Never    Passive exposure: Never   Smokeless tobacco: Never  Vaping Use   Vaping Use: Never used  Substance Use Topics   Alcohol use: No    Alcohol/week: 0.0  standard drinks of alcohol   Drug use: No     Allergies  Allergen Reactions   Iodine Anaphylaxis   Ivp Dye [Iodinated Contrast Media] Anaphylaxis   Phenergan Fortis [Promethazine] Itching and Other (See Comments)    "Itching/pins and needles for hours"    Current Outpatient Medications  Medication Sig Dispense Refill   Desiccated Beef Liver POWD by Does not apply route.     doxycycline (VIBRAMYCIN) 100 MG capsule Take 1 capsule (100 mg total) by mouth 2 (two) times daily. 20 capsule 0   ivermectin (STROMECTOL) 3 MG TABS tablet Take by mouth once.     Multiple Vitamins-Minerals (MULTIVITAMIN WITH  MINERALS) tablet Take 1 tablet by mouth daily.     prednisoLONE 5 MG TABS tablet Take by mouth.     No current facility-administered medications for this visit.    OBJECTIVE: Vitals:   03/12/23 1458  BP: (!) 163/96  Pulse: 66  Temp: (!) 96.8 F (36 C)  SpO2: 99%     Body mass index is 41.8 kg/m.      General: Well-developed, well-nourished, no acute distress. Eyes: Pink conjunctiva, anicteric sclera. HEENT: Normocephalic, moist mucous membranes, clear oropharnyx. Lungs: Clear to auscultation bilaterally. Heart: Regular rate and rhythm. No rubs, murmurs, or gallops. Abdomen: Soft, nontender, nondistended. No organomegaly noted, normoactive bowel sounds. Musculoskeletal: No edema, cyanosis, or clubbing. Neuro: Alert, answering all questions appropriately. Cranial nerves grossly intact. Skin: No rashes or petechiae noted. Psych: Normal affect. Lymphatics: No cervical, calvicular, axillary or inguinal LAD.   LAB RESULTS:  Lab Results  Component Value Date   NA 140 11/26/2020   K 4.0 11/26/2020   CL 104 11/26/2020   CO2 26 11/26/2020   GLUCOSE 103 (H) 11/26/2020   BUN 12 11/26/2020   CREATININE 0.95 11/26/2020   CALCIUM 9.1 11/26/2020   PROT 5.2 (L) 03/31/2020   ALBUMIN 2.5 (L) 03/31/2020   AST 15 03/31/2020   ALT 12 03/31/2020   ALKPHOS 72 03/31/2020   BILITOT 0.5 03/31/2020   GFRNONAA >60 11/26/2020   GFRAA 121 11/26/2020    Lab Results  Component Value Date   WBC 5.4 03/09/2022   NEUTROABS 3.6 11/25/2020   HGB 9.2 (L) 03/09/2022   HCT 32.0 (L) 03/09/2022   MCV 74.6 (L) 03/09/2022   PLT 247 03/09/2022    Lab Results  Component Value Date   TIBC 405 11/25/2020   FERRITIN 8 (L) 11/25/2020   IRONPCTSAT 7 (LL) 11/25/2020     STUDIES: US PELVIC COMPLETE WITH TRANSVAGINAL  Result Date: 02/17/2023 CLINICAL DATA:  Pelvic pain, diffuse pain since D&C on 03/09/2022, LMP 01/29/2023 EXAM: TRANSABDOMINAL AND TRANSVAGINAL ULTRASOUND OF PELVIS TECHNIQUE: Both  transabdominal and transvaginal ultrasound examinations of the pelvis were performed. Transabdominal technique was performed for global imaging of the pelvis including uterus, ovaries, adnexal regions, and pelvic cul-de-sac. It was necessary to proceed with endovaginal exam following the transabdominal exam to visualize the lower uterine segment/cervix and ovaries. COMPARISON:  None Available. FINDINGS: Uterus Measurements: 12.4 x 6.4 x 7.2 cm = volume: 297 mL. Anteverted. Anterior wall Caesarean section scar. Heterogeneous myometrium. No discrete mass. Endometrium Thickness: 15 mm. Thickened, heterogeneous, irregular, question containing hypoechoic central material complex fluid or blood versus heterogeneous increased endometrial thickening of 21 mm. No discrete mass. Right ovary Not visualized, likely obscured by bowel Left ovary Measurements: 3.1 x 2.5 x 3.1 cm = volume: 12.5 mL. Normal morphology without mass Other findings Trace nonspecific free pelvic fluid.  No adnexal masses.  IMPRESSION: Nonvisualization of RIGHT ovary. Thickened heterogeneous irregular endometrial complex 15-21 mm thick questionably containing complex fluid; no discrete endometrial mass/polyp. Endometrial thickness is considered abnormal. Consider follow-up by [place] in 6-8 weeks, during the week immediately following menses (exam timing is critical). Remainder of exam unremarkable. Electronically Signed   By: Ulyses Southward M.D.   On: 02/17/2023 15:13    ASSESSMENT AND PLAN:   [name] is a 42 y.o. female with pmh of IDA, COVID, nephrolithiasis, gestational diabetes was referred to hematology for management of iron deficiency anemia.  # Iron deficiency anemia -Progressive.  Likely secondary to heavy menstrual cycles.  Follows with OB/GYN.  Will be undergoing hormone testing. -Prior has tried oral iron could not tolerate due to upset stomach.  Discussed about IV Venofer 200 mg weekly x 5 doses.  Occasional side effects such as nausea  and back pain was discussed.  Rarely could cause allergic reaction.  Denies any bleeding in urine or stool.  -Patient prefers to get repeat blood work in 4 months with his primary.  Prescription was provided.  Schedule for IV Venofer 200 mg weekly x 5 doses. RTC in 4 months for MD visit, labs  Patient expressed understanding and was in agreement with this plan. She also understands that She can call clinic at any time with any questions, concerns, or complaints.   I spent a total of 45 minutes reviewing chart data, face-to-face evaluation with the patient, counseling and coordination of care as detailed above.  Michaelyn Barter, MD   03/12/2023 3:17 PM

## 2023-03-12 NOTE — Progress Notes (Signed)
Pt started having low iron for years now. Now pt is starting to get sick and is now affecting her daily life.  Has umbilical hernia. Pelvic pain-she rates it at a 6, this pain comes and goes.

## 2023-03-18 ENCOUNTER — Inpatient Hospital Stay: Payer: Medicaid Other

## 2023-03-18 VITALS — BP 141/81 | HR 67 | Temp 97.8°F | Resp 18

## 2023-03-18 DIAGNOSIS — D500 Iron deficiency anemia secondary to blood loss (chronic): Secondary | ICD-10-CM

## 2023-03-18 DIAGNOSIS — N920 Excessive and frequent menstruation with regular cycle: Secondary | ICD-10-CM | POA: Diagnosis not present

## 2023-03-18 MED ORDER — SODIUM CHLORIDE 0.9 % IV SOLN
200.0000 mg | Freq: Once | INTRAVENOUS | Status: AC
  Administered 2023-03-18: 200 mg via INTRAVENOUS
  Filled 2023-03-18: qty 200

## 2023-03-18 MED ORDER — SODIUM CHLORIDE 0.9 % IV SOLN
Freq: Once | INTRAVENOUS | Status: AC
  Filled 2023-03-18: qty 250

## 2023-03-18 NOTE — Patient Instructions (Signed)

## 2023-03-24 MED FILL — Iron Sucrose Inj 20 MG/ML (Fe Equiv): INTRAVENOUS | Qty: 10 | Status: AC

## 2023-03-25 ENCOUNTER — Emergency Department: Payer: Medicaid Other

## 2023-03-25 ENCOUNTER — Encounter: Payer: Self-pay | Admitting: Emergency Medicine

## 2023-03-25 ENCOUNTER — Encounter: Payer: Self-pay | Admitting: Internal Medicine

## 2023-03-25 ENCOUNTER — Emergency Department
Admission: EM | Admit: 2023-03-25 | Discharge: 2023-03-25 | Disposition: A | Discharge status: Home / Self Care | Payer: Medicaid Other | Attending: Emergency Medicine | Admitting: Emergency Medicine

## 2023-03-25 ENCOUNTER — Inpatient Hospital Stay: Payer: Medicaid Other | Attending: Internal Medicine

## 2023-03-25 ENCOUNTER — Other Ambulatory Visit: Payer: Self-pay

## 2023-03-25 VITALS — BP 183/110 | HR 122 | Temp 98.0°F | Resp 19

## 2023-03-25 DIAGNOSIS — T454X5A Adverse effect of iron and its compounds, initial encounter: Secondary | ICD-10-CM | POA: Insufficient documentation

## 2023-03-25 DIAGNOSIS — R202 Paresthesia of skin: Secondary | ICD-10-CM | POA: Diagnosis present

## 2023-03-25 DIAGNOSIS — R55 Syncope and collapse: Secondary | ICD-10-CM | POA: Diagnosis not present

## 2023-03-25 DIAGNOSIS — R0789 Other chest pain: Secondary | ICD-10-CM | POA: Diagnosis not present

## 2023-03-25 DIAGNOSIS — I10 Essential (primary) hypertension: Secondary | ICD-10-CM | POA: Insufficient documentation

## 2023-03-25 DIAGNOSIS — R000 Tachycardia, unspecified: Secondary | ICD-10-CM | POA: Diagnosis not present

## 2023-03-25 DIAGNOSIS — D500 Iron deficiency anemia secondary to blood loss (chronic): Secondary | ICD-10-CM | POA: Insufficient documentation

## 2023-03-25 DIAGNOSIS — N920 Excessive and frequent menstruation with regular cycle: Secondary | ICD-10-CM | POA: Diagnosis present

## 2023-03-25 DIAGNOSIS — T7840XA Allergy, unspecified, initial encounter: Secondary | ICD-10-CM | POA: Diagnosis not present

## 2023-03-25 DIAGNOSIS — T887XXA Unspecified adverse effect of drug or medicament, initial encounter: Secondary | ICD-10-CM | POA: Diagnosis not present

## 2023-03-25 LAB — BASIC METABOLIC PANEL
Anion gap: 11 (ref 5–15)
BUN: 15 mg/dL (ref 6–20)
CO2: 21 mmol/L — ABNORMAL LOW (ref 22–32)
Calcium: 8.8 mg/dL — ABNORMAL LOW (ref 8.9–10.3)
Chloride: 107 mmol/L (ref 98–111)
Creatinine, Ser: 0.9 mg/dL (ref 0.44–1.00)
GFR, Estimated: >60 mL/min (ref >60)
Glucose, Bld: 105 mg/dL — ABNORMAL HIGH (ref 70–99)
Potassium: 3.4 mmol/L — ABNORMAL LOW (ref 3.5–5.1)
Sodium: 139 mmol/L (ref 135–145)

## 2023-03-25 LAB — CBC
HCT: 37.5 % (ref 36.0–46.0)
Hemoglobin: 11 g/dL — ABNORMAL LOW (ref 12.0–15.0)
MCH: 22.7 pg — ABNORMAL LOW (ref 26.0–34.0)
MCHC: 29.3 g/dL — ABNORMAL LOW (ref 30.0–36.0)
MCV: 77.5 fL — ABNORMAL LOW (ref 80.0–100.0)
Platelets: 282 10*3/uL (ref 150–400)
RBC: 4.84 MIL/uL (ref 3.87–5.11)
RDW: 17.2 % — ABNORMAL HIGH (ref 11.5–15.5)
WBC: 9.7 10*3/uL (ref 4.0–10.5)
nRBC: 0 % (ref 0.0–0.2)

## 2023-03-25 LAB — TROPONIN I (HIGH SENSITIVITY)
Troponin I (High Sensitivity): 8 ng/L (ref <18)
Troponin I (High Sensitivity): 16 ng/L (ref <18)

## 2023-03-25 MED ORDER — METHYLPREDNISOLONE 4 MG PO TBPK: ORAL_TABLET | ORAL | 0 refills | Status: DC

## 2023-03-25 MED ORDER — MEPERIDINE HCL 25 MG/ML IJ SOLN
12.5000 mg | Freq: Once | INTRAMUSCULAR | Status: AC
  Administered 2023-03-25: 12.5 mg via INTRAVENOUS
  Filled 2023-03-25: qty 1

## 2023-03-25 MED ORDER — ACETAMINOPHEN 500 MG PO TABS
1000.0000 mg | ORAL_TABLET | Freq: Once | ORAL | Status: DC
  Filled 2023-03-25: qty 2

## 2023-03-25 MED ORDER — SODIUM CHLORIDE 0.9 % IV BOLUS
1000.0000 mL | Freq: Once | INTRAVENOUS | Status: AC
  Administered 2023-03-25: 1000 mL via INTRAVENOUS

## 2023-03-25 MED ORDER — ALPRAZOLAM 0.5 MG PO TABS
1.0000 mg | ORAL_TABLET | Freq: Once | ORAL | Status: AC
  Administered 2023-03-25: 1 mg via ORAL
  Filled 2023-03-25: qty 2

## 2023-03-25 MED ORDER — PREDNISONE 20 MG PO TABS
60.0000 mg | ORAL_TABLET | Freq: Once | ORAL | Status: AC
  Administered 2023-03-25: 60 mg via ORAL
  Filled 2023-03-25: qty 3

## 2023-03-25 MED ORDER — OXYCODONE-ACETAMINOPHEN 5-325 MG PO TABS
1.0000 | ORAL_TABLET | Freq: Once | ORAL | Status: AC
  Administered 2023-03-25: 1 via ORAL
  Filled 2023-03-25: qty 1

## 2023-03-25 MED ORDER — DIPHENHYDRAMINE HCL 50 MG/ML IJ SOLN
50.0000 mg | Freq: Once | INTRAMUSCULAR | Status: AC | PRN
  Administered 2023-03-25: 25 mg via INTRAVENOUS

## 2023-03-25 MED ORDER — DIPHENHYDRAMINE HCL 50 MG/ML IJ SOLN
25.0000 mg | Freq: Once | INTRAMUSCULAR | Status: AC
  Administered 2023-03-25: 25 mg via INTRAVENOUS

## 2023-03-25 MED ORDER — SODIUM CHLORIDE 0.9 % IV SOLN
Freq: Once | INTRAVENOUS | Status: AC | PRN
  Filled 2023-03-25: qty 250

## 2023-03-25 MED ORDER — SODIUM CHLORIDE 0.9 % IV SOLN
200.0000 mg | Freq: Once | INTRAVENOUS | Status: AC
  Administered 2023-03-25: 200 mg via INTRAVENOUS
  Filled 2023-03-25: qty 200

## 2023-03-25 MED ORDER — METHYLPREDNISOLONE SODIUM SUCC 125 MG IJ SOLR
125.0000 mg | Freq: Once | INTRAMUSCULAR | Status: AC | PRN
  Administered 2023-03-25: 125 mg via INTRAVENOUS

## 2023-03-25 MED ORDER — SODIUM CHLORIDE 0.9 % IV SOLN
Freq: Once | INTRAVENOUS | Status: AC
  Filled 2023-03-25: qty 250

## 2023-03-25 MED ORDER — FAMOTIDINE IN NACL 20-0.9 MG/50ML-% IV SOLN
20.0000 mg | Freq: Once | INTRAVENOUS | Status: AC | PRN
  Administered 2023-03-25: 20 mg via INTRAVENOUS

## 2023-03-25 NOTE — ED Provider Notes (Signed)
[facility] Provider Note    Event Date/Time   First MD Initiated Contact with Patient 03/25/23 1742     (approximate)   History   Allergic Reaction and Chest Pain   HPI  [name] is a 42 y.o. female with a history of hypertension, chronic fatigue, iron deficiency anemia, anxiety and as listed in EMR presents to the emergency department for treatment and evaluation after experiencing numbness and tingling in her lips and feeling jittery and had a near syncopal episode at the cancer center after finishing her iron infusion.  She did not have any reaction to the first 2 infusions.  She continues to feel jittery and sees some spots in front of her eyes.  She states that the near syncopal events come in waves.  She denies history of vertigo.  She does not feel that this is  a panic attack or secondary to anxiety.     Physical Exam   Triage Vital Signs: ED Triage Vitals  Enc Vitals Group     BP 03/25/23 1706 (!) 189/100     Pulse Rate 03/25/23 1706 (!) 119     Resp 03/25/23 1706 20     Temp 03/25/23 1708 98 F (36.7 C)     Temp Source 03/25/23 1708 Oral     SpO2 03/25/23 1706 98 %     Weight 03/25/23 1706 (!) 316 lb 12.8 oz (143.7 kg)     Height --      Head Circumference --      Peak Flow --      Pain Score 03/25/23 1706 2     Pain Loc --      Pain Edu? --      Excl. in GC? --     Most recent vital signs: Vitals:   03/25/23 2045 03/25/23 2055  BP:    Pulse: 92 92  Resp: 17 17  Temp:    SpO2: 97% 98%    General: Awake, no distress.  CV:  Good peripheral perfusion.  Tachycardic Resp:  Normal effort.  Abd:  No distention.  Other:  No rash noted.   ED Results / Procedures / Treatments   Labs (all labs ordered are listed, but only abnormal results are displayed) Labs Reviewed  BASIC METABOLIC PANEL - Abnormal; Notable for the following components:      Result Value   Potassium 3.4 (*)    CO2 21 (*)    Glucose, Bld 105 (*)     Calcium 8.8 (*)    All other components within normal limits  CBC - Abnormal; Notable for the following components:   Hemoglobin 11.0 (*)    MCV 77.5 (*)    MCH 22.7 (*)    MCHC 29.3 (*)    RDW 17.2 (*)    All other components within normal limits  POC URINE PREG, ED  TROPONIN I (HIGH SENSITIVITY)  TROPONIN I (HIGH SENSITIVITY)     EKG  Sinus tachycardia, rate of 124   RADIOLOGY  Image and radiology report reviewed and interpreted by me. Radiology report consistent with the same.  Patient declined chest x-ray.  PROCEDURES:  Critical Care performed: No  Procedures   MEDICATIONS ORDERED IN ED:  Medications  acetaminophen (TYLENOL) tablet 1,000 mg (1,000 mg Oral Patient Refused/Not Given 03/25/23 2011)  sodium chloride 0.9 % bolus 1,000 mL (0 mLs Intravenous Stopped 03/25/23 2108)  ALPRAZolam (XANAX) tablet 1 mg (1 mg Oral Given 03/25/23 1842)  predniSONE (DELTASONE) tablet 60 mg (60 mg Oral Given 03/25/23 2009)  oxyCODONE-acetaminophen (PERCOCET/ROXICET) 5-325 MG per tablet 1 tablet (1 tablet Oral Given 03/25/23 2108)     IMPRESSION / MDM / ASSESSMENT AND PLAN / ED COURSE   I have reviewed the triage note.  Differential diagnosis includes, but is not limited to, allergic reaction, anxiety  Patient's presentation is most consistent with acute presentation with potential threat to life or bodily function.  Patient on cardiac monitor to evaluate for rhythm change.  42 year old female presenting to the emergency department for treatment and evaluation after iron infusion.  See HPI for further details.  Prior to arrival, the cancer center started the HSR protocols and gave total of 50 mg of Benadryl, 125 mg of Solu-Medrol, 20 mg of Pepcid and a total of 25 mg of Demerol.   CBC is overall reassuring.  Normal white blood cell count. BMP essentially unremarkable as well. Initial troponin is 8.  ----------------------------------------- 6:42 PM on  03/25/2023 -----------------------------------------  Patient reports that she is having some difficulty just calming down.  She continues to have some of the tingling sensation in her lips.  She is not having any difficulty speaking and she is controlling her secretions.  Alprazolam ordered to help with component of anxiety.  IV fluids continue to infuse.  She will continue to be monitored closely.  ----------------------------------------- 8:09 PM on 03/25/2023 -----------------------------------------  Patient still has sensation of lip tingling.  Oral prednisone ordered.  No change in assessment.  Second troponin is normal.  Will continue to monitor.  ----------------------------------------- 9:08 PM on 03/25/2023 -----------------------------------------  Patient reports that the sensation of tingling and swelling of her lips and face has resolved.  She feels ready for discharge.  She is having some generalized muscle pain after rigors and is requesting a pain medication just for tonight so that she can rest.  Percocet ordered prior to discharge.  Strict ER return precautions were given.     FINAL CLINICAL IMPRESSION(S) / ED DIAGNOSES   Final diagnoses:  Adverse effect of iron, initial encounter     Rx / DC Orders   ED Discharge Orders          Ordered    methylPREDNISolone (MEDROL DOSEPAK) 4 MG TBPK tablet        03/25/23 2057             Note:  This document was prepared using Dragon voice recognition software and may include unintentional dictation errors.   Chinita Pester, FNP 03/25/23 1610    Minna Antis, MD 03/26/23 2306

## 2023-03-25 NOTE — Patient Instructions (Signed)

## 2023-03-25 NOTE — ED Notes (Signed)
Fluids not completed. NP aware.

## 2023-03-25 NOTE — Discharge Instructions (Addendum)
Please follow up with Dr. Alena Bills to discuss further treatment for iron deficiency.  Return to the ER immediately if symptoms return.

## 2023-03-25 NOTE — ED Triage Notes (Signed)
Pt was at cancer center receiving iron infusion. After completed the infusion pt started to feel numbness in lips, chest pain and tremors in hand and feet.  Pt received 50mg  benadryl, 25 demerol and 125mg  solumedrol.

## 2023-03-25 NOTE — Progress Notes (Unsigned)
Hypersensitivity Reaction note  Date of event: 03/25/23  Time of event: 1600  Generic name of drug involved: Venofer  Name of provider notified of the hypersensitivity reaction:  Dr Alena Bills   Was agent that likely caused hypersensitivity reaction added to Allergies List within EMR? Yes  Chain of events including reaction signs/symptoms, treatment administered, and outcome (e.g., drug resumed; drug discontinued; sent to Emergency Department; etc.):   Venofer stopped at 1535, pt observed for 30 minutes post infusion per protocol. Upon discharge, pt expressed to RN that her lips and arms felt tingling and numbed. **See flowsheet for vitals and MAR for administration times** Dr Alena Bills notified, 1 L of NS started, a total of 50 mg Benadryl IV, 125 mg Solumedrol IV, and 20 mg of pepcid IV given. At 1612 pt stated "I am having intermittent chest tightness, my chest does not feel right, and feels like I am going to pass out." New IV site started. Rigors developed at 1615 a total of 25 mg IV demerol was given per MD order. Per pt at 1620 no improvement, still feels numbness in her hands and feet, and still feels like she is going to pass out. Pt.'s appearance is now pale and rigors are still present. 1630 chest tightness improving, now having pain under her right breast. 1637 pt states "chest tightness and rigors feel like it is coming in waves." Per MD no new medications to give at this time.1640 EMS arrives, report given to EMS. Pt.'s son Aiden present at chairside, all belongings given to son upon EMS transporting pt to ER.   Sharyon Medicus, RN 03/25/2023 5:04 PM

## 2023-03-25 NOTE — ED Triage Notes (Signed)
Pt refused chest xray. Pt states I know it is protocol but I am just having an allergic reaction and I do not want to pay for chest xray.

## 2023-03-25 NOTE — ED Notes (Signed)
Pt verbalizes understanding of discharge instructions. Opportunity for questioning and answers were provided. Pt discharged from ED to home with family.    

## 2023-03-26 ENCOUNTER — Encounter: Payer: Self-pay | Admitting: Internal Medicine

## 2023-03-26 NOTE — Telephone Encounter (Signed)
Please cancel I have already notified patient

## 2023-03-28 ENCOUNTER — Encounter: Payer: Self-pay | Admitting: Internal Medicine

## 2023-03-31 MED FILL — Iron Sucrose Inj 20 MG/ML (Fe Equiv): INTRAVENOUS | Qty: 10 | Status: AC

## 2023-04-01 ENCOUNTER — Inpatient Hospital Stay: Payer: Medicaid Other

## 2023-04-08 ENCOUNTER — Inpatient Hospital Stay: Payer: Medicaid Other

## 2023-04-13 ENCOUNTER — Encounter: Payer: Self-pay | Admitting: Internal Medicine

## 2023-04-14 ENCOUNTER — Encounter: Payer: Self-pay | Admitting: Internal Medicine

## 2023-04-15 ENCOUNTER — Inpatient Hospital Stay: Payer: Medicaid Other

## 2023-04-16 ENCOUNTER — Ambulatory Visit: Admission: RE | Admit: 2023-04-16 | Payer: Self-pay | Source: Ambulatory Visit

## 2023-05-20 ENCOUNTER — Encounter: Payer: Self-pay | Admitting: Internal Medicine

## 2023-06-14 ENCOUNTER — Encounter: Payer: Self-pay | Admitting: Internal Medicine

## 2023-07-06 ENCOUNTER — Encounter: Payer: Self-pay | Admitting: Internal Medicine

## 2023-07-13 ENCOUNTER — Inpatient Hospital Stay: Payer: Medicaid Other | Attending: Internal Medicine | Admitting: Internal Medicine

## 2023-07-13 ENCOUNTER — Encounter: Payer: Self-pay | Admitting: Internal Medicine

## 2023-08-16 ENCOUNTER — Telehealth: Payer: Self-pay

## 2023-08-16 ENCOUNTER — Ambulatory Visit: Payer: Medicaid Other | Admitting: Family Medicine

## 2023-08-16 VITALS — BP 168/103 | HR 90 | Ht 73.0 in | Wt 312.0 lb

## 2023-08-16 DIAGNOSIS — N920 Excessive and frequent menstruation with regular cycle: Secondary | ICD-10-CM

## 2023-08-16 DIAGNOSIS — I10 Essential (primary) hypertension: Secondary | ICD-10-CM

## 2023-08-16 MED ORDER — NORETHINDRONE ACETATE 5 MG PO TABS
5.0000 mg | ORAL_TABLET | Freq: Every day | ORAL | 2 refills | Status: AC
Start: 2023-08-16 — End: ?

## 2023-08-16 NOTE — Assessment & Plan Note (Signed)
Likely needs her BP treated.

## 2023-08-16 NOTE — Progress Notes (Signed)
   Subjective:    Patient ID: [name] is a 42 y.o. female presenting with No chief complaint on file.  on 08/16/2023  HPI: Has had some reaction to iron infusion.  She had an ultrasound that showed a thickened endometrium. Having heavy vaginal bleeding monthly. This keeps her at home.   Review of Systems  Constitutional:  Negative for chills and fever.  Respiratory:  Negative for shortness of breath.   Cardiovascular:  Negative for chest pain.  Gastrointestinal:  Negative for abdominal pain, nausea and vomiting.  Genitourinary:  Negative for dysuria.  Skin:  Negative for rash.      Objective:    BP (!) 168/103   Pulse 90   Wt (!) 312 lb (141.5 kg)   LMP 07/15/2023   BMI 41.16 kg/m  Physical Exam Exam conducted with a chaperone present.  Constitutional:      General: She is not in acute distress.    Appearance: She is well-developed.  HENT:     Head: Normocephalic and atraumatic.  Eyes:     General: No scleral icterus. Cardiovascular:     Rate and Rhythm: Normal rate.  Pulmonary:     Effort: Pulmonary effort is normal.  Abdominal:     Palpations: Abdomen is soft.  Musculoskeletal:     Cervical back: Neck supple.  Skin:    General: Skin is warm and dry.  Neurological:     Mental Status: She is alert and oriented to person, place, and time.         Assessment & Plan:   Problem List Items Addressed This Visit       Medium    Benign essential hypertension    Likely needs her BP treated.        Unprioritized   Heavy menstrual period - Primary    Patient is having bleeding. U/s previously showed thickened lining. No f/u done. Declines EMB today. Needs pap as well. Suspect adenomyosis. She is wanting a possible endometrial ablation, as hysterectomy will not be done without EMB prior. Can get endometrial tissue following ablation.  Aygestin to help with bleeding. We should get pap as well.  She would need tubal or vasectomy. Has umbilical hernia,  will try to combine surgeries with able. BTL papers signed today. Patient counseled, r.e. Risks benefits of BTL, including permanency of procedure, discussed benefits of salpingectomy including almost no failure and prevention of ovarian cancer.   Patient verbalized understanding and desires to proceed Risks include but are not limited to bleeding, infection, injury to surrounding structures, including bowel, bladder and ureters, blood clots, and death.  Likelihood of success is high.       Relevant Medications   norethindrone (AYGESTIN) 5 MG tablet   Other Relevant Orders   Ambulatory referral to General Surgery   Ambulatory Referral For Surgery Scheduling    .  Return in about 3 months (around 11/16/2023) for postop check.  Reva Bores, MD 08/16/2023 3:03 PM

## 2023-08-16 NOTE — Assessment & Plan Note (Signed)
Patient is having bleeding. U/s previously showed thickened lining. No f/u done. Declines EMB today. Needs pap as well. Suspect adenomyosis. She is wanting a possible endometrial ablation, as hysterectomy will not be done without EMB prior. Can get endometrial tissue following ablation.  Aygestin to help with bleeding. We should get pap as well.  She would need tubal or vasectomy. Has umbilical hernia, will try to combine surgeries with able. BTL papers signed today. Patient counseled, r.e. Risks benefits of BTL, including permanency of procedure, discussed benefits of salpingectomy including almost no failure and prevention of ovarian cancer.   Patient verbalized understanding and desires to proceed Risks include but are not limited to bleeding, infection, injury to surrounding structures, including bowel, bladder and ureters, blood clots, and death.  Likelihood of success is high.

## 2023-08-16 NOTE — Telephone Encounter (Signed)
Called pt to see if she could come in at a sooner timer per provider.

## 2023-08-16 NOTE — Telephone Encounter (Signed)
Called patient to schedule surgery with Dr. Shawnie Pons on 08/30/23. Patient was checking her calendar and accidentally hung up or her phone died.

## 2023-08-17 ENCOUNTER — Telehealth: Payer: Self-pay

## 2023-08-17 NOTE — Telephone Encounter (Signed)
Patient called back to confirm she's available on 08/30/23 to have surgery with Dr. Shawnie Pons. Patient states she would like to have all 3 operative procedures completed. I advised the procedures would need to be completed at [facility] and require a second MD. The [facility] OR is currently booked on 08/30/23. I will look at OR availability and Dr. Tawni Levy schedule and call the patient back. Patient also states, Dr. Shawnie Pons advised if all 3 procedures were completed, she would stay overnight. Advised I would consult with Dr. Shawnie Pons.

## 2023-08-31 ENCOUNTER — Encounter: Payer: Self-pay | Admitting: Family Medicine

## 2023-09-02 ENCOUNTER — Telehealth: Payer: Self-pay

## 2023-09-02 NOTE — Telephone Encounter (Signed)
Reached out to patient to advise Dr. Shawnie Pons has an opening on 10/05/23 @[facility] . Dr. Shawnie Pons would be able to perform all procedures. Unable to leave a voicemail, mailbox was full.

## 2023-09-03 ENCOUNTER — Encounter: Payer: Self-pay | Admitting: Family Medicine

## 2023-09-03 ENCOUNTER — Telehealth: Payer: Self-pay

## 2023-09-03 NOTE — Telephone Encounter (Signed)
Patient agreed to have surgery w/ Dr. Shawnie Pons @[facility]  on 10/05/23 and would like to stay overnight. Patient asked to be scheduled at 9 am and will arrive at 7 am. Pre-op instructions and surgery details were provided over the phone.

## 2023-09-13 ENCOUNTER — Telehealth: Payer: Self-pay

## 2023-09-13 NOTE — Telephone Encounter (Signed)
Received a message stating may want to cancel 10/05/23 procedure w/ Dr. Shawnie Pons. Called her to validate information. Unable to leave voicemail, mailbox was full.

## 2023-10-05 ENCOUNTER — Encounter (HOSPITAL_BASED_OUTPATIENT_CLINIC_OR_DEPARTMENT_OTHER): Payer: Self-pay

## 2023-10-05 ENCOUNTER — Ambulatory Visit (HOSPITAL_BASED_OUTPATIENT_CLINIC_OR_DEPARTMENT_OTHER): Admit: 2023-10-05 | Payer: Medicaid Other | Admitting: Family Medicine

## 2023-10-05 SURGERY — DILATATION & CURETTAGE/HYSTEROSCOPY WITH NOVASURE ABLATION
Anesthesia: Choice

## 2023-11-01 ENCOUNTER — Encounter: Payer: Self-pay | Admitting: Family Medicine

## 2023-11-05 ENCOUNTER — Encounter: Payer: Self-pay | Admitting: Family Medicine

## 2023-11-05 ENCOUNTER — Telehealth: Payer: Self-pay

## 2023-11-05 DIAGNOSIS — Z302 Encounter for sterilization: Secondary | ICD-10-CM

## 2023-11-05 NOTE — H&P (Addendum)
[name] is an 43 y.o. [id number] female.   Chief Complaint: abnormal uterine bleeding, undesired fertility HPI: Having heavy menstrual bleeding. She is a grandmultipara with h/o C-section x 1. Suspect adenomyosis. Has thickened lining and declines in office biopsy.  Past Medical History:  Diagnosis Date   Allergic rhinitis 09/06/2015   Anemia    Anginal pain (HCC)    Anxiety    COVID    1st time 2021, 2nd time this year 2023  Pt states she was not hospitalized. States she was pretty sick   Deaf, left    Depression    Excess, menstruation 09/06/2015   Gestational diabetes 2008   glyburide this pregnancy   History of chicken pox    History of COVID-19 03/11/2020   April 2021   History of gestational diabetes 04/16/2016   Early a1c neg   History of kidney stones    Hydronephrosis, right 11/08/2012   Insomnia 09/06/2015   Kidney stones 2013   13 passed   Nephrolithiasis 11/29/2012   Pregnancy induced hypertension    labetolol   Preterm labor    delivery    Past Surgical History:  Procedure Laterality Date   CESAREAN SECTION  2006   DILATION AND EVACUATION N/A 03/09/2022   Procedure: DILATATION AND EVACUATION;  Surgeon: Hermina Staggers, MD;  Location: [facility];  Service: Gynecology;  Laterality: N/A;   WISDOM TOOTH EXTRACTION     x4    Family History  Problem Relation Age of Onset   Schizophrenia Maternal Grandmother    Atrial fibrillation Mother    Depression Mother    Anxiety disorder Mother    Mental illness Father    Heart attack Maternal Grandfather    Atrial fibrillation Paternal Grandfather    Diabetes Paternal Grandfather    Heart attack Paternal Grandfather    Allergies Sister    Social History:  reports that she has never smoked. She has never been exposed to tobacco smoke. She has never used smokeless tobacco. She reports that she does not drink alcohol and does not use drugs.  Allergies:  Allergies  Allergen Reactions   Iodine Anaphylaxis   Ivp  Dye [Iodinated Contrast Media] Anaphylaxis   Phenergan Fortis [Promethazine] Itching and Other (See Comments)    "Itching/pins and needles for hours"   Venofer [Iron Sucrose] Other (See Comments)    Patient had hypersensitivity reaction to Venofer. See progress note from 03/25/2023 at 1600.     No medications prior to admission.    Pertinent items are noted in HPI.  unknown if currently breastfeeding. There were no vitals taken for this visit. General appearance: alert, cooperative, and appears stated age Head: Normocephalic, without obvious abnormality, atraumatic Neck: supple, symmetrical, trachea midline Lungs:  normal effort Heart: regular rate and rhythm Abdomen: soft, non-tender; bowel sounds normal; no masses,  no organomegaly Extremities: extremities normal, atraumatic, no cyanosis or edema Skin: Skin color, texture, turgor normal. No rashes or lesions Neurologic: Grossly normal   No results found for this or any previous visit (from the past 24 hours). No results found.  Assessment/Plan Active Problems:   Iron deficiency anemia   Heavy menstrual period   Encounter for sterilization  For laparoscopic bilateral salpingectomy and HTA endometrial ablation. Patient counseled, r.e. Risks benefits of BTL, including permanency of procedure, discussed benefits of salpingectomy including almost no failure and prevention of ovarian cancer. Patient verbalized understanding and desires to proceed  Risks include but are not limited to bleeding, infection, injury  to surrounding structures, including bowel, bladder and ureters, blood clots, and death.  Likelihood of success is high.    Reva Bores 11/05/2023, 4:08 PM

## 2023-11-05 NOTE — Telephone Encounter (Signed)
Called patient to advise that Dr. Shawnie Pons was able to reschedule her surgery for 11/16/23 @[facility] . Patient needs to arrive by 1:30 and surgery starts at 3:30 pm. Call was forwarded to vmail and mailbox was full. I will send details via Mychart.

## 2023-11-15 ENCOUNTER — Encounter (HOSPITAL_BASED_OUTPATIENT_CLINIC_OR_DEPARTMENT_OTHER): Payer: Self-pay | Admitting: Family Medicine

## 2023-11-15 NOTE — Progress Notes (Signed)
Spoke w/ via phone for pre-op interview--- [name] needs dos---- CBC and UPT per anesthesia.        [name] results------Current EKG in Epic dated 03/26/23 COVID test -----patient states asymptomatic no test needed Arrive at ------- NPO after MN NO Solid Food.  Clear liquids from MN until---1230 Med rec completed Medications to take morning of surgery -----Xanax PRN Diabetic medication ----- Patient instructed no nail polish to be worn day of surgery Patient instructed to bring photo id and insurance card day of surgery Patient aware to have Driver (ride ) / caregiver    for 24 hours after surgery - Husband [name] Patient Special Instructions ----- Pre-Op special Instructions ----- Patient verbalized understanding of instructions that were given at this phone interview. Patient denies chest pain, sob, fever, cough at the interview.

## 2023-11-15 NOTE — H&P (View-Only) (Signed)
Spoke w/ via phone for pre-op interview--- [name] needs dos---- CBC and UPT per anesthesia.        [name] results------Current EKG in Epic dated 03/26/23 COVID test -----patient states asymptomatic no test needed Arrive at ------- NPO after MN NO Solid Food.  Clear liquids from MN until---1230 Med rec completed Medications to take morning of surgery -----Xanax PRN Diabetic medication ----- Patient instructed no nail polish to be worn day of surgery Patient instructed to bring photo id and insurance card day of surgery Patient aware to have Driver (ride ) / caregiver    for 24 hours after surgery - Husband [name] Patient Special Instructions ----- Pre-Op special Instructions ----- Patient verbalized understanding of instructions that were given at this phone interview. Patient denies chest pain, sob, fever, cough at the interview.

## 2023-11-16 ENCOUNTER — Ambulatory Visit (HOSPITAL_BASED_OUTPATIENT_CLINIC_OR_DEPARTMENT_OTHER): Payer: Medicaid Other | Admitting: Certified Registered Nurse Anesthetist

## 2023-11-16 ENCOUNTER — Ambulatory Visit (HOSPITAL_BASED_OUTPATIENT_CLINIC_OR_DEPARTMENT_OTHER)
Admission: RE | Admit: 2023-11-16 | Discharge: 2023-11-16 | Disposition: A | Discharge status: Home / Self Care | Payer: Medicaid Other | Attending: Family Medicine | Admitting: Family Medicine

## 2023-11-16 ENCOUNTER — Encounter: Payer: Self-pay | Admitting: Internal Medicine

## 2023-11-16 ENCOUNTER — Other Ambulatory Visit (HOSPITAL_COMMUNITY): Payer: Self-pay

## 2023-11-16 ENCOUNTER — Encounter (HOSPITAL_BASED_OUTPATIENT_CLINIC_OR_DEPARTMENT_OTHER)
Admission: RE | Disposition: A | Discharge status: Home / Self Care | Payer: Self-pay | Source: Home / Self Care | Attending: Family Medicine

## 2023-11-16 ENCOUNTER — Encounter (HOSPITAL_BASED_OUTPATIENT_CLINIC_OR_DEPARTMENT_OTHER): Payer: Self-pay | Admitting: Family Medicine

## 2023-11-16 ENCOUNTER — Other Ambulatory Visit: Payer: Self-pay

## 2023-11-16 DIAGNOSIS — N840 Polyp of corpus uteri: Secondary | ICD-10-CM | POA: Diagnosis not present

## 2023-11-16 DIAGNOSIS — D509 Iron deficiency anemia, unspecified: Secondary | ICD-10-CM | POA: Diagnosis not present

## 2023-11-16 DIAGNOSIS — Z302 Encounter for sterilization: Secondary | ICD-10-CM | POA: Insufficient documentation

## 2023-11-16 DIAGNOSIS — N938 Other specified abnormal uterine and vaginal bleeding: Secondary | ICD-10-CM | POA: Diagnosis not present

## 2023-11-16 DIAGNOSIS — Z01818 Encounter for other preprocedural examination: Secondary | ICD-10-CM

## 2023-11-16 DIAGNOSIS — I10 Essential (primary) hypertension: Secondary | ICD-10-CM | POA: Insufficient documentation

## 2023-11-16 DIAGNOSIS — E66813 Obesity, class 3: Secondary | ICD-10-CM | POA: Diagnosis not present

## 2023-11-16 DIAGNOSIS — N841 Polyp of cervix uteri: Secondary | ICD-10-CM

## 2023-11-16 DIAGNOSIS — F418 Other specified anxiety disorders: Secondary | ICD-10-CM

## 2023-11-16 DIAGNOSIS — Z6841 Body Mass Index (BMI) 40.0 and over, adult: Secondary | ICD-10-CM | POA: Diagnosis not present

## 2023-11-16 DIAGNOSIS — N920 Excessive and frequent menstruation with regular cycle: Secondary | ICD-10-CM | POA: Diagnosis present

## 2023-11-16 DIAGNOSIS — K219 Gastro-esophageal reflux disease without esophagitis: Secondary | ICD-10-CM | POA: Insufficient documentation

## 2023-11-16 HISTORY — PX: DILITATION & CURRETTAGE/HYSTROSCOPY WITH HYDROTHERMAL ABLATION: SHX5570

## 2023-11-16 HISTORY — PX: LAPAROSCOPIC BILATERAL SALPINGECTOMY: SHX5889

## 2023-11-16 LAB — CBC
HCT: 37.9 % (ref 36.0–46.0)
Hemoglobin: 11.5 g/dL — ABNORMAL LOW (ref 12.0–15.0)
MCH: 24.7 pg — ABNORMAL LOW (ref 26.0–34.0)
MCHC: 30.3 g/dL (ref 30.0–36.0)
MCV: 81.5 fL (ref 80.0–100.0)
Platelets: 295 10*3/uL (ref 150–400)
RBC: 4.65 MIL/uL (ref 3.87–5.11)
RDW: 15.9 % — ABNORMAL HIGH (ref 11.5–15.5)
WBC: 6.2 10*3/uL (ref 4.0–10.5)
nRBC: 0 % (ref 0.0–0.2)

## 2023-11-16 LAB — POCT PREGNANCY, URINE: Preg Test, Ur: NEGATIVE

## 2023-11-16 SURGERY — DILATATION & CURETTAGE/HYSTEROSCOPY WITH HYDROTHERMAL ABLATION
Anesthesia: General

## 2023-11-16 MED ORDER — LIDOCAINE 2% (20 MG/ML) 5 ML SYRINGE
INTRAMUSCULAR | Status: DC | PRN
  Administered 2023-11-16: 100 mg via INTRAVENOUS

## 2023-11-16 MED ORDER — FENTANYL CITRATE (PF) 250 MCG/5ML IJ SOLN
INTRAMUSCULAR | Status: DC | PRN
  Administered 2023-11-16: 25 ug via INTRAVENOUS
  Administered 2023-11-16: 50 ug via INTRAVENOUS
  Administered 2023-11-16: 25 ug via INTRAVENOUS
  Administered 2023-11-16 (×2): 50 ug via INTRAVENOUS

## 2023-11-16 MED ORDER — PHENYLEPHRINE 80 MCG/ML (10ML) SYRINGE FOR IV PUSH (FOR BLOOD PRESSURE SUPPORT)
PREFILLED_SYRINGE | INTRAVENOUS | Status: DC | PRN
  Administered 2023-11-16 (×2): 80 ug via INTRAVENOUS

## 2023-11-16 MED ORDER — OXYCODONE HCL 5 MG PO TABS
ORAL_TABLET | ORAL | Status: AC
  Filled 2023-11-16: qty 1

## 2023-11-16 MED ORDER — SUGAMMADEX SODIUM 200 MG/2ML IV SOLN
INTRAVENOUS | Status: DC | PRN
  Administered 2023-11-16: 300 mg via INTRAVENOUS

## 2023-11-16 MED ORDER — OXYCODONE HCL 5 MG PO TABS
5.0000 mg | ORAL_TABLET | Freq: Four times a day (QID) | ORAL | 0 refills | Status: AC | PRN
  Filled 2023-11-16: qty 8, 2d supply, fill #0

## 2023-11-16 MED ORDER — MIDAZOLAM HCL 2 MG/2ML IJ SOLN
INTRAMUSCULAR | Status: DC | PRN
  Administered 2023-11-16: 2 mg via INTRAVENOUS

## 2023-11-16 MED ORDER — CELECOXIB 200 MG PO CAPS
400.0000 mg | ORAL_CAPSULE | ORAL | Status: AC
  Administered 2023-11-16: 400 mg via ORAL

## 2023-11-16 MED ORDER — OXYCODONE HCL 5 MG PO TABS
5.0000 mg | ORAL_TABLET | Freq: Once | ORAL | Status: AC | PRN
  Administered 2023-11-16: 5 mg via ORAL

## 2023-11-16 MED ORDER — HYDROMORPHONE HCL 1 MG/ML IJ SOLN
0.2500 mg | INTRAMUSCULAR | Status: DC | PRN
  Administered 2023-11-16: 0.25 mg via INTRAVENOUS
  Administered 2023-11-16: 0.5 mg via INTRAVENOUS
  Administered 2023-11-16: 0.25 mg via INTRAVENOUS
  Administered 2023-11-16 (×2): 0.5 mg via INTRAVENOUS

## 2023-11-16 MED ORDER — ONDANSETRON HCL 4 MG/2ML IJ SOLN
INTRAMUSCULAR | Status: DC | PRN
  Administered 2023-11-16: 4 mg via INTRAVENOUS

## 2023-11-16 MED ORDER — ACETAMINOPHEN 500 MG PO TABS
ORAL_TABLET | ORAL | Status: AC
  Filled 2023-11-16: qty 2

## 2023-11-16 MED ORDER — CELECOXIB 200 MG PO CAPS
ORAL_CAPSULE | ORAL | Status: AC
  Filled 2023-11-16: qty 2

## 2023-11-16 MED ORDER — ROCURONIUM BROMIDE 10 MG/ML (PF) SYRINGE
PREFILLED_SYRINGE | INTRAVENOUS | Status: DC | PRN
  Administered 2023-11-16: 70 mg via INTRAVENOUS

## 2023-11-16 MED ORDER — PROPOFOL 10 MG/ML IV BOLUS
INTRAVENOUS | Status: AC
  Filled 2023-11-16: qty 20

## 2023-11-16 MED ORDER — ACETAMINOPHEN 500 MG PO TABS
1000.0000 mg | ORAL_TABLET | ORAL | Status: AC
  Administered 2023-11-16: 1000 mg via ORAL

## 2023-11-16 MED ORDER — HYDROMORPHONE HCL 1 MG/ML IJ SOLN
INTRAMUSCULAR | Status: AC
  Filled 2023-11-16: qty 1

## 2023-11-16 MED ORDER — BUPIVACAINE HCL (PF) 0.25 % IJ SOLN
INTRAMUSCULAR | Status: DC | PRN
  Administered 2023-11-16: 15 mL

## 2023-11-16 MED ORDER — AMISULPRIDE (ANTIEMETIC) 5 MG/2ML IV SOLN
10.0000 mg | Freq: Once | INTRAVENOUS | Status: AC | PRN
  Administered 2023-11-16: 10 mg via INTRAVENOUS

## 2023-11-16 MED ORDER — LACTATED RINGERS IV SOLN: INTRAVENOUS | Status: DC

## 2023-11-16 MED ORDER — DEXAMETHASONE SODIUM PHOSPHATE 10 MG/ML IJ SOLN
INTRAMUSCULAR | Status: DC | PRN
  Administered 2023-11-16: 10 mg via INTRAVENOUS

## 2023-11-16 MED ORDER — OXYCODONE HCL 5 MG/5ML PO SOLN: 5.0000 mg | Freq: Once | ORAL | Status: AC | PRN

## 2023-11-16 MED ORDER — GABAPENTIN 300 MG PO CAPS
ORAL_CAPSULE | ORAL | Status: AC
  Filled 2023-11-16: qty 1

## 2023-11-16 MED ORDER — IBUPROFEN 600 MG PO TABS
600.0000 mg | ORAL_TABLET | Freq: Four times a day (QID) | ORAL | 1 refills | Status: AC | PRN
  Filled 2023-11-16: qty 30, 8d supply, fill #0

## 2023-11-16 MED ORDER — LIDOCAINE HCL 1 % IJ SOLN
INTRAMUSCULAR | Status: DC | PRN
  Administered 2023-11-16: 20 mL

## 2023-11-16 MED ORDER — AMISULPRIDE (ANTIEMETIC) 5 MG/2ML IV SOLN
INTRAVENOUS | Status: AC
  Filled 2023-11-16: qty 4

## 2023-11-16 MED ORDER — PROPOFOL 10 MG/ML IV BOLUS
INTRAVENOUS | Status: DC | PRN
  Administered 2023-11-16: 200 mg via INTRAVENOUS

## 2023-11-16 MED ORDER — POVIDONE-IODINE 10 % EX SWAB: 2.0000 | Freq: Once | CUTANEOUS | Status: DC

## 2023-11-16 MED ORDER — MEPERIDINE HCL 25 MG/ML IJ SOLN: 6.2500 mg | INTRAMUSCULAR | Status: DC | PRN

## 2023-11-16 MED ORDER — SODIUM CHLORIDE 0.9 % IR SOLN
Status: DC | PRN
  Administered 2023-11-16: 6000 mL

## 2023-11-16 MED ORDER — MIDAZOLAM HCL 2 MG/2ML IJ SOLN
INTRAMUSCULAR | Status: AC
Start: 2023-11-16 — End: ?
  Filled 2023-11-16: qty 2

## 2023-11-16 MED ORDER — FENTANYL CITRATE (PF) 100 MCG/2ML IJ SOLN
INTRAMUSCULAR | Status: AC
  Filled 2023-11-16: qty 2

## 2023-11-16 MED ORDER — GABAPENTIN 300 MG PO CAPS
300.0000 mg | ORAL_CAPSULE | ORAL | Status: AC
  Administered 2023-11-16: 300 mg via ORAL

## 2023-11-16 SURGICAL SUPPLY — 43 items
BLADE SURG 15 STRL LF DISP TIS (BLADE) ×2 IMPLANT
CABLE HIGH FREQUENCY MONO STRZ (ELECTRODE) IMPLANT
CATH ROBINSON RED A/P 16FR (CATHETERS) ×2 IMPLANT
COVER MAYO STAND STRL (DRAPES) ×2 IMPLANT
DERMABOND ADVANCED .7 DNX12 (GAUZE/BANDAGES/DRESSINGS) ×2 IMPLANT
DILATOR CANAL MILEX (MISCELLANEOUS) IMPLANT
DRAPE SURG IRRIG POUCH 19X23 (DRAPES) ×2 IMPLANT
DRSG OPSITE POSTOP 3X4 (GAUZE/BANDAGES/DRESSINGS) IMPLANT
DRSG TELFA 3X8 NADH STRL (GAUZE/BANDAGES/DRESSINGS) ×2 IMPLANT
DURAPREP 26ML APPLICATOR (WOUND CARE) ×2 IMPLANT
GAUZE 4X4 16PLY ~~LOC~~+RFID DBL (SPONGE) ×4 IMPLANT
GLOVE BIOGEL PI IND STRL 7.0 (GLOVE) ×8 IMPLANT
GLOVE ECLIPSE 7.0 STRL STRAW (GLOVE) ×2 IMPLANT
GOWN STRL REUS W/TWL XL LVL3 (GOWN DISPOSABLE) ×4 IMPLANT
IRRIG SUCT STRYKERFLOW 2 WTIP (MISCELLANEOUS) ×2 IMPLANT
IRRIGATION SUCT STRKRFLW 2 WTP (MISCELLANEOUS) ×2 IMPLANT
IV NS IRRIG 3000ML ARTHROMATIC (IV SOLUTION) IMPLANT
KIT PINK PAD W/HEAD ARE REST (MISCELLANEOUS) ×2 IMPLANT
KIT PINK PAD W/HEAD ARM REST (MISCELLANEOUS) ×2 IMPLANT
KIT PROCEDURE FLUENT (KITS) IMPLANT
KIT TURNOVER CYSTO (KITS) ×2 IMPLANT
NDL SPNL 22GX3.5 QUINCKE BK (NEEDLE) IMPLANT
NEEDLE SPNL 22GX3.5 QUINCKE BK (NEEDLE) ×2 IMPLANT
NS IRRIG 1000ML POUR BTL (IV SOLUTION) ×2 IMPLANT
PACK LAPAROSCOPY BASIN (CUSTOM PROCEDURE TRAY) ×2 IMPLANT
PACK VAGINAL MINOR WOMEN LF (CUSTOM PROCEDURE TRAY) ×2 IMPLANT
PAD OB MATERNITY 4.3X12.25 (PERSONAL CARE ITEMS) ×2 IMPLANT
PAD PREP 24X48 CUFFED NSTRL (MISCELLANEOUS) ×2 IMPLANT
SEAL ROD LENS SCOPE MYOSURE (ABLATOR) ×2 IMPLANT
SET GENESYS HTA PROCERVA (MISCELLANEOUS) ×2 IMPLANT
SET TUBE SMOKE EVAC HIGH FLOW (TUBING) ×2 IMPLANT
SHEARS HARMONIC 36 ACE (MISCELLANEOUS) IMPLANT
SLEEVE SCD COMPRESS KNEE MED (STOCKING) ×4 IMPLANT
SUT VIC AB 3-0 X1 27 (SUTURE) ×2 IMPLANT
SUT VICRYL 0 UR6 27IN ABS (SUTURE) ×2 IMPLANT
SYR CONTROL 10ML LL (SYRINGE) IMPLANT
SYS BAG RETRIEVAL 10MM (BASKET) IMPLANT
SYSTEM BAG RETRIEVAL 10MM (BASKET) IMPLANT
TOWEL OR 17X24 6PK STRL BLUE (TOWEL DISPOSABLE) ×4 IMPLANT
TRAY FOLEY W/BAG SLVR 14FR LF (SET/KITS/TRAYS/PACK) IMPLANT
TROCAR BALLN 12MMX100 BLUNT (TROCAR) ×2 IMPLANT
TROCAR Z-THREAD FIOS 5X100MM (TROCAR) ×4 IMPLANT
WARMER LAPAROSCOPE (MISCELLANEOUS) ×2 IMPLANT

## 2023-11-16 NOTE — Anesthesia Procedure Notes (Signed)
Procedure Name: Intubation Date/Time: 11/16/2023 11:53 AM  Performed by: Dairl Ponder, CRNAPre-anesthesia Checklist: Patient identified, Emergency Drugs available, Suction available and Patient being monitored Patient Re-evaluated:Patient Re-evaluated prior to induction Oxygen Delivery Method: Circle System Utilized Preoxygenation: Pre-oxygenation with 100% oxygen Induction Type: IV induction Ventilation: Mask ventilation without difficulty Laryngoscope Size: Mac and 4 Grade View: Grade II Tube type: Oral Tube size: 7.5 mm Number of attempts: 1 Airway Equipment and Method: Stylet and Oral airway Placement Confirmation: ETT inserted through vocal cords under direct vision, positive ETCO2 and breath sounds checked- equal and bilateral Secured at: 24 cm Tube secured with: Tape Dental Injury: Teeth and Oropharynx as per pre-operative assessment

## 2023-11-16 NOTE — Anesthesia Preprocedure Evaluation (Signed)
Anesthesia Evaluation  Patient identified by MRN, date of birth, ID band Patient awake    Reviewed: Allergy & Precautions, NPO status , Patient's Chart, lab work & pertinent test results  Airway Mallampati: IV  TM Distance: >3 FB Neck ROM: Full    Dental no notable dental hx. (+) Teeth Intact, Dental Advisory Given   Pulmonary neg pulmonary ROS   Pulmonary exam normal breath sounds clear to auscultation       Cardiovascular hypertension, Pt. on medications Normal cardiovascular exam Rhythm:Regular Rate:Normal     Neuro/Psych  PSYCHIATRIC DISORDERS Anxiety Depression    negative neurological ROS     GI/Hepatic Neg liver ROS,GERD  Controlled,,  Endo/Other    Class 3 obesityBMI 40  Renal/GU negative Renal ROS  negative genitourinary   Musculoskeletal negative musculoskeletal ROS (+)    Abdominal  (+) + obese  Peds  Hematology negative hematology ROS (+)   Anesthesia Other Findings   Reproductive/Obstetrics negative OB ROS MAB 11weeks                             Anesthesia Physical Anesthesia Plan  ASA: 3  Anesthesia Plan: General   Post-op Pain Management: Tylenol PO (pre-op)*, Celebrex PO (pre-op)* and Gabapentin PO (pre-op)*   Induction: Intravenous  PONV Risk Score and Plan: 3 and Ondansetron, Dexamethasone, Midazolam and Treatment may vary due to age or medical condition  Airway Management Planned: Oral ETT  Additional Equipment: None  Intra-op Plan:   Post-operative Plan: Extubation in OR  Informed Consent: I have reviewed the patients History and Physical, chart, labs and discussed the procedure including the risks, benefits and alternatives for the proposed anesthesia with the patient or authorized representative who has indicated his/her understanding and acceptance.     Dental advisory given  Plan Discussed with: CRNA  Anesthesia Plan Comments:          Anesthesia Quick Evaluation

## 2023-11-16 NOTE — Discharge Instructions (Signed)
  Post Anesthesia Home Care Instructions  Activity: Get plenty of rest for the remainder of the day. A responsible individual must stay with you for 24 hours following the procedure.  For the next 24 hours, DO NOT: -Drive a car -Advertising copywriter -Drink alcoholic beverages -Take any medication unless instructed by your physician -Make any legal decisions or sign important papers.  Meals: Start with liquid foods such as gelatin or soup. Progress to regular foods as tolerated. Avoid greasy, spicy, heavy foods. If nausea and/or vomiting occur, drink only clear liquids until the nausea and/or vomiting subsides. Call your physician if vomiting continues.  Special Instructions/Symptoms: Your throat may feel dry or sore from the anesthesia or the breathing tube placed in your throat during surgery. If this causes discomfort, gargle with warm salt water. The discomfort should disappear within 24 hours.  No ibuprofen, Advil, Aleve, Motrin, ketorolac, meloxicam, naproxen, or other NSAIDS until after 5:30 pm today if needed. No acetaminophen/Tylenol until after 5:30 pm today if needed.

## 2023-11-16 NOTE — Interval H&P Note (Signed)
History and Physical Interval Note:  11/16/2023 11:26 AM  [name]  has presented today for surgery, with the diagnosis of Pelvic Pain Undesired fertility.  The various methods of treatment have been discussed with the patient and family. After consideration of risks, benefits and other options for treatment, the patient has consented to  Procedure(s): DILATATION & CURETTAGE/HYSTEROSCOPY WITH HYDROTHERMAL ABLATION (N/A) LAPAROSCOPIC BILATERAL SALPINGECTOMY (Bilateral) as a surgical intervention.  The patient's history has been reviewed, patient examined, no change in status, stable for surgery.  I have reviewed the patient's chart and labs.  Questions were answered to the patient's satisfaction.     Reva Bores

## 2023-11-16 NOTE — Op Note (Signed)
PROCEDURE DATE: 11/16/2023  PREOPERATIVE DIAGNOSES: Undesired fertility, abnormal uterine bleeding, screen for cervical cancer, endocervical polyp  POSTOPERATIVE DIAGNOSES: The same   PROCEDURE: Laparoscopic bilateral salpingectomy, pap smear, cervical polypectomy, dilation and curettage with HTA ablation.   SURGEON: Dr. Reva Bores   ASSISTANT: None  ANESTHESIOLOGIST: Lowella Curb, MD MD - GETT paracervical block  INDICATIONS: 43 y.o. [id number] who desired treatment for bleeding, needed pap smear, endometrial sampling and desired permanent sterility.  FINDINGS: Normal appearing uterus, tubes and ovaries, inflamed endocervical polyp   ESTIMATED BLOOD LOSS: 25 ml   SPECIMENS: Bilateral fallopian tubes to pathology, Pap smear, endometrial curetting, endocervical polyp.  COMPLICATIONS: None immediately known   PROCEDURE IN DETAIL: The patient had sequential compression devices applied to her lower extremities while in the preoperative area. She was then taken to the operating room where general anesthesia was administered and was found to be adequate. She was placed in the dorsal lithotomy position. Pap smear obtained prior to prep. She was prepped and draped in a sterile manner. A foley catheter was inserted into her bladder and drained a clear unknown amount of urine. A speculum was placed inside the vagina, and the cervix grasped with an single tooth tenaculum. An Acorn tenaculum was placed through the cervix for uterine manipulation. Attention was then turned to the patient's abdomen where a 5-mm skin incision was made in the midclavicular line in the LUQ.  The abdomen lifted and trocar placed under direct visualization. Initially intra-omental placement done but then Intra-peritoneal placement was confirmed and insufflation done. The bowel and omentum under placement appeared normal. No bleeding noted. A survey of the patient's pelvis and abdomen revealed the findings above. Two left  sided 5-mm lower quadrant ports were then placed under direct visualization. On the rightt side, the tube was separated from the mesosalpinx with the Harmonic device.  On the left side, the tube was separated from the mesosalpinx with the Harmonic device.  The specimen was then removed from the abdomen through the 5-mm port, under direct visualization. The operative site was surveyed, and found to be hemostatic. No intraoperative injury to other surrounding organs was noted. The abdomen was desufflated and all instruments were then removed from the patient's abdomen.  All skin incisions were closed with 3-0 Vicryl subcuticular stitches/Dermabond.   Attention was turned to the pelvis. A speculum was placed inside the vagina and the cervix visualized. Cervix visualized and polyp noted. Ring forcep applied to cervix and twisting motion removed polyp intact. The cervix was grasped anteriorly with a single-tooth tenaculum. 20 cc of quarter percent Marcaine were injected for paracervical block. Sequential dilation was done to a #19 dilator, and the HTA with hysteroscope was introduced into the uterine cavity. The cervix and endometrial lining appeared normal both ostia were seen there was no deformity of the cavity. A seal test was done and was passed. The uterine cavity was heated to between 80 and 90C and HTA was performed for 10 minutes. Cooling was then performed and the instrument removed. Sharp curettage was then performed and sample sent to pathology. All instrument, needle, and lap counts were correct x2. The patient was awakened taken to recovery room in stable condition.  Reva Bores MD 11/16/2023 1:09 PM

## 2023-11-16 NOTE — Transfer of Care (Signed)
Immediate Anesthesia Transfer of Care Note  Patient: Golden Emile Capote  Procedure(s) Performed: DILATATION & CURETTAGE/HYSTEROSCOPY WITH HYDROTHERMAL ABLATION EXCISION OF ENDOCERVICAL POLYP, PAPSMERE LAPAROSCOPIC BILATERAL SALPINGECTOMY (Bilateral)  Patient Location: PACU  Anesthesia Type:General  Level of Consciousness: awake, alert , and oriented  Airway & Oxygen Therapy: Patient Spontanous Breathing  Post-op Assessment: Report given to RN and Post -op Vital signs reviewed and stable  Post vital signs: Reviewed and stable  Last Vitals:  Vitals Value Taken Time  BP 140/84 11/16/23 1322  Temp    Pulse 97 11/16/23 1323  Resp 18 11/16/23 1323  SpO2 97 % 11/16/23 1323  Vitals shown include unfiled device data.  Last Pain:  Vitals:   11/16/23 1130  TempSrc: Oral  PainSc: 0-No pain      Patients Stated Pain Goal: 2 (11/16/23 1130)  Complications: No notable events documented.

## 2023-11-17 ENCOUNTER — Encounter: Payer: Self-pay | Admitting: Family Medicine

## 2023-11-17 ENCOUNTER — Encounter (HOSPITAL_BASED_OUTPATIENT_CLINIC_OR_DEPARTMENT_OTHER): Payer: Self-pay | Admitting: Family Medicine

## 2023-11-17 LAB — SURGICAL PATHOLOGY

## 2023-11-17 NOTE — Anesthesia Postprocedure Evaluation (Signed)
Anesthesia Post Note  Patient: [name]  Procedure(s) Performed: DILATATION & CURETTAGE/HYSTEROSCOPY WITH HYDROTHERMAL ABLATION EXCISION OF ENDOCERVICAL POLYP, PAPSMERE LAPAROSCOPIC BILATERAL SALPINGECTOMY (Bilateral)     Patient location during evaluation: PACU Anesthesia Type: General Level of consciousness: awake and alert Pain management: pain level controlled Vital Signs Assessment: post-procedure vital signs reviewed and stable Respiratory status: spontaneous breathing, nonlabored ventilation and respiratory function stable Cardiovascular status: blood pressure returned to baseline and stable Postop Assessment: no apparent nausea or vomiting Anesthetic complications: no   No notable events documented.  Last Vitals:  Vitals:   11/16/23 1445 11/16/23 1500  BP: (!) 152/99 (!) 157/90  Pulse: 79 80  Resp: 18 16  Temp:    SpO2: 97% 97%    Last Pain:  Vitals:   11/16/23 1548  TempSrc:   PainSc: 5                  Lowella Curb

## 2023-11-20 LAB — CYTOLOGY - PAP: Diagnosis: NEGATIVE

## 2023-11-23 ENCOUNTER — Encounter: Payer: Self-pay | Admitting: Internal Medicine

## 2023-12-06 ENCOUNTER — Ambulatory Visit: Payer: Medicaid Other | Admitting: Gastroenterology

## 2023-12-06 NOTE — Progress Notes (Deleted)
 Wyline Mood MD, MRCP(U.K) [address]  [address]  [place], [place] [address]  Main: [phone]  Fax: [phone]   Gastroenterology Consultation  Referring Provider:     Cydney Ok* Primary Care Physician:  Bary Leriche, MD Primary Gastroenterologist:  Dr. Wyline Mood  Reason for Consultation:    Hernia        HPI:   [name] is a 43 y.o. y/o female referred for consultation & management  by Dr. Mayford Knife, Vanessa Kick, MD.       Past Medical History:  Diagnosis Date   Allergic rhinitis 09/06/2015   Anemia    Anginal pain (HCC)    Anxiety    COVID    1st time 2021, 2nd time this year 2023  Pt states she was not hospitalized. States she was pretty sick   Deaf, left    Depression    Excess, menstruation 09/06/2015   Gestational diabetes 2008   glyburide this pregnancy   History of chicken pox    History of COVID-19 03/11/2020   April 2021   History of gestational diabetes 04/16/2016   Early a1c neg   History of kidney stones    Hydronephrosis, right 11/08/2012   Insomnia 09/06/2015   Kidney stones 2013   13 passed   Nephrolithiasis 11/29/2012   Pregnancy induced hypertension    labetolol   Preterm labor    delivery    Past Surgical History:  Procedure Laterality Date   CESAREAN SECTION  2006   DILATION AND EVACUATION N/A 03/09/2022   Procedure: DILATATION AND EVACUATION;  Surgeon: Hermina Staggers, MD;  Location: [facility] OR;  Service: Gynecology;  Laterality: N/A;   DILITATION & CURRETTAGE/HYSTROSCOPY WITH HYDROTHERMAL ABLATION N/A 11/16/2023   Procedure: DILATATION & CURETTAGE/HYSTEROSCOPY WITH HYDROTHERMAL ABLATION EXCISION OF ENDOCERVICAL POLYP, PAPSMERE;  Surgeon: Reva Bores, MD;  Location: [facility];  Service: Gynecology;  Laterality: N/A;   LAPAROSCOPIC BILATERAL SALPINGECTOMY Bilateral 11/16/2023   Procedure: LAPAROSCOPIC BILATERAL SALPINGECTOMY;  Surgeon: Reva Bores, MD;  Location[facility];  Service: Gynecology;  Laterality: Bilateral;   WISDOM TOOTH EXTRACTION     x4    Prior to Admission medications   Medication Sig Start Date End Date Taking? Authorizing Provider  ALPRAZolam Prudy Feeler) 1 MG tablet Take 1 mg by mouth 2 (two) times daily as needed for anxiety.    [provider]  buPROPion (WELLBUTRIN XL) 150 MG 24 hr tablet Take 150 mg by mouth daily.    [provider]  ibuprofen (ADVIL) 600 MG tablet Take 1 tablet (600 mg total) by mouth every 6 (six) hours as needed. 11/16/23   Reva Bores, MD  Multiple Vitamins-Minerals (MULTIVITAMIN WITH MINERALS) tablet Take 1 tablet by mouth daily.    [provider]  norethindrone (AYGESTIN) 5 MG tablet Take 1 tablet (5 mg total) by mouth daily. 08/16/23   Reva Bores, MD  oxyCODONE (OXY IR/ROXICODONE) 5 MG immediate release tablet Take 1 tablet (5 mg total) by mouth every 6 (six) hours as needed for severe pain (pain score 7-10). 11/16/23   Reva Bores, MD    Family History  Problem Relation Age of Onset   Schizophrenia Maternal Grandmother    Atrial fibrillation Mother    Depression Mother    Anxiety disorder Mother    Mental illness Father    Heart attack Maternal Grandfather    Atrial fibrillation Paternal Grandfather    Diabetes  Paternal Grandfather    Heart attack Paternal Grandfather    Allergies Sister      Social History   Tobacco Use   Smoking status: Never    Passive exposure: Never   Smokeless tobacco: Never  Vaping Use   Vaping status: Never Used  Substance Use Topics   Alcohol use: No    Alcohol/week: 0.0 standard drinks of alcohol   Drug use: No    Allergies as of 12/06/2023 - Review Complete 11/16/2023  Allergen Reaction Noted   Iodine Anaphylaxis 09/06/2015   Ivp dye [iodinated contrast media] Anaphylaxis 03/16/2012   Other Cough 11/16/2023   Phenergan fortis [promethazine] Itching and Other (See Comments) 03/16/2012   Venofer [iron sucrose]  Other (See Comments) 03/25/2023    Review of Systems:    All systems reviewed and negative except where noted in HPI.   Physical Exam:  LMP  (LMP Unknown)  No LMP recorded (lmp unknown). Psych:  Alert and cooperative. Normal mood and affect. General:   Alert,  Well-developed, well-nourished, pleasant and cooperative in NAD Head:  Normocephalic and atraumatic. Eyes:  Sclera clear, no icterus.   Conjunctiva pink. Ears:  Normal auditory acuity. Neck:  Supple; no masses or thyromegaly. Lungs:  Respirations even and unlabored.  Clear throughout to auscultation.   No wheezes, crackles, or rhonchi. No acute distress. Heart:  Regular rate and rhythm; no murmurs, clicks, rubs, or gallops. Abdomen:  Normal bowel sounds.  No bruits.  Soft, non-tender and non-distended without masses, hepatosplenomegaly or hernias noted.  No guarding or rebound tenderness.    Neurologic:  Alert and oriented x3;  grossly normal neurologically. Psych:  Alert and cooperative. Normal mood and affect.  Imaging Studies: No results found.  Assessment and Plan:   [name] is a 43 y.o. y/o female has been referred for ***  Follow up in ***  Dr Wyline Mood MD,MRCP(U.K)    BP check ***

## 2023-12-08 ENCOUNTER — Encounter: Payer: Medicaid Other | Admitting: Family Medicine

## 2024-03-31 ENCOUNTER — Other Ambulatory Visit: Payer: Self-pay | Admitting: Emergency Medicine

## 2024-03-31 DIAGNOSIS — R109 Unspecified abdominal pain: Secondary | ICD-10-CM

## 2024-04-05 ENCOUNTER — Ambulatory Visit
Admission: RE | Admit: 2024-04-05 | Discharge: 2024-04-05 | Disposition: A | Discharge status: Home / Self Care | Source: Ambulatory Visit | Attending: Emergency Medicine | Admitting: Emergency Medicine

## 2024-04-05 DIAGNOSIS — R109 Unspecified abdominal pain: Secondary | ICD-10-CM

## 2024-06-07 DIAGNOSIS — N3001 Acute cystitis with hematuria: Secondary | ICD-10-CM | POA: Diagnosis not present

## 2024-06-07 DIAGNOSIS — Z6841 Body Mass Index (BMI) 40.0 and over, adult: Secondary | ICD-10-CM | POA: Diagnosis not present

## 2024-06-07 DIAGNOSIS — R030 Elevated blood-pressure reading, without diagnosis of hypertension: Secondary | ICD-10-CM | POA: Diagnosis not present

## 2024-06-08 ENCOUNTER — Emergency Department (HOSPITAL_COMMUNITY)

## 2024-06-08 ENCOUNTER — Emergency Department (HOSPITAL_COMMUNITY)
Admission: EM | Admit: 2024-06-08 | Discharge: 2024-06-09 | Disposition: A | Discharge status: Home / Self Care | Attending: Emergency Medicine | Admitting: Emergency Medicine

## 2024-06-08 ENCOUNTER — Encounter (HOSPITAL_COMMUNITY): Payer: Self-pay | Admitting: Emergency Medicine

## 2024-06-08 DIAGNOSIS — R509 Fever, unspecified: Secondary | ICD-10-CM | POA: Diagnosis not present

## 2024-06-08 DIAGNOSIS — N281 Cyst of kidney, acquired: Secondary | ICD-10-CM | POA: Diagnosis not present

## 2024-06-08 DIAGNOSIS — N390 Urinary tract infection, site not specified: Secondary | ICD-10-CM | POA: Diagnosis not present

## 2024-06-08 DIAGNOSIS — R109 Unspecified abdominal pain: Secondary | ICD-10-CM | POA: Diagnosis not present

## 2024-06-08 DIAGNOSIS — R319 Hematuria, unspecified: Secondary | ICD-10-CM | POA: Insufficient documentation

## 2024-06-08 DIAGNOSIS — K429 Umbilical hernia without obstruction or gangrene: Secondary | ICD-10-CM | POA: Diagnosis not present

## 2024-06-08 LAB — CBC WITH DIFFERENTIAL/PLATELET
Abs Immature Granulocytes: 0.01 K/uL (ref 0.00–0.07)
Basophils Absolute: 0 K/uL (ref 0.0–0.1)
Basophils Relative: 0 %
Eosinophils Absolute: 0 K/uL (ref 0.0–0.5)
Eosinophils Relative: 0 %
HCT: 46.7 % — ABNORMAL HIGH (ref 36.0–46.0)
Hemoglobin: 15 g/dL (ref 12.0–15.0)
Immature Granulocytes: 0 %
Lymphocytes Relative: 15 %
Lymphs Abs: 1 K/uL (ref 0.7–4.0)
MCH: 28.4 pg (ref 26.0–34.0)
MCHC: 32.1 g/dL (ref 30.0–36.0)
MCV: 88.3 fL (ref 80.0–100.0)
Monocytes Absolute: 0.4 K/uL (ref 0.1–1.0)
Monocytes Relative: 6 %
Neutro Abs: 5.2 K/uL (ref 1.7–7.7)
Neutrophils Relative %: 79 %
Platelets: 205 K/uL (ref 150–400)
RBC: 5.29 MIL/uL — ABNORMAL HIGH (ref 3.87–5.11)
RDW: 13 % (ref 11.5–15.5)
WBC: 6.7 K/uL (ref 4.0–10.5)
nRBC: 0 % (ref 0.0–0.2)

## 2024-06-08 LAB — COMPREHENSIVE METABOLIC PANEL WITH GFR
ALT: 17 U/L (ref 0–44)
AST: 19 U/L (ref 15–41)
Albumin: 3.9 g/dL (ref 3.5–5.0)
Alkaline Phosphatase: 45 U/L (ref 38–126)
Anion gap: 13 (ref 5–15)
BUN: 14 mg/dL (ref 6–20)
CO2: 22 mmol/L (ref 22–32)
Calcium: 9.3 mg/dL (ref 8.9–10.3)
Chloride: 102 mmol/L (ref 98–111)
Creatinine, Ser: 1.27 mg/dL — ABNORMAL HIGH (ref 0.44–1.00)
GFR, Estimated: 53 mL/min — ABNORMAL LOW (ref >60)
Glucose, Bld: 106 mg/dL — ABNORMAL HIGH (ref 70–99)
Potassium: 3.7 mmol/L (ref 3.5–5.1)
Sodium: 137 mmol/L (ref 135–145)
Total Bilirubin: 0.9 mg/dL (ref 0.0–1.2)
Total Protein: 7.2 g/dL (ref 6.5–8.1)

## 2024-06-08 LAB — URINALYSIS, W/ REFLEX TO CULTURE (INFECTION SUSPECTED)
Bilirubin Urine: NEGATIVE
Glucose, UA: NEGATIVE mg/dL
Ketones, ur: NEGATIVE mg/dL
Nitrite: NEGATIVE
Protein, ur: NEGATIVE mg/dL
Specific Gravity, Urine: 1.009 (ref 1.005–1.030)
pH: 7 (ref 5.0–8.0)

## 2024-06-08 LAB — HCG, SERUM, QUALITATIVE: Preg, Serum: NEGATIVE

## 2024-06-08 LAB — PROTIME-INR
INR: 1.1 (ref 0.8–1.2)
Prothrombin Time: 14.4 s (ref 11.4–15.2)

## 2024-06-08 LAB — I-STAT CG4 LACTIC ACID, ED: Lactic Acid, Venous: 0.7 mmol/L (ref 0.5–1.9)

## 2024-06-08 MED ORDER — ACETAMINOPHEN 325 MG PO TABS
650.0000 mg | ORAL_TABLET | Freq: Four times a day (QID) | ORAL | Status: DC | PRN
  Administered 2024-06-08: 650 mg via ORAL
  Filled 2024-06-08: qty 2

## 2024-06-08 MED ORDER — OXYCODONE-ACETAMINOPHEN 5-325 MG PO TABS
1.0000 | ORAL_TABLET | Freq: Once | ORAL | Status: AC
  Administered 2024-06-08: 1 via ORAL
  Filled 2024-06-08: qty 1

## 2024-06-08 MED ORDER — ONDANSETRON 4 MG PO TBDP
8.0000 mg | ORAL_TABLET | Freq: Once | ORAL | Status: AC
  Administered 2024-06-08: 8 mg via ORAL
  Filled 2024-06-08: qty 2

## 2024-06-08 NOTE — ED Triage Notes (Signed)
 Pt reports last week she noticed some burning with urination. Seen at PCP Monday and had UA done at [facility]. Bactrim started. Pt began having fever, flank pain, back pain, abd pain last night.

## 2024-06-08 NOTE — ED Provider Notes (Signed)
 [place] EMERGENCY DEPARTMENT AT [facility] Provider Note   CSN: [id number] Arrival date & time: 06/08/24  8175     Patient presents with: Urinary Tract Infection and Fever   [name] is a 43 y.o. female past medical history significant for kidney stone, GERD, and anxiety presents today for dysuria.  Patient noted she had dysuria last week and was seen by her PCP on Monday.  Patient had UA done at [facility] and was started on Bactrim.  She has taken 3 doses of her Bactrim.  Patient began having fever, flank pain, back pain, and abdominal pain last night.    Urinary Tract Infection Associated symptoms: abdominal pain, fever, flank pain and nausea   Fever Associated symptoms: dysuria and nausea        Prior to Admission medications   Medication Sig Start Date End Date Taking? Authorizing Provider  ALPRAZolam  (XANAX ) 1 MG tablet Take 1 mg by mouth 2 (two) times daily as needed for anxiety.    [provider]  buPROPion  (WELLBUTRIN  XL) 150 MG 24 hr tablet Take 150 mg by mouth daily.    [provider]  ibuprofen  (ADVIL ) 600 MG tablet Take 1 tablet (600 mg total) by mouth every 6 (six) hours as needed. 11/16/23   Fredirick Glenys RAMAN, MD  Multiple Vitamins-Minerals (MULTIVITAMIN WITH MINERALS) tablet Take 1 tablet by mouth daily.    [provider]  norethindrone  (AYGESTIN ) 5 MG tablet Take 1 tablet (5 mg total) by mouth daily. 08/16/23   Fredirick Glenys RAMAN, MD  oxyCODONE  (OXY IR/ROXICODONE ) 5 MG immediate release tablet Take 1 tablet (5 mg total) by mouth every 6 (six) hours as needed for severe pain (pain score 7-10). 11/16/23   Fredirick Glenys RAMAN, MD    Allergies: Iodine , Ivp dye [iodinated contrast media], Other, Phenergan fortis [promethazine], and Venofer  [iron  sucrose]    Review of Systems  Constitutional:  Positive for fever.  Gastrointestinal:  Positive for abdominal pain and nausea.  Genitourinary:  Positive for dysuria and flank pain.   Musculoskeletal:  Positive for back pain.    Updated Vital Signs BP (!) 156/92 (BP Location: Right Arm)   Pulse 89   Temp 98.9 F (37.2 C) (Oral)   Resp 18   Ht 6' 1 (1.854 m)   Wt (!) 143 kg   SpO2 98%   BMI 41.59 kg/m   Physical Exam Vitals and nursing note reviewed.  Constitutional:      General: She is not in acute distress.    Appearance: Normal appearance. She is well-developed. She is not ill-appearing.  HENT:     Head: Normocephalic and atraumatic.     Right Ear: External ear normal.     Left Ear: External ear normal.     Nose: Nose normal.     Mouth/Throat:     Mouth: Mucous membranes are moist.     Pharynx: Oropharynx is clear.  Eyes:     Extraocular Movements: Extraocular movements intact.     Conjunctiva/sclera: Conjunctivae normal.  Cardiovascular:     Rate and Rhythm: Normal rate and regular rhythm.     Pulses: Normal pulses.     Heart sounds: Normal heart sounds. No murmur heard.    Comments: Borderline tachycardic on exam Pulmonary:     Effort: Pulmonary effort is normal. No respiratory distress.     Breath sounds: Normal breath sounds.  Abdominal:     Palpations: Abdomen is soft.     Tenderness: There  is no abdominal tenderness. There is right CVA tenderness and left CVA tenderness.     Hernia: A hernia is present.     Comments: Easily reducible periumbilical hernia noted on exam.  Musculoskeletal:        General: No swelling.     Cervical back: Neck supple.  Skin:    General: Skin is warm and dry.     Capillary Refill: Capillary refill takes less than 2 seconds.  Neurological:     General: No focal deficit present.     Mental Status: She is alert and oriented to person, place, and time.  Psychiatric:        Mood and Affect: Mood normal.     (all labs ordered are listed, but only abnormal results are displayed) Labs Reviewed  COMPREHENSIVE METABOLIC PANEL WITH GFR - Abnormal; Notable for the following components:      Result Value    Glucose, Bld 106 (*)    Creatinine, Ser 1.27 (*)    GFR, Estimated 53 (*)    All other components within normal limits  CBC WITH DIFFERENTIAL/PLATELET - Abnormal; Notable for the following components:   RBC 5.29 (*)    HCT 46.7 (*)    All other components within normal limits  URINALYSIS, W/ REFLEX TO CULTURE (INFECTION SUSPECTED) - Abnormal; Notable for the following components:   APPearance HAZY (*)    Hgb urine dipstick MODERATE (*)    Leukocytes,Ua MODERATE (*)    Bacteria, UA FEW (*)    All other components within normal limits  CULTURE, BLOOD (ROUTINE X 2)  CULTURE, BLOOD (ROUTINE X 2)  HCG, SERUM, QUALITATIVE  PROTIME-INR  I-STAT CG4 LACTIC ACID, ED  I-STAT CG4 LACTIC ACID, ED    EKG: None  Radiology: CT Renal Stone Study Result Date: 06/08/2024 CLINICAL DATA:  Abdominal/flank pain, stone suspected, hematuria history of poly cystic kidney disease EXAM: CT ABDOMEN AND PELVIS WITHOUT CONTRAST TECHNIQUE: Multidetector CT imaging of the abdomen and pelvis was performed following the standard protocol without IV contrast. RADIATION DOSE REDUCTION: This exam was performed according to the departmental dose-optimization program which includes automated exposure control, adjustment of the mA and/or kV according to patient size and/or use of iterative reconstruction technique. COMPARISON:  Renal ultrasound 04/05/2024 and CT abdomen pelvis 10/28/2010 FINDINGS: Lower chest: No acute abnormality. Hepatobiliary: Hepatic steatosis. Small hypoattenuating lesions in the right hepatic lobe are not well characterized without IV contrast but likely represent benign cysts. Normal gallbladder. No biliary dilation. Pancreas: Unremarkable. Spleen: Unremarkable. Adrenals/Urinary Tract: Normal adrenal glands. Innumerable cysts in both kidneys. Most of these are too small to definitively characterize. No specific follow-up recommended. Punctate calculi versus cystic mural calcifications in the bilateral  kidneys. No hydronephrosis. Bladder is unremarkable. Stomach/Bowel: Normal caliber large and small bowel. No bowel wall thickening. The appendix is normal.Stomach is within normal limits. Vascular/Lymphatic: No significant vascular findings are present. No enlarged abdominal or pelvic lymph nodes. Reproductive: Unremarkable. Other: No free intraperitoneal fluid or air. Fat containing periumbilical hernia with mild associated stranding. The abdominal wall defect measures 2.8 cm. Musculoskeletal: No acute fracture. IMPRESSION: 1. Innumerable cysts in both kidneys consistent with history of polycystic kidney disease. Evaluation for solid renal mass is limited given the extensive cystic change and lack of IV contrast. 2. Punctate calculi versus cystic mural calcifications in the bilateral kidneys. No hydronephrosis. 3. Fat containing periumbilical hernia with mild associated stranding. Correlate for strangulation/incarceration. 4. Hepatic steatosis. Electronically Signed   By: Norman Charletta HERO.D.  On: 06/08/2024 23:44   DG Chest Port 1 View if patient is in a treatment room. Result Date: 06/08/2024 CLINICAL DATA:  UTI, fever.  Suspected sepsis. EXAM: PORTABLE CHEST 1 VIEW COMPARISON:  11/26/2020 FINDINGS: The heart size and mediastinal contours are within normal limits. Both lungs are clear. The visualized skeletal structures are unremarkable. IMPRESSION: Normal study. Electronically Signed   By: Franky Crease M.D.   On: 06/08/2024 20:02     Procedures   Medications Ordered in the ED  acetaminophen  (TYLENOL ) tablet 650 mg (650 mg Oral Given 06/08/24 1908)  oxyCODONE -acetaminophen  (PERCOCET/ROXICET) 5-325 MG per tablet 1 tablet (1 tablet Oral Given 06/08/24 2356)  ondansetron  (ZOFRAN -ODT) disintegrating tablet 8 mg (8 mg Oral Given 06/08/24 2355)                                    Medical Decision Making Amount and/or Complexity of Data Reviewed Labs: ordered. Radiology: ordered.  Risk OTC  drugs. Prescription drug management.   This patient presents to the ED for concern of flank pain, fever, dysuria, this involves an extensive number of treatment options, and is a complaint that carries with it a high risk of complications and morbidity.  The differential diagnosis includes septic stone, kidney stone, UTI, pyelonephritis   Additional history obtained:  Additional history obtained from EMR External records from outside source obtained and reviewed including OB/GYN notes   Lab Tests:  I Ordered, and personally interpreted labs.  The pertinent results include: CBC unremarkable, elevated creatinine at 1.27 which is elevated from a baseline of approximately 0.9, lactic acid 0.7, pro time and INR WNL, negative pregnancy, UA with moderate hemoglobin, moderate leukocytes, few bacteria, 6-10 RBCs, 11-20 squamous epithelials, 11-20 WBCs   Imaging Studies ordered:  I ordered imaging studies including chest x-ray I independently visualized and interpreted imaging which showed no acute findings I agree with the radiologist interpretation CT renal stone study punctate calculi versus cystic mural calcifications in bilateral kidneys.  Fat-containing periumbilical hernia with mild associated stranding.  Correlate for strangulation/incarceration.   Problem List / ED Course / Critical interventions / Medication management I ordered medication including Tylenol , Percocet, Zofran  Reevaluation of the patient after these medicines showed that the patient symptoms improved I have reviewed the patients home medicines and have made adjustments as needed  Test / Admission - Considered:  Considered for admission further workup however patient's vital signs, physical exam, labs, and imaging are reassuring.  Patient advised to continue taking Bactrim and to follow-up with primary care to ensure resolution of elevated creatinine.  Patient given return precautions.  I feel patient is safe for  discharge at this time.     Final diagnoses:  Urinary tract infection with hematuria, site unspecified    ED Discharge Orders     None          Francis Ileana LOISE DEVONNA 06/09/24 0458    Yolande Lamar BROCKS, MD 06/09/24 1101

## 2024-06-08 NOTE — ED Provider Notes (Incomplete)
 [place] EMERGENCY DEPARTMENT AT [facility] Provider Note   CSN: [id number] Arrival date & time: 06/08/24  8175     Patient presents with: Urinary Tract Infection and Fever   [name] is a 43 y.o. female past medical history significant for kidney stone, GERD, and anxiety presents today for dysuria.  Patient noted she had dysuria last week and was seen by her PCP on Monday.  Patient had UA done at [facility] and was started on Bactrim.  She has taken 3 doses of her Bactrim.  Patient began having fever, flank pain, back pain, and abdominal pain last night.  {Add pertinent medical, surgical, social history, OB history to YEP:67052}  Urinary Tract Infection Associated symptoms: abdominal pain, fever, flank pain and nausea   Fever Associated symptoms: dysuria and nausea        Prior to Admission medications   Medication Sig Start Date End Date Taking? Authorizing Provider  ALPRAZolam  (XANAX ) 1 MG tablet Take 1 mg by mouth 2 (two) times daily as needed for anxiety.    [provider]  buPROPion  (WELLBUTRIN  XL) 150 MG 24 hr tablet Take 150 mg by mouth daily.    [provider]  ibuprofen  (ADVIL ) 600 MG tablet Take 1 tablet (600 mg total) by mouth every 6 (six) hours as needed. 11/16/23   Fredirick Glenys RAMAN, MD  Multiple Vitamins-Minerals (MULTIVITAMIN WITH MINERALS) tablet Take 1 tablet by mouth daily.    [provider]  norethindrone  (AYGESTIN ) 5 MG tablet Take 1 tablet (5 mg total) by mouth daily. 08/16/23   Fredirick Glenys RAMAN, MD  oxyCODONE  (OXY IR/ROXICODONE ) 5 MG immediate release tablet Take 1 tablet (5 mg total) by mouth every 6 (six) hours as needed for severe pain (pain score 7-10). 11/16/23   Fredirick Glenys RAMAN, MD    Allergies: Iodine , Ivp dye [iodinated contrast media], Other, Phenergan fortis [promethazine], and Venofer  [iron  sucrose]    Review of Systems  Constitutional:  Positive for fever.  Gastrointestinal:  Positive for abdominal pain and  nausea.  Genitourinary:  Positive for dysuria and flank pain.  Musculoskeletal:  Positive for back pain.    Updated Vital Signs BP (!) 156/89 (BP Location: Right Arm)   Pulse 96   Temp 99.1 F (37.3 C) (Axillary)   Resp 20   Ht 6' 1 (1.854 m)   Wt (!) 143 kg   SpO2 99%   BMI 41.59 kg/m   Physical Exam Vitals and nursing note reviewed.  Constitutional:      General: She is not in acute distress.    Appearance: Normal appearance. She is well-developed. She is not ill-appearing.  HENT:     Head: Normocephalic and atraumatic.     Right Ear: External ear normal.     Left Ear: External ear normal.     Nose: Nose normal.     Mouth/Throat:     Mouth: Mucous membranes are moist.     Pharynx: Oropharynx is clear.  Eyes:     Extraocular Movements: Extraocular movements intact.     Conjunctiva/sclera: Conjunctivae normal.  Cardiovascular:     Rate and Rhythm: Normal rate and regular rhythm.     Pulses: Normal pulses.     Heart sounds: Normal heart sounds. No murmur heard.    Comments: Borderline tachycardic on exam Pulmonary:     Effort: Pulmonary effort is normal. No respiratory distress.     Breath sounds: Normal breath sounds.  Abdominal:     Palpations:  Abdomen is soft.     Tenderness: There is no abdominal tenderness. There is right CVA tenderness and left CVA tenderness.  Musculoskeletal:        General: No swelling.     Cervical back: Neck supple.  Skin:    General: Skin is warm and dry.     Capillary Refill: Capillary refill takes less than 2 seconds.  Neurological:     General: No focal deficit present.     Mental Status: She is alert and oriented to person, place, and time.  Psychiatric:        Mood and Affect: Mood normal.     (all labs ordered are listed, but only abnormal results are displayed) Labs Reviewed  COMPREHENSIVE METABOLIC PANEL WITH GFR - Abnormal; Notable for the following components:      Result Value   Glucose, Bld 106 (*)    Creatinine,  Ser 1.27 (*)    GFR, Estimated 53 (*)    All other components within normal limits  CBC WITH DIFFERENTIAL/PLATELET - Abnormal; Notable for the following components:   RBC 5.29 (*)    HCT 46.7 (*)    All other components within normal limits  URINALYSIS, W/ REFLEX TO CULTURE (INFECTION SUSPECTED) - Abnormal; Notable for the following components:   APPearance HAZY (*)    Hgb urine dipstick MODERATE (*)    Leukocytes,Ua MODERATE (*)    Bacteria, UA FEW (*)    All other components within normal limits  CULTURE, BLOOD (ROUTINE X 2)  CULTURE, BLOOD (ROUTINE X 2)  HCG, SERUM, QUALITATIVE  PROTIME-INR  I-STAT CG4 LACTIC ACID, ED  I-STAT CG4 LACTIC ACID, ED    EKG: None  Radiology: DG Chest Port 1 View if patient is in a treatment room. Result Date: 06/08/2024 CLINICAL DATA:  UTI, fever.  Suspected sepsis. EXAM: PORTABLE CHEST 1 VIEW COMPARISON:  11/26/2020 FINDINGS: The heart size and mediastinal contours are within normal limits. Both lungs are clear. The visualized skeletal structures are unremarkable. IMPRESSION: Normal study. Electronically Signed   By: Franky Crease M.D.   On: 06/08/2024 20:02    {Document cardiac monitor, telemetry assessment procedure when appropriate:32947} Procedures   Medications Ordered in the ED  acetaminophen  (TYLENOL ) tablet 650 mg (650 mg Oral Given 06/08/24 1908)      {Click here for ABCD2, HEART and other calculators REFRESH Note before signing:1}                              Medical Decision Making Amount and/or Complexity of Data Reviewed Labs: ordered. Radiology: ordered.  Risk OTC drugs.   This patient presents to the ED for concern of flank pain, fever, dysuria, this involves an extensive number of treatment options, and is a complaint that carries with it a high risk of complications and morbidity.  The differential diagnosis includes septic stone, kidney stone, UTI, pyelonephritis   Additional history obtained:  Additional history  obtained from EMR External records from outside source obtained and reviewed including OB/GYN notes   Lab Tests:  I Ordered, and personally interpreted labs.  The pertinent results include: CBC unremarkable, elevated creatinine at 1.27 which is elevated from a baseline of approximately 0.9, lactic acid 0.7, pro time and INR WNL, negative pregnancy, UA with moderate hemoglobin, moderate leukocytes, few bacteria, 6-10 RBCs, 11-20 squamous epithelials, 11-20 WBCs   Imaging Studies ordered:  I ordered imaging studies including chest x-ray I independently visualized and  interpreted imaging which showed no acute findings I agree with the radiologist interpretation CT renal stone study punctate calculi versus cystic mural calcifications in bilateral kidneys.  Fat-containing periumbilical hernia with mild associated stranding.  Correlate for strangulation/incarceration.   Problem List / ED Course / Critical interventions / Medication management I ordered medication including Tylenol , Percocet, Zofran  Reevaluation of the patient after these medicines showed that the patient symptoms improved I have reviewed the patients home medicines and have made adjustments as needed  Consultations Obtained:  I requested consultation with the ***,  and discussed lab and imaging findings as well as pertinent plan - they recommend: ***  Test / Admission - Considered:  ***   {Document critical care time when appropriate  Document review of labs and clinical decision tools ie CHADS2VASC2, etc  Document your independent review of radiology images and any outside records  Document your discussion with family members, caretakers and with consultants  Document social determinants of health affecting pt's care  Document your decision making why or why not admission, treatments were needed:32947:::1}   Final diagnoses:  None    ED Discharge Orders     None

## 2024-06-09 NOTE — Discharge Instructions (Addendum)
 Today you were seen for urinary tract infection.  Please continue taking 1 g of Tylenol  every 8 hours for pain and continue taking your Bactrim.  Please follow-up with your PCP in the upcoming days if your symptoms persist for further evaluation and workup and to ensure that your kidney function goes back to baseline.  Thank you for letting us  treat you today. After reviewing your labs and imaging, I feel you are safe to go home. Please follow up with your PCP in the next several days and provide them with your records from this visit. Return to the Emergency Room if pain becomes severe or symptoms worsen.

## 2024-06-13 LAB — CULTURE, BLOOD (ROUTINE X 2)
Culture: NO GROWTH
Culture: NO GROWTH
Special Requests: ADEQUATE
Special Requests: ADEQUATE

## 2024-09-01 DIAGNOSIS — Q613 Polycystic kidney, unspecified: Secondary | ICD-10-CM | POA: Diagnosis not present

## 2024-09-01 DIAGNOSIS — Q619 Cystic kidney disease, unspecified: Secondary | ICD-10-CM | POA: Diagnosis not present
